# Patient Record
Sex: Female | Born: 1950 | Race: White | Hispanic: No | Smoking: Former smoker
Health system: Southern US, Community
[De-identification: ages and names within clinical notes are randomized; demographics above are authoritative.]

## PROBLEM LIST (undated history)

## (undated) ENCOUNTER — Emergency Department (HOSPITAL_BASED_OUTPATIENT_CLINIC_OR_DEPARTMENT_OTHER): Admission: EM | Payer: 59 | Source: Home / Self Care

## (undated) DIAGNOSIS — R600 Localized edema: Secondary | ICD-10-CM

## (undated) DIAGNOSIS — L02519 Cutaneous abscess of unspecified hand: Secondary | ICD-10-CM

## (undated) DIAGNOSIS — K56609 Unspecified intestinal obstruction, unspecified as to partial versus complete obstruction: Secondary | ICD-10-CM

## (undated) DIAGNOSIS — N289 Disorder of kidney and ureter, unspecified: Secondary | ICD-10-CM

## (undated) DIAGNOSIS — L03019 Cellulitis of unspecified finger: Secondary | ICD-10-CM

## (undated) DIAGNOSIS — F3289 Other specified depressive episodes: Secondary | ICD-10-CM

## (undated) DIAGNOSIS — M109 Gout, unspecified: Secondary | ICD-10-CM

## (undated) DIAGNOSIS — L03119 Cellulitis of unspecified part of limb: Secondary | ICD-10-CM

## (undated) DIAGNOSIS — F329 Major depressive disorder, single episode, unspecified: Secondary | ICD-10-CM

## (undated) DIAGNOSIS — R739 Hyperglycemia, unspecified: Principal | ICD-10-CM

## (undated) DIAGNOSIS — K219 Gastro-esophageal reflux disease without esophagitis: Secondary | ICD-10-CM

## (undated) DIAGNOSIS — L02619 Cutaneous abscess of unspecified foot: Secondary | ICD-10-CM

## (undated) DIAGNOSIS — E669 Obesity, unspecified: Secondary | ICD-10-CM

## (undated) DIAGNOSIS — G4700 Insomnia, unspecified: Secondary | ICD-10-CM

## (undated) DIAGNOSIS — E785 Hyperlipidemia, unspecified: Secondary | ICD-10-CM

## (undated) DIAGNOSIS — M1990 Unspecified osteoarthritis, unspecified site: Secondary | ICD-10-CM

## (undated) DIAGNOSIS — I10 Essential (primary) hypertension: Secondary | ICD-10-CM

## (undated) HISTORY — DX: Cellulitis of unspecified finger: L02.519

## (undated) HISTORY — DX: Disorder of kidney and ureter, unspecified: N28.9

## (undated) HISTORY — DX: Insomnia, unspecified: G47.00

## (undated) HISTORY — DX: Cutaneous abscess of unspecified foot: L02.619

## (undated) HISTORY — DX: Unspecified intestinal obstruction, unspecified as to partial versus complete obstruction: K56.609

## (undated) HISTORY — DX: Cutaneous abscess of unspecified foot: L03.119

## (undated) HISTORY — DX: Gastro-esophageal reflux disease without esophagitis: K21.9

## (undated) HISTORY — DX: Hyperlipidemia, unspecified: E78.5

## (undated) HISTORY — DX: Gout, unspecified: M10.9

## (undated) HISTORY — DX: Localized edema: R60.0

## (undated) HISTORY — DX: Other specified depressive episodes: F32.89

## (undated) HISTORY — DX: Hyperglycemia, unspecified: R73.9

## (undated) HISTORY — DX: Cutaneous abscess of unspecified hand: L03.019

## (undated) HISTORY — DX: Unspecified osteoarthritis, unspecified site: M19.90

## (undated) HISTORY — DX: Major depressive disorder, single episode, unspecified: F32.9

## (undated) HISTORY — DX: Obesity, unspecified: E66.9

---

## 1969-12-23 HISTORY — PX: ECTOPIC PREGNANCY SURGERY: SHX613

## 1975-12-24 HISTORY — PX: CHOLECYSTECTOMY: SHX55

## 1996-12-23 HISTORY — PX: TOTAL HIP ARTHROPLASTY: SHX124

## 2000-12-23 HISTORY — PX: APPENDECTOMY: SHX54

## 2000-12-23 HISTORY — PX: ABDOMINAL HYSTERECTOMY: SHX81

## 2005-12-23 LAB — HM COLONOSCOPY: HM Colonoscopy: NORMAL

## 2006-03-11 ENCOUNTER — Ambulatory Visit: Payer: Self-pay | Admitting: Family Medicine

## 2006-03-14 ENCOUNTER — Encounter: Admission: RE | Admit: 2006-03-14 | Discharge: 2006-03-14 | Payer: Self-pay | Admitting: Family Medicine

## 2006-03-31 ENCOUNTER — Ambulatory Visit: Payer: Self-pay | Admitting: Gastroenterology

## 2006-05-13 ENCOUNTER — Ambulatory Visit: Payer: Self-pay | Admitting: Gastroenterology

## 2006-05-27 ENCOUNTER — Ambulatory Visit: Payer: Self-pay | Admitting: Family Medicine

## 2006-07-12 ENCOUNTER — Encounter: Admission: RE | Admit: 2006-07-12 | Discharge: 2006-07-12 | Payer: Self-pay | Admitting: Family Medicine

## 2007-03-16 ENCOUNTER — Emergency Department (HOSPITAL_COMMUNITY): Admission: EM | Admit: 2007-03-16 | Discharge: 2007-03-16 | Payer: Self-pay | Admitting: Family Medicine

## 2007-06-30 ENCOUNTER — Ambulatory Visit: Payer: Self-pay | Admitting: Family Medicine

## 2007-06-30 LAB — CONVERTED CEMR LAB
ALT: 26 units/L (ref 0–35)
Albumin: 4.1 g/dL (ref 3.5–5.2)
Alkaline Phosphatase: 105 units/L (ref 39–117)
BUN: 23 mg/dL (ref 6–23)
Basophils Absolute: 0 10*3/uL (ref 0.0–0.1)
Basophils Relative: 0.6 % (ref 0.0–1.0)
Bilirubin, Direct: 0.1 mg/dL (ref 0.0–0.3)
Calcium: 9.3 mg/dL (ref 8.4–10.5)
Cholesterol: 150 mg/dL (ref 0–200)
GFR calc non Af Amer: 55 mL/min
HCT: 38.1 % (ref 36.0–46.0)
HDL: 41.7 mg/dL (ref >39.0)
MCHC: 34.7 g/dL (ref 30.0–36.0)
MCV: 89.3 fL (ref 78.0–100.0)
Monocytes Relative: 5.9 % (ref 3.0–11.0)
Neutro Abs: 3.2 10*3/uL (ref 1.4–7.7)
Neutrophils Relative %: 53.9 % (ref 43.0–77.0)
RDW: 12.9 % (ref 11.5–14.6)
Total Bilirubin: 0.8 mg/dL (ref 0.3–1.2)
Total CHOL/HDL Ratio: 3.6
Triglycerides: 144 mg/dL (ref 0–149)
VLDL: 29 mg/dL (ref 0–40)

## 2007-09-04 DIAGNOSIS — F329 Major depressive disorder, single episode, unspecified: Secondary | ICD-10-CM

## 2007-09-04 DIAGNOSIS — I10 Essential (primary) hypertension: Secondary | ICD-10-CM

## 2007-11-10 ENCOUNTER — Telehealth: Payer: Self-pay | Admitting: Family Medicine

## 2008-01-26 ENCOUNTER — Telehealth: Payer: Self-pay | Admitting: Family Medicine

## 2008-02-04 ENCOUNTER — Telehealth: Payer: Self-pay | Admitting: Family Medicine

## 2008-02-09 ENCOUNTER — Telehealth: Payer: Self-pay | Admitting: Family Medicine

## 2008-02-10 ENCOUNTER — Ambulatory Visit: Payer: Self-pay | Admitting: Family Medicine

## 2008-02-10 DIAGNOSIS — R609 Edema, unspecified: Secondary | ICD-10-CM

## 2008-02-17 ENCOUNTER — Ambulatory Visit: Payer: Self-pay | Admitting: Family Medicine

## 2008-02-18 ENCOUNTER — Encounter: Payer: Self-pay | Admitting: Family Medicine

## 2008-02-23 ENCOUNTER — Telehealth: Payer: Self-pay | Admitting: Family Medicine

## 2008-04-27 ENCOUNTER — Telehealth: Payer: Self-pay | Admitting: Family Medicine

## 2008-04-28 ENCOUNTER — Ambulatory Visit: Payer: Self-pay | Admitting: Family Medicine

## 2008-04-28 ENCOUNTER — Telehealth: Payer: Self-pay | Admitting: Family Medicine

## 2008-05-03 LAB — CONVERTED CEMR LAB
Basophils Absolute: 0 10*3/uL (ref 0.0–0.1)
Basophils Relative: 0.4 % (ref 0.0–1.0)
Eosinophils Absolute: 0.1 10*3/uL (ref 0.0–0.7)
Eosinophils Relative: 2.1 % (ref 0.0–5.0)
HCT: 39.6 % (ref 36.0–46.0)
Lymphocytes Relative: 39.8 % (ref 12.0–46.0)
Monocytes Absolute: 0.4 10*3/uL (ref 0.1–1.0)
Neutro Abs: 3.2 10*3/uL (ref 1.4–7.7)
Platelets: 230 10*3/uL (ref 150–400)
WBC: 6.1 10*3/uL (ref 4.5–10.5)

## 2008-05-19 ENCOUNTER — Emergency Department (HOSPITAL_COMMUNITY): Admission: EM | Admit: 2008-05-19 | Discharge: 2008-05-19 | Payer: Self-pay | Admitting: Family Medicine

## 2008-08-17 ENCOUNTER — Telehealth: Payer: Self-pay | Admitting: Family Medicine

## 2008-08-24 ENCOUNTER — Telehealth: Payer: Self-pay | Admitting: Family Medicine

## 2008-10-27 ENCOUNTER — Telehealth: Payer: Self-pay | Admitting: Family Medicine

## 2008-11-24 ENCOUNTER — Telehealth: Payer: Self-pay | Admitting: Family Medicine

## 2009-01-16 ENCOUNTER — Ambulatory Visit: Payer: Self-pay | Admitting: *Deleted

## 2009-01-16 DIAGNOSIS — E785 Hyperlipidemia, unspecified: Secondary | ICD-10-CM | POA: Insufficient documentation

## 2009-01-16 LAB — CONVERTED CEMR LAB
Basophils Absolute: 0.1 10*3/uL (ref 0.0–0.1)
Calcium: 9.5 mg/dL (ref 8.4–10.5)
Creatinine, Ser: 1.1 mg/dL (ref 0.4–1.2)
GFR calc non Af Amer: 54 mL/min
HCT: 38.5 % (ref 36.0–46.0)
MCHC: 34.6 g/dL (ref 30.0–36.0)
Monocytes Absolute: 3.4 10*3/uL — ABNORMAL HIGH (ref 0.1–1.0)
Neutro Abs: 0.6 10*3/uL — ABNORMAL LOW (ref 1.4–7.7)
Platelets: 164 10*3/uL (ref 150–400)
TSH: 1.31 microintl units/mL (ref 0.35–5.50)
Total CHOL/HDL Ratio: 3.5
Total Protein: 7.1 g/dL (ref 6.0–8.3)
Uric Acid, Serum: 9.1 mg/dL — ABNORMAL HIGH (ref 2.4–7.0)
VLDL: 17 mg/dL (ref 0–40)

## 2009-01-20 ENCOUNTER — Ambulatory Visit (HOSPITAL_BASED_OUTPATIENT_CLINIC_OR_DEPARTMENT_OTHER): Admission: RE | Admit: 2009-01-20 | Discharge: 2009-01-20 | Payer: Self-pay | Admitting: *Deleted

## 2009-01-20 ENCOUNTER — Ambulatory Visit: Payer: Self-pay | Admitting: Diagnostic Radiology

## 2009-02-17 ENCOUNTER — Ambulatory Visit: Payer: Self-pay | Admitting: Internal Medicine

## 2009-02-18 ENCOUNTER — Encounter (INDEPENDENT_AMBULATORY_CARE_PROVIDER_SITE_OTHER): Payer: Self-pay | Admitting: *Deleted

## 2009-02-18 LAB — CONVERTED CEMR LAB: Uric Acid, Serum: 8.3 mg/dL — ABNORMAL HIGH (ref 2.4–7.0)

## 2009-02-19 DIAGNOSIS — M1990 Unspecified osteoarthritis, unspecified site: Secondary | ICD-10-CM | POA: Insufficient documentation

## 2009-02-19 DIAGNOSIS — K219 Gastro-esophageal reflux disease without esophagitis: Secondary | ICD-10-CM

## 2009-06-22 ENCOUNTER — Telehealth: Payer: Self-pay | Admitting: Internal Medicine

## 2009-07-11 ENCOUNTER — Ambulatory Visit: Payer: Self-pay | Admitting: Family Medicine

## 2009-07-11 ENCOUNTER — Ambulatory Visit (HOSPITAL_BASED_OUTPATIENT_CLINIC_OR_DEPARTMENT_OTHER): Admission: RE | Admit: 2009-07-11 | Discharge: 2009-07-11 | Payer: Self-pay | Admitting: Family Medicine

## 2009-07-11 ENCOUNTER — Ambulatory Visit: Payer: Self-pay | Admitting: Diagnostic Radiology

## 2009-07-11 DIAGNOSIS — M25559 Pain in unspecified hip: Secondary | ICD-10-CM

## 2009-07-11 DIAGNOSIS — M545 Low back pain: Secondary | ICD-10-CM

## 2009-07-11 DIAGNOSIS — M5136 Other intervertebral disc degeneration, lumbar region: Secondary | ICD-10-CM

## 2009-08-24 ENCOUNTER — Telehealth (INDEPENDENT_AMBULATORY_CARE_PROVIDER_SITE_OTHER): Payer: Self-pay | Admitting: *Deleted

## 2010-02-21 ENCOUNTER — Ambulatory Visit: Payer: Self-pay | Admitting: Family

## 2010-02-21 DIAGNOSIS — M109 Gout, unspecified: Secondary | ICD-10-CM | POA: Insufficient documentation

## 2010-02-22 ENCOUNTER — Ambulatory Visit (HOSPITAL_BASED_OUTPATIENT_CLINIC_OR_DEPARTMENT_OTHER): Admission: RE | Admit: 2010-02-22 | Discharge: 2010-02-22 | Payer: Self-pay | Admitting: Internal Medicine

## 2010-02-22 ENCOUNTER — Ambulatory Visit: Payer: Self-pay | Admitting: Diagnostic Radiology

## 2010-02-22 ENCOUNTER — Ambulatory Visit: Payer: Self-pay | Admitting: Family

## 2010-02-22 LAB — HM MAMMOGRAPHY: HM Mammogram: NEGATIVE

## 2010-02-22 LAB — CONVERTED CEMR LAB
ALT: 30 units/L (ref 0–35)
AST: 26 units/L (ref 0–37)
Albumin: 4.9 g/dL (ref 3.5–5.2)
BUN: 13 mg/dL (ref 6–23)
Basophils Absolute: 0 10*3/uL (ref 0.0–0.1)
Basophils Relative: 1 % (ref 0–1)
Bilirubin, Direct: 0.1 mg/dL (ref 0.0–0.3)
CO2: 24 meq/L (ref 19–32)
Cholesterol: 163 mg/dL (ref 0–200)
Creatinine, Ser: 1.07 mg/dL (ref 0.40–1.20)
Eosinophils Absolute: 0.1 10*3/uL (ref 0.0–0.7)
Hemoglobin: 14 g/dL (ref 12.0–15.0)
LDL Cholesterol: 90 mg/dL (ref 0–99)
Lymphocytes Relative: 35 % (ref 12–46)
Lymphs Abs: 2 10*3/uL (ref 0.7–4.0)
MCHC: 33.4 g/dL (ref 30.0–36.0)
MCV: 93.1 fL (ref 78.0–100.0)
Monocytes Absolute: 0.3 10*3/uL (ref 0.1–1.0)
Neutrophils Relative %: 57 % (ref 43–77)
RBC: 4.5 M/uL (ref 3.87–5.11)
Total Bilirubin: 0.6 mg/dL (ref 0.3–1.2)
Total Protein: 7.3 g/dL (ref 6.0–8.3)
Uric Acid, Serum: 7.9 mg/dL — ABNORMAL HIGH (ref 2.4–7.0)

## 2010-02-23 ENCOUNTER — Telehealth: Payer: Self-pay | Admitting: Family

## 2010-02-26 ENCOUNTER — Telehealth (INDEPENDENT_AMBULATORY_CARE_PROVIDER_SITE_OTHER): Payer: Self-pay | Admitting: *Deleted

## 2010-03-07 ENCOUNTER — Ambulatory Visit: Payer: Self-pay | Admitting: Family

## 2010-03-07 LAB — CONVERTED CEMR LAB
CO2: 22 meq/L (ref 19–32)
Chloride: 110 meq/L (ref 96–112)
Creatinine, Ser: 1.7 mg/dL — ABNORMAL HIGH (ref 0.40–1.20)
Sodium: 144 meq/L (ref 135–145)

## 2010-03-08 ENCOUNTER — Telehealth: Payer: Self-pay | Admitting: Family

## 2010-03-15 ENCOUNTER — Telehealth: Payer: Self-pay | Admitting: Family

## 2010-03-15 ENCOUNTER — Encounter: Payer: Self-pay | Admitting: Family

## 2010-03-16 ENCOUNTER — Ambulatory Visit: Payer: Self-pay

## 2010-03-16 ENCOUNTER — Ambulatory Visit: Payer: Self-pay | Admitting: Family

## 2010-03-16 LAB — CONVERTED CEMR LAB
BUN: 21 mg/dL (ref 6–23)
CO2: 27 meq/L (ref 19–32)
Chloride: 109 meq/L (ref 96–112)
GFR calc non Af Amer: 60.22 mL/min (ref >60)
Glucose, Bld: 96 mg/dL (ref 70–99)
Sodium: 146 meq/L — ABNORMAL HIGH (ref 135–145)

## 2010-03-21 ENCOUNTER — Ambulatory Visit: Payer: Self-pay | Admitting: Family

## 2010-04-20 ENCOUNTER — Ambulatory Visit: Payer: Self-pay | Admitting: Family

## 2010-04-24 ENCOUNTER — Encounter: Payer: Self-pay | Admitting: Internal Medicine

## 2010-05-12 ENCOUNTER — Ambulatory Visit (HOSPITAL_BASED_OUTPATIENT_CLINIC_OR_DEPARTMENT_OTHER): Admission: RE | Admit: 2010-05-12 | Discharge: 2010-05-12 | Payer: Self-pay | Admitting: Orthopaedic Surgery

## 2010-05-12 ENCOUNTER — Ambulatory Visit: Payer: Self-pay | Admitting: Diagnostic Radiology

## 2010-05-28 ENCOUNTER — Emergency Department (HOSPITAL_COMMUNITY): Admission: EM | Admit: 2010-05-28 | Discharge: 2010-05-28 | Payer: Self-pay | Admitting: Family Medicine

## 2010-06-07 ENCOUNTER — Ambulatory Visit (HOSPITAL_BASED_OUTPATIENT_CLINIC_OR_DEPARTMENT_OTHER): Admission: RE | Admit: 2010-06-07 | Discharge: 2010-06-07 | Payer: Self-pay | Admitting: Orthopaedic Surgery

## 2010-06-07 HISTORY — PX: OTHER SURGICAL HISTORY: SHX169

## 2010-06-19 ENCOUNTER — Encounter: Admission: RE | Admit: 2010-06-19 | Discharge: 2010-08-20 | Payer: Self-pay | Admitting: Orthopaedic Surgery

## 2010-07-09 ENCOUNTER — Ambulatory Visit: Payer: Self-pay | Admitting: Family

## 2010-10-23 ENCOUNTER — Ambulatory Visit: Payer: Self-pay | Admitting: Family

## 2010-10-23 LAB — CONVERTED CEMR LAB
Calcium: 10.3 mg/dL (ref 8.4–10.5)
Chloride: 102 meq/L (ref 96–112)
Creatinine, Ser: 1.14 mg/dL (ref 0.40–1.20)
Sodium: 141 meq/L (ref 135–145)

## 2010-10-24 ENCOUNTER — Encounter: Payer: Self-pay | Admitting: Family

## 2011-01-13 ENCOUNTER — Encounter: Payer: Self-pay | Admitting: Internal Medicine

## 2011-01-22 NOTE — Miscellaneous (Signed)
Summary: Orders Update  Clinical Lists Changes  Orders: Added new Test order of Renal Artery Duplex (Renal Artery Duplex) - Signed 

## 2011-01-22 NOTE — Progress Notes (Signed)
Summary: lab result & appt.  Phone Note Outgoing Call   Summary of Call: Pls call patient and let her know that her uric acid level is still high.  I would like her to increase allopurinol to 300mg  by mouth two times a day.  Her lasix may also be contributing to her elevated levels.  I would like her to stop the daily lasix and only use 1/2 tab daily as needed swelling.  For her blood pressure I would like her to start lisinopril 10mg  by mouth daily and return in 2 weeks for a  BMET(401.9)- lab draw and nurse visit for BP only please.  Instruct pt to stop lisinopril and call if in rare chance she develops tongue or lip swelling. She should keep upcoming 4/6 appointment. Initial call taken by: [name] FNP,  February 25, 2010 10:11 PM  Follow-up for Phone Call        Pt. advised of med changes / additions. Appts made for labwork on 3/18 @ 2pm.  Nurse visit--bp check 3/18 @ 2:15.  Follow-up by: Mervin Kung CMA,  February 26, 2010 10:47 AM    New/Updated Medications: FUROSEMIDE 40 MG  TABS (FUROSEMIDE) Take 1/2 tablet by mouth daily as needed for swelling ALLOPURINOL 300 MG TABS (ALLOPURINOL) one tablet by mouth bid LISINOPRIL 10 MG TABS (LISINOPRIL) one tablet by mouth daily Prescriptions: LISINOPRIL 10 MG TABS (LISINOPRIL) one tablet by mouth daily  #30 x 0   Entered and Authorized by:   [name] FNP   Signed by:   [name] FNP on 02/25/2010   Method used:   Electronically to        [facility] [phone]* (retail)       [address]       [place], [place]  [address]       Ph: [phone]       Fax: [phone]   RxID:   [id number]

## 2011-01-22 NOTE — Letter (Signed)
   [place] [facility] [address] [place], [place]  [address]  [place] Phone: [phone]      October 24, 2010   [facility] [address] [place], [place] [address]  RE:  LAB RESULTS  Dear  Ms. Vassar,  The following is an interpretation of your most recent lab tests.  Please take note of any instructions provided or changes to medications that have resulted from your lab work.  ELECTROLYTES:  Good - no changes needed  KIDNEY FUNCTION TESTS:  Good - no changes needed     Sincerely Yours,    [name] FNP  Appended Document:  mailed

## 2011-01-22 NOTE — Miscellaneous (Signed)
Summary: Orders Update  Clinical Lists Changes  Orders: Added new Test order of TLB-BMP (Basic Metabolic Panel-BMET) (80048-METABOL) - Signed 

## 2011-01-22 NOTE — Assessment & Plan Note (Signed)
Summary: follow up / tf,cma   Vital Signs:  Patient profile:   60 year old female Height:      63.5 inches Weight:      268.05 pounds BMI:     46.91 Temp:     97.0 degrees F oral Pulse rate:   66 / minute Pulse rhythm:   regular BP sitting:   130 / 70  (left arm) Cuff size:   large  Vitals Entered By: Mervin Kung CMA (March 21, 2010 10:10 AM) CC: room 17  Follow up of ultrasound and labs. Pt. states she did have to take some of her fluid pills while she was on vacation due to the swelling.   Primary Care Provider:  Paulo Fruit MD  CC:  room 17  Follow up of ultrasound and labs. Pt. states she did have to take some of her fluid pills while she was on vacation due to the swelling.Marland Kitchen  History of Present Illness: Jordan [name] is a 60 year old female who presents today for follow up of Acute renal insufficiency.  Notes that she has been needing to use the furosemide as needed for swelling.  She has discontinued the NSAIDS, but remains off of her blood pressure medication.  Notes that she has some discomfort in her right shoulder which she attributes positioning at her desk while at work.    Allergies (verified): No Known Drug Allergies  Physical Exam  General:  Well-developed,well-nourished,in no acute distress; alert,appropriate and cooperative throughout examination Lungs:  Normal respiratory effort, chest expands symmetrically. Lungs are clear to auscultation, no crackles or wheezes. Heart:  Normal rate and regular rhythm. S1 and S2 normal without gallop, murmur, click, rub or other extra sounds. Extremities:  1+ bilateral LE swelling   Impression & Recommendations:  Problem # 1:  RENAL INSUFFICIENCY, ACUTE (ICD-585.9) Assessment Comment Only Reviewed Renal ultrasound results- negative for renal artery stenosis.   It did note some low amplitude flow in the kidneys.  I reviewed these results by phone with Dr. Charlton Haws.  He notes that this may be consistent with medical  renal disease.  Follow up creatinine last week was normal.  Pt instructed to stay off of NSAIDS- recommended as needed tylenol.    Problem # 2:  HYPERTENSION (ICD-401.9) Assessment: Improved Patient held ACE in setting of ARI and remains off ACE.  BP is actually improved this visit.  Plan to keep patient off of ACE, follow up in 1 month for repeat BP check.   BP today: 130/70 Prior BP: 130/86 (03/07/2010)  Labs Reviewed: K+: 4.5 (03/16/2010) Creat: : 1.0 (03/16/2010)   Chol: 163 (02/22/2010)   HDL: 42 (02/22/2010)   LDL: 90 (02/22/2010)   TG: 153 (02/22/2010)  Her updated medication list for this problem includes:    Furosemide 40 Mg Tabs (Furosemide) ..... One tablet by mouth daily as needed for swelling  Complete Medication List: 1)  Pepcid Ac 10 Mg Tabs (Famotidine) .... One tab by mouth once daily as needed 2)  Trazodone Hcl 50 Mg Tabs (Trazodone hcl) .Marland Kitchen.. 1-2 tabs by mouth at bedtime as needed 3)  Aspirin 81 Mg Tbec (Aspirin) .... Once daily 4)  Allopurinol 300 Mg Tabs (Allopurinol) .... One half tablet by mouth two times a day 5)  One-a-day Womens Formula Tabs (Multiple vitamins-calcium) .... Take 1 tablet by mouth once a day 6)  Furosemide 40 Mg Tabs (Furosemide) .... One tablet by mouth daily as needed for swelling  Patient Instructions: 1)  Avoid anti-inflammatories. (ibuprofen, aleve etc.) You may use tylenol as needed. 2)  Stay off of lisinopril. 3)  Follow up in 1 month  Current Allergies (reviewed today): No known allergies

## 2011-01-22 NOTE — Progress Notes (Signed)
  Phone Note Call from Patient   Caller: Patient Details for Reason: Appt  resch'd Summary of Call: Pt has appt March  25 @  10am for her renal doppler, she also had appt with you tomorrow @  8:15am  she want to resch the appt @ [facility] with you , appt now is   March 30th . Initial call taken by: Darral Dash,  March 15, 2010 9:01 AM  Follow-up for Phone Call        It is ok if she has appointment later, but she needs a BMET drawn today or tomorrow to follow up on her renal function.  Please advise patient. (585.9) Thanks Follow-up by: [name] FNP,  March 15, 2010 1:06 PM  Additional Follow-up for Phone Call Additional follow up Details #1::        Pt aware that she needs to have bmet by tomorrow to follow up on kidney functions. She states she will go to [place] office after doppler. Mervin Kung CMA  March 15, 2010 1:59 PM

## 2011-01-22 NOTE — Progress Notes (Signed)
Summary: med change and labs question  Phone Note Call from Patient   Caller: Mom Summary of Call: please call pt. in Re: Question about her change of med. and her lab work , Call [phone] Initial call taken by: Michaelle Copas,  February 26, 2010 12:00 PM  Follow-up for Phone Call        Spoke with pt. and notified her of the uric acid level.  Pt questioned concerns of the med possibly effecting her liver. Per Melissa, monitoring of liver functions is done with initial treatment or if pt. has existing liver disease.  Pt. voices understanding.  Follow-up by: Mervin Kung CMA,  February 26, 2010 4:45 PM

## 2011-01-22 NOTE — Assessment & Plan Note (Signed)
Summary: 3 month follow up/mhf-- Rm 4   Vital Signs:  Patient profile:   60 year old female Height:      63.5 inches Weight:      270.50 pounds BMI:     47.34 Temp:     98.1 degrees F oral Pulse rate:   84 / minute Pulse rhythm:   regular Resp:     16 per minute BP sitting:   122 / 84  (left arm) Cuff size:   large  Vitals Entered By: Mervin Kung CMA Duncan Dull) (July 09, 2010 3:29 PM)   Primary Care Provider:  [name] FNP   History of Present Illness: Jordan [name] is a 60 year old female who presents today for follow up.  1) R RTC repair-  Had repair in early June with Dr. Cleophas Dunker. she continues PT  2) Gout-  had episode of gout prior to surgery in June- lasted 4 days.  She continues Allopurinol.  3) HTN- stable off of meds.  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 Lungs are clear to auscultation, no crackles or wheezes. Heart:  Normal rate and regular rhythm. S1 and S2 normal without gallop, murmur, click, rub or other extra sounds. Extremities:  trace left pedal edema and trace right pedal edema.     Impression & Recommendations:  Problem # 1:  LEG EDEMA, CHRONIC (ICD-782.3) Assessment Improved Continue furosemide as needed. Her updated medication list for this problem includes:    Furosemide 40 Mg Tabs (Furosemide) ..... One tablet by mouth daily as needed for swelling  Problem # 2:  GOUT (ICD-274.9) Assessment:  Unchanged Stable, continue allopurinol Her updated medication list for this problem includes:    Allopurinol 300 Mg Tabs (Allopurinol) ..... One half tablet by mouth two times a day  Problem # 3:  HYPERTENSION (ICD-401.9) Assessment: Improved Stable, only using as needed furosemide for swelling. Her updated medication list for this problem includes:    Furosemide 40 Mg Tabs (Furosemide) ..... One tablet by mouth daily as needed for swelling  BP today: 122/84 Prior BP: 150/90 (04/20/2010)  Labs Reviewed: K+: 4.5 (03/16/2010) Creat: : 1.0 (03/16/2010)   Chol: 163 (02/22/2010)   HDL: 42 (02/22/2010)   LDL: 90 (02/22/2010)   TG: 153 (02/22/2010)  Complete Medication List: 1)  Pepcid Ac 10 Mg Tabs (Famotidine) .... One tab by mouth once daily as needed 2)  Trazodone Hcl 50 Mg Tabs (Trazodone hcl) .Marland Kitchen.. 1-2 tabs by mouth at bedtime as needed 3)  Aspirin 81 Mg Tbec (Aspirin) .... Once daily 4)  Allopurinol 300 Mg Tabs (Allopurinol) .... One half tablet by mouth two times a day 5)  One-a-day Womens Formula Tabs (Multiple vitamins-calcium) .... Take 1 tablet by mouth once a day 6)  Furosemide 40 Mg Tabs (Furosemide) .... One tablet by mouth daily as needed for swelling  Patient Instructions: 1)  Please schedule a follow-up appointment in 3 months. Prescriptions: FUROSEMIDE 40 MG TABS (FUROSEMIDE) one tablet by mouth daily as needed for swelling  #  30 x 2   Entered and Authorized by:   [name] FNP   Signed by:   [name] FNP on 07/09/2010   Method used:   Electronically to        [facility]. [phone]* (retail)       [address]       [place], [place]  [address]       Ph: [phone]       Fax: [phone]   RxID:   [id number] ALLOPURINOL 300 MG TABS (ALLOPURINOL) one half tablet by mouth two times a day  #30 x 2   Entered and Authorized by:   [name] FNP   Signed by:   [name] FNP on 07/09/2010   Method used:    Electronically to        [facility]. [phone]* (retail)       [address]       [place], [place]  [address]       Ph: [phone]       Fax: [phone]   RxID:   [id number] TRAZODONE HCL 50 MG TABS (TRAZODONE HCL) 1-2 tabs by mouth at bedtime as needed  #60 Each x 2   Entered and Authorized by:   [name] FNP   Signed by:   [name] FNP on 07/09/2010   Method used:   Electronically to        [facility]. [phone]* (retail)       [address]       [place], [place]  [address]       Ph: [phone]       Fax: [phone]   RxID:   [id number]   Current Allergies (reviewed today): No known allergies    Vital Signs:  Patient Profile:   60 year old female Height:     63.5 inches Weight:      270.50 pounds BMI:     47.34 Temp:     98.1 degrees F oral Pulse rate:   84 / minute Pulse rhythm:   regular Resp:     16 per minute BP sitting:   122 / 84 Cuff size:   large

## 2011-01-22 NOTE — Assessment & Plan Note (Signed)
Summary: nurse visit  bp check only/tf   Vital Signs:  Patient profile:   60 year old female O2 Sat:      98 % on Room air Temp:     98.7 degrees F oral Pulse rate:   86 / minute Pulse rhythm:   regular Resp:     18 per minute BP sitting:   130 / 86  (right arm) Cuff size:   large  O2 Flow:  Room air CC: Room 5  Pt here for Blood pressure check. States she feels bad since stopping the Lasix.   Primary Care Provider:  Paulo Fruit MD  CC:  Room 5  Pt here for Blood pressure check. States she feels bad since stopping the Lasix.Marland Kitchen  History of Present Illness: Ms Hopes is a 60 year old female who presents today for follow up.  Notes that since she stopped lasix, she has had increased swelling in her lower extremities.    Allergies (verified): No Known Drug Allergies  Physical Exam  General:  morbidly obese white female in NAD Lungs:  Normal respiratory effort, chest expands symmetrically. Lungs are clear to auscultation, no crackles or wheezes. Heart:  Normal rate and regular rhythm. S1 and S2 normal without gallop, murmur, click, rub or other extra sounds. Extremities:  2+bilateral lower exteremity edema   Impression & Recommendations:  Problem # 1:  LEG EDEMA, CHRONIC (ICD-782.3) Assessment Deteriorated Will add back furosemide as patient has not tolerated discontinuation of this med.   The following medications were removed from the medication list:    Furosemide 40 Mg Tabs (Furosemide) .Marland Kitchen... Take 1/2 tablet by mouth daily as needed for swelling Her updated medication list for this problem includes:    Furosemide 40 Mg Tabs (Furosemide) ..... One tab by mouth daily  Problem # 2:  GOUT (ICD-274.9) Assessment: Unchanged  Pt continues to have some mild discomfort in the bases of both of her thumbs.  Unfortunately, she did not tolerate discontinuation of diuretic.  Last uric acid level was 7.9, increased allopurinol.  Will repeat today.  If no improvement will consider  addition of daily colchicine. Her updated medication list for this problem includes:    Allopurinol 300 Mg Tabs (Allopurinol) ..... One tablet by mouth bid  Orders: T-Uric Acid (Blood) ([address])  Problem # 3:  HYPERTENSION (ICD-401.9) Assessment: Improved Continue ACE The following medications were removed from the medication list:    Furosemide 40 Mg Tabs (Furosemide) .Marland Kitchen... Take 1/2 tablet by mouth daily as needed for swelling Her updated medication list for this problem includes:    Lisinopril 10 Mg Tabs (Lisinopril) ..... One tablet by mouth daily    Furosemide 40 Mg Tabs (Furosemide) ..... One tab by mouth daily  BP today: 130/86 Prior BP: 130/90 (02/21/2010)  Labs Reviewed: K+: 4.6 (02/22/2010) Creat: : 1.07 (02/22/2010)   Chol: 163 (02/22/2010)   HDL: 42 (02/22/2010)   LDL: 90 (02/22/2010)   TG: 153 (02/22/2010)  Complete Medication List: 1)  Pepcid Ac 10 Mg Tabs (Famotidine) .... One tab by mouth once daily as needed 2)  Trazodone Hcl 50 Mg Tabs (Trazodone hcl) .Marland Kitchen.. 1-2 tabs by mouth at bedtime as needed 3)  Aspirin 81 Mg Tbec (Aspirin) .... Once daily 4)  Allopurinol 300 Mg Tabs (Allopurinol) .... One tablet by mouth bid 5)  Transderm-scop 1.5 Mg Pt72 (Scopolamine base) .... Apply patch behind ear 4 hours prior to cruise. change in 72 hours 6)  Lisinopril 10 Mg Tabs (Lisinopril) .Marland KitchenMarland KitchenMarland Kitchen  One tablet by mouth daily 7)  One-a-day Womens Formula Tabs (Multiple vitamins-calcium) .... Take 1 tablet by mouth once a day 8)  Furosemide 40 Mg Tabs (Furosemide) .... One tab by mouth daily  Patient Instructions: 1)  Please follow up in 3 months, sooner if problems or concerns.  Current Allergies (reviewed today): No known allergies

## 2011-01-22 NOTE — Progress Notes (Signed)
Summary: appt  Phone Note Outgoing Call   Call placed by: [name] FNP,  March 08, 2010 5:16 PM Summary of Call: Left message for patient to return phone call.  When pt returns call I will advise her to stop lisinopril,  hold lasix,  reduce allopurinol due to renal insufficiency.  Need to verify if pt is experiencing nausea or vomitting- and see if she is taking any NSAIDS (motrin, aleve etc.)  Will plan to refer for a renal artery doppler and repeat BMET tomorrow or monday. Case discussed with Dr. Artist Pais. Initial call taken by: [name] FNP,  March 08, 2010 5:21 PM  Follow-up for Phone Call        late entry- spoke to patient last night at 5:30 PM.  She tells me that she has been taking nsaids 4-5x a day.  Instructed her to d/c nsaids and switch to tylenol.  Med instructions per my previous note.  Pt is going out of town, will be back wednesday night.  Pt advised to seek medical attention if she develops nausea or vomitting.   Additional Follow-up for Phone Call Additional follow up Details #1::        Pls arrange apt for Ms Polito next thursday (late morning or late afternoon- and leave message with time on her home phone number. Additional Follow-up by: [name] FNP,  March 09, 2010 12:33 PM  New Problems: RENAL INSUFFICIENCY, ACUTE (ICD-585.9)   Additional Follow-up for Phone Call Additional follow up Details #2::    Spoke to pt. @1 :35pm and notified her of appt. with Jamas Jaquay on 03/16/10 @ 8:15 at the [facility] office. Follow-up by: Mervin Kung CMA,  March 09, 2010 1:36 PM  New Problems: RENAL INSUFFICIENCY, ACUTE (ICD-585.9) New/Updated Medications: ALLOPURINOL 300 MG TABS (ALLOPURINOL) one half tablet by mouth two times a day

## 2011-01-22 NOTE — Consult Note (Signed)
Summary: [facility]  [facility]   Imported By: Lanelle Bal 05/02/2010 12:14:54  _____________________________________________________________________  External Attachment:    Type:   Image     Comment:   External Document

## 2011-01-22 NOTE — Assessment & Plan Note (Signed)
Summary: CPX/HEA   Vital Signs:  Patient profile:   60 year old female Weight:      268 pounds BMI:     46.90 O2 Sat:      97 % on Room air Temp:     97.9 degrees F oral Pulse rate:   70 / minute Pulse rhythm:   regular Resp:     16 per minute BP sitting:   130 / 90  (left arm) Cuff size:   large  Vitals Entered By: Mervin Kung CMA (February 21, 2010 2:58 PM)  O2 Flow:  Room air CC: room 5  Needs annual physical Is Patient Diabetic? No Comments Needs refills on:  Furosemide, Allopurinol and Trazodone.   Primary Care Provider:  Paulo Fruit MD  CC:  room 5  Needs annual physical.  History of Present Illness: Jordan [name] is a 60 year old female who presents today for a complete physical.  Gout- Notes + pain at base of both thumbs- this has been present x 3 months. She has been taking allopurinol regularly- 300 in the AM 150 in the PM.  HTN- Tells me that she has not been taking dyazide- has been taking fuosemide instead  GERD- takes pepcid AC daily as needed.  Hyperlipidemia-  tells me she has been working on diet.  Depression-  has been using trazadone at night for sleep.  This has been working well for her sleep and depression.    Preventative- + exercise daily- she does Margie Billet daily- uses skype and long distance family members log on and participate with her.  S/p complete hysterectomy- no pap.  Needs mammogram.   Preventive Screening-Counseling & Management  Alcohol-Tobacco     Smoking Status: quit  Allergies (verified): No Known Drug Allergies  Past History:  Past Medical History: Last updated: 02/17/2009 GOUT, ACUTE (ICD-274.9) CELLULITIS, FOOT, RIGHT (ICD-682.7) OBESITY, UNSPECIFIED (ICD-278.00) CHRONIC LEG EDEMA CELLULITIS, FINGER (ICD-681.00) HYPERTENSION (ICD-401.9) DEPRESSION (ICD-311) Insomnia Hyperlipidemia - borderline - not on meds GERD Osteoarthritis  Past Surgical History: Last updated: 01/16/2009 Appendectomy   2002 Cholecystectomy  1977 Total hip replacement  1998 tubal pregnancy  1971 Hysterectomy  2002 - infection - no history of cancer  Family History: Last updated: 02/21/2010 DM-mother HTN-father colon cancer-Mother pancreatic cancer-mother deceased Dad is deceased- cirrhosis, ETOH 5 brothers- one brother deceased at age 65 due to accident, 4 living brothers- some ETOH abuse 5 sisters- all living, one sister with pyeoderma gangrenosis, DM2 2 children-  ages 60(son) and 33(daughter)-  both children healthy, daughter is overweight.    Social History: Last updated: 02/21/2010 Occupation: [employer] in [employer] Divorced 2 children quit smoking 5 years ago Alcohol use-yes (rare ETOH)  Risk Factors: Alcohol Use: 0 (01/16/2009) Caffeine Use: 3 - advised to cut back (01/16/2009) Exercise: no (01/16/2009)  Risk Factors: Smoking Status: quit (02/21/2010)  Family History: DM-mother HTN-father colon cancer-Mother pancreatic cancer-mother deceased Dad is deceased- cirrhosis, ETOH 5 brothers- one brother deceased at age 26 due to accident, 4 living brothers- some ETOH abuse 5 sisters- all living, one sister with pyeoderma gangrenosis, DM2 2 children-  ages 37(son) and 33(daughter)-  both children healthy, daughter is overweight.    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 No apparent alopecia or balding. Eyes:  PERRLA Ears:  External ear exam shows no significant lesions or deformities.  Otoscopic examination reveals clear canals, tympanic membranes are intact bilaterally without bulging, retraction, inflammation or discharge. Hearing is grossly normal bilaterally. Mouth:  Oral mucosa and oropharynx without lesions or exudates.  Teeth in good repair. Neck:  No deformities, masses, or tenderness noted. Chest Wall:  No deformities, masses, or tenderness noted. Breasts:  No mass, nodules, thickening, tenderness, bulging, retraction, inflamation, nipple discharge or skin changes noted.  Bilateral inverted nipples Lungs:  Normal respiratory effort, chest expands symmetrically. Lungs are clear to auscultation, no crackles or wheezes. Heart:  Normal rate and regular rhythm. S1 and S2 normal without gallop, murmur, click, rub or other extra sounds. Abdomen:  Bowel sounds positive,abdomen soft and non-tender without masses, organomegaly or hernias noted. Genitalia:  deferred (s/p TAH/BSO) Msk:  No deformity or scoliosis noted of thoracic or lumbar spine.   Extremities:  No clubbing, cyanosis, edema, or deformity noted with normal full range of motion of all joints.   Neurologic:  alert & oriented X3, cranial nerves II-XII intact, strength normal in all extremities, gait normal, and DTRs symmetrical and normal (had difficulty getting right patellar reflex) Skin:  Intact without suspicious lesions or rashes Cervical Nodes:  No lymphadenopathy noted Axillary Nodes:  No palpable lymphadenopathy Psych:  Cognition and judgment appear intact. Alert and cooperative with normal attention span and concentration. No apparent delusions, illusions, hallucinations   Impression & Recommendations:  Problem # 1:  WELL ADULT (ICD-V70.0) Assessment Comment Only EKG NSR  61-  immunizations reviewed and up to date.  Refer for mammogram. No pap due to TAH/BSO.  Patient exercises regularly, counselled on diet and weight loss.   Orders: Mammogram (Screening) (Mammo) EKG w/ Interpretation (93000)  Problem # 2:  HYPERTENSION (ICD-401.9) Assessment: New DBP slightly elevated, will plan to repeat in 1 month, continue low sodium diet/exercise/weight loss The following medications were removed from the medication list:    Dyazide 37.5-25 Mg Caps (Triamterene-hctz) .Marland Kitchen... Take 1 tablet by mouth once a day Her updated medication list for this problem includes:    Furosemide 40 Mg Tabs (Furosemide) .Marland Kitchen... Take 1 tablet by mouth once a day  BP today: 130/90 Prior BP: 124/80 (07/11/2009)  Labs Reviewed: K+: 4.3 (01/16/2009) Creat: : 1.1 (01/16/2009)   Chol: 137 (01/16/2009)   HDL: 39.7 (01/16/2009)   LDL: 81 (01/16/2009)   TG: 84 (01/16/2009)  Problem # 3:  GERD (ICD-530.81) Assessment: Unchanged GERD symtoms stable with pepcid, continue same.   Her updated medication list for this problem includes:    Pepcid Ac 10 Mg Tabs (Famotidine) ..... One tab by mouth once daily as needed  Problem # 4:  GOUT (ICD-274.9)  Her updated medication list for this problem includes:    Allopurinol 300 Mg Tabs (Allopurinol) ..... One tab by mouth in the am and 1/2 tab in the pm  Problem # 5:  HYPERLIPIDEMIA (ICD-272.4) Assessment: Comment Only Will check FLP  Problem # 6:  DEPRESSION (ICD-311) Assessment: Improved Stable,  sleeping well, continue trazodone Her updated medication list for this problem includes:    Trazodone Hcl 50 Mg Tabs (Trazodone hcl) .Marland Kitchen... 1-2 tabs by mouth at bedtime as needed  Complete Medication List: 1)  Furosemide 40 Mg Tabs (Furosemide) .... Take 1 tablet by mouth once a day 2)  Pepcid Ac 10 Mg Tabs (Famotidine) .... One tab by mouth once daily as needed 3)  Trazodone Hcl 50 Mg Tabs (Trazodone hcl) .Marland Kitchen.. 1-2 tabs by mouth at bedtime as needed 4)   Aspirin 81 Mg Tbec (Aspirin) .... Once daily 5)  Allopurinol 300 Mg Tabs (Allopurinol) .... One tab by mouth in the am and 1/2 tab in the pm 6)  Transderm-scop 1.5 Mg Pt72 (Scopolamine base) .... Apply patch behind ear 4 hours prior to cruise. change in 72 hours  Patient Instructions: 1)  Please return fasting for the following labs: 2)  CBC, BMET, TSH, LFT, FLP (v70) 3)  Uric acid (274.9) 4)  Keep up the good work with your exercise. 5)  It was a pleasure to meet you. 6)  Please follow up in 1 month for BP check and follow up of your gout.  Sooner if problems Prescriptions: ALLOPURINOL 300 MG TABS (ALLOPURINOL) one tab by mouth in the am and 1/2 tab in the pm  #45 x 3   Entered and Authorized by:   [name] FNP   Signed by:   [name] FNP on 02/21/2010   Method used:   Electronically to        [facility] [phone]* (retail)       [address]       [place], [place]  [address]       Ph: [phone]       Fax: [phone]   RxID:   [id number] TRAZODONE HCL 50 MG TABS (TRAZODONE HCL) 1-2 tabs by mouth at bedtime as needed  #60 x 0   Entered and Authorized by:   [name] FNP   Signed by:   [name] FNP on 02/21/2010   Method used:   Electronically to        [facility] [phone]* (retail)       [address]       [place], [place]  [address]       Ph: [phone]       Fax: [phone]   RxID:   [id number] FUROSEMIDE 40 MG  TABS (FUROSEMIDE) Take 1 tablet by mouth once a day  #30 x 3   Entered and Authorized by:   [name] FNP   Signed by:   [name] FNP on 02/21/2010   Method used:   Electronically to        [facility] [phone]* (retail)       [address]       [place], [place]  [address]       Ph: [phone]       Fax: [phone]   RxID:   [id number] TRANSDERM-SCOP 1.5 MG PT72 (SCOPOLAMINE BASE) apply patch behind ear 4 hours prior to cruise. Change in 72 hours  #2 x 0   Entered  and Authorized by:   [name] FNP   Signed by:   [name] FNP on 02/21/2010   Method used:   Electronically to        [facility] [phone]* (retail)       [address]       [place],  [place]  [address]       Ph: [phone]       Fax: [phone]   RxID:   [id number]   Current Allergies (reviewed today): No known allergies    Immunization History:  Influenza Immunization History:    Influenza:  historical (10/12/2009)    Preventive Care Screening  Mammogram:    Date:  12/26/2008    Results:  normal

## 2011-01-22 NOTE — Assessment & Plan Note (Signed)
Summary: one mth fu/kdc   Vital Signs:  Patient profile:   60 year old female Height:      63.5 inches Weight:      276.50 pounds BMI:     48.39 Temp:     9.7 degrees F oral Pulse rate:   60 / minute Pulse rhythm:   regular Resp:     16 per minute BP sitting:   150 / 90  (right arm) Cuff size:   thigh  Vitals Entered By: Jordan [name] CMA (April 20, 2010 10:36 AM) CC: room 4  1 month follow up.   Is Patient Diabetic? No   Primary Care Provider:  Paulo Fruit MD  CC:  room 4  1 month follow up.  Marland Kitchen  History of Present Illness: Jordan [name] is a 60 year old female who presents today for follow up of her HTN.  Tells me that she has only been using diuretic on the weekends.  Weight is up, BP is up.    Gout-  Denies gout symptoms, but notes that her right should pain has deteriorated.  Had to use ibuprofen on Tuesday night.    Allergies (verified): No Known Drug Allergies  Physical Exam  General:  Well-developed,well-nourished,in no acute distress; alert,appropriate and cooperative throughout examination Neck:  No deformities, masses, or tenderness noted. Lungs:  Normal respiratory effort, chest expands symmetrically. Lungs are clear to auscultation, no crackles or wheezes. Heart:  Normal rate and regular rhythm. S1 and S2 normal without gallop, murmur, click, rub or other extra sounds. Msk:  Full ROM right arm.  Increased pain with posterior extension of right arm.  Extremities:  trace lower extremity edema   Impression & Recommendations:  Problem # 1:  HYPERTENSION (ICD-401.9) Assessment New Patient's blood pressure was previously stable when she was taking daily furosemide.  She wishes to resume furosemide due to fluid retention and chronic lower extremity swelling.  We had tried to scale back on diuretics due to gout, but she has not tolerated holding the furosemide.  Plan to have patient return in 2 weeks for nurse visit/bp check.  If BP not in range plan to add  Bystolic at that time.   Her updated medication list for this problem includes:    Furosemide 40 Mg Tabs (Furosemide) ..... One tablet by mouth daily as needed for swelling  Problem # 2:  SHOULDER PAIN, RIGHT (ICD-719.41) Assessment: Deteriorated Will refer to orthopedics (Dr. Cleophas Dunker) for further evaluation Her updated medication list for this problem includes:    Aspirin 81 Mg Tbec (Aspirin) ..... Once daily  Orders: Orthopedic Referral (Ortho)  Complete Medication List: 1)  Pepcid Ac 10 Mg Tabs (Famotidine) .... One tab by mouth once daily as needed 2)  Trazodone Hcl 50 Mg Tabs (Trazodone hcl) .Marland Kitchen.. 1-2 tabs by mouth at bedtime as needed 3)  Aspirin 81 Mg Tbec (Aspirin) .... Once daily 4)  Allopurinol 300 Mg Tabs (Allopurinol) .... One half tablet by mouth two times a day 5)  One-a-day Womens Formula Tabs (Multiple vitamins-calcium) .... Take 1 tablet by mouth once a day 6)  Furosemide 40 Mg Tabs (Furosemide) .... One tablet by mouth daily as needed for swelling  Patient Instructions: 1)  Please resume the furosemide, follow up in 2 weeks for nurse visit (BP check). 2)  You will be called about your referral to orthopedics.  3)  Follow up in 3 months  Current Allergies (reviewed today): No known allergies

## 2011-01-22 NOTE — Assessment & Plan Note (Signed)
Summary: 3 MONTH FOLLOW UP/MHF--Rm 4   Vital Signs:  Patient profile:   60 year old female Height:      63.5 inches Weight:      272 pounds BMI:     47.60 Temp:     97.5 degrees F oral Pulse rate:   84 / minute Pulse rhythm:   regular Resp:     16 per minute BP sitting:   130 / 80  (left arm) Cuff size:   large  Vitals Entered By: Mervin Kung CMA (AAMA) (October 23, 2010 2:21 PM) CC: Rm 4  3 month follow up. Is Patient Diabetic? No Pain Assessment Patient in pain? no      Comments Pt needs refills on Trazadone, Allopurinol and Furosemide. Nicki Guadalajara Fergerson CMA Duncan Dull)  October 23, 2010 2:28 PM    Primary Care [place] Hitchens:  [name] FNP  CC:  Rm 4  3 month follow up.Marland Kitchen  History of Present Illness: Ms Requejo is a 60 year old female who presents today for follow up.  She has no specific complaints.  1.Rotator cuff- s/p repair, completed PT, feeling better- some muscle soreness with certain movements.  2. Gout- no flare ups  3. Edema- only occurs when she works, uses furosemide once daily after the weekends Not exercising- wants to get back into exercise.  Allergies (verified): No Known Drug Allergies  Past History:  Past Medical History: Last updated: 02/17/2009 GOUT, ACUTE (ICD-274.9) CELLULITIS, FOOT, RIGHT (ICD-682.7) OBESITY, UNSPECIFIED (ICD-278.00) CHRONIC LEG EDEMA CELLULITIS, FINGER (ICD-681.00) HYPERTENSION (ICD-401.9) DEPRESSION (ICD-311) Insomnia Hyperlipidemia - borderline - not on meds GERD Osteoarthritis  Past Surgical History: Last updated: 07/09/2010 Appendectomy  2002 Cholecystectomy  1977 Total hip replacement  1998 tubal pregnancy  1971 Hysterectomy  2002 - infection - no history of cancer Right Shoulder rotator cuff repair-- 06/07/10  Physical Exam  General:  Well-developed,well-nourished,in no acute distress; alert,appropriate and cooperative throughout examination Head:  Normocephalic and atraumatic without obvious  abnormalities. No apparent alopecia or balding. Lungs:  Normal respiratory effort, chest expands symmetrically. Lungs are clear to auscultation, no crackles or wheezes. Heart:  Normal rate and regular rhythm. S1 and S2 normal without gallop, murmur, click, rub or other extra sounds. Extremities:  No lower extremity edema   Impression & Recommendations:  Problem # 1:  GOUT (ICD-274.9) Assessment Improved Stable on allopurinol- continue same Her updated medication list for this problem includes:    Allopurinol 300 Mg Tabs (Allopurinol) ..... One half tablet by mouth two times a day  Problem # 2:  LEG EDEMA, CHRONIC (ICD-782.3) Assessment: Improved Stable, continue furosemide on a as needed basis Her updated medication list for this problem includes:    Furosemide 40 Mg Tabs (Furosemide) ..... One tablet by mouth daily as needed for swelling  Complete Medication List: 1)  Pepcid Ac 10 Mg Tabs (Famotidine) .... One tab by mouth once daily as needed 2)  Trazodone Hcl 50 Mg Tabs (Trazodone hcl) .Marland Kitchen.. 1-2 tabs by mouth at bedtime as needed 3)  Aspirin 81 Mg Tbec (Aspirin) .... Once daily 4)  Allopurinol 300 Mg Tabs (Allopurinol) .... One half tablet by mouth two times a day 5)  One-a-day Womens Formula Tabs (Multiple vitamins-calcium) .... Take 1 tablet by mouth once a day 6)  Furosemide 40 Mg Tabs (Furosemide) .... One tablet by mouth daily as needed for swelling  Other Orders: TLB-BMP (Basic Metabolic Panel-BMET) (80048-METABOL)  Patient Instructions: 1)  Please follow up in 3 months, sooner if  problems or concerns. Prescriptions: FUROSEMIDE 40 MG TABS (FUROSEMIDE) one tablet by mouth daily as needed for swelling  #30 x 2   Entered and Authorized by:   [name] FNP   Signed by:   [name] FNP on 10/23/2010   Method used:   Electronically to        [facility]. [phone]* (retail)       [address]       [place], [place]  [address]       Ph:  [phone]       Fax: [phone]   RxID:   [id number] ALLOPURINOL 300 MG TABS (ALLOPURINOL) one half tablet by mouth two times a day  #30 x 2   Entered and Authorized by:   [name] FNP   Signed by:   [name] FNP on 10/23/2010   Method used:   Electronically to        [facility]. [phone]* (retail)       [address]       [place], [place]  [address]       Ph: [phone]       Fax: [phone]   RxID:   [id number] TRAZODONE HCL 50 MG TABS (TRAZODONE HCL) 1-2 tabs by mouth at bedtime as needed  #60 Each x 2   Entered and Authorized by:   [name] FNP   Signed by:   [name] FNP on 10/23/2010   Method used:   Electronically to        [facility]. [phone]* (retail)       [address]       [place], [place]  [address]       Ph: [phone]       Fax: [phone]   RxID:   [id number]    Orders Added: 1)  TLB-BMP (Basic Metabolic Panel-BMET) [80048-METABOL] 2)  Est. Patient Level III [93267]    Current Allergies (reviewed today): No known allergies

## 2011-02-11 ENCOUNTER — Telehealth: Payer: Self-pay | Admitting: Family

## 2011-02-19 NOTE — Progress Notes (Signed)
Summary: refill-allopurinol and furosemide  Phone Note Refill Request Message from:  Fax from Pharmacy on February 11, 2011 1:38 PM  Refills Requested: Medication #1:  ALLOPURINOL 300 MG TABS one half tablet by mouth two times a day   Dosage confirmed as above?Dosage Confirmed   Brand Name Necessary? No   Supply Requested: 1 month   Last Refilled: 01/02/2011  Medication #2:  FUROSEMIDE 40 MG TABS one tablet by mouth daily as needed for swelling.   Dosage confirmed as above?Dosage Confirmed   Brand Name Necessary? No   Supply Requested: 1 month   Last Refilled: 01/02/2011 [facility] [address] [place] [facility] [address] FAX [phone]   Method Requested: Electronic Next Appointment Scheduled: NONE Initial call taken by: Elba Barman,  February 11, 2011 1:39 PM    Prescriptions: FUROSEMIDE 40 MG TABS (FUROSEMIDE) one tablet by mouth daily as needed for swelling  #30 x 0   Entered by:   Mervin Kung CMA (AAMA)   Authorized by:   [name] FNP   Signed by:   Mervin Kung CMA (AAMA) on 02/11/2011   Method used:   Electronically to        [facility]. [phone]* (retail)       [address]       [place], [place]  [address]       Ph: [phone]       Fax: [phone]   RxID:   [id number] ALLOPURINOL 300 MG TABS (ALLOPURINOL) one half tablet by mouth two times a day  #30 x 0   Entered by:   Mervin Kung CMA (AAMA)   Authorized by:   [name] FNP   Signed by:   Mervin Kung CMA (AAMA) on 02/11/2011   Method used:   Electronically to        [facility]. [phone]* (retail)       [address]       [place], [place]  [address]       Ph: [phone]       Fax: [phone]   RxID:   [id number]

## 2011-03-11 LAB — BASIC METABOLIC PANEL
BUN: 25 mg/dL — ABNORMAL HIGH (ref 6–23)
Calcium: 9.9 mg/dL (ref 8.4–10.5)
Creatinine, Ser: 1.12 mg/dL (ref 0.4–1.2)
GFR calc non Af Amer: 50 mL/min — ABNORMAL LOW (ref >60)
Glucose, Bld: 107 mg/dL — ABNORMAL HIGH (ref 70–99)
Potassium: 4.7 mEq/L (ref 3.5–5.1)

## 2011-05-02 ENCOUNTER — Encounter: Payer: Self-pay | Admitting: Family

## 2011-05-03 ENCOUNTER — Encounter: Payer: Self-pay | Admitting: Family

## 2011-05-06 ENCOUNTER — Ambulatory Visit (INDEPENDENT_AMBULATORY_CARE_PROVIDER_SITE_OTHER): Payer: 59 | Admitting: Family

## 2011-05-06 ENCOUNTER — Encounter: Payer: Self-pay | Admitting: Family

## 2011-05-06 ENCOUNTER — Other Ambulatory Visit: Payer: Self-pay | Admitting: Family

## 2011-05-06 DIAGNOSIS — L237 Allergic contact dermatitis due to plants, except food: Secondary | ICD-10-CM

## 2011-05-06 DIAGNOSIS — Z0000 Encounter for general adult medical examination without abnormal findings: Secondary | ICD-10-CM | POA: Insufficient documentation

## 2011-05-06 DIAGNOSIS — F329 Major depressive disorder, single episode, unspecified: Secondary | ICD-10-CM

## 2011-05-06 DIAGNOSIS — L255 Unspecified contact dermatitis due to plants, except food: Secondary | ICD-10-CM

## 2011-05-06 DIAGNOSIS — I10 Essential (primary) hypertension: Secondary | ICD-10-CM

## 2011-05-06 DIAGNOSIS — F3289 Other specified depressive episodes: Secondary | ICD-10-CM

## 2011-05-06 LAB — HEPATIC FUNCTION PANEL
ALT: 22 U/L (ref 0–35)
AST: 16 U/L (ref 0–37)
Albumin: 4.6 g/dL (ref 3.5–5.2)
Alkaline Phosphatase: 126 U/L — ABNORMAL HIGH (ref 39–117)

## 2011-05-06 LAB — CBC WITH DIFFERENTIAL/PLATELET
Basophils Relative: 1 % (ref 0–1)
Eosinophils Absolute: 0.1 10*3/uL (ref 0.0–0.7)
Eosinophils Relative: 1 % (ref 0–5)
HCT: 45 % (ref 36.0–46.0)
Hemoglobin: 15.1 g/dL — ABNORMAL HIGH (ref 12.0–15.0)
MCH: 30.4 pg (ref 26.0–34.0)
MCHC: 33.6 g/dL (ref 30.0–36.0)
MCV: 90.5 fL (ref 78.0–100.0)
Monocytes Absolute: 0.4 10*3/uL (ref 0.1–1.0)
Monocytes Relative: 5 % (ref 3–12)
Neutrophils Relative %: 62 % (ref 43–77)

## 2011-05-06 LAB — LIPID PANEL
Cholesterol: 160 mg/dL (ref 0–200)
VLDL: 13 mg/dL (ref 0–40)

## 2011-05-06 MED ORDER — ALLOPURINOL 300 MG PO TABS: 150.0000 mg | ORAL_TABLET | Freq: Two times a day (BID) | ORAL | Status: DC

## 2011-05-06 MED ORDER — METHYLPREDNISOLONE SODIUM SUCC 125 MG IJ SOLR
125.0000 mg | Freq: Once | INTRAMUSCULAR | Status: AC
  Administered 2011-05-06: 125 mg via INTRAMUSCULAR

## 2011-05-06 MED ORDER — FUROSEMIDE 40 MG PO TABS: 40.0000 mg | ORAL_TABLET | Freq: Every day | ORAL | Status: DC | PRN

## 2011-05-06 MED ORDER — LISINOPRIL 10 MG PO TABS: 10.0000 mg | ORAL_TABLET | Freq: Every day | ORAL | Status: DC

## 2011-05-06 MED ORDER — BETAMETHASONE DIPROPIONATE 0.05 % EX CREA: TOPICAL_CREAM | Freq: Two times a day (BID) | CUTANEOUS | Status: DC

## 2011-05-06 MED ORDER — CITALOPRAM HYDROBROMIDE 20 MG PO TABS: 20.0000 mg | ORAL_TABLET | Freq: Every day | ORAL | Status: DC

## 2011-05-06 NOTE — Assessment & Plan Note (Addendum)
BP Readings from Last 3 Encounters:  05/06/11 150/86  10/23/10 130/80  07/09/10 122/84   BP is up today.  Will add ACE. F/u in 1 month.

## 2011-05-06 NOTE — Progress Notes (Signed)
Subjective:    Patient ID: Jordan [name], female    DOB: August 24, 1951, 60 y.o.   MRN: [id number]  HPI  Jordan [name] is a 60 yr old female who presents today for her physical.  Up to date on tetanus, colo, needs mammogram and bone density.  Exercise-  Staying active.  Not as hungry as she used to be.  Notes increased life stress- bought a new house.  Hosting her Grandson's wedding.    Poison oak- burned some poison oak on Monday.  Now developing multiple itching spots on her skin  Anxiety- daughter has gastric cancer and is awaiting a consultation with her oncologist.      Review of Systems  Constitutional: Negative for fever and unexpected weight change.  Cardiovascular: Positive for leg swelling.  Musculoskeletal: Positive for arthralgias.  Skin: Positive for rash.  Psychiatric/Behavioral: Negative for sleep disturbance.   See HPI Past Medical History  Diagnosis Date  . Gout, unspecified   . Cellulitis and abscess of foot, except toes   . Obesity, unspecified   . Leg edema     chronic  . Cellulitis and abscess of finger, unspecified 401.9  . Depressive disorder, not elsewhere classified   . Insomnia   . Hyperlipidemia     borderline- not on meds  . GERD (gastroesophageal reflux disease)   . Osteoarthritis     History   Social History  . Marital Status: Single    Spouse Name: N/A    Number of Children: N/A  . Years of Education: N/A   Occupational History  . Not on file.   Social History Main Topics  . Smoking status: Former Smoker -- 20 years    Types: Cigarettes    Quit date: 12/23/2008  . Smokeless tobacco: Never Used  . Alcohol Use: Yes     rare  . Drug Use: Not on file  . Sexually Active: Not on file   Other Topics Concern  . Not on file   Social History Narrative  . No narrative on file    Past Surgical History  Procedure Date  . Appendectomy 2002  . Cholecystectomy 1977  . Total hip arthroplasty 1998  . Ectopic pregnancy surgery 1971  .  Abdominal hysterectomy 2002    infection- no history of cancer  . Right shoulder rotator cuff repair 06-07-10    Family History  Problem Relation Age of Onset  . Diabetes Mother   . Cancer Mother     colon/ pancreatic  . Hypertension Father   . Alcohol abuse Father   . Cirrhosis Father   . Other Sister     Pyeoderma gangrenosis  . Obesity Daughter   . Diabetes Sister     type 2    No Known Allergies  Current Outpatient Prescriptions on File Prior to Visit  Medication Sig Dispense Refill  . aspirin 81 MG tablet Take 81 mg by mouth daily.        . famotidine (PEPCID AC) 10 MG chewable tablet Chew 10 mg by mouth daily.        . Multiple Vitamins-Calcium (ONE-A-DAY WOMENS FORMULA) TABS Take 1 tablet by mouth daily.        Marland Kitchen DISCONTD: allopurinol (ZYLOPRIM) 300 MG tablet Take 150 mg by mouth 2 (two) times daily.        Marland Kitchen DISCONTD: furosemide (LASIX) 40 MG tablet Take 40 mg by mouth daily as needed.       Marland Kitchen DISCONTD: traZODone (DESYREL) 50 MG tablet  Take by mouth at bedtime. Take 1-2 tabs        No current facility-administered medications on file prior to visit.    BP 150/86  Pulse 72  Temp(Src) 98.1 F (36.7 C) (Oral)  Resp 16  Wt 270 lb 1.3 oz (122.507 kg)        Objective:   Physical Exam  Constitutional: She appears well-developed and well-nourished.  HENT:  Head: Normocephalic and atraumatic.  Right Ear: Tympanic membrane normal.  Left Ear: Tympanic membrane normal.  Mouth/Throat: No oropharyngeal exudate.  Eyes: Conjunctivae and lids are normal. Pupils are equal, round, and reactive to light.  Neck: Normal range of motion. Neck supple. No tracheal deviation present. No thyromegaly present.  Cardiovascular: Normal rate and regular rhythm.   Pulmonary/Chest: Effort normal and breath sounds normal.  Abdominal: Soft. Bowel sounds are normal.  Musculoskeletal: Normal range of motion.  Neurological: She is alert. She exhibits normal muscle tone. Coordination  normal.  Skin: Skin is warm and dry.       Multiple reddened spots/patches noted diffusely.  Psychiatric: Cognition and memory are normal.       Tearful during discussion of her daughter's health  Breast: no masses, dimpling or axillary LAD       Assessment & Plan:

## 2011-05-06 NOTE — Patient Instructions (Addendum)
Please schedule a follow up appointment in 1 month. Citalopram- 20mg  (take 1/2 tab once daily for one week then increase to a full tablet daily on week two). Call if you develop worsening depression, or suicidal thoughts. Schedule your bone density test at the front desk and your mammogram on the first floor.  Call us to schedule your Zostavax (shingles vaccine)

## 2011-05-06 NOTE — Assessment & Plan Note (Signed)
Pt reports + anxiety.  I suspect she also is experiencing some depression.  Will add trial of citalopram.  Pt counseled on sided effects including risk of suicide ideation.  She verbalizes understanding.

## 2011-05-06 NOTE — Assessment & Plan Note (Signed)
Pt counseled on diet, exercise, and weight loss.  Recommended zostavax-she will call insurer and book nurse visit.  Due for mammogram and dexa- both ordered today.  [facility].

## 2011-05-06 NOTE — Assessment & Plan Note (Signed)
Pt given solumedrol injection today.  She declines PO pred taper, but is agreeable to a topical steroid.

## 2011-05-07 LAB — BASIC METABOLIC PANEL WITH GFR
BUN: 26 mg/dL — ABNORMAL HIGH (ref 6–23)
Calcium: 9.7 mg/dL (ref 8.4–10.5)
Creat: 1.09 mg/dL (ref 0.40–1.20)
GFR, Est African American: >60 mL/min (ref >60)
Glucose, Bld: 110 mg/dL — ABNORMAL HIGH (ref 70–99)

## 2011-05-08 ENCOUNTER — Ambulatory Visit (HOSPITAL_BASED_OUTPATIENT_CLINIC_OR_DEPARTMENT_OTHER)
Admission: RE | Admit: 2011-05-08 | Discharge: 2011-05-08 | Disposition: A | Discharge status: Home / Self Care | Payer: 59 | Source: Ambulatory Visit | Attending: Family | Admitting: Family

## 2011-05-08 ENCOUNTER — Encounter: Payer: Self-pay | Admitting: Family

## 2011-05-08 ENCOUNTER — Encounter: Payer: Self-pay | Admitting: *Deleted

## 2011-05-08 ENCOUNTER — Telehealth: Payer: Self-pay | Admitting: *Deleted

## 2011-05-08 DIAGNOSIS — R739 Hyperglycemia, unspecified: Secondary | ICD-10-CM

## 2011-05-08 DIAGNOSIS — Z1231 Encounter for screening mammogram for malignant neoplasm of breast: Secondary | ICD-10-CM | POA: Insufficient documentation

## 2011-05-08 DIAGNOSIS — N289 Disorder of kidney and ureter, unspecified: Secondary | ICD-10-CM

## 2011-05-08 HISTORY — DX: Disorder of kidney and ureter, unspecified: N28.9

## 2011-05-08 HISTORY — DX: Hyperglycemia, unspecified: R73.9

## 2011-05-08 LAB — HEMOGLOBIN A1C
Hgb A1c MFr Bld: 5.8 % — ABNORMAL HIGH (ref <5.7)
Mean Plasma Glucose: 120 mg/dL — ABNORMAL HIGH (ref <117)

## 2011-05-08 NOTE — Telephone Encounter (Signed)
Message copied by Mervin Kung on Wed May 08, 2011 11:23 AM ------      Message from: O'SULLIVAN, MELISSA      Created: Tue May 07, 2011  8:59 AM       Could you pls call [facility] and ask them to add on A1C- diagnosis hyperglycemia.

## 2011-05-08 NOTE — Telephone Encounter (Signed)
Called patient and discussed lab results.  She reports using NSAIDS periodically.  I advised her to avoid due to slight elevation of her kidney function.  I suggested tylenol PRN.  Also, sugar was up.  Will add on A1C to current lab work.

## 2011-05-08 NOTE — Telephone Encounter (Signed)
Spoke to [place] at [facility] and added hgb a1c.

## 2011-05-09 ENCOUNTER — Telehealth: Payer: Self-pay | Admitting: *Deleted

## 2011-05-09 NOTE — Telephone Encounter (Signed)
Spoke to andrea at [phone], she will fax report.

## 2011-05-09 NOTE — Telephone Encounter (Signed)
Message copied by Mervin Kung on Thu May 09, 2011  4:15 PM ------      Message from: O'SULLIVAN, MELISSA      Created: Mon May 06, 2011 11:37 AM       Could you pls call [facility] and request copy of her last colonoscopy? Thanks

## 2011-05-10 NOTE — Telephone Encounter (Signed)
05/13/06 colonoscopy report received and forwarded to Provider for review.

## 2011-06-05 ENCOUNTER — Encounter: Payer: Self-pay | Admitting: Family

## 2011-06-07 ENCOUNTER — Ambulatory Visit (INDEPENDENT_AMBULATORY_CARE_PROVIDER_SITE_OTHER)
Admission: RE | Admit: 2011-06-07 | Discharge: 2011-06-07 | Disposition: A | Discharge status: Home / Self Care | Payer: 59 | Source: Ambulatory Visit

## 2011-06-07 ENCOUNTER — Encounter: Payer: Self-pay | Admitting: Family

## 2011-06-07 ENCOUNTER — Ambulatory Visit (INDEPENDENT_AMBULATORY_CARE_PROVIDER_SITE_OTHER): Payer: 59 | Admitting: Family

## 2011-06-07 DIAGNOSIS — F329 Major depressive disorder, single episode, unspecified: Secondary | ICD-10-CM

## 2011-06-07 DIAGNOSIS — I10 Essential (primary) hypertension: Secondary | ICD-10-CM

## 2011-06-07 DIAGNOSIS — Z13820 Encounter for screening for osteoporosis: Secondary | ICD-10-CM

## 2011-06-07 MED ORDER — CITALOPRAM HYDROBROMIDE 20 MG PO TABS: 20.0000 mg | ORAL_TABLET | Freq: Every day | ORAL | Status: DC

## 2011-06-07 NOTE — Assessment & Plan Note (Signed)
The patient notes that she never did start lisinopril. BP is stable today. We'll monitor her off of lisinopril.

## 2011-06-07 NOTE — Patient Instructions (Addendum)
Please follow up in 3 months, sooner if problems or concerns. 

## 2011-06-07 NOTE — Assessment & Plan Note (Signed)
This is improved since last visit. She is starting citalopram without any problems. She has gained a few pounds since her last visit. I have advised her to keep close eye on her weight. Contact us if she continues to gain weight. She was encouraged to increase her exercise.

## 2011-06-07 NOTE — Progress Notes (Signed)
Subjective:    Patient ID: Jordan [name], female    DOB: December 07, 1951, 60 y.o.   MRN: [id number]  HPI  Anxiety/Depression- Pt reports that she is feeling better on citalopram.  Sleeping well, denies panics attack.  Feels motivated to do things she enjoys.  Has been swimming in the pool with her granddaughter.  Her daughter was recently diagnosed with gastric cancer which was found following gastric bypass procedure. She reports that her daughter has had a good followup report which is reassuring to her. Her son recently suffered what was believed to be a heart attack which also caused her some increased stress. Overall she is feeling better. She has gained 5 pounds since her last visit. She attributes this to recent family wedding.  Review of Systems Depression- denies suicide ideation. She denies nausea or somnolence on citalopram  Past Medical History  Diagnosis Date  . Gout, unspecified   . Cellulitis and abscess of foot, except toes   . Obesity, unspecified   . Leg edema     chronic  . Cellulitis and abscess of finger, unspecified 401.9  . Depressive disorder, not elsewhere classified   . Insomnia   . Hyperlipidemia     borderline- not on meds  . GERD (gastroesophageal reflux disease)   . Osteoarthritis   . Hyperglycemia 05/08/2011  . Renal insufficiency, mild 05/08/2011    History   Social History  . Marital Status: Single    Spouse Name: N/A    Number of Children: 2  . Years of Education: N/A   Occupational History  .  [employer]   Social History Main Topics  . Smoking status: Former Smoker -- 20 years    Types: Cigarettes    Quit date: 12/23/2008  . Smokeless tobacco: Never Used  . Alcohol Use: Yes     rare  . Drug Use: Not on file  . Sexually Active: Not on file   Other Topics Concern  . Not on file   Social History Narrative  . No narrative on file    Past Surgical History  Procedure Date  . Appendectomy 2002  . Cholecystectomy 1977  . Total hip  arthroplasty 1998  . Ectopic pregnancy surgery 1971  . Abdominal hysterectomy 2002    infection- no history of cancer  . Right shoulder rotator cuff repair 06-07-10    Family History  Problem Relation Age of Onset  . Diabetes Mother   . Cancer Mother     colon/ pancreatic  . Hypertension Father   . Alcohol abuse Father   . Cirrhosis Father   . Other Sister     Pyeoderma gangrenosis  . Obesity Daughter   . Diabetes Sister     type 2    No Known Allergies  Current Outpatient Prescriptions on File Prior to Visit  Medication Sig Dispense Refill  . allopurinol (ZYLOPRIM) 300 MG tablet Take 1 tablet (300 mg total) by mouth 2 (two) times daily.  60 tablet  5  . aspirin 81 MG tablet Take 81 mg by mouth daily.        . famotidine (PEPCID AC) 10 MG chewable tablet Chew 10 mg by mouth daily.        . furosemide (LASIX) 40 MG tablet Take 1 tablet (40 mg total) by mouth daily as needed.  30 tablet  5  . Multiple Vitamins-Calcium (ONE-A-DAY WOMENS FORMULA) TABS Take 1 tablet by mouth daily.        Marland Kitchen DISCONTD: citalopram (CELEXA)  20 MG tablet Take 1 tablet (20 mg total) by mouth daily.  30 tablet  1  . DISCONTD: betamethasone dipropionate (DIPROLENE) 0.05 % cream Apply topically 2 (two) times daily.  30 g  0  . DISCONTD: lisinopril (PRINIVIL,ZESTRIL) 10 MG tablet Take 1 tablet (10 mg total) by mouth daily.  30 tablet  5  . DISCONTD: traZODone (DESYREL) 50 MG tablet Take 50 mg by mouth at bedtime.          BP 124/80  Pulse 64  Temp(Src) 97.9 F (36.6 C) (Oral)  Resp 20  Wt 275 lb (124.739 kg)  SpO2 97%       Objective:   Physical Exam  Constitutional: She appears well-developed and well-nourished. No distress.  Musculoskeletal: She exhibits no edema.  Psychiatric: She has a normal mood and affect. Her behavior is normal. Judgment and thought content normal.          Assessment & Plan:  50 minutes the spell the patient states greater than 50% of this time spent counseling  the patient on her anxiety and depression.

## 2011-06-10 ENCOUNTER — Telehealth: Payer: Self-pay | Admitting: Family

## 2011-06-10 NOTE — Telephone Encounter (Signed)
Pls call patient and let her know that her lab test shows that she has had the chicken pox.  It is OK for her to receive the zostavax. She can schedule a nurse visit at her convenience.

## 2011-06-11 NOTE — Telephone Encounter (Signed)
Left message on machine to return my call. 

## 2011-06-11 NOTE — Telephone Encounter (Signed)
Notified pt and transferred her to [facility] to schedule nurse visit. Pt states insurance told her it will cover injection at 100%.

## 2011-06-12 ENCOUNTER — Encounter: Payer: Self-pay | Admitting: Family

## 2011-06-19 ENCOUNTER — Telehealth: Payer: Self-pay | Admitting: *Deleted

## 2011-06-19 ENCOUNTER — Ambulatory Visit (INDEPENDENT_AMBULATORY_CARE_PROVIDER_SITE_OTHER): Payer: 59 | Admitting: Family

## 2011-06-19 DIAGNOSIS — Z2911 Encounter for prophylactic immunotherapy for respiratory syncytial virus (RSV): Secondary | ICD-10-CM

## 2011-06-19 DIAGNOSIS — Z23 Encounter for immunization: Secondary | ICD-10-CM

## 2011-06-19 MED ORDER — TRAMADOL HCL 50 MG PO TABS: ORAL_TABLET | ORAL | Status: DC

## 2011-06-19 NOTE — Telephone Encounter (Signed)
She can try tramadol short term for pain.  Rx sent to pharmacy. She should arrange f/u with orthopedics.

## 2011-06-19 NOTE — Telephone Encounter (Signed)
Pt notified per Melissa's instructions below.

## 2011-06-19 NOTE — Telephone Encounter (Signed)
Pt states her leg pain has returned and she wants to know what she can take for it? She states that Tylenol does not help it and she would like to take Ibuprofen but has previously been told she couldn't take Ibuprofen. She reports that she will make arrangements to see an orthopedic specialist but wanted to know what to take until then? Please advise.

## 2011-07-08 ENCOUNTER — Encounter: Payer: Self-pay | Admitting: Gastroenterology

## 2011-08-22 ENCOUNTER — Telehealth: Payer: Self-pay | Admitting: *Deleted

## 2011-08-22 NOTE — Telephone Encounter (Signed)
Per Sandford Craze, NP advised pt that she needs to schedule an appt to discuss possible med change with Melissa. Appt scheduled for 08/23/11 at 1:30pm.

## 2011-08-22 NOTE — Telephone Encounter (Signed)
Received message from pt that she doesn't feel Celexa is helping any longer. She reports increased depression and increase with gain. Pt reports weight is stable from last visit. She is just not able to lose the 5 pounds she gained. States she is eating very little. Feels like she is just going through the motions; doesn't want to do anything. Please advise.

## 2011-08-23 ENCOUNTER — Ambulatory Visit (INDEPENDENT_AMBULATORY_CARE_PROVIDER_SITE_OTHER): Payer: 59 | Admitting: Family

## 2011-08-23 ENCOUNTER — Encounter: Payer: Self-pay | Admitting: Family

## 2011-08-23 ENCOUNTER — Telehealth: Payer: Self-pay | Admitting: Family

## 2011-08-23 ENCOUNTER — Ambulatory Visit: Payer: 59 | Admitting: Family

## 2011-08-23 DIAGNOSIS — I10 Essential (primary) hypertension: Secondary | ICD-10-CM

## 2011-08-23 DIAGNOSIS — F329 Major depressive disorder, single episode, unspecified: Secondary | ICD-10-CM

## 2011-08-23 MED ORDER — TRIAMTERENE-HCTZ 50-25 MG PO CAPS: 1.0000 | ORAL_CAPSULE | ORAL | Status: DC

## 2011-08-23 MED ORDER — DULOXETINE HCL 60 MG PO CPEP: 60.0000 mg | ORAL_CAPSULE | Freq: Every day | ORAL | Status: DC

## 2011-08-23 NOTE — Telephone Encounter (Signed)
Please advise 

## 2011-08-23 NOTE — Telephone Encounter (Signed)
Patient would like to know when she should start taking cymbalta? She wasn't sure if she should take one today since she has already taken a celexa this morning.

## 2011-08-23 NOTE — Patient Instructions (Signed)
Stop citalopram, start cymbalta. Follow up in 1 month, sooner if symptoms worsen. Stop Furosemide, start Dyazide.

## 2011-08-23 NOTE — Telephone Encounter (Signed)
Pt.notified

## 2011-08-23 NOTE — Telephone Encounter (Signed)
Start cymbalta tomorrow AM.

## 2011-08-23 NOTE — Progress Notes (Addendum)
Subjective:    Patient ID: Jordan [name], female    DOB: 1951/05/19, 60 y.o.   MRN: [id number]  HPI  [name] is a 60 yr old female who presents today for follow up of her depression.   Depression- No motivation, people "get on my nerves."  Stays in her room, sleeps a lot.  Notes that she has "fear of gaining weight" and has "just about stopped eating."  Notes occasional tearfulness.  Feels anger.  Notes occasional nervousness/shaking.  Feels like she has to force herself to do things. Denies suicidal or homicidal ideation.    BP- Reports that she has been using furosemide only on occasion as her edema has not been that bad.       Review of Systems See HPI  Past Medical History  Diagnosis Date  . Gout, unspecified   . Cellulitis and abscess of foot, except toes   . Obesity, unspecified   . Leg edema     chronic  . Cellulitis and abscess of finger, unspecified 401.9  . Depressive disorder, not elsewhere classified   . Insomnia   . Hyperlipidemia     borderline- not on meds  . GERD (gastroesophageal reflux disease)   . Osteoarthritis   . Hyperglycemia 05/08/2011  . Renal insufficiency, mild 05/08/2011    History   Social History  . Marital Status: Single    Spouse Name: N/A    Number of Children: 2  . Years of Education: N/A   Occupational History  .  [employer]   Social History Main Topics  . Smoking status: Former Smoker -- 20 years    Types: Cigarettes    Quit date: 12/23/2008  . Smokeless tobacco: Never Used  . Alcohol Use: Yes     rare  . Drug Use: Not on file  . Sexually Active: Not on file   Other Topics Concern  . Not on file   Social History Narrative  . No narrative on file    Past Surgical History  Procedure Date  . Appendectomy 2002  . Cholecystectomy 1977  . Total hip arthroplasty 1998  . Ectopic pregnancy surgery 1971  . Abdominal hysterectomy 2002    infection- no history of cancer  . Right shoulder rotator cuff repair 06-07-10     Family History  Problem Relation Age of Onset  . Diabetes Mother   . Cancer Mother     colon/ pancreatic  . Hypertension Father   . Alcohol abuse Father   . Cirrhosis Father   . Other Sister     Pyeoderma gangrenosis  . Obesity Daughter   . Diabetes Sister     type 2    No Known Allergies  Current Outpatient Prescriptions on File Prior to Visit  Medication Sig Dispense Refill  . allopurinol (ZYLOPRIM) 300 MG tablet Take 1 tablet (300 mg total) by mouth 2 (two) times daily.  60 tablet  5  . aspirin 81 MG tablet Take 81 mg by mouth daily.        . famotidine (PEPCID AC) 10 MG chewable tablet Chew 10 mg by mouth daily.        . Multiple Vitamins-Calcium (ONE-A-DAY WOMENS FORMULA) TABS Take 1 tablet by mouth daily.        . traMADol (ULTRAM) 50 MG tablet Take on tablet every 8 hours as needed for severe pain not relieved by tylenol.  Do not drive after taking this medication.  20 tablet  0  BP 150/80  Pulse 84  Temp(Src) 98.1 F (36.7 C) (Oral)  Resp 16  Ht 5' 3.5" (1.613 m)  Wt 274 lb (124.286 kg)  BMI 47.77 kg/m2        Objective:   Physical Exam  Constitutional: She appears well-developed and well-nourished.  HENT:  Head: Normocephalic and atraumatic.  Eyes: Conjunctivae are normal.  Psychiatric: Judgment and thought content normal.       Affect is flat.  Became tearful briefly during interview.            Assessment & Plan:  10 2014 [id number] c  cymbalta 60

## 2011-08-27 NOTE — Assessment & Plan Note (Addendum)
Deteriorated. Will switch from citalopram to Cymbalta. Samples provided.

## 2011-08-27 NOTE — Assessment & Plan Note (Addendum)
Deteriorated. Will resume dyazide which she has taken in the past.  Stop furosemide for now- will see if the dyazide holds her edema as well.   BP Readings from Last 3 Encounters:  08/23/11 150/80  06/07/11 124/80  05/06/11 150/86

## 2011-09-06 ENCOUNTER — Ambulatory Visit: Payer: 59 | Admitting: Family

## 2011-09-23 ENCOUNTER — Ambulatory Visit (INDEPENDENT_AMBULATORY_CARE_PROVIDER_SITE_OTHER): Payer: 59 | Admitting: Family

## 2011-09-23 ENCOUNTER — Encounter: Payer: Self-pay | Admitting: Family

## 2011-09-23 VITALS — BP 130/88 | HR 90 | Temp 98.6°F | Resp 16 | Ht 63.5 in | Wt 280.0 lb

## 2011-09-23 DIAGNOSIS — F329 Major depressive disorder, single episode, unspecified: Secondary | ICD-10-CM

## 2011-09-23 DIAGNOSIS — E669 Obesity, unspecified: Secondary | ICD-10-CM

## 2011-09-23 DIAGNOSIS — I10 Essential (primary) hypertension: Secondary | ICD-10-CM

## 2011-09-23 LAB — BASIC METABOLIC PANEL WITH GFR
BUN: 29 mg/dL — ABNORMAL HIGH (ref 6–23)
Calcium: 9.7 mg/dL (ref 8.4–10.5)
Creat: 1.1 mg/dL (ref 0.50–1.10)
GFR, Est African American: >60 mL/min (ref >60)
GFR, Est Non African American: 50 mL/min — ABNORMAL LOW (ref >60)

## 2011-09-23 MED ORDER — DULOXETINE HCL 60 MG PO CPEP: 60.0000 mg | ORAL_CAPSULE | Freq: Every day | ORAL | Status: DC

## 2011-09-23 NOTE — Patient Instructions (Signed)
Please complete your lab work on the first floor.  Follow up in 3 months, sooner if problems or concerns.  

## 2011-09-23 NOTE — Assessment & Plan Note (Signed)
Deteriorated. We discussed the importance of exercise and weight loss.

## 2011-09-23 NOTE — Assessment & Plan Note (Signed)
Improved, will continue dyazide. Check BMET today.

## 2011-09-23 NOTE — Assessment & Plan Note (Signed)
Greatly improved since last visit. Plan to continue cymbalta.

## 2011-09-23 NOTE — Progress Notes (Signed)
Subjective:    Patient ID: Jordan [name], female    DOB: 1951-10-02, 60 y.o.   MRN: [id number]  HPI  Jordan [name] is a 60 yr old female who presents today for follow up of her depression.  Last visit she reported no motivation, people would "get on my nerves." At that time she was staying in her room and sleeping a lot. She also was reporting that she had to force herself to do things.  Her citalopram was changed to Cymbalta last visit.  She reports that it took about 2 weeks but that she is feeling "much better."  Family members have been commenting on how much better she is doing.  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  She is pleased with the cymbalta and wishes to continue.  Weight gain- She has gained 6 pounds in the last month. Recently celebrated her birthday.  Notes that she "splurged a little."  Although she has been more active recently, she has not yet returned to her exercise routine.   HTN- BP is better.  She is now back on dyazide.  She does not notice any significant swelling.     Review of Systems    see HPI  Past Medical History  Diagnosis Date  . Gout, unspecified   . Cellulitis and abscess of foot, except toes   . Obesity, unspecified   . Leg edema     chronic  . Cellulitis and abscess of finger, unspecified 401.9  . Depressive disorder, not elsewhere classified   . Insomnia   . Hyperlipidemia     borderline- not on meds  . GERD (gastroesophageal reflux disease)   . Osteoarthritis   . Hyperglycemia 05/08/2011  . Renal insufficiency, mild 05/08/2011    History   Social History  . Marital Status: Single    Spouse Name: N/A    Number of Children: 2  . Years of Education: N/A   Occupational History  .  [employer]   Social History Main Topics  . Smoking status: Former Smoker -- 20 years    Types: Cigarettes    Quit date: 12/23/2008  . Smokeless tobacco: Never Used  .  Alcohol Use: Yes     rare  . Drug Use: Not on file  . Sexually Active: Not on file   Other Topics Concern  . Not on file   Social History Narrative  . No narrative on file    Past Surgical History  Procedure Date  . Appendectomy 2002  . Cholecystectomy 1977  . Total hip arthroplasty 1998  . Ectopic pregnancy surgery 1971  . Abdominal hysterectomy 2002    infection- no history of cancer  . Right shoulder rotator cuff repair 06-07-10    Family History  Problem Relation Age of Onset  . Diabetes Mother   . Cancer Mother     colon/ pancreatic  . Hypertension Father   . Alcohol abuse Father   . Cirrhosis Father   . Other Sister     Pyeoderma gangrenosis  . Obesity Daughter   . Diabetes Sister     type 2    No Known Allergies  Current Outpatient Prescriptions on File Prior to Visit  Medication Sig Dispense Refill  . allopurinol (ZYLOPRIM) 300 MG tablet Take 1 tablet (300 mg total) by mouth 2 (two) times daily.  60 tablet  5  . aspirin 81 MG tablet Take 81  mg by mouth daily.        . famotidine (PEPCID AC) 10 MG chewable tablet Chew 10 mg by mouth daily.        . Multiple Vitamins-Calcium (ONE-A-DAY WOMENS FORMULA) TABS Take 1 tablet by mouth daily.        Marland Kitchen triamterene-hydrochlorothiazide (DYAZIDE) 37.5-25 MG per capsule Take 1 capsule by mouth every morning.          BP 130/88  Pulse 90  Temp(Src) 98.6 F (37 C) (Oral)  Resp 16  Ht 5' 3.5" (1.613 m)  Wt 280 lb (127.007 kg)  BMI 48.82 kg/m2    Objective:   Physical Exam  Constitutional: She appears well-developed and well-nourished. No distress.  Musculoskeletal: She exhibits no edema.  Psychiatric: She has a normal mood and affect. Her behavior is normal. Judgment and thought content normal.          Assessment & Plan:

## 2011-09-24 ENCOUNTER — Telehealth: Payer: Self-pay | Admitting: Family

## 2011-09-24 DIAGNOSIS — R7309 Other abnormal glucose: Secondary | ICD-10-CM

## 2011-09-24 NOTE — Telephone Encounter (Signed)
Left message on machine to return my call. 

## 2011-09-24 NOTE — Telephone Encounter (Signed)
Pls call patient and let her know that her sugar is up a bit.  I would like for her to return to the lab pls to have A1C- diagnosis is hyperglycemia.

## 2011-09-25 NOTE — Telephone Encounter (Signed)
Pt notified and states that she had eaten a piece of birthday cake before her appt. Does she still need to return for Hgb A1c?

## 2011-09-25 NOTE — Telephone Encounter (Signed)
Pt notified and will return to the lab on Friday. Lab order entered and forwarded to the lab.

## 2011-09-25 NOTE — Telephone Encounter (Signed)
Patient returned phone call. Best# [phone]

## 2011-09-25 NOTE — Telephone Encounter (Signed)
Yes please

## 2011-09-30 ENCOUNTER — Encounter: Payer: Self-pay | Admitting: Family

## 2011-10-29 ENCOUNTER — Other Ambulatory Visit: Payer: Self-pay | Admitting: Family

## 2011-12-27 ENCOUNTER — Ambulatory Visit: Payer: 59 | Admitting: Family

## 2012-01-03 ENCOUNTER — Ambulatory Visit (INDEPENDENT_AMBULATORY_CARE_PROVIDER_SITE_OTHER): Payer: 59 | Admitting: Family

## 2012-01-03 ENCOUNTER — Encounter: Payer: Self-pay | Admitting: Family

## 2012-01-03 VITALS — BP 110/80 | HR 72 | Temp 97.6°F | Resp 16 | Ht 63.5 in | Wt 275.0 lb

## 2012-01-03 DIAGNOSIS — E669 Obesity, unspecified: Secondary | ICD-10-CM

## 2012-01-03 DIAGNOSIS — K219 Gastro-esophageal reflux disease without esophagitis: Secondary | ICD-10-CM

## 2012-01-03 DIAGNOSIS — R609 Edema, unspecified: Secondary | ICD-10-CM

## 2012-01-03 DIAGNOSIS — I10 Essential (primary) hypertension: Secondary | ICD-10-CM

## 2012-01-03 DIAGNOSIS — F329 Major depressive disorder, single episode, unspecified: Secondary | ICD-10-CM

## 2012-01-03 MED ORDER — TRIAMTERENE-HCTZ 37.5-25 MG PO CAPS: 1.0000 | ORAL_CAPSULE | ORAL | Status: DC

## 2012-01-03 MED ORDER — FUROSEMIDE 40 MG PO TABS: 40.0000 mg | ORAL_TABLET | Freq: Every day | ORAL | Status: DC | PRN

## 2012-01-03 MED ORDER — DULOXETINE HCL 60 MG PO CPEP: 60.0000 mg | ORAL_CAPSULE | Freq: Every day | ORAL | Status: DC

## 2012-01-03 MED ORDER — ALLOPURINOL 300 MG PO TABS: 150.0000 mg | ORAL_TABLET | Freq: Two times a day (BID) | ORAL | Status: DC

## 2012-01-03 NOTE — Assessment & Plan Note (Signed)
Improved, continue current regimen of dyazide alternating with lasix.

## 2012-01-03 NOTE — Assessment & Plan Note (Signed)
BP stable on dyazide alternating with lasix.  Continue this regimen, obtain bmet.

## 2012-01-03 NOTE — Progress Notes (Signed)
Subjective:    Patient ID: Jordan [name], female    DOB: [birth year], 61 y.o.   MRN: [id number]  HPI  [name] is a 61 yr old female who presents today for follow up.  1) obesity- has lost 5 pounds.  2) HTN- Bp states that she has been alternating diazide every other day with lasix.  Swelling is better at the end of the day with this regimen.    3) Depression-  Mood is great,  She continues cymbalta.       Review of Systems See hpi  Past Medical History  Diagnosis Date  . Gout, unspecified   . Cellulitis and abscess of foot, except toes   . Obesity, unspecified   . Leg edema     chronic  . Cellulitis and abscess of finger, unspecified 401.9  . Depressive disorder, not elsewhere classified   . Insomnia   . Hyperlipidemia     borderline- not on meds  . GERD (gastroesophageal reflux disease)   . Osteoarthritis   . Hyperglycemia 05/08/2011  . Renal insufficiency, mild 05/08/2011    History   Social History  . Marital Status: Single    Spouse Name: N/A    Number of Children: 2  . Years of Education: N/A   Occupational History  .  [employer]   Social History Main Topics  . Smoking status: Former Smoker -- 20 years    Types: Cigarettes    Quit date: 12/23/2008  . Smokeless tobacco: Never Used  . Alcohol Use: Yes     rare  . Drug Use: Not on file  . Sexually Active: Not on file   Other Topics Concern  . Not on file   Social History Narrative  . No narrative on file    Past Surgical History  Procedure Date  . Appendectomy 2002  . Cholecystectomy 1977  . Total hip arthroplasty 1998  . Ectopic pregnancy surgery 1971  . Abdominal hysterectomy 2002    infection- no history of cancer  . Right shoulder rotator cuff repair 06-07-10    Family History  Problem Relation Age of Onset  . Diabetes Mother   . Cancer Mother     colon/ pancreatic  . Hypertension Father   . Alcohol abuse Father   . Cirrhosis Father   . Other Sister     Pyeoderma gangrenosis    . Obesity Daughter   . Diabetes Sister     type 2    No Known Allergies  Current Outpatient Prescriptions on File Prior to Visit  Medication Sig Dispense Refill  . aspirin 81 MG tablet Take 81 mg by mouth daily.        . famotidine (PEPCID AC) 10 MG chewable tablet Chew 10 mg by mouth daily.        . Multiple Vitamins-Calcium (ONE-A-DAY WOMENS FORMULA) TABS Take 1 tablet by mouth daily.          BP 110/80  Pulse 72  Temp(Src) 97.6 F (36.4 C) (Oral)  Resp 16  Ht 5' 3.5" (1.613 m)  Wt 275 lb (124.739 kg)  BMI 47.95 kg/m2       Objective:   Physical Exam  Constitutional: She appears well-developed and well-nourished. No distress.  HENT:  Head: Normocephalic and atraumatic.  Cardiovascular: Normal rate and regular rhythm.   No murmur heard. Pulmonary/Chest: Effort normal and breath sounds normal. No respiratory distress. She has no wheezes. She has no rales.  Musculoskeletal: She exhibits no edema.  Skin: Skin is warm and dry.  Psychiatric: She has a normal mood and affect. Her behavior is normal. Judgment and thought content normal.          Assessment & Plan:

## 2012-01-03 NOTE — Assessment & Plan Note (Signed)
She has lost 5 pounds since her last visit.  I commended her for this.

## 2012-01-03 NOTE — Assessment & Plan Note (Signed)
Stable on Cymbalta. Continue same.  

## 2012-01-03 NOTE — Patient Instructions (Signed)
Please follow up in 6 months, sooner if problems or concerns.  

## 2012-01-04 LAB — BASIC METABOLIC PANEL
Calcium: 10 mg/dL (ref 8.4–10.5)
Sodium: 140 mEq/L (ref 135–145)

## 2012-01-06 ENCOUNTER — Encounter: Payer: Self-pay | Admitting: Family

## 2012-03-02 ENCOUNTER — Telehealth: Payer: Self-pay | Admitting: *Deleted

## 2012-03-02 NOTE — Telephone Encounter (Signed)
Pt left message stating she has been having to take more Pepcid to control her GERD symptoms and is getting costly OTC.  States that she can get Nexium free through [facility]. Wants to know if we will prescribe this for her to pick up at [facility] pharmacy tomorrow.  Please advise.

## 2012-03-03 MED ORDER — ESOMEPRAZOLE MAGNESIUM 40 MG PO PACK: 40.0000 mg | PACK | Freq: Every day | ORAL | Status: DC

## 2012-03-03 NOTE — Telephone Encounter (Signed)
Rx sent to [facility] for nexium. This is in place of pepcid.

## 2012-03-03 NOTE — Telephone Encounter (Signed)
Pt.notified

## 2012-05-29 ENCOUNTER — Other Ambulatory Visit: Payer: Self-pay | Admitting: Family

## 2012-07-06 ENCOUNTER — Encounter: Payer: 59 | Admitting: Family

## 2012-07-06 ENCOUNTER — Ambulatory Visit (INDEPENDENT_AMBULATORY_CARE_PROVIDER_SITE_OTHER): Payer: 59 | Admitting: Family

## 2012-07-06 ENCOUNTER — Other Ambulatory Visit: Payer: Self-pay | Admitting: Family

## 2012-07-06 ENCOUNTER — Ambulatory Visit (HOSPITAL_BASED_OUTPATIENT_CLINIC_OR_DEPARTMENT_OTHER)
Admission: RE | Admit: 2012-07-06 | Discharge: 2012-07-06 | Disposition: A | Discharge status: Home / Self Care | Payer: 59 | Source: Ambulatory Visit | Attending: Family | Admitting: Family

## 2012-07-06 ENCOUNTER — Encounter: Payer: Self-pay | Admitting: Family

## 2012-07-06 VITALS — BP 108/70 | HR 81 | Temp 98.5°F | Resp 16 | Ht 63.4 in | Wt 274.1 lb

## 2012-07-06 DIAGNOSIS — Z1231 Encounter for screening mammogram for malignant neoplasm of breast: Secondary | ICD-10-CM | POA: Insufficient documentation

## 2012-07-06 DIAGNOSIS — Z0000 Encounter for general adult medical examination without abnormal findings: Secondary | ICD-10-CM

## 2012-07-06 DIAGNOSIS — Z136 Encounter for screening for cardiovascular disorders: Secondary | ICD-10-CM

## 2012-07-06 LAB — CBC WITH DIFFERENTIAL/PLATELET
Basophils Absolute: 0 10*3/uL (ref 0.0–0.1)
Basophils Relative: 1 % (ref 0–1)
Eosinophils Absolute: 0.2 10*3/uL (ref 0.0–0.7)
Eosinophils Relative: 2 % (ref 0–5)
Lymphocytes Relative: 36 % (ref 12–46)
MCHC: 33.6 g/dL (ref 30.0–36.0)
MCV: 91.6 fL (ref 78.0–100.0)
Monocytes Absolute: 0.4 10*3/uL (ref 0.1–1.0)
Platelets: 232 10*3/uL (ref 150–400)
RDW: 14.3 % (ref 11.5–15.5)
WBC: 8.3 10*3/uL (ref 4.0–10.5)

## 2012-07-06 NOTE — Patient Instructions (Addendum)
Please complete your blood work prior to leaving. Schedule your mammogram on the first floor. Follow up in 3 months, sooner if problems/concerns.

## 2012-07-06 NOTE — Assessment & Plan Note (Signed)
Pt counseled on healthy diet, exercise.  Pt to schedule mammogram on first floor, obtain fasting laboratories.

## 2012-07-06 NOTE — Progress Notes (Signed)
Subjective:    Patient ID: Jordan [name], female    DOB: 1951-12-11, 61 y.o.   MRN: [id number]  HPI  [name] is a 61 yr old female who presents today for her fasting physical. Last colonoscopy 2007. Up to date with DEXA, tetanus and shingles vaccine. Needs mammogram. S/p TAH/BSO. She excising in a pool.     Review of Systems  Constitutional: Negative for unexpected weight change.  HENT: Negative for hearing loss.   Eyes: Negative for visual disturbance.  Respiratory: Negative for shortness of breath.   Cardiovascular: Negative for chest pain and leg swelling.  Gastrointestinal: Negative for nausea, vomiting and diarrhea.  Genitourinary: Negative for dysuria and frequency.  Musculoskeletal:       Reports that joint pain is well controlled.  Skin: Negative for rash.  Neurological: Negative for headaches.       Notes occasional dizziness with movement.    Hematological: Negative for adenopathy.  Psychiatric/Behavioral:       Denies anxiety   Past Medical History  Diagnosis Date  . Gout, unspecified   . Cellulitis and abscess of foot, except toes   . Obesity, unspecified   . Leg edema     chronic  . Cellulitis and abscess of finger, unspecified 401.9  . Depressive disorder, not elsewhere classified   . Insomnia   . Hyperlipidemia     borderline- not on meds  . GERD (gastroesophageal reflux disease)   . Osteoarthritis   . Hyperglycemia 05/08/2011  . Renal insufficiency, mild 05/08/2011    History   Social History  . Marital Status: Single    Spouse Name: N/A    Number of Children: 2  . Years of Education: N/A   Occupational History  .  [employer]   Social History Main Topics  . Smoking status: Former Smoker -- 20 years    Types: Cigarettes    Quit date: 12/23/2008  . Smokeless tobacco: Never Used  . Alcohol Use: Yes     rare  . Drug Use: Not on file  . Sexually Active: Not on file   Other Topics Concern  . Not on file   Social History Narrative  . No  narrative on file    Past Surgical History  Procedure Date  . Appendectomy 2002  . Cholecystectomy 1977  . Total hip arthroplasty 1998  . Ectopic pregnancy surgery 1971  . Abdominal hysterectomy 2002    infection- no history of cancer  . Right shoulder rotator cuff repair 06-07-10    Family History  Problem Relation Age of Onset  . Diabetes Mother   . Cancer Mother     colon/ pancreatic  . Hypertension Father   . Alcohol abuse Father   . Cirrhosis Father   . Other Sister     Pyeoderma gangrenosis  . Obesity Daughter   . Diabetes Sister     type 2    No Known Allergies  Current Outpatient Prescriptions on File Prior to Visit  Medication Sig Dispense Refill  . allopurinol (ZYLOPRIM) 300 MG tablet Take 0.5 tablets (150 mg total) by mouth 2 (two) times daily.  60 tablet  5  . aspirin 81 MG tablet Take 81 mg by mouth daily.        . DULoxetine (CYMBALTA) 60 MG capsule Take 1 capsule (60 mg total) by mouth daily.  30 capsule  5  . furosemide (LASIX) 40 MG tablet Take 1 tablet (40 mg total) by mouth daily as needed.  30 tablet  5  . Multiple Vitamins-Calcium (ONE-A-DAY WOMENS FORMULA) TABS Take 1 tablet by mouth daily.        Marland Kitchen NEXIUM 40 MG capsule TAKE 1 CAPSULE BY MOUTH ONCE DAILY BEFORE BREAKFAST  30 capsule  3  . triamterene-hydrochlorothiazide (DYAZIDE) 37.5-25 MG per capsule Take 1 each (1 capsule total) by mouth every morning.  30 capsule  5  . DISCONTD: famotidine (PEPCID AC) 10 MG chewable tablet Chew 10 mg by mouth daily.          BP 108/70  Pulse 81  Temp 98.5 F (36.9 C) (Oral)  Resp 16  Ht 5' 3.4" (1.61 m)  Wt 274 lb 1.3 oz (124.322 kg)  BMI 47.94 kg/m2  SpO2 95%       Objective:   Physical Exam  Physical Exam  Constitutional: She is oriented to person, place, and time. She appears well-developed and well-nourished. No distress.  HENT:  Head: Normocephalic and atraumatic.  Right Ear: Tympanic membrane and ear canal normal.  Left Ear: Tympanic  membrane and ear canal normal.  Mouth/Throat: Oropharynx is clear and moist.  Eyes: Pupils are equal, round, and reactive to light. No scleral icterus.  Neck: Normal range of motion. No thyromegaly present.  Cardiovascular: Normal rate and regular rhythm.   No murmur heard. Pulmonary/Chest: Effort normal and breath sounds normal. No respiratory distress. He has no wheezes. She has no rales. She exhibits no tenderness.  Abdominal: Soft. Bowel sounds are normal. He exhibits no distension and no mass. There is no tenderness. There is no rebound and no guarding.  Musculoskeletal: She exhibits no edema.  Lymphadenopathy:    She has no cervical adenopathy.  Neurological: She is alert and oriented to person, place, and time. She exhibits normal muscle tone. Coordination normal.  Skin: Skin is warm and dry.  Psychiatric: She has a normal mood and affect. Her behavior is normal. Judgment and thought content normal.  Breasts: Examined lying Right: Without masses, retractions, discharge or axillary adenopathy.  Left: Without masses, retractions, discharge or axillary adenopathy.  GYN: deferred (s/p TAH/BSO)     Assessment & Plan:          Assessment & Plan:

## 2012-07-07 ENCOUNTER — Encounter: Payer: Self-pay | Admitting: Family

## 2012-07-07 LAB — BASIC METABOLIC PANEL WITH GFR
CO2: 27 mEq/L (ref 19–32)
Chloride: 104 mEq/L (ref 96–112)
GFR, Est Non African American: 54 mL/min — ABNORMAL LOW
Sodium: 142 mEq/L (ref 135–145)

## 2012-07-07 LAB — URINALYSIS, ROUTINE W REFLEX MICROSCOPIC
Ketones, ur: NEGATIVE mg/dL
Nitrite: NEGATIVE
Specific Gravity, Urine: 1.01 (ref 1.005–1.030)
Urobilinogen, UA: 0.2 mg/dL (ref 0.0–1.0)

## 2012-07-07 LAB — LIPID PANEL
HDL: 44 mg/dL (ref >39)
Total CHOL/HDL Ratio: 3.5 Ratio
Triglycerides: 154 mg/dL — ABNORMAL HIGH (ref <150)

## 2012-07-07 LAB — TSH: TSH: 1.807 u[IU]/mL (ref 0.350–4.500)

## 2012-07-07 LAB — HEPATIC FUNCTION PANEL
AST: 21 U/L (ref 0–37)
Alkaline Phosphatase: 122 U/L — ABNORMAL HIGH (ref 39–117)
Bilirubin, Direct: 0.1 mg/dL (ref 0.0–0.3)
Total Bilirubin: 0.4 mg/dL (ref 0.3–1.2)

## 2012-09-10 ENCOUNTER — Encounter: Payer: Self-pay | Admitting: Gastroenterology

## 2012-09-18 ENCOUNTER — Ambulatory Visit: Payer: 59 | Admitting: Family

## 2012-10-06 ENCOUNTER — Ambulatory Visit: Payer: 59 | Admitting: Family

## 2012-10-12 ENCOUNTER — Other Ambulatory Visit: Payer: Self-pay | Admitting: Family

## 2012-10-12 NOTE — Telephone Encounter (Signed)
Cymbalta #30 x no refills sent to pharmacy. Please call pt to reschedule her follow up as she will need to be seen for additional refills.

## 2012-10-13 NOTE — Telephone Encounter (Signed)
Spoke to patient and informed her that a 30 day of cymbalta has been sent to pharmacy and that she will need to be seen before additional refills can be given. She states that she is driving back from [place] right now and will have to call back to schedule appointment.

## 2012-11-11 ENCOUNTER — Ambulatory Visit (INDEPENDENT_AMBULATORY_CARE_PROVIDER_SITE_OTHER): Payer: 59 | Admitting: Family

## 2012-11-11 ENCOUNTER — Encounter: Payer: Self-pay | Admitting: Family

## 2012-11-11 VITALS — BP 130/80 | HR 82 | Temp 98.7°F | Resp 16 | Ht 63.5 in | Wt 280.1 lb

## 2012-11-11 DIAGNOSIS — R7309 Other abnormal glucose: Secondary | ICD-10-CM

## 2012-11-11 DIAGNOSIS — F329 Major depressive disorder, single episode, unspecified: Secondary | ICD-10-CM

## 2012-11-11 DIAGNOSIS — M109 Gout, unspecified: Secondary | ICD-10-CM

## 2012-11-11 DIAGNOSIS — J329 Chronic sinusitis, unspecified: Secondary | ICD-10-CM

## 2012-11-11 DIAGNOSIS — I10 Essential (primary) hypertension: Secondary | ICD-10-CM

## 2012-11-11 DIAGNOSIS — R739 Hyperglycemia, unspecified: Secondary | ICD-10-CM

## 2012-11-11 LAB — BASIC METABOLIC PANEL
BUN: 18 mg/dL (ref 6–23)
Potassium: 3.7 mEq/L (ref 3.5–5.3)
Sodium: 139 mEq/L (ref 135–145)

## 2012-11-11 LAB — HEMOGLOBIN A1C
Hgb A1c MFr Bld: 5.5 % (ref <5.7)
Mean Plasma Glucose: 111 mg/dL (ref <117)

## 2012-11-11 MED ORDER — ESOMEPRAZOLE MAGNESIUM 40 MG PO CPDR: 40.0000 mg | DELAYED_RELEASE_CAPSULE | Freq: Every day | ORAL | Status: DC

## 2012-11-11 MED ORDER — AMOXICILLIN-POT CLAVULANATE 875-125 MG PO TABS: 1.0000 | ORAL_TABLET | Freq: Two times a day (BID) | ORAL | Status: DC

## 2012-11-11 MED ORDER — ALLOPURINOL 300 MG PO TABS: 150.0000 mg | ORAL_TABLET | Freq: Two times a day (BID) | ORAL | Status: DC

## 2012-11-11 MED ORDER — DULOXETINE HCL 60 MG PO CPEP: 60.0000 mg | ORAL_CAPSULE | Freq: Every day | ORAL | Status: DC

## 2012-11-11 MED ORDER — FUROSEMIDE 40 MG PO TABS: 40.0000 mg | ORAL_TABLET | Freq: Every day | ORAL | Status: DC | PRN

## 2012-11-11 NOTE — Progress Notes (Signed)
Subjective:    Patient ID: Jordan [name], female    DOB: 1951/05/03, 61 y.o.   MRN: [id number]  HPI  [name] is a 61 yr old female where today for follow up.  Depression-  Reports that she continues cymbalta without side effects. Reports mood is "the best its been."  Gout- no flare ups.    Edema- using furosemide prn.  Especially after working.    Reports yellow sinus drainage x 2 weeks.  + associated cough.  No fever. Review of Systems See HPI  Past Medical History  Diagnosis Date  . Gout, unspecified   . Cellulitis and abscess of foot, except toes   . Obesity, unspecified   . Leg edema     chronic  . Cellulitis and abscess of finger, unspecified 401.9  . Depressive disorder, not elsewhere classified   . Insomnia   . Hyperlipidemia     borderline- not on meds  . GERD (gastroesophageal reflux disease)   . Osteoarthritis   . Hyperglycemia 05/08/2011  . Renal insufficiency, mild 05/08/2011    History   Social History  . Marital Status: Single    Spouse Name: N/A    Number of Children: 2  . Years of Education: N/A   Occupational History  .     Social History Main Topics  . Smoking status: Former Smoker -- 20 years    Types: Cigarettes    Quit date: 12/23/2008  . Smokeless tobacco: Never Used  . Alcohol Use: Yes     Comment: rare  . Drug Use: Not on file  . Sexually Active: Not on file   Other Topics Concern  . Not on file   Social History Narrative  . No narrative on file    Past Surgical History  Procedure Date  . Appendectomy 2002  . Cholecystectomy 1977  . Total hip arthroplasty 1998  . Ectopic pregnancy surgery 1971  . Abdominal hysterectomy 2002    infection- no history of cancer  . Right shoulder rotator cuff repair 06-07-10    Family History  Problem Relation Age of Onset  . Diabetes Mother   . Cancer Mother     colon/ pancreatic  . Hypertension Father   . Alcohol abuse Father   . Cirrhosis Father   . Other Sister    Pyeoderma gangrenosis  . Obesity Daughter   . Diabetes Sister     type 2    No Known Allergies  Current Outpatient Prescriptions on File Prior to Visit  Medication Sig Dispense Refill  . allopurinol (ZYLOPRIM) 300 MG tablet Take 0.5 tablets (150 mg total) by mouth 2 (two) times daily.  60 tablet  5  . aspirin 81 MG tablet Take 81 mg by mouth daily.        . CYMBALTA 60 MG capsule TAKE 1 CAPSULE BY MOUTH DAILY.  30 capsule  0  . furosemide (LASIX) 40 MG tablet Take 1 tablet (40 mg total) by mouth daily as needed.  30 tablet  5  . Multiple Vitamins-Calcium (ONE-A-DAY WOMENS FORMULA) TABS Take 1 tablet by mouth daily.        Marland Kitchen NEXIUM 40 MG capsule TAKE 1 CAPSULE BY MOUTH ONCE DAILY BEFORE BREAKFAST  30 capsule  3  . triamterene-hydrochlorothiazide (DYAZIDE) 37.5-25 MG per capsule Take 1 each (1 capsule total) by mouth every morning.  30 capsule  5  . [DISCONTINUED] famotidine (PEPCID AC) 10 MG chewable tablet Chew 10 mg by mouth daily.  BP 130/80  Pulse 82  Temp 98.7 F (37.1 C) (Oral)  Resp 16  Ht 5' 3.5" (1.613 m)  Wt 280 lb 1.3 oz (127.043 kg)  BMI 48.84 kg/m2  SpO2 99%       Objective:   Physical Exam  Constitutional: She appears well-developed and well-nourished. No distress.  HENT:  Head: Normocephalic and atraumatic.  Right Ear: Tympanic membrane and ear canal normal.  Left Ear: Tympanic membrane and ear canal normal.  Cardiovascular: Normal rate and regular rhythm.   No murmur heard. Pulmonary/Chest: Effort normal and breath sounds normal. No respiratory distress. She has no wheezes. She has no rales. She exhibits no tenderness.  Musculoskeletal: She exhibits no edema.  Psychiatric: She has a normal mood and affect. Her behavior is normal. Judgment and thought content normal.          Assessment & Plan:

## 2012-11-11 NOTE — Patient Instructions (Addendum)
Please complete your lab work prior to leaving. Please schedule a follow up appointment in 6 months.   

## 2012-11-11 NOTE — Assessment & Plan Note (Signed)
BP Readings from Last 3 Encounters:  11/11/12 130/80  07/06/12 108/70  01/03/12 110/80   BP stable on dyazide.  Monitor.

## 2012-11-11 NOTE — Assessment & Plan Note (Signed)
Rx with Augmentin.  

## 2012-11-11 NOTE — Assessment & Plan Note (Signed)
Stable on cymbalta, continue same.   

## 2012-11-11 NOTE — Assessment & Plan Note (Signed)
No flare ups. 

## 2012-11-13 ENCOUNTER — Encounter: Payer: Self-pay | Admitting: Family

## 2013-01-14 ENCOUNTER — Telehealth: Payer: Self-pay | Admitting: Family

## 2013-01-14 NOTE — Telephone Encounter (Signed)
pls let pt know that protonix with be free per [facility] and nexium will be 25 dollars. If she wants to switch can send #30 with 5 refills

## 2013-01-15 NOTE — Telephone Encounter (Signed)
Called pt no answer LMOM RTC.../lmb 

## 2013-01-20 NOTE — Telephone Encounter (Signed)
Left detailed message on cell# re: formulary change and for pt to call and let us know which medication she is going to take. Authorization to fill protonix 40mg  1 tablet daily #90 x no refills faxed to [facility] [phone].

## 2013-01-21 NOTE — Addendum Note (Signed)
Addended by: Mervin Kung A on: 01/21/2013 02:13 PM   Modules accepted: Orders

## 2013-01-21 NOTE — Telephone Encounter (Signed)
Pt left message that she is going to try the Protonix but if it does not work well then she is going to go back to Nexium.

## 2013-01-27 ENCOUNTER — Other Ambulatory Visit: Payer: Self-pay | Admitting: Family

## 2013-04-27 ENCOUNTER — Other Ambulatory Visit: Payer: Self-pay | Admitting: Family

## 2013-04-27 NOTE — Telephone Encounter (Signed)
Rx sent in to pharmacy. 

## 2013-05-11 ENCOUNTER — Other Ambulatory Visit: Payer: Self-pay | Admitting: Family

## 2013-05-11 NOTE — Telephone Encounter (Signed)
Refill request for Cymbalta Last filled by MD on -11/11/12 #30 x5 Last seen-11/11/12 F/U-05/12/13 Please advise refill?

## 2013-05-12 ENCOUNTER — Encounter: Payer: Self-pay | Admitting: Family

## 2013-05-12 ENCOUNTER — Ambulatory Visit (INDEPENDENT_AMBULATORY_CARE_PROVIDER_SITE_OTHER): Payer: 59 | Admitting: Family

## 2013-05-12 VITALS — BP 118/78 | HR 96 | Temp 98.4°F | Resp 16 | Ht 63.5 in | Wt 271.0 lb

## 2013-05-12 DIAGNOSIS — R748 Abnormal levels of other serum enzymes: Secondary | ICD-10-CM

## 2013-05-12 DIAGNOSIS — R739 Hyperglycemia, unspecified: Secondary | ICD-10-CM

## 2013-05-12 DIAGNOSIS — F329 Major depressive disorder, single episode, unspecified: Secondary | ICD-10-CM

## 2013-05-12 DIAGNOSIS — R7309 Other abnormal glucose: Secondary | ICD-10-CM

## 2013-05-12 DIAGNOSIS — I10 Essential (primary) hypertension: Secondary | ICD-10-CM

## 2013-05-12 LAB — HEPATIC FUNCTION PANEL
ALT: 28 U/L (ref 0–35)
Bilirubin, Direct: 0.1 mg/dL (ref 0.0–0.3)

## 2013-05-12 LAB — BASIC METABOLIC PANEL
BUN: 18 mg/dL (ref 6–23)
Calcium: 9.9 mg/dL (ref 8.4–10.5)
Glucose, Bld: 130 mg/dL — ABNORMAL HIGH (ref 70–99)
Sodium: 140 mEq/L (ref 135–145)

## 2013-05-12 LAB — HEMOGLOBIN A1C
Hgb A1c MFr Bld: 5.7 % — ABNORMAL HIGH (ref <5.7)
Mean Plasma Glucose: 117 mg/dL — ABNORMAL HIGH (ref <117)

## 2013-05-12 MED ORDER — FUROSEMIDE 40 MG PO TABS: 40.0000 mg | ORAL_TABLET | Freq: Every day | ORAL | Status: DC | PRN

## 2013-05-12 MED ORDER — DULOXETINE HCL 60 MG PO CPEP: ORAL_CAPSULE | ORAL | Status: DC

## 2013-05-12 MED ORDER — TRIAMTERENE-HCTZ 37.5-25 MG PO CAPS: 1.0000 | ORAL_CAPSULE | ORAL | Status: DC

## 2013-05-12 MED ORDER — PANTOPRAZOLE SODIUM 40 MG PO TBEC: 40.0000 mg | DELAYED_RELEASE_TABLET | Freq: Every day | ORAL | Status: DC

## 2013-05-12 NOTE — Progress Notes (Signed)
Subjective:    Patient ID: Jordan [name], female    DOB: 1951/03/01, 62 y.o.   MRN: [id number]  HPI  Jordan [name] a 62 yr old female who presents today for follow up.  1) HTN- on triamterene HCTZ, Uses lasix prn- generally alternates the two diuretics every other day.  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    2) Depression- reports good mood.  Recently brought her sister here from [place].  Sister has been hospitalized x 2 since she moved here due to multiple medical problems.  She continues cymbalta.   3) Hyperglycemia- last A1C was 5.5  Review of Systems    see HPI  Past Medical History  Diagnosis Date  . Gout, unspecified   . Cellulitis and abscess of foot, except toes   . Obesity, unspecified   . Leg edema     chronic  . Cellulitis and abscess of finger, unspecified 401.9  . Depressive disorder, not elsewhere classified   . Insomnia   . Hyperlipidemia     borderline- not on meds  . GERD (gastroesophageal reflux disease)   . Osteoarthritis   . Hyperglycemia 05/08/2011  . Renal insufficiency, mild 05/08/2011    History   Social History  . Marital Status: Single    Spouse Name: N/A    Number of Children: 2  . Years of Education: N/A   Occupational History  .  [employer]   Social History Main Topics  . Smoking status: Current Some Day Smoker -- 20 years    Types: Cigarettes    Last Attempt to Quit: 12/23/2008  . Smokeless tobacco: Never Used  . Alcohol Use: Yes     Comment: rare  . Drug Use: Not on file  . Sexually Active: Not on file   Other Topics Concern  . Not on file   Social History Narrative  . No narrative on file    Past Surgical History  Procedure Laterality Date  . Appendectomy  2002  . Cholecystectomy  1977  . Total hip arthroplasty  1998  . Ectopic pregnancy surgery  1971  . Abdominal hysterectomy  2002    infection- no history of cancer  . Right shoulder rotator cuff repair  06-07-10    Family History   Problem Relation Age of Onset  . Diabetes Mother   . Cancer Mother     colon/ pancreatic  . Hypertension Father   . Alcohol abuse Father   . Cirrhosis Father   . Other Sister     Pyeoderma gangrenosis  . Obesity Daughter   . Diabetes Sister     type 2    No Known Allergies  Current Outpatient Prescriptions on File Prior to Visit  Medication Sig Dispense Refill  . allopurinol (ZYLOPRIM) 300 MG tablet Take 0.5 tablets (150 mg total) by mouth 2 (two) times daily.  60 tablet  5  . aspirin 81 MG tablet Take 81 mg by mouth daily.        . Multiple Vitamins-Calcium (ONE-A-DAY WOMENS FORMULA) TABS Take 1 tablet by mouth daily.        . [DISCONTINUED] famotidine (PEPCID AC) 10 MG chewable tablet Chew 10 mg by mouth daily.         No current facility-administered medications on file prior to visit.    BP 118/78  Pulse 96  Temp(Src) 98.4 F (36.9 C) (Oral)  Resp 16  Ht 5' 3.5" (1.613 m)  Wt 271 lb (  122.925 kg)  BMI 47.25 kg/m2  SpO2 96%    Objective:   Physical Exam  Constitutional: She is oriented to person, place, and time. She appears well-developed and well-nourished. No distress.  Cardiovascular: Normal rate and regular rhythm.   No murmur heard. Pulmonary/Chest: Effort normal and breath sounds normal. No respiratory distress. She has no wheezes. She has no rales. She exhibits no tenderness.  Musculoskeletal: She exhibits no edema.  Neurological: She is alert and oriented to person, place, and time.  Psychiatric: She has a normal mood and affect. Her behavior is normal. Judgment and thought content normal.          Assessment & Plan:

## 2013-05-12 NOTE — Patient Instructions (Addendum)
Please complete lab work prior to leaving. Follow up in 6 months for a fasting physical.

## 2013-05-13 ENCOUNTER — Encounter: Payer: Self-pay | Admitting: Family

## 2013-05-13 NOTE — Assessment & Plan Note (Signed)
Obtain A1C. 

## 2013-05-13 NOTE — Assessment & Plan Note (Signed)
BP Readings from Last 3 Encounters:  05/12/13 118/78  11/11/12 130/80  07/06/12 108/70   Stable. She is alternating hctz with triamterene hctz and this seems to be working well for her BP and her edema. Obtain bmet.

## 2013-05-13 NOTE — Assessment & Plan Note (Signed)
Stable on cymbalta. Continue the same.

## 2013-10-11 ENCOUNTER — Other Ambulatory Visit: Payer: Self-pay | Admitting: Family

## 2013-10-25 ENCOUNTER — Other Ambulatory Visit: Payer: Self-pay | Admitting: Family

## 2013-10-25 ENCOUNTER — Encounter: Payer: Self-pay | Admitting: Family

## 2013-10-25 ENCOUNTER — Ambulatory Visit (INDEPENDENT_AMBULATORY_CARE_PROVIDER_SITE_OTHER): Payer: 59 | Admitting: Family

## 2013-10-25 ENCOUNTER — Ambulatory Visit (HOSPITAL_BASED_OUTPATIENT_CLINIC_OR_DEPARTMENT_OTHER)
Admission: RE | Admit: 2013-10-25 | Discharge: 2013-10-25 | Disposition: A | Discharge status: Home / Self Care | Payer: 59 | Source: Ambulatory Visit | Attending: Family | Admitting: Family

## 2013-10-25 VITALS — BP 128/80 | HR 68 | Temp 98.0°F | Resp 16 | Ht 63.25 in | Wt 258.0 lb

## 2013-10-25 DIAGNOSIS — R739 Hyperglycemia, unspecified: Secondary | ICD-10-CM

## 2013-10-25 DIAGNOSIS — Z136 Encounter for screening for cardiovascular disorders: Secondary | ICD-10-CM

## 2013-10-25 DIAGNOSIS — Z1231 Encounter for screening mammogram for malignant neoplasm of breast: Secondary | ICD-10-CM

## 2013-10-25 DIAGNOSIS — R7309 Other abnormal glucose: Secondary | ICD-10-CM

## 2013-10-25 DIAGNOSIS — Z0000 Encounter for general adult medical examination without abnormal findings: Secondary | ICD-10-CM

## 2013-10-25 LAB — CBC WITH DIFFERENTIAL/PLATELET
Lymphocytes Relative: 39 % (ref 12–46)
Lymphs Abs: 3.2 10*3/uL (ref 0.7–4.0)
Neutro Abs: 4.1 10*3/uL (ref 1.7–7.7)
Neutrophils Relative %: 51 % (ref 43–77)
Platelets: 239 10*3/uL (ref 150–400)
RBC: 4.45 MIL/uL (ref 3.87–5.11)
WBC: 8.1 10*3/uL (ref 4.0–10.5)

## 2013-10-25 LAB — BASIC METABOLIC PANEL WITH GFR
CO2: 30 mEq/L (ref 19–32)
Calcium: 9.9 mg/dL (ref 8.4–10.5)
GFR, Est African American: 66 mL/min
GFR, Est Non African American: 57 mL/min — ABNORMAL LOW
Sodium: 140 mEq/L (ref 135–145)

## 2013-10-25 LAB — LIPID PANEL
Cholesterol: 163 mg/dL (ref 0–200)
LDL Cholesterol: 82 mg/dL (ref 0–99)
Triglycerides: 196 mg/dL — ABNORMAL HIGH (ref <150)

## 2013-10-25 LAB — HEPATIC FUNCTION PANEL
Alkaline Phosphatase: 106 U/L (ref 39–117)
Indirect Bilirubin: 0.2 mg/dL (ref 0.0–0.9)
Total Bilirubin: 0.3 mg/dL (ref 0.3–1.2)

## 2013-10-25 LAB — HEMOGLOBIN A1C
Hgb A1c MFr Bld: 6 % — ABNORMAL HIGH (ref <5.7)
Mean Plasma Glucose: 126 mg/dL — ABNORMAL HIGH (ref <117)

## 2013-10-25 NOTE — Progress Notes (Signed)
Subjective:    Patient ID: Jordan [name], female    DOB: Dec 07, 1951, 62 y.o.   MRN: [id number]  HPI  Jordan [name] is a 62 yr old female who presents today for cpx.  Immunizations:  Up to date Diet:  Reports diet is fair.   Exercise: not exercising regularly. Has lost 22 pounds in the last 1 year. Colonoscopy: reports that she was told to follow up in 5 years.  Was done at [facility].   Dexa: 2012- normal. 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   Musculoskeletal:       Reports some right lower abdominal pain which she noted after sitting on exercise ball x 8 hrs.  Has improved.    Past Medical History  Diagnosis Date  . Gout, unspecified   . Cellulitis and abscess of foot, except toes   . Obesity, unspecified   . Leg edema     chronic  . Cellulitis and abscess of finger, unspecified 401.9  . Depressive disorder, not elsewhere classified   . Insomnia   . Hyperlipidemia     borderline- not on meds  . GERD (gastroesophageal reflux disease)   . Osteoarthritis   . Hyperglycemia 05/08/2011  . Renal insufficiency, mild 05/08/2011    History   Social History  . Marital Status: Single    Spouse Name: N/A    Number of Children: 2  . Years of Education: N/A   Occupational History  .  [employer]   Social History Main Topics  . Smoking status: Current Some Day Smoker -- 20 years    Types: Cigarettes    Last Attempt to Quit: 12/23/2008  . Smokeless tobacco: Never Used  . Alcohol Use: Yes     Comment: rare  . Drug Use: Not on file  . Sexual Activity: Not on file   Other Topics Concern  . Not on file   Social History Narrative  . No narrative on file    Past Surgical History  Procedure Laterality Date  . Appendectomy  2002  . Cholecystectomy  1977  . Total hip arthroplasty  1998  . Ectopic pregnancy surgery  1971  . Abdominal hysterectomy   2002    infection- no history of cancer  . Right shoulder rotator cuff repair  06-07-10    Family History  Problem Relation Age of Onset  . Diabetes Mother   . Cancer Mother     colon/ pancreatic  . Hypertension Father   . Alcohol abuse Father   . Cirrhosis Father   . Other Sister     Pyeoderma gangrenosis  . Obesity Daughter   . Diabetes Sister     type 2  . Heart attack Brother     No Known Allergies  Current Outpatient Prescriptions on File Prior to Visit  Medication Sig Dispense Refill  . allopurinol (ZYLOPRIM) 300 MG tablet Take 0.5 tablets (150 mg total) by mouth 2 (two) times daily.  60 tablet  5  . aspirin 81 MG tablet Take 81 mg by mouth daily.        . DULoxetine (CYMBALTA) 60 MG capsule One tablet by mouth daily  30 capsule  5  . furosemide (LASIX) 40 MG tablet Take 1 tablet (40 mg total) by mouth daily as needed.  30 tablet  5  . Multiple Vitamins-Calcium (ONE-A-DAY WOMENS  FORMULA) TABS Take 1 tablet by mouth daily.        . pantoprazole (PROTONIX) 40 MG tablet TAKE 1 TABLET BY MOUTH ONCE DAILY  90 tablet  0  . triamterene-hydrochlorothiazide (DYAZIDE) 37.5-25 MG per capsule Take 1 each (1 capsule total) by mouth every morning.  30 capsule  5  . [DISCONTINUED] famotidine (PEPCID AC) 10 MG chewable tablet Chew 10 mg by mouth daily.         No current facility-administered medications on file prior to visit.    BP 128/80  Pulse 68  Temp(Src) 98 F (36.7 C) (Oral)  Resp 16  Ht 5' 3.25" (1.607 m)  Wt 258 lb 0.6 oz (117.046 kg)  BMI 45.32 kg/m2  SpO2 97%       Objective:   Physical Exam  Physical Exam  Constitutional: She is oriented to person, place, and time. She appears well-developed and well-nourished. No distress.  HENT:  Head: Normocephalic and atraumatic.  Right Ear: Tympanic membrane and ear canal normal.  Left Ear: Tympanic membrane and ear canal normal.  Mouth/Throat: Oropharynx is clear and moist.  Eyes: Pupils are equal, round, and  reactive to light. No scleral icterus.  Neck: Normal range of motion. No thyromegaly present.  Cardiovascular: Normal rate and regular rhythm.   No murmur heard. Pulmonary/Chest: Effort normal and breath sounds normal. No respiratory distress. He has no wheezes. She has no rales. She exhibits no tenderness.  Abdominal: Soft. Bowel sounds are normal. He exhibits no distension and no mass. There is no tenderness. There is no rebound and no guarding.  Musculoskeletal: She exhibits no edema.  Lymphadenopathy:    She has no cervical adenopathy.  Neurological: She is alert and oriented to person, place, and time. She has normal reflexes. She exhibits normal muscle tone. Coordination normal.  Skin: Skin is warm and dry.  Psychiatric: She has a normal mood and affect. Her behavior is normal. Judgment and thought content normal.  Breasts: Examined lying Right: Without masses, nipple inverted, discharge or axillary adenopathy.  Left: Without masses, nipple inverted, discharge or axillary adenopathy.        Assessment & Plan:         Assessment & Plan:

## 2013-10-25 NOTE — Assessment & Plan Note (Signed)
Pt counseled on smoking cessation, weight loss, exercise, BSE.  Obtain fasting labs, refer for colo.  Immunizations up to date.  Plan follow up dexa next year.  Mammogram ordered.

## 2013-10-25 NOTE — Patient Instructions (Signed)
You will be contacted about your referral for colonoscopy. Schedule mammogram on the first floor. Complete fasting lab work. Try to get 30 minutes of exercise 5 days a week. Follow up in 6 months.

## 2013-10-26 ENCOUNTER — Encounter: Payer: Self-pay | Admitting: Family

## 2013-10-26 LAB — URINALYSIS, ROUTINE W REFLEX MICROSCOPIC
Bilirubin Urine: NEGATIVE
Leukocytes, UA: NEGATIVE
Nitrite: NEGATIVE
Specific Gravity, Urine: 1.01 (ref 1.005–1.030)
Urobilinogen, UA: 0.2 mg/dL (ref 0.0–1.0)

## 2013-11-03 ENCOUNTER — Encounter: Payer: Self-pay | Admitting: Gastroenterology

## 2013-12-31 ENCOUNTER — Ambulatory Visit (AMBULATORY_SURGERY_CENTER): Payer: Self-pay | Admitting: *Deleted

## 2013-12-31 VITALS — Ht 64.0 in | Wt 264.0 lb

## 2013-12-31 DIAGNOSIS — Z800 Family history of malignant neoplasm of digestive organs: Secondary | ICD-10-CM

## 2013-12-31 MED ORDER — NA SULFATE-K SULFATE-MG SULF 17.5-3.13-1.6 GM/177ML PO SOLN: 1.0000 | Freq: Once | ORAL | Status: DC

## 2013-12-31 NOTE — Progress Notes (Signed)
No allergies to eggs or soy. No problems with anesthesia.  

## 2014-01-06 ENCOUNTER — Other Ambulatory Visit: Payer: Self-pay | Admitting: Family

## 2014-01-06 NOTE — Telephone Encounter (Signed)
Rx request to pharmacy/SLS  

## 2014-01-14 ENCOUNTER — Encounter: Payer: Self-pay | Admitting: Gastroenterology

## 2014-01-14 ENCOUNTER — Ambulatory Visit (AMBULATORY_SURGERY_CENTER): Payer: 59 | Admitting: Gastroenterology

## 2014-01-14 VITALS — BP 134/77 | HR 63 | Temp 98.4°F | Resp 17 | Ht 64.0 in | Wt 269.0 lb

## 2014-01-14 DIAGNOSIS — Z800 Family history of malignant neoplasm of digestive organs: Secondary | ICD-10-CM

## 2014-01-14 DIAGNOSIS — K5730 Diverticulosis of large intestine without perforation or abscess without bleeding: Secondary | ICD-10-CM

## 2014-01-14 DIAGNOSIS — Z1211 Encounter for screening for malignant neoplasm of colon: Secondary | ICD-10-CM

## 2014-01-14 MED ORDER — SODIUM CHLORIDE 0.9 % IV SOLN: 500.0000 mL | INTRAVENOUS | Status: DC

## 2014-01-14 NOTE — Patient Instructions (Signed)
Impressions/recommendations:  Diverticulosis (handout given) High fiber diet (handout given)  YOU HAD AN ENDOSCOPIC PROCEDURE TODAY AT [facility]: Refer to the procedure report that was given to you for any specific questions about what was found during the examination.  If the procedure report does not answer your questions, please call your gastroenterologist to clarify.  If you requested that your care partner not be given the details of your procedure findings, then the procedure report has been included in a sealed envelope for you to review at your convenience later.  YOU SHOULD EXPECT: Some feelings of bloating in the abdomen. Passage of more gas than usual.  Walking can help get rid of the air that was put into your GI tract during the procedure and reduce the bloating. If you had a lower endoscopy (such as a colonoscopy or flexible sigmoidoscopy) you may notice spotting of blood in your stool or on the toilet paper. If you underwent a bowel prep for your procedure, then you may not have a normal bowel movement for a few days.  DIET: Your first meal following the procedure should be a light meal and then it is ok to progress to your normal diet.  A half-sandwich or bowl of soup is an example of a good first meal.  Heavy or fried foods are harder to digest and may make you feel nauseous or bloated.  Likewise meals heavy in dairy and vegetables can cause extra gas to form and this can also increase the bloating.  Drink plenty of fluids but you should avoid alcoholic beverages for 24 hours.  ACTIVITY: Your care partner should take you home directly after the procedure.  You should plan to take it easy, moving slowly for the rest of the day.  You can resume normal activity the day after the procedure however you should NOT DRIVE or use heavy machinery for 24 hours (because of the sedation medicines used during the test).    SYMPTOMS TO REPORT IMMEDIATELY: A gastroenterologist can  be reached at any hour.  During normal business hours, 8:30 AM to 5:00 PM Monday through Friday, call [phone].  After hours and on weekends, please call the GI answering service at [phone] who will take a message and have the physician on call contact you.   Following lower endoscopy (colonoscopy or flexible sigmoidoscopy):  Excessive amounts of blood in the stool  Significant tenderness or worsening of abdominal pains  Swelling of the abdomen that is new, acute  Fever of 100F or higher  FOLLOW UP: If any biopsies were taken you will be contacted by phone or by letter within the next 1-3 weeks.  Call your gastroenterologist if you have not heard about the biopsies in 3 weeks.  Our staff will call the home number listed on your records the next business day following your procedure to check on you and address any questions or concerns that you may have at that time regarding the information given to you following your procedure. This is a courtesy call and so if there is no answer at the home number and we have not heard from you through the emergency physician on call, we will assume that you have returned to your regular daily activities without incident.  SIGNATURES/CONFIDENTIALITY: You and/or your care partner have signed paperwork which will be entered into your electronic medical record.  These signatures attest to the fact that that the information above on your After Visit Summary has been reviewed  and is understood.  Full responsibility of the confidentiality of this discharge information lies with you and/or your care-partner. 

## 2014-01-14 NOTE — Op Note (Signed)
[facility]  [address] [place], [address]   COLONOSCOPY PROCEDURE REPORT  PATIENT: Jordan, [name]  MR#: [id number] BIRTHDATE: March 12, 1951 , 29  yrs. old GENDER: Female ENDOSCOPIST: Inda Castle, MD REFERRED BY: PROCEDURE DATE:  01/14/2014 PROCEDURE:   Colonoscopy, diagnostic First Screening Colonoscopy - Avg.  risk and is 50 yrs.  old or older - No.  Prior Negative Screening - Now for repeat screening. Above average risk  History of Adenoma - Now for follow-up colonoscopy & has been > or = to 3 yrs.  N/A  Polyps Removed Today? No.  Recommend repeat exam, <10 yrs? Yes.  High risk (family or personal hx). ASA CLASS:   Class II INDICATIONS:Patient's immediate family history of colon cancer. MEDICATIONS: MAC sedation, administered by CRNA and propofol (Diprivan) 350mg  IV  DESCRIPTION OF PROCEDURE:   After the risks benefits and alternatives of the procedure were thoroughly explained, informed consent was obtained.  A digital rectal exam revealed no abnormalities of the rectum.   The LB [id number]  endoscope was introduced through the anus and advanced to the cecum, which was identified by both the appendix and ileocecal valve. No adverse events experienced.   The quality of the prep was Suprep good  The instrument was then slowly withdrawn as the colon was fully examined.      COLON FINDINGS: There was severe diverticulosis noted in the sigmoid colon with associated muscular hypertrophy, tortuosity and angulation.   The colon was otherwise normal.  There was no diverticulosis, inflammation, polyps or cancers unless previously stated.  Retroflexed views revealed no abnormalities. The time to cecum=10 minutes 31 seconds.  Withdrawal time=8 minutes 29 seconds. The scope was withdrawn and the procedure completed. COMPLICATIONS: There were no complications.  ENDOSCOPIC IMPRESSION: 1.   There was severe diverticulosis noted in the sigmoid colon 2.    The colon was otherwise normal  RECOMMENDATIONS: Given your significant family history of colon cancer, you should have a repeat colonoscopy in 5 years   eSigned:  Inda Castle, MD 01/14/2014 11:15 AM   cc: [name], MD   PATIENT NAME:  Jordan, [name] MR#: [id number]

## 2014-01-17 ENCOUNTER — Telehealth: Payer: Self-pay | Admitting: *Deleted

## 2014-01-17 NOTE — Telephone Encounter (Signed)
  Follow up Call-  Call back number 01/14/2014  Post procedure Call Back phone  # [phone]  Permission to leave phone message Yes     Patient questions:  Do you have a fever, pain , or abdominal swelling? no Pain Score  0 *  Have you tolerated food without any problems? yes  Have you been able to return to your normal activities? yes  Do you have any questions about your discharge instructions: Diet   no Medications  no Follow up visit  no  Do you have questions or concerns about your Care? no  Actions: * If pain score is 4 or above: No action needed, pain <4.

## 2014-02-14 ENCOUNTER — Other Ambulatory Visit: Payer: Self-pay | Admitting: Family

## 2014-03-28 ENCOUNTER — Other Ambulatory Visit: Payer: Self-pay | Admitting: Family

## 2014-04-25 ENCOUNTER — Encounter: Payer: Self-pay | Admitting: Family

## 2014-04-25 ENCOUNTER — Telehealth: Payer: Self-pay | Admitting: Family

## 2014-04-25 ENCOUNTER — Ambulatory Visit (INDEPENDENT_AMBULATORY_CARE_PROVIDER_SITE_OTHER): Payer: 59 | Admitting: Family

## 2014-04-25 VITALS — BP 136/86 | HR 71 | Temp 97.9°F | Resp 16 | Ht 63.25 in | Wt 272.0 lb

## 2014-04-25 DIAGNOSIS — M109 Gout, unspecified: Secondary | ICD-10-CM

## 2014-04-25 DIAGNOSIS — E119 Type 2 diabetes mellitus without complications: Secondary | ICD-10-CM

## 2014-04-25 DIAGNOSIS — R739 Hyperglycemia, unspecified: Secondary | ICD-10-CM

## 2014-04-25 DIAGNOSIS — I10 Essential (primary) hypertension: Secondary | ICD-10-CM

## 2014-04-25 DIAGNOSIS — R7309 Other abnormal glucose: Secondary | ICD-10-CM

## 2014-04-25 LAB — BASIC METABOLIC PANEL
BUN: 25 mg/dL — ABNORMAL HIGH (ref 6–23)
CO2: 28 meq/L (ref 19–32)
CREATININE: 1.08 mg/dL (ref 0.50–1.10)
Calcium: 9.6 mg/dL (ref 8.4–10.5)
Chloride: 103 mEq/L (ref 96–112)
Glucose, Bld: 94 mg/dL (ref 70–99)
Potassium: 4.2 mEq/L (ref 3.5–5.3)
SODIUM: 141 meq/L (ref 135–145)

## 2014-04-25 NOTE — Progress Notes (Signed)
Subjective:    Patient ID: Jordan [name], female    DOB: 12-18-1951, 63 y.o.   MRN: [id number]  HPI  [name] is 63 yr old female who presents today for follow up of multiple medical problems:  Depression- continues cymbalta.  Reports that her mood is good. She is still caring for her sister who is in a wheelchair. Looking forward to retiring when she is 57.  Gout-  On allopurinol. No flare ups.  Hyperglycemia-   Lab Results  Component Value Date   HGBA1C 6.0* 10/25/2013   HTN- Denies CP/SOB.  Mild occasional LE edema. Uses lasix 2-3 times a week after she has worked to help with LE edema.  BP Readings from Last 3 Encounters:  04/25/14 136/86  01/14/14 134/77  10/25/13 128/80    Review of Systems    see HPI  Past Medical History  Diagnosis Date  . Gout, unspecified   . Cellulitis and abscess of foot, except toes   . Obesity, unspecified   . Leg edema     chronic  . Cellulitis and abscess of finger, unspecified 401.9  . Depressive disorder, not elsewhere classified   . Insomnia   . Hyperlipidemia     borderline- not on meds  . GERD (gastroesophageal reflux disease)   . Osteoarthritis   . Hyperglycemia 05/08/2011  . Renal insufficiency, mild 05/08/2011    History   Social History  . Marital Status: Single    Spouse Name: N/A    Number of Children: 2  . Years of Education: N/A   Occupational History  .  [employer]   Social History Main Topics  . Smoking status: Current Every Day Smoker -- 0.25 packs/day    Types: Cigarettes  . Smokeless tobacco: Never Used  . Alcohol Use: Yes     Comment: rare  . Drug Use: No  . Sexual Activity: Not on file   Other Topics Concern  . Not on file   Social History Narrative  . No narrative on file    Past Surgical History  Procedure Laterality Date  . Appendectomy  2002  . Cholecystectomy  1977  . Total hip arthroplasty Left 1998  . Ectopic pregnancy surgery  1971  . Abdominal hysterectomy  2002    infection-  no history of cancer  . Right shoulder rotator cuff repair  06-07-10    Family History  Problem Relation Age of Onset  . Diabetes Mother   . Cancer Mother     colon/ pancreatic  . Colon cancer Mother 108  . Hypertension Father   . Alcohol abuse Father   . Cirrhosis Father   . Other Sister     Pyeoderma gangrenosis  . Obesity Daughter   . Diabetes Sister     type 2  . Heart attack Brother     No Known Allergies  Current Outpatient Prescriptions on File Prior to Visit  Medication Sig Dispense Refill  . allopurinol (ZYLOPRIM) 300 MG tablet TAKE 1/2 TABLET BY MOUTH 2 TIMES DAILY.  90 tablet  0  . aspirin 81 MG tablet Take 81 mg by mouth daily.        . DULoxetine (CYMBALTA) 60 MG capsule TAKE 1 CAPSULE BY MOUTH DAILY  30 capsule  3  . furosemide (LASIX) 40 MG tablet TAKE 1 TABLET BY MOUTH ONCE DAILY AS NEEDED  30 tablet  3  . Multiple Vitamins-Calcium (ONE-A-DAY WOMENS FORMULA) TABS Take 1 tablet by mouth daily.        Marland Kitchen  pantoprazole (PROTONIX) 40 MG tablet TAKE 1 TABLET BY MOUTH ONCE DAILY  90 tablet  0  . triamterene-hydrochlorothiazide (DYAZIDE) 37.5-25 MG per capsule TAKE 1 CAPSULE BY MOUTH EVERY MORNING  30 capsule  3  . [DISCONTINUED] famotidine (PEPCID AC) 10 MG chewable tablet Chew 10 mg by mouth daily.         No current facility-administered medications on file prior to visit.    BP 136/86  Pulse 71  Temp(Src) 97.9 F (36.6 C) (Oral)  Resp 16  Ht 5' 3.25" (1.607 m)  Wt 272 lb 0.6 oz (123.397 kg)  BMI 47.78 kg/m2  SpO2 99%    Objective:   Physical Exam  Constitutional: She is oriented to person, place, and time. She appears well-developed and well-nourished. No distress.  HENT:  Head: Normocephalic and atraumatic.  Cardiovascular: Normal rate and regular rhythm.   No murmur heard. Pulmonary/Chest: Effort normal and breath sounds normal. No respiratory distress. She has no wheezes. She has no rales. She exhibits no tenderness.  Neurological: She is alert and  oriented to person, place, and time.  Psychiatric: She has a normal mood and affect. Her behavior is normal. Judgment and thought content normal.          Assessment & Plan:

## 2014-04-25 NOTE — Telephone Encounter (Signed)
Relevant patient education assigned to patient using Emmi. ° °

## 2014-04-25 NOTE — Assessment & Plan Note (Signed)
Check A1C 

## 2014-04-25 NOTE — Assessment & Plan Note (Signed)
Clinically stable on allopurinol. Continue same.  

## 2014-04-25 NOTE — Assessment & Plan Note (Signed)
BP stable on current meds.  BP is stable.

## 2014-04-25 NOTE — Progress Notes (Signed)
Pre visit review using our clinic review tool, if applicable. No additional management support is needed unless otherwise documented below in the visit note. 

## 2014-04-25 NOTE — Patient Instructions (Signed)
Please complete your lab work prior to leaving. Follow up in 4 months.  

## 2014-04-26 ENCOUNTER — Encounter: Payer: Self-pay | Admitting: Family

## 2014-04-26 LAB — HEMOGLOBIN A1C
Hgb A1c MFr Bld: 6 % — ABNORMAL HIGH (ref <5.7)
Mean Plasma Glucose: 126 mg/dL — ABNORMAL HIGH (ref <117)

## 2014-05-03 ENCOUNTER — Telehealth: Payer: Self-pay

## 2014-05-03 NOTE — Telephone Encounter (Signed)
Relevant patient education assigned to patient using Emmi. ° °

## 2014-06-23 ENCOUNTER — Other Ambulatory Visit: Payer: Self-pay | Admitting: Family

## 2014-06-23 NOTE — Telephone Encounter (Signed)
Refill sent per [facility] refill protocol/SLS  

## 2014-07-25 ENCOUNTER — Other Ambulatory Visit: Payer: Self-pay | Admitting: Family

## 2014-08-24 ENCOUNTER — Other Ambulatory Visit: Payer: Self-pay | Admitting: Family

## 2014-09-02 ENCOUNTER — Ambulatory Visit (INDEPENDENT_AMBULATORY_CARE_PROVIDER_SITE_OTHER): Payer: 59 | Admitting: Family

## 2014-09-02 ENCOUNTER — Encounter: Payer: Self-pay | Admitting: Family

## 2014-09-02 VITALS — BP 122/82 | HR 74 | Temp 97.9°F | Resp 16 | Ht 63.25 in | Wt 275.8 lb

## 2014-09-02 DIAGNOSIS — R739 Hyperglycemia, unspecified: Secondary | ICD-10-CM

## 2014-09-02 DIAGNOSIS — E785 Hyperlipidemia, unspecified: Secondary | ICD-10-CM

## 2014-09-02 DIAGNOSIS — M5137 Other intervertebral disc degeneration, lumbosacral region: Secondary | ICD-10-CM

## 2014-09-02 DIAGNOSIS — R7309 Other abnormal glucose: Secondary | ICD-10-CM

## 2014-09-02 DIAGNOSIS — M5431 Sciatica, right side: Secondary | ICD-10-CM

## 2014-09-02 DIAGNOSIS — M5430 Sciatica, unspecified side: Secondary | ICD-10-CM

## 2014-09-02 DIAGNOSIS — F3289 Other specified depressive episodes: Secondary | ICD-10-CM

## 2014-09-02 DIAGNOSIS — I10 Essential (primary) hypertension: Secondary | ICD-10-CM

## 2014-09-02 DIAGNOSIS — F329 Major depressive disorder, single episode, unspecified: Secondary | ICD-10-CM

## 2014-09-02 DIAGNOSIS — M5136 Other intervertebral disc degeneration, lumbar region: Secondary | ICD-10-CM

## 2014-09-02 LAB — BASIC METABOLIC PANEL
BUN: 23 mg/dL (ref 6–23)
CHLORIDE: 101 meq/L (ref 96–112)
CO2: 28 meq/L (ref 19–32)
Calcium: 9.6 mg/dL (ref 8.4–10.5)
Creatinine, Ser: 1 mg/dL (ref 0.4–1.2)
GFR: 60.74 mL/min (ref >60.00)
Glucose, Bld: 123 mg/dL — ABNORMAL HIGH (ref 70–99)
POTASSIUM: 3.4 meq/L — AB (ref 3.5–5.1)
SODIUM: 137 meq/L (ref 135–145)

## 2014-09-02 LAB — LIPID PANEL
CHOLESTEROL: 156 mg/dL (ref 0–200)
HDL: 38.2 mg/dL — ABNORMAL LOW (ref >39.00)
LDL Cholesterol: 95 mg/dL (ref 0–99)
NonHDL: 117.8
TRIGLYCERIDES: 114 mg/dL (ref 0.0–149.0)
Total CHOL/HDL Ratio: 4
VLDL: 22.8 mg/dL (ref 0.0–40.0)

## 2014-09-02 LAB — HEMOGLOBIN A1C: Hgb A1c MFr Bld: 6.2 % (ref 4.6–6.5)

## 2014-09-02 NOTE — Assessment & Plan Note (Signed)
Stable on cymbalta. Continue same.  

## 2014-09-02 NOTE — Progress Notes (Signed)
Subjective:    Patient ID: Jordan [name], female    DOB: 06-Aug-1951, 64 y.o.   MRN: [id number]  HPI  Jordan [name] is a 63 yr old female who presents today for follow up of multiple medical problems:  1) HTN- current meds include triamterene-hctz and lasix.  Reports overall swelling is improved.  She uses lasix on Friday, Sat, Sun, Monday. These are the days that she is off from work and has more bathroom accessibility. Used the dyazide Tues Wed Thurs on the days she works.   BP Readings from Last 3 Encounters:  09/02/14 122/82  04/25/14 136/86  01/14/14 134/77    2) Hyperlipidemia-  Diet controlled. Lab Results  Component Value Date   CHOL 163 10/25/2013   HDL 42 10/25/2013   LDLCALC 82 10/25/2013   TRIG 196* 10/25/2013   CHOLHDL 3.9 10/25/2013    3) Depression- reports that depression is well controlled on cymbalta.   4) Hyperglycemia- Lab Results  Component Value Date   HGBA1C 6.0* 04/25/2014   5) Back pain- pt reports right lower back pain, radiates down into the right buttock and to the inside of right leg intermittently since June. Worsened by sitting, standing eases the pain.      Review of Systems See HPI  Past Medical History  Diagnosis Date  . Gout, unspecified   . Cellulitis and abscess of foot, except toes   . Obesity, unspecified   . Leg edema     chronic  . Cellulitis and abscess of finger, unspecified 401.9  . Depressive disorder, not elsewhere classified   . Insomnia   . Hyperlipidemia     borderline- not on meds  . GERD (gastroesophageal reflux disease)   . Osteoarthritis   . Hyperglycemia 05/08/2011  . Renal insufficiency, mild 05/08/2011    History   Social History  . Marital Status: Single    Spouse Name: N/A    Number of Children: 2  . Years of Education: N/A   Occupational History  .  [employer]   Social History Main Topics  . Smoking status: Current Every Day Smoker -- 0.25 packs/day    Types: Cigarettes  . Smokeless tobacco: Never  Used  . Alcohol Use: Yes     Comment: rare  . Drug Use: No  . Sexual Activity: Not on file   Other Topics Concern  . Not on file   Social History Narrative  . No narrative on file    Past Surgical History  Procedure Laterality Date  . Appendectomy  2002  . Cholecystectomy  1977  . Total hip arthroplasty Left 1998  . Ectopic pregnancy surgery  1971  . Abdominal hysterectomy  2002    infection- no history of cancer  . Right shoulder rotator cuff repair  06-07-10    Family History  Problem Relation Age of Onset  . Diabetes Mother   . Cancer Mother     colon/ pancreatic  . Colon cancer Mother 33  . Hypertension Father   . Alcohol abuse Father   . Cirrhosis Father   . Other Sister     Pyeoderma gangrenosis  . Obesity Daughter   . Diabetes Sister     type 2  . Heart attack Brother     No Known Allergies  Current Outpatient Prescriptions on File Prior to Visit  Medication Sig Dispense Refill  . allopurinol (ZYLOPRIM) 300 MG tablet TAKE 1/2 TABLET BY MOUTH 2 TIMES DAILY.  90 tablet  0  .  aspirin 81 MG tablet Take 81 mg by mouth daily.        . DULoxetine (CYMBALTA) 60 MG capsule TAKE 1 CAPSULE (60 MG TOTAL) BY MOUTH DAILY.  30 capsule  3  . furosemide (LASIX) 40 MG tablet TAKE 1 TABLET BY MOUTH ONCE DAILY AS NEEDED  30 tablet  3  . Multiple Vitamins-Calcium (ONE-A-DAY WOMENS FORMULA) TABS Take 1 tablet by mouth daily.        . pantoprazole (PROTONIX) 40 MG tablet TAKE 1 TABLET BY MOUTH ONCE DAILY  90 tablet  0  . triamterene-hydrochlorothiazide (DYAZIDE) 37.5-25 MG per capsule TAKE 1 CAPSULE BY MOUTH EVERY MORNING  30 capsule  3  . [DISCONTINUED] famotidine (PEPCID AC) 10 MG chewable tablet Chew 10 mg by mouth daily.         No current facility-administered medications on file prior to visit.    BP 122/82  Pulse 74  Temp(Src) 97.9 F (36.6 C) (Oral)  Resp 16  Ht 5' 3.25" (1.607 m)  Wt 275 lb 12.8 oz (125.102 kg)  BMI 48.44 kg/m2  SpO2 97%       Objective:     Physical Exam  Constitutional: She is oriented to person, place, and time.  Morbidly obese white female, NAD  HENT:  Head: Normocephalic and atraumatic.  Cardiovascular: Normal rate and regular rhythm.   No murmur heard. Pulmonary/Chest: Effort normal and breath sounds normal. No respiratory distress. She has no wheezes. She has no rales. She exhibits no tenderness.  Musculoskeletal: She exhibits no edema.  Bilateral LE strength is 5/5  Neurological: She is alert and oriented to person, place, and time.  Psychiatric: She has a normal mood and affect. Her behavior is normal. Judgment and thought content normal.          Assessment & Plan:

## 2014-09-02 NOTE — Patient Instructions (Addendum)
Please complete lab work prior to leaving. We will contact you about your referral to Physical Therapy. Work hard on healthy diet/weight loss. Follow up in 3 months.

## 2014-09-02 NOTE — Progress Notes (Signed)
Pre visit review using our clinic review tool, if applicable. No additional management support is needed unless otherwise documented below in the visit note. 

## 2014-09-02 NOTE — Assessment & Plan Note (Signed)
BP is stable on current meds, continue same.  

## 2014-09-02 NOTE — Assessment & Plan Note (Signed)
Deteriorated. We discussed medrol dose pak but she wishes to avoid steroids. She has had acute renal insufficiency in the past with NSAIDS.  She is agreeable to PT referral. If symptoms worsen or do not improve, consider MRI.

## 2014-09-02 NOTE — Assessment & Plan Note (Signed)
She will return fasting for lipid panel.

## 2014-09-02 NOTE — Assessment & Plan Note (Signed)
Will obtain follow up A1C.  D

## 2014-09-04 ENCOUNTER — Telehealth: Payer: Self-pay | Admitting: Family

## 2014-09-04 DIAGNOSIS — E876 Hypokalemia: Secondary | ICD-10-CM

## 2014-09-04 MED ORDER — POTASSIUM CHLORIDE CRYS ER 20 MEQ PO TBCR: 20.0000 meq | EXTENDED_RELEASE_TABLET | Freq: Every day | ORAL | Status: DC

## 2014-09-04 NOTE — Telephone Encounter (Signed)
Please let pt know that potassium is low. Start kdur one tab by mouth once daily. Repeat bmet in 1 week. Dx hypokalemia. Sugar remains in the borderline DM zone. Cholesterol looks good with exception of low HDL (good cholesterol). We want this to be high- exercise can help raise hdl.

## 2014-09-05 NOTE — Telephone Encounter (Signed)
Left detailed message on cell # re: below instructions and to call if any questions; entered future lab order and instructed pt to call to schedule lab appt in 1 week.

## 2014-09-13 ENCOUNTER — Other Ambulatory Visit: Payer: 59

## 2014-09-19 ENCOUNTER — Other Ambulatory Visit: Payer: Self-pay | Admitting: Family

## 2014-10-11 ENCOUNTER — Other Ambulatory Visit: Payer: Self-pay | Admitting: Family

## 2014-10-25 ENCOUNTER — Ambulatory Visit (INDEPENDENT_AMBULATORY_CARE_PROVIDER_SITE_OTHER): Payer: 59 | Admitting: Family

## 2014-10-25 ENCOUNTER — Encounter: Payer: Self-pay | Admitting: Family

## 2014-10-25 VITALS — BP 118/76 | HR 66 | Temp 97.6°F | Resp 16 | Ht 63.25 in | Wt 276.6 lb

## 2014-10-25 DIAGNOSIS — F329 Major depressive disorder, single episode, unspecified: Secondary | ICD-10-CM

## 2014-10-25 DIAGNOSIS — F32A Depression, unspecified: Secondary | ICD-10-CM

## 2014-10-25 MED ORDER — BUPROPION HCL 75 MG PO TABS: ORAL_TABLET | ORAL | Status: DC

## 2014-10-25 NOTE — Patient Instructions (Signed)
Start Wellbutrin 75mg  once daily for 3 days, then increase to a full tab twice daily. Contact behavioral health to arrange counseling. Please schedule a follow up appointment in 1 month.

## 2014-10-25 NOTE — Progress Notes (Signed)
Pre visit review using our clinic review tool, if applicable. No additional management support is needed unless otherwise documented below in the visit note.,mh

## 2014-10-25 NOTE — Progress Notes (Signed)
Subjective:    Patient ID: Jordan [name], female    DOB: Jan 01, 1951, 63 y.o.   MRN: [id number]  HPI  Jordan [name] is a 63 yr old female with hx of depression who presents today with chief complaint crying.  Has been stable on cymbalta since 2012.  Prior to that was on citalopram but this was discontinued due to weight gain.  Sister died 10-12-23. 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 Reports feeling tired, wants to sleep all the time.  She continues to work Monday through Thursday. OK if she stays busy.  Denies suicide ideation or homicide ideation  Review of Systems See HPI  Past Medical History  Diagnosis Date  . Gout, unspecified   . Cellulitis and abscess of foot, except toes   . Obesity, unspecified   . Leg edema     chronic  . Cellulitis and abscess of finger, unspecified 401.9  . Depressive disorder, not elsewhere classified   . Insomnia   . Hyperlipidemia     borderline- not on meds  . GERD (gastroesophageal reflux disease)   . Osteoarthritis   . Hyperglycemia 05/08/2011  . Renal insufficiency, mild 05/08/2011    History   Social History  . Marital Status: Single    Spouse Name: N/A    Number of Children: 2  . Years of Education: N/A   Occupational History  .  [employer]   Social History Main Topics  . Smoking status: Current Every Day Smoker -- 0.25 packs/day    Types: Cigarettes  . Smokeless tobacco: Never Used  . Alcohol Use: Yes     Comment: rare  . Drug Use: No  . Sexual Activity: Not on file   Other Topics Concern  . Not on file   Social History Narrative    Past Surgical History  Procedure Laterality Date  . Appendectomy  2002  . Cholecystectomy  1977  . Total hip arthroplasty Left 1998  . Ectopic pregnancy surgery  1971  . Abdominal hysterectomy  2002    infection- no history of cancer  . Right shoulder rotator cuff repair  06-07-10    Family History  Problem Relation Age of Onset  . Diabetes Mother   . Cancer  Mother     colon/ pancreatic  . Colon cancer Mother 31  . Hypertension Father   . Alcohol abuse Father   . Cirrhosis Father   . Other Sister     Pyeoderma gangrenosis  . Obesity Daughter   . Diabetes Sister     type 2  . Heart attack Brother     No Known Allergies  Current Outpatient Prescriptions on File Prior to Visit  Medication Sig Dispense Refill  . allopurinol (ZYLOPRIM) 300 MG tablet TAKE 1/2 TABLET BY MOUTH 2 TIMES DAILY. 90 tablet 0  . aspirin 81 MG tablet Take 81 mg by mouth daily.      . DULoxetine (CYMBALTA) 60 MG capsule TAKE 1 CAPSULE (60 MG TOTAL) BY MOUTH DAILY. 30 capsule 3  . furosemide (LASIX) 40 MG tablet TAKE 1 TABLET BY MOUTH ONCE DAILY AS NEEDED 30 tablet 5  . Multiple Vitamins-Calcium (ONE-A-DAY WOMENS FORMULA) TABS Take 1 tablet by mouth daily.      . pantoprazole (PROTONIX) 40 MG tablet TAKE 1 TABLET BY MOUTH ONCE DAILY 90 tablet 1  . potassium chloride SA (K-DUR,KLOR-CON) 20 MEQ tablet Take 1 tablet (20 mEq total) by mouth daily. 30 tablet 3  .  triamterene-hydrochlorothiazide (DYAZIDE) 37.5-25 MG per capsule TAKE 1 CAPSULE BY MOUTH EVERY MORNING 30 capsule 3  . [DISCONTINUED] famotidine (PEPCID AC) 10 MG chewable tablet Chew 10 mg by mouth daily.       No current facility-administered medications on file prior to visit.    BP 118/76 mmHg  Pulse 66  Temp(Src) 97.6 F (36.4 C) (Oral)  Resp 16  Ht 5' 3.25" (1.607 m)  Wt 276 lb 9.6 oz (125.465 kg)  BMI 48.58 kg/m2  SpO2 99%       Objective:   Physical Exam  Constitutional: She is oriented to person, place, and time. She appears well-developed and well-nourished. No distress.  Neurological: She is alert and oriented to person, place, and time.  Psychiatric: Her behavior is normal. Judgment and thought content normal.  Tearful but appropriate          Assessment & Plan:

## 2014-10-25 NOTE — Assessment & Plan Note (Signed)
Deteriorated.  Add wellbutrin (we did discuss smoking cessation and that this may help her quit). Refer to behavioral health for grief counseling. Continue cymbalta. Call if symptoms worsen, follow up in 1 month. 15 minutes spent with pt today.  >50% of this time was spent counseling pt on depression.

## 2014-11-28 ENCOUNTER — Other Ambulatory Visit: Payer: Self-pay | Admitting: Family

## 2014-12-09 ENCOUNTER — Encounter: Payer: Self-pay | Admitting: Family

## 2014-12-09 ENCOUNTER — Ambulatory Visit (INDEPENDENT_AMBULATORY_CARE_PROVIDER_SITE_OTHER): Payer: 59 | Admitting: Family

## 2014-12-09 VITALS — BP 126/63 | HR 71 | Temp 97.7°F | Wt 275.2 lb

## 2014-12-09 DIAGNOSIS — K219 Gastro-esophageal reflux disease without esophagitis: Secondary | ICD-10-CM

## 2014-12-09 DIAGNOSIS — I10 Essential (primary) hypertension: Secondary | ICD-10-CM

## 2014-12-09 DIAGNOSIS — R739 Hyperglycemia, unspecified: Secondary | ICD-10-CM

## 2014-12-09 DIAGNOSIS — F32A Depression, unspecified: Secondary | ICD-10-CM

## 2014-12-09 DIAGNOSIS — F329 Major depressive disorder, single episode, unspecified: Secondary | ICD-10-CM

## 2014-12-09 LAB — BASIC METABOLIC PANEL
BUN: 27 mg/dL — AB (ref 6–23)
CALCIUM: 9.5 mg/dL (ref 8.4–10.5)
CO2: 27 mEq/L (ref 19–32)
Chloride: 105 mEq/L (ref 96–112)
Creatinine, Ser: 1.2 mg/dL (ref 0.4–1.2)
GFR: 48.04 mL/min — ABNORMAL LOW (ref >60.00)
Glucose, Bld: 108 mg/dL — ABNORMAL HIGH (ref 70–99)
Potassium: 5.2 mEq/L — ABNORMAL HIGH (ref 3.5–5.1)
Sodium: 143 mEq/L (ref 135–145)

## 2014-12-09 LAB — HEMOGLOBIN A1C: HEMOGLOBIN A1C: 6.3 % (ref 4.6–6.5)

## 2014-12-09 MED ORDER — FUROSEMIDE 40 MG PO TABS: 40.0000 mg | ORAL_TABLET | Freq: Every day | ORAL | Status: DC | PRN

## 2014-12-09 MED ORDER — DULOXETINE HCL 60 MG PO CPEP: ORAL_CAPSULE | ORAL | Status: DC

## 2014-12-09 MED ORDER — PANTOPRAZOLE SODIUM 40 MG PO TBEC: 40.0000 mg | DELAYED_RELEASE_TABLET | Freq: Every day | ORAL | Status: DC

## 2014-12-09 MED ORDER — ALLOPURINOL 300 MG PO TABS: ORAL_TABLET | ORAL | Status: DC

## 2014-12-09 MED ORDER — BUPROPION HCL 75 MG PO TABS: ORAL_TABLET | ORAL | Status: DC

## 2014-12-09 NOTE — Assessment & Plan Note (Signed)
Improved on wellbutrin. Continue same.

## 2014-12-09 NOTE — Assessment & Plan Note (Signed)
Obtain a1c.  

## 2014-12-09 NOTE — Assessment & Plan Note (Signed)
Stable on allopurinol

## 2014-12-09 NOTE — Assessment & Plan Note (Signed)
Stable on dyazide. Continue same, obtain bmet.

## 2014-12-09 NOTE — Progress Notes (Signed)
Pre visit review using our clinic review tool, if applicable. No additional management support is needed unless otherwise documented below in the visit note. 

## 2014-12-09 NOTE — Patient Instructions (Signed)
Please complete lab work prior to leaving.  Keep working on quitting smoking. Please schedule a follow up appointment in 3 months.

## 2014-12-09 NOTE — Progress Notes (Signed)
Subjective:    Patient ID: Jordan [name], female    DOB: 01/12/1951, 63 y.o.   MRN: [id number]  HPI  Jordan [name] is a 63 yr old female who presents today for 3 month follow up.   1) Depression- last visit we added wellbutrin due to worsening depression symptoms  She was also referred to behavioral health for grief counseling. She reports that she has cut back on her smoking since she started wellbutrin. Reports that her depression is improved.  2) Hyperglycemia-   Lab Results  Component Value Date   HGBA1C 6.2 09/02/2014   3) Gout- Maintained on allopurinol. No gout flares.    4) HTN-  Maintained on dyazide. Uses lasix prn swelling.  More swelling after she works.  BP Readings from Last 3 Encounters:  12/09/14 126/63  10/25/14 118/76  09/02/14 122/82   5) GERD- maintained on protonix.   Review of Systems See HPI  Past Medical History  Diagnosis Date  . Gout, unspecified   . Cellulitis and abscess of foot, except toes   . Obesity, unspecified   . Leg edema     chronic  . Cellulitis and abscess of finger, unspecified 401.9  . Depressive disorder, not elsewhere classified   . Insomnia   . Hyperlipidemia     borderline- not on meds  . GERD (gastroesophageal reflux disease)   . Osteoarthritis   . Hyperglycemia 05/08/2011  . Renal insufficiency, mild 05/08/2011    History   Social History  . Marital Status: Single    Spouse Name: N/A    Number of Children: 2  . Years of Education: N/A   Occupational History  .  [name]   Social History Main Topics  . Smoking status: Current Every Day Smoker -- 0.25 packs/day    Types: Cigarettes  . Smokeless tobacco: Never Used  . Alcohol Use: Yes     Comment: rare  . Drug Use: No  . Sexual Activity: Not on file   Other Topics Concern  . Not on file   Social History Narrative    Past Surgical History  Procedure Laterality Date  . Appendectomy  2002  . Cholecystectomy  1977  . Total hip arthroplasty Left 1998  .  Ectopic pregnancy surgery  1971  . Abdominal hysterectomy  2002    infection- no history of cancer  . Right shoulder rotator cuff repair  06-07-10    Family History  Problem Relation Age of Onset  . Diabetes Mother   . Cancer Mother     colon/ pancreatic  . Colon cancer Mother 65  . Hypertension Father   . Alcohol abuse Father   . Cirrhosis Father   . Other Sister     Pyeoderma gangrenosis  . Obesity Daughter   . Diabetes Sister     type 2  . Heart attack Brother     No Known Allergies  Current Outpatient Prescriptions on File Prior to Visit  Medication Sig Dispense Refill  . allopurinol (ZYLOPRIM) 300 MG tablet TAKE 1/2 TABLET BY MOUTH 2 TIMES DAILY. 90 tablet 0  . aspirin 81 MG tablet Take 81 mg by mouth daily.      Marland Kitchen buPROPion (WELLBUTRIN) 75 MG tablet Take 1 tablet twice daily. 60 tablet 0  . DULoxetine (CYMBALTA) 60 MG capsule TAKE 1 CAPSULE (60 MG TOTAL) BY MOUTH DAILY. 30 capsule 3  . furosemide (LASIX) 40 MG tablet TAKE 1 TABLET BY MOUTH ONCE DAILY AS NEEDED 30 tablet 5  .  Multiple Vitamins-Calcium (ONE-A-DAY WOMENS FORMULA) TABS Take 1 tablet by mouth daily.      . pantoprazole (PROTONIX) 40 MG tablet TAKE 1 TABLET BY MOUTH ONCE DAILY 90 tablet 1  . potassium chloride SA (K-DUR,KLOR-CON) 20 MEQ tablet Take 1 tablet (20 mEq total) by mouth daily. 30 tablet 3  . triamterene-hydrochlorothiazide (DYAZIDE) 37.5-25 MG per capsule TAKE 1 CAPSULE BY MOUTH EVERY MORNING 30 capsule 3  . [DISCONTINUED] famotidine (PEPCID AC) 10 MG chewable tablet Chew 10 mg by mouth daily.       No current facility-administered medications on file prior to visit.    BP 126/63 mmHg  Pulse 71  Temp(Src) 97.7 F (36.5 C) (Oral)  Wt 275 lb 3.2 oz (124.83 kg)  SpO2 98%       Objective:   Physical Exam  Constitutional: She is oriented to person, place, and time. She appears well-developed and well-nourished. No distress.  HENT:  Head: Normocephalic and atraumatic.  Cardiovascular:  Normal rate and regular rhythm.   No murmur heard. Pulmonary/Chest: Effort normal and breath sounds normal. No respiratory distress. She has no wheezes. She has no rales. She exhibits no tenderness.  Musculoskeletal:  1+ bilateral LE edema.  Neurological: She is alert and oriented to person, place, and time.  Psychiatric: She has a normal mood and affect. Her behavior is normal. Judgment and thought content normal.          Assessment & Plan:

## 2014-12-09 NOTE — Assessment & Plan Note (Signed)
Stable on PPI. Continue same.  

## 2014-12-10 ENCOUNTER — Encounter: Payer: Self-pay | Admitting: Family

## 2014-12-10 DIAGNOSIS — E875 Hyperkalemia: Secondary | ICD-10-CM

## 2014-12-10 NOTE — Telephone Encounter (Signed)
See my chart message

## 2014-12-12 NOTE — Telephone Encounter (Signed)
Lab order placed and detailed message left on pt's cell# advising her to call and schedule lab appt in 1 week.

## 2014-12-19 ENCOUNTER — Other Ambulatory Visit: Payer: Self-pay | Admitting: Family

## 2014-12-19 NOTE — Telephone Encounter (Signed)
Last filled: 07/25/14 Amt: 30, 3 refills Last OV: 12/09/14  Med filled.

## 2015-03-03 ENCOUNTER — Ambulatory Visit (INDEPENDENT_AMBULATORY_CARE_PROVIDER_SITE_OTHER): Payer: 59 | Admitting: Family

## 2015-03-03 ENCOUNTER — Encounter: Payer: Self-pay | Admitting: Family

## 2015-03-03 VITALS — BP 134/82 | HR 82 | Temp 98.0°F | Resp 16 | Ht 63.25 in

## 2015-03-03 DIAGNOSIS — M778 Other enthesopathies, not elsewhere classified: Secondary | ICD-10-CM

## 2015-03-03 NOTE — Assessment & Plan Note (Signed)
+   tendonitis of the left wrist.  Advised that she d/c NSAIDS due to hx of renal insufficiency. Instead, apply wrist splint.  Tylenol/ice prn. Plan referral to sports medicine if symptoms fail to improve.

## 2015-03-03 NOTE — Patient Instructions (Signed)
Wear wrist brace as tolerated. Stop ibuprofen, you may use tylenol as needed. Call if wrist pain worsens or if if it does not improve.  Follow up as scheduled.

## 2015-03-03 NOTE — Progress Notes (Signed)
Subjective:    Patient ID: Jordan [name], female    DOB: 05/25/1951, 64 y.o.   MRN: [id number]  HPI  Left wrist pain- burning/pulling sensation with movement, none at rest. Was terrible on Monday, improving. Using ibuprofen 800mg  intermittently. Last dose yesterday at 10 AM.    Review of Systems See HPI  Past Medical History  Diagnosis Date  . Gout, unspecified   . Cellulitis and abscess of foot, except toes   . Obesity, unspecified   . Leg edema     chronic  . Cellulitis and abscess of finger, unspecified 401.9  . Depressive disorder, not elsewhere classified   . Insomnia   . Hyperlipidemia     borderline- not on meds  . GERD (gastroesophageal reflux disease)   . Osteoarthritis   . Hyperglycemia 05/08/2011  . Renal insufficiency, mild 05/08/2011    History   Social History  . Marital Status: Single    Spouse Name: N/A  . Number of Children: 2  . Years of Education: N/A   Occupational History  .  [employer]   Social History Main Topics  . Smoking status: Current Every Day Smoker -- 0.25 packs/day    Types: Cigarettes  . Smokeless tobacco: Never Used  . Alcohol Use: Yes     Comment: rare  . Drug Use: No  . Sexual Activity: Not on file   Other Topics Concern  . Not on file   Social History Narrative    Past Surgical History  Procedure Laterality Date  . Appendectomy  2002  . Cholecystectomy  1977  . Total hip arthroplasty Left 1998  . Ectopic pregnancy surgery  1971  . Abdominal hysterectomy  2002    infection- no history of cancer  . Right shoulder rotator cuff repair  06-07-10    Family History  Problem Relation Age of Onset  . Diabetes Mother   . Cancer Mother     colon/ pancreatic  . Colon cancer Mother 13  . Hypertension Father   . Alcohol abuse Father   . Cirrhosis Father   . Other Sister     Pyeoderma gangrenosis  . Obesity Daughter   . Diabetes Sister     type 2  . Heart attack Brother     No Known Allergies  Current  Outpatient Prescriptions on File Prior to Visit  Medication Sig Dispense Refill  . allopurinol (ZYLOPRIM) 300 MG tablet TAKE 1/2 TABLET BY MOUTH 2 TIMES DAILY. 90 tablet 1  . aspirin 81 MG tablet Take 81 mg by mouth daily.      Marland Kitchen buPROPion (WELLBUTRIN) 75 MG tablet Take 1 tablet twice daily. 180 tablet 1  . DULoxetine (CYMBALTA) 60 MG capsule TAKE 1 CAPSULE (60 MG TOTAL) BY MOUTH DAILY. 90 capsule 1  . furosemide (LASIX) 40 MG tablet Take 1 tablet (40 mg total) by mouth daily as needed. 30 tablet 5  . Multiple Vitamins-Calcium (ONE-A-DAY WOMENS FORMULA) TABS Take 1 tablet by mouth daily.      . pantoprazole (PROTONIX) 40 MG tablet Take 1 tablet (40 mg total) by mouth daily. 90 tablet 1  . potassium chloride SA (K-DUR,KLOR-CON) 20 MEQ tablet Take 1 tablet (20 mEq total) by mouth daily. 30 tablet 3  . triamterene-hydrochlorothiazide (DYAZIDE) 37.5-25 MG per capsule TAKE 1 CAPSULE BY MOUTH EVERY MORNING 30 capsule 3  . [DISCONTINUED] famotidine (PEPCID AC) 10 MG chewable tablet Chew 10 mg by mouth daily.       No current facility-administered  medications on file prior to visit.    BP 134/82 mmHg  Pulse 82  Temp(Src) 98 F (36.7 C) (Oral)  Resp 16  Ht 5' 3.25" (1.607 m)  SpO2 97%       Objective:   Physical Exam  Constitutional: She appears well-developed and well-nourished.  Cardiovascular: Normal rate, regular rhythm and normal heart sounds.   No murmur heard. Pulmonary/Chest: Effort normal and breath sounds normal. No respiratory distress. She has no wheezes.  Musculoskeletal:  Pea sized bony nodule noted left medial wrist.  + tenderness to palpation medially, neg tinnels.  Some pain with flexion/extension  Psychiatric: She has a normal mood and affect. Her behavior is normal. Judgment and thought content normal.          Assessment & Plan:

## 2015-03-03 NOTE — Progress Notes (Signed)
Pre visit review using our clinic review tool, if applicable. No additional management support is needed unless otherwise documented below in the visit note. 

## 2015-03-15 ENCOUNTER — Encounter: Payer: Self-pay | Admitting: Family

## 2015-03-15 ENCOUNTER — Ambulatory Visit (INDEPENDENT_AMBULATORY_CARE_PROVIDER_SITE_OTHER): Payer: 59 | Admitting: Family

## 2015-03-15 VITALS — BP 132/74 | HR 75 | Temp 98.0°F | Resp 18 | Ht 63.25 in | Wt 281.0 lb

## 2015-03-15 DIAGNOSIS — M109 Gout, unspecified: Secondary | ICD-10-CM

## 2015-03-15 DIAGNOSIS — E669 Obesity, unspecified: Secondary | ICD-10-CM

## 2015-03-15 DIAGNOSIS — F329 Major depressive disorder, single episode, unspecified: Secondary | ICD-10-CM

## 2015-03-15 DIAGNOSIS — M778 Other enthesopathies, not elsewhere classified: Secondary | ICD-10-CM | POA: Diagnosis not present

## 2015-03-15 DIAGNOSIS — R739 Hyperglycemia, unspecified: Secondary | ICD-10-CM

## 2015-03-15 DIAGNOSIS — F32A Depression, unspecified: Secondary | ICD-10-CM

## 2015-03-15 DIAGNOSIS — M25532 Pain in left wrist: Secondary | ICD-10-CM | POA: Diagnosis not present

## 2015-03-15 LAB — BASIC METABOLIC PANEL
BUN: 20 mg/dL (ref 6–23)
CO2: 27 mEq/L (ref 19–32)
Calcium: 9.5 mg/dL (ref 8.4–10.5)
Chloride: 102 mEq/L (ref 96–112)
Creatinine, Ser: 1.19 mg/dL (ref 0.40–1.20)
GFR: 48.46 mL/min — AB (ref >60.00)
GLUCOSE: 131 mg/dL — AB (ref 70–99)
Potassium: 3.8 mEq/L (ref 3.5–5.1)
Sodium: 137 mEq/L (ref 135–145)

## 2015-03-15 LAB — HEMOGLOBIN A1C: Hgb A1c MFr Bld: 6.4 % (ref 4.6–6.5)

## 2015-03-15 NOTE — Patient Instructions (Signed)
You will be contacted about your referral to Dr. Barbaraann Barthel. Please complete lab work prior to leaving. Please schedule a follow up appointment in 3 months.

## 2015-03-15 NOTE — Assessment & Plan Note (Signed)
Obtain a1c.  

## 2015-03-15 NOTE — Assessment & Plan Note (Signed)
Improving, continue current meds.

## 2015-03-15 NOTE — Assessment & Plan Note (Signed)
Stable on allopurinol, continue same.  

## 2015-03-15 NOTE — Assessment & Plan Note (Signed)
Encouraged her to continue diet/exercise/weight loss efforts.

## 2015-03-15 NOTE — Progress Notes (Signed)
Subjective:    Patient ID: Jordan [name], female    DOB: [birth year], 64 y.o.   MRN: [id number]  HPI  Jordan [name] is a 64 yr old female who presents today for follow up.  Patient is currently maintained on the following medications for blood pressure: dyazide.   Patient reports good compliance with blood pressure medications. Patient denies chest pain, shortness of breath. Does have some dependent edema- uses lasix on the days she is off from work to help with LE edema.  Last 3 blood pressure readings in our office are as follows: BP Readings from Last 3 Encounters:  03/15/15 132/74  03/03/15 134/82  12/09/14 126/63   Depression- on wellbutrin, cymbalta. Slowly improving, has 1-2 "bad days" a month when she wants to stay in bed. Overall improved.   Hyperglycemia-  Lab Results  Component Value Date   HGBA1C 6.3 12/09/2014   She has started going to the [facility] 4 x a week.  Having trouble losing weight.  Gout- stable on allopurinol, no flares.  GERD- reports well controlled with protonix.  L wrist pain- not improved, Has been wearing her brace.     Review of Systems    see HPI  Past Medical History  Diagnosis Date  . Gout, unspecified   . Cellulitis and abscess of foot, except toes   . Obesity, unspecified   . Leg edema     chronic  . Cellulitis and abscess of finger, unspecified 401.9  . Depressive disorder, not elsewhere classified   . Insomnia   . Hyperlipidemia     borderline- not on meds  . GERD (gastroesophageal reflux disease)   . Osteoarthritis   . Hyperglycemia 05/08/2011  . Renal insufficiency, mild 05/08/2011    History   Social History  . Marital Status: Single    Spouse Name: N/A  . Number of Children: 2  . Years of Education: N/A   Occupational History  .  [employer]   Social History Main Topics  . Smoking status: Current Every Day Smoker -- 0.25 packs/day    Types: Cigarettes  . Smokeless tobacco: Never Used  . Alcohol Use: Yes   Comment: rare  . Drug Use: No  . Sexual Activity: Not on file   Other Topics Concern  . Not on file   Social History Narrative    Past Surgical History  Procedure Laterality Date  . Appendectomy  2002  . Cholecystectomy  1977  . Total hip arthroplasty Left 1998  . Ectopic pregnancy surgery  1971  . Abdominal hysterectomy  2002    infection- no history of cancer  . Right shoulder rotator cuff repair  06-07-10    Family History  Problem Relation Age of Onset  . Diabetes Mother   . Cancer Mother     colon/ pancreatic  . Colon cancer Mother 68  . Hypertension Father   . Alcohol abuse Father   . Cirrhosis Father   . Other Sister     Pyeoderma gangrenosis  . Obesity Daughter   . Diabetes Sister     type 2  . Heart attack Brother     No Known Allergies  Current Outpatient Prescriptions on File Prior to Visit  Medication Sig Dispense Refill  . allopurinol (ZYLOPRIM) 300 MG tablet TAKE 1/2 TABLET BY MOUTH 2 TIMES DAILY. 90 tablet 1  . aspirin 81 MG tablet Take 81 mg by mouth daily.      Marland Kitchen buPROPion (WELLBUTRIN) 75 MG tablet Take  1 tablet twice daily. 180 tablet 1  . DULoxetine (CYMBALTA) 60 MG capsule TAKE 1 CAPSULE (60 MG TOTAL) BY MOUTH DAILY. 90 capsule 1  . furosemide (LASIX) 40 MG tablet Take 1 tablet (40 mg total) by mouth daily as needed. 30 tablet 5  . Multiple Vitamins-Calcium (ONE-A-DAY WOMENS FORMULA) TABS Take 1 tablet by mouth daily.      . pantoprazole (PROTONIX) 40 MG tablet Take 1 tablet (40 mg total) by mouth daily. 90 tablet 1  . triamterene-hydrochlorothiazide (DYAZIDE) 37.5-25 MG per capsule TAKE 1 CAPSULE BY MOUTH EVERY MORNING 30 capsule 3  . [DISCONTINUED] famotidine (PEPCID AC) 10 MG chewable tablet Chew 10 mg by mouth daily.       No current facility-administered medications on file prior to visit.    BP 132/74 mmHg  Pulse 75  Temp(Src) 98 F (36.7 C) (Oral)  Resp 18  Ht 5' 3.25" (1.607 m)  Wt 281 lb (127.461 kg)  BMI 49.36 kg/m2  SpO2  97%    Objective:   Physical Exam  Constitutional: She is oriented to person, place, and time. She appears well-developed and well-nourished.  Cardiovascular: Normal rate, regular rhythm and normal heart sounds.   No murmur heard. Pulmonary/Chest: Effort normal and breath sounds normal. No respiratory distress. She has no wheezes.  Musculoskeletal:  Bony nodule left medial wrist.  Above left medial wrist is mildly tender/swollen.   Neurological: She is alert and oriented to person, place, and time.  Psychiatric: She has a normal mood and affect. Her behavior is normal. Judgment and thought content normal.          Assessment & Plan:

## 2015-03-15 NOTE — Progress Notes (Signed)
Pre visit review using our clinic review tool, if applicable. No additional management support is needed unless otherwise documented below in the visit note. 

## 2015-03-15 NOTE — Assessment & Plan Note (Signed)
Unchanged, refer to sports med for further evaluation

## 2015-03-16 ENCOUNTER — Ambulatory Visit (INDEPENDENT_AMBULATORY_CARE_PROVIDER_SITE_OTHER): Payer: 59 | Admitting: Family Medicine

## 2015-03-16 ENCOUNTER — Encounter: Payer: Self-pay | Admitting: Family

## 2015-03-16 ENCOUNTER — Encounter: Payer: Self-pay | Admitting: Family Medicine

## 2015-03-16 VITALS — BP 124/82 | HR 80 | Ht 64.0 in | Wt 275.0 lb

## 2015-03-16 DIAGNOSIS — M654 Radial styloid tenosynovitis [de Quervain]: Secondary | ICD-10-CM

## 2015-03-16 MED ORDER — MELOXICAM 15 MG PO TABS: 15.0000 mg | ORAL_TABLET | Freq: Every day | ORAL | Status: DC

## 2015-03-16 NOTE — Patient Instructions (Signed)
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 Avoid painful activities as much as possible. Wear the thumb spica brace as often as possible to rest this. Meloxicam 15mg  daily with food for pain and inflammation - take regularly. Ice 15 minutes at a time 3-4 times a day A cortisone injection typically helps a great deal with this and is an option. Follow up with me in 6 weeks though you can see me sooner if you want a cortisone shot.

## 2015-03-21 DIAGNOSIS — M654 Radial styloid tenosynovitis [de Quervain]: Secondary | ICD-10-CM | POA: Insufficient documentation

## 2015-03-21 NOTE — Assessment & Plan Note (Signed)
Switch to thumb spica with meloxicam as needed.  Icing as needed.  She will consider a first dorsal compartment cortisone injection.  Otherwise f/u in 6 weeks.

## 2015-03-21 NOTE — Progress Notes (Signed)
PCP and referred by: Nance Pear., NP  Subjective:   HPI: Patient is a 64 y.o. female here for left wrist pain.  Patient reports she's had left wrist pain for about 6 weeks. No known injury or trauma. 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 Some swelling. Wearing a wrist brace which has helped some though not a thumb spica. Right handed.  Past Medical History  Diagnosis Date  . Gout, unspecified   . Cellulitis and abscess of foot, except toes   . Obesity, unspecified   . Leg edema     chronic  . Cellulitis and abscess of finger, unspecified 401.9  . Depressive disorder, not elsewhere classified   . Insomnia   . Hyperlipidemia     borderline- not on meds  . GERD (gastroesophageal reflux disease)   . Osteoarthritis   . Hyperglycemia 05/08/2011  . Renal insufficiency, mild 05/08/2011    Current Outpatient Prescriptions on File Prior to Visit  Medication Sig Dispense Refill  . allopurinol (ZYLOPRIM) 300 MG tablet TAKE 1/2 TABLET BY MOUTH 2 TIMES DAILY. 90 tablet 1  . aspirin 81 MG tablet Take 81 mg by mouth daily.      Marland Kitchen buPROPion (WELLBUTRIN) 75 MG tablet Take 1 tablet twice daily. 180 tablet 1  . DULoxetine (CYMBALTA) 60 MG capsule TAKE 1 CAPSULE (60 MG TOTAL) BY MOUTH DAILY. 90 capsule 1  . furosemide (LASIX) 40 MG tablet Take 1 tablet (40 mg total) by mouth daily as needed. 30 tablet 5  . Multiple Vitamins-Calcium (ONE-A-DAY WOMENS FORMULA) TABS Take 1 tablet by mouth daily.      . pantoprazole (PROTONIX) 40 MG tablet Take 1 tablet (40 mg total) by mouth daily. 90 tablet 1  . triamterene-hydrochlorothiazide (DYAZIDE) 37.5-25 MG per capsule TAKE 1 CAPSULE BY MOUTH EVERY MORNING 30 capsule 3  . [DISCONTINUED] famotidine (PEPCID AC) 10 MG chewable tablet Chew 10 mg by mouth daily.       No current facility-administered medications on file prior to visit.    Past Surgical History  Procedure Laterality  Date  . Appendectomy  2002  . Cholecystectomy  1977  . Total hip arthroplasty Left 1998  . Ectopic pregnancy surgery  1971  . Abdominal hysterectomy  2002    infection- no history of cancer  . Right shoulder rotator cuff repair  06-07-10    No Known Allergies  History   Social History  . Marital Status: Single    Spouse Name: N/A  . Number of Children: 2  . Years of Education: N/A   Occupational History  .  [employer]   Social History Main Topics  . Smoking status: Current Every Day Smoker -- 0.25 packs/day    Types: Cigarettes  . Smokeless tobacco: Never Used  . Alcohol Use: 0.0 oz/week    0 Standard drinks or equivalent per week     Comment: rare  . Drug Use: No  . Sexual Activity: Not on file   Other Topics Concern  . Not on file   Social History Narrative    Family History  Problem Relation Age of Onset  . Diabetes Mother   . Cancer Mother     colon/ pancreatic  . Colon cancer Mother 39  . Hypertension Father   . Alcohol abuse Father   . Cirrhosis Father   . Other Sister     Pyeoderma gangrenosis  . Obesity Daughter   . Diabetes  Sister     type 2  . Heart attack Brother     BP 124/82 mmHg  Pulse 80  Ht 5\' 4"  (1.626 m)  Wt 275 lb (124.739 kg)  BMI 47.18 kg/m2  Review of Systems: See HPI above.    Objective:  Physical Exam:  Gen: NAD  Left wrist: No gross deformity.  Mild swelling 1st dorsal compartment.  No bruising. TTP 1st dorsal compartment.  No base 1st CMC, carpal tunnel, other tenderness. FROM with pain on all thumb motions. Sensation intact to light touch. Negative tinels. Positive finkelsteins. NVI distally.  Assessment & Plan:  1. dequervains tenosynovitis - Switch to thumb spica with meloxicam as needed.  Icing as needed.  She will consider a first dorsal compartment cortisone injection.  Otherwise f/u in 6 weeks.

## 2015-04-27 ENCOUNTER — Encounter: Payer: Self-pay | Admitting: Family Medicine

## 2015-04-27 ENCOUNTER — Ambulatory Visit (INDEPENDENT_AMBULATORY_CARE_PROVIDER_SITE_OTHER): Payer: 59 | Admitting: Family Medicine

## 2015-04-27 VITALS — BP 146/89 | HR 68 | Ht 66.0 in | Wt 270.0 lb

## 2015-04-27 DIAGNOSIS — M25532 Pain in left wrist: Secondary | ICD-10-CM

## 2015-04-27 DIAGNOSIS — M654 Radial styloid tenosynovitis [de Quervain]: Secondary | ICD-10-CM | POA: Diagnosis not present

## 2015-04-27 MED ORDER — METHYLPREDNISOLONE ACETATE 40 MG/ML IJ SUSP
40.0000 mg | Freq: Once | INTRAMUSCULAR | Status: AC
  Administered 2015-04-27: 20 mg via INTRA_ARTICULAR

## 2015-04-27 NOTE — Patient Instructions (Signed)
You were given a cortisone shot today. Wear the brace for 1-2 more weeks until the pain is gone from your thumb. Call us if you want to try occupational therapy for the extensor tendinitis you have. Icing, ibuprofen or aleve should help. Follow up with me in 6 weeks.

## 2015-05-01 NOTE — Assessment & Plan Note (Signed)
Injection given today.  Continue thumb spica brace for at least 1-2 more weeks.  Consider occupational therapy for her extensor tendinitis.  Icing, nsaids.  F/u in 6 weeks.  After informed written consent patient was seated in chair in exam room.  Area overlying left 1st dorsal compartment of wrist prepped with alcohol swab then injected with 0.5:0.38mL marcaine: depomedrol.  Patient tolerated procedure well without immediate complications.

## 2015-05-01 NOTE — Progress Notes (Signed)
PCP and referred by: Nance Pear., NP  Subjective:   HPI: Patient is a 64 y.o. female here for left wrist pain.  3/24: Patient reports she's had left wrist pain for about 6 weeks. No known injury or trauma. 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 Some swelling. Wearing a wrist brace which has helped some though not a thumb spica. Right handed.  5/5: Patient reports pain feels about the same as last visit. Getting some pain on dorsal aspect of hand now too. Wearing brace regularly. Pain level 2/10 currently.  Past Medical History  Diagnosis Date  . Gout, unspecified   . Cellulitis and abscess of foot, except toes   . Obesity, unspecified   . Leg edema     chronic  . Cellulitis and abscess of finger, unspecified 401.9  . Depressive disorder, not elsewhere classified   . Insomnia   . Hyperlipidemia     borderline- not on meds  . GERD (gastroesophageal reflux disease)   . Osteoarthritis   . Hyperglycemia 05/08/2011  . Renal insufficiency, mild 05/08/2011    Current Outpatient Prescriptions on File Prior to Visit  Medication Sig Dispense Refill  . allopurinol (ZYLOPRIM) 300 MG tablet TAKE 1/2 TABLET BY MOUTH 2 TIMES DAILY. 90 tablet 1  . aspirin 81 MG tablet Take 81 mg by mouth daily.      Marland Kitchen buPROPion (WELLBUTRIN) 75 MG tablet Take 1 tablet twice daily. 180 tablet 1  . DULoxetine (CYMBALTA) 60 MG capsule TAKE 1 CAPSULE (60 MG TOTAL) BY MOUTH DAILY. 90 capsule 1  . furosemide (LASIX) 40 MG tablet Take 1 tablet (40 mg total) by mouth daily as needed. 30 tablet 5  . meloxicam (MOBIC) 15 MG tablet Take 1 tablet (15 mg total) by mouth daily. 30 tablet 2  . Multiple Vitamins-Calcium (ONE-A-DAY WOMENS FORMULA) TABS Take 1 tablet by mouth daily.      . pantoprazole (PROTONIX) 40 MG tablet Take 1 tablet (40 mg total) by mouth daily. 90 tablet 1  . triamterene-hydrochlorothiazide (DYAZIDE) 37.5-25 MG per capsule  TAKE 1 CAPSULE BY MOUTH EVERY MORNING 30 capsule 3  . [DISCONTINUED] famotidine (PEPCID AC) 10 MG chewable tablet Chew 10 mg by mouth daily.       No current facility-administered medications on file prior to visit.    Past Surgical History  Procedure Laterality Date  . Appendectomy  2002  . Cholecystectomy  1977  . Total hip arthroplasty Left 1998  . Ectopic pregnancy surgery  1971  . Abdominal hysterectomy  2002    infection- no history of cancer  . Right shoulder rotator cuff repair  06-07-10    No Known Allergies  History   Social History  . Marital Status: Single    Spouse Name: N/A  . Number of Children: 2  . Years of Education: N/A   Occupational History  .  [employer]   Social History Main Topics  . Smoking status: Current Every Day Smoker -- 0.25 packs/day    Types: Cigarettes  . Smokeless tobacco: Never Used  . Alcohol Use: 0.0 oz/week    0 Standard drinks or equivalent per week     Comment: rare  . Drug Use: No  . Sexual Activity: Not on file   Other Topics Concern  . Not on file   Social History Narrative    Family History  Problem Relation Age of Onset  . Diabetes Mother   .  Cancer Mother     colon/ pancreatic  . Colon cancer Mother 30  . Hypertension Father   . Alcohol abuse Father   . Cirrhosis Father   . Other Sister     Pyeoderma gangrenosis  . Obesity Daughter   . Diabetes Sister     type 2  . Heart attack Brother     BP 146/89 mmHg  Pulse 68  Ht 5\' 6"  (1.676 m)  Wt 270 lb (122.471 kg)  BMI 43.60 kg/m2  Review of Systems: See HPI above.    Objective:  Physical Exam:  Gen: NAD  Left wrist: No gross deformity.  Mild swelling 1st dorsal compartment.  No bruising. TTP 1st dorsal compartment.  Mild tenderness over extensor digitorum tendons of hand.  No base 1st CMC, carpal tunnel, other tenderness. FROM with pain on all thumb motions. Sensation intact to light touch. Negative tinels. Positive finkelsteins. NVI  distally.  Assessment & Plan:  1. Dequervains tenosynovitis - Injection given today.  Continue thumb spica brace for at least 1-2 more weeks.  Consider occupational therapy for her extensor tendinitis.  Icing, nsaids.  F/u in 6 weeks.  After informed written consent patient was seated in chair in exam room.  Area overlying left 1st dorsal compartment of wrist prepped with alcohol swab then injected with 0.5:0.4mL marcaine: depomedrol.  Patient tolerated procedure well without immediate complications.

## 2015-05-02 ENCOUNTER — Other Ambulatory Visit: Payer: Self-pay | Admitting: Family

## 2015-06-14 ENCOUNTER — Ambulatory Visit (INDEPENDENT_AMBULATORY_CARE_PROVIDER_SITE_OTHER): Payer: 59 | Admitting: Family

## 2015-06-14 ENCOUNTER — Encounter: Payer: Self-pay | Admitting: Family

## 2015-06-14 VITALS — BP 138/78 | HR 94 | Temp 98.3°F | Ht 63.0 in | Wt 278.6 lb

## 2015-06-14 DIAGNOSIS — I10 Essential (primary) hypertension: Secondary | ICD-10-CM | POA: Diagnosis not present

## 2015-06-14 DIAGNOSIS — M109 Gout, unspecified: Secondary | ICD-10-CM

## 2015-06-14 DIAGNOSIS — F329 Major depressive disorder, single episode, unspecified: Secondary | ICD-10-CM | POA: Diagnosis not present

## 2015-06-14 DIAGNOSIS — K219 Gastro-esophageal reflux disease without esophagitis: Secondary | ICD-10-CM

## 2015-06-14 DIAGNOSIS — F32A Depression, unspecified: Secondary | ICD-10-CM

## 2015-06-14 DIAGNOSIS — R739 Hyperglycemia, unspecified: Secondary | ICD-10-CM

## 2015-06-14 MED ORDER — BUPROPION HCL ER (SR) 150 MG PO TB12: 150.0000 mg | ORAL_TABLET | Freq: Two times a day (BID) | ORAL | Status: DC

## 2015-06-14 NOTE — Assessment & Plan Note (Signed)
Uncontrolled. Increase wellbutrin to 150mg  bid.  Hopefully this will also help curb smoking cravings.

## 2015-06-14 NOTE — Assessment & Plan Note (Signed)
No flares on allopurinol.  Continue same.

## 2015-06-14 NOTE — Assessment & Plan Note (Signed)
Stable on PPI, continue same.  

## 2015-06-14 NOTE — Assessment & Plan Note (Signed)
Last a1c stable. Plan repeat a1c next visit.

## 2015-06-14 NOTE — Assessment & Plan Note (Signed)
BP stable. Continue current meds.   

## 2015-06-14 NOTE — Patient Instructions (Signed)
-

## 2015-06-14 NOTE — Progress Notes (Signed)
Subjective:    Patient ID: Jordan [name], female    DOB: 06/26/1951, 64 y.o.   MRN: [id number]  HPI  Jordan [name] is a 64 yr old female who presents today for follow up.  Depression- maintained on wellbutrin and cymbalta. Reports that she has had fewer days when she doesn't want to get out of bed. Reports about 1-2 days/2 weeks she has a day when she is just "blah."  She quit smoking but notes that most recently her cravings have worsened.   GERD- maintained on PPI.  Reports gerd symptoms well controlled.  Gout- maintained on allopurinol. Reports no recent gout flares.    Hyperglycemia-  Trying to watch her diet.   Wt Readings from Last 3 Encounters:  06/14/15 278 lb 9.6 oz (126.372 kg)  04/27/15 270 lb (122.471 kg)  03/16/15 275 lb (124.739 kg)    Lab Results  Component Value Date   HGBA1C 6.4 03/15/2015   HTN-  Patient is currently maintained on the following medications for blood pressure: lasix, dyazide Patient reports good compliance with blood pressure medications. Patient denies chest pain, shortness of breath. Swelling has been well controlled.  Last 3 blood pressure readings in our office are as follows:   BP Readings from Last 3 Encounters:  06/14/15 138/78  04/27/15 146/89  03/16/15 124/82    Review of Systems    see HPI  Past Medical History  Diagnosis Date  . Gout, unspecified   . Cellulitis and abscess of foot, except toes   . Obesity, unspecified   . Leg edema     chronic  . Cellulitis and abscess of finger, unspecified 401.9  . Depressive disorder, not elsewhere classified   . Insomnia   . Hyperlipidemia     borderline- not on meds  . GERD (gastroesophageal reflux disease)   . Osteoarthritis   . Hyperglycemia 05/08/2011  . Renal insufficiency, mild 05/08/2011    History   Social History  . Marital Status: Single    Spouse Name: N/A  . Number of Children: 2  . Years of Education: N/A   Occupational History  .  [employer]   Social  History Main Topics  . Smoking status: Current Every Day Smoker -- 0.25 packs/day    Types: Cigarettes  . Smokeless tobacco: Never Used  . Alcohol Use: 0.0 oz/week    0 Standard drinks or equivalent per week     Comment: rare  . Drug Use: No  . Sexual Activity: Not on file   Other Topics Concern  . Not on file   Social History Narrative    Past Surgical History  Procedure Laterality Date  . Appendectomy  2002  . Cholecystectomy  1977  . Total hip arthroplasty Left 1998  . Ectopic pregnancy surgery  1971  . Abdominal hysterectomy  2002    infection- no history of cancer  . Right shoulder rotator cuff repair  06-07-10    Family History  Problem Relation Age of Onset  . Diabetes Mother   . Cancer Mother     colon/ pancreatic  . Colon cancer Mother 41  . Hypertension Father   . Alcohol abuse Father   . Cirrhosis Father   . Other Sister     Pyeoderma gangrenosis  . Obesity Daughter   . Diabetes Sister     type 2  . Heart attack Brother     No Known Allergies  Current Outpatient Prescriptions on File Prior to Visit  Medication  Sig Dispense Refill  . allopurinol (ZYLOPRIM) 300 MG tablet TAKE 1/2 TABLET BY MOUTH 2 TIMES DAILY. 90 tablet 1  . aspirin 81 MG tablet Take 81 mg by mouth daily.      Marland Kitchen buPROPion (WELLBUTRIN) 75 MG tablet Take 1 tablet twice daily. 180 tablet 1  . DULoxetine (CYMBALTA) 60 MG capsule TAKE 1 CAPSULE (60 MG TOTAL) BY MOUTH DAILY. 90 capsule 1  . furosemide (LASIX) 40 MG tablet Take 1 tablet (40 mg total) by mouth daily as needed. 30 tablet 5  . meloxicam (MOBIC) 15 MG tablet Take 1 tablet (15 mg total) by mouth daily. 30 tablet 2  . Multiple Vitamins-Calcium (ONE-A-DAY WOMENS FORMULA) TABS Take 1 tablet by mouth daily.      . pantoprazole (PROTONIX) 40 MG tablet Take 1 tablet (40 mg total) by mouth daily. 90 tablet 1  . triamterene-hydrochlorothiazide (DYAZIDE) 37.5-25 MG per capsule TAKE 1 CAPSULE BY MOUTH EVERY MORNING 30 capsule 0  .  [DISCONTINUED] famotidine (PEPCID AC) 10 MG chewable tablet Chew 10 mg by mouth daily.       No current facility-administered medications on file prior to visit.    BP 138/78 mmHg  Pulse 94  Temp(Src) 98.3 F (36.8 C) (Oral)  Ht 5\' 3"  (1.6 m)  Wt 278 lb 9.6 oz (126.372 kg)  BMI 49.36 kg/m2  SpO2 94%     Objective:   Physical Exam  Constitutional: She appears well-developed and well-nourished.  HENT:  Head: Normocephalic and atraumatic.  Eyes: No scleral icterus.  Cardiovascular: Normal rate, regular rhythm and normal heart sounds.   No murmur heard. Pulmonary/Chest: Effort normal and breath sounds normal. No respiratory distress. She has no wheezes.  Skin: Skin is warm and dry.  Psychiatric: She has a normal mood and affect. Her behavior is normal. Judgment and thought content normal.          Assessment & Plan:

## 2015-06-27 ENCOUNTER — Other Ambulatory Visit: Payer: Self-pay | Admitting: Family

## 2015-07-19 ENCOUNTER — Other Ambulatory Visit: Payer: Self-pay | Admitting: Family

## 2015-07-25 ENCOUNTER — Encounter: Payer: Self-pay | Admitting: Family

## 2015-07-25 ENCOUNTER — Ambulatory Visit (INDEPENDENT_AMBULATORY_CARE_PROVIDER_SITE_OTHER): Payer: 59 | Admitting: Family

## 2015-07-25 VITALS — BP 140/84 | HR 81 | Temp 97.8°F | Resp 16 | Ht 63.0 in | Wt 276.8 lb

## 2015-07-25 DIAGNOSIS — F32A Depression, unspecified: Secondary | ICD-10-CM

## 2015-07-25 DIAGNOSIS — F329 Major depressive disorder, single episode, unspecified: Secondary | ICD-10-CM

## 2015-07-25 DIAGNOSIS — H8110 Benign paroxysmal vertigo, unspecified ear: Secondary | ICD-10-CM | POA: Diagnosis not present

## 2015-07-25 DIAGNOSIS — R739 Hyperglycemia, unspecified: Secondary | ICD-10-CM | POA: Diagnosis not present

## 2015-07-25 LAB — BASIC METABOLIC PANEL
BUN: 25 mg/dL — ABNORMAL HIGH (ref 6–23)
CO2: 28 mEq/L (ref 19–32)
CREATININE: 1.14 mg/dL (ref 0.40–1.20)
Calcium: 9.3 mg/dL (ref 8.4–10.5)
Chloride: 101 mEq/L (ref 96–112)
GFR: 50.87 mL/min — AB (ref >60.00)
Glucose, Bld: 108 mg/dL — ABNORMAL HIGH (ref 70–99)
Potassium: 3.6 mEq/L (ref 3.5–5.1)
Sodium: 138 mEq/L (ref 135–145)

## 2015-07-25 LAB — HEMOGLOBIN A1C: HEMOGLOBIN A1C: 6 % (ref 4.6–6.5)

## 2015-07-25 MED ORDER — FUROSEMIDE 40 MG PO TABS: 40.0000 mg | ORAL_TABLET | Freq: Every day | ORAL | Status: DC | PRN

## 2015-07-25 MED ORDER — MECLIZINE HCL 25 MG PO TABS: 25.0000 mg | ORAL_TABLET | Freq: Two times a day (BID) | ORAL | Status: DC | PRN

## 2015-07-25 MED ORDER — PANTOPRAZOLE SODIUM 40 MG PO TBEC: 40.0000 mg | DELAYED_RELEASE_TABLET | Freq: Every day | ORAL | Status: DC

## 2015-07-25 MED ORDER — BUPROPION HCL ER (SR) 150 MG PO TB12: 150.0000 mg | ORAL_TABLET | Freq: Two times a day (BID) | ORAL | Status: DC

## 2015-07-25 NOTE — Assessment & Plan Note (Signed)
Improved with increase her wellbutrin. Continue current dose of wellbutrin and cymbalta.

## 2015-07-25 NOTE — Progress Notes (Signed)
Pre visit review using our clinic review tool, if applicable. No additional management support is needed unless otherwise documented below in the visit note. 

## 2015-07-25 NOTE — Assessment & Plan Note (Signed)
Obtain a1c, bmet.

## 2015-07-25 NOTE — Progress Notes (Signed)
Subjective:    Patient ID: Jordan [name], female    DOB: [birth year], 64 y.o.   MRN: [id number]  HPI  [name] is a 64 yr old female who presents today for follow up.  1) Depression- last visit she noted ongoing depression symptoms despite wellbutrin 75mg  bid and cymbalta.  Wellbutrin was increased from 75 mg bid to 150mg  bid.   2) Hyperglycemia-   Lab Results  Component Value Date   HGBA1C 6.4 03/15/2015   3) Dizziness-  Notes dizziness with positional change x 1 week. Has tried bonie and dramamine. Notes pressure in her head with standing. Pressure is located on the sides of her head.  Dizziness occurs with positional change.  She has hx of vertigo, but reports symptoms have been worse than this in the past.     Review of Systems See HPI  Past Medical History  Diagnosis Date  . Gout, unspecified   . Cellulitis and abscess of foot, except toes   . Obesity, unspecified   . Leg edema     chronic  . Cellulitis and abscess of finger, unspecified 401.9  . Depressive disorder, not elsewhere classified   . Insomnia   . Hyperlipidemia     borderline- not on meds  . GERD (gastroesophageal reflux disease)   . Osteoarthritis   . Hyperglycemia 05/08/2011  . Renal insufficiency, mild 05/08/2011    History   Social History  . Marital Status: Single    Spouse Name: N/A  . Number of Children: 2  . Years of Education: N/A   Occupational History  .  [employer]   Social History Main Topics  . Smoking status: Former Smoker -- 0.25 packs/day    Types: Cigarettes    Quit date: 12/26/2014  . Smokeless tobacco: Never Used  . Alcohol Use: 0.0 oz/week    0 Standard drinks or equivalent per week     Comment: rare  . Drug Use: No  . Sexual Activity: Not on file   Other Topics Concern  . Not on file   Social History Narrative    Past Surgical History  Procedure Laterality Date  . Appendectomy  2002  . Cholecystectomy  1977  . Total hip arthroplasty Left 1998  . Ectopic  pregnancy surgery  1971  . Abdominal hysterectomy  2002    infection- no history of cancer  . Right shoulder rotator cuff repair  06-07-10    Family History  Problem Relation Age of Onset  . Diabetes Mother   . Cancer Mother     colon/ pancreatic  . Colon cancer Mother 51  . Hypertension Father   . Alcohol abuse Father   . Cirrhosis Father   . Other Sister     Pyeoderma gangrenosis  . Obesity Daughter   . Diabetes Sister     type 2  . Heart attack Brother     No Known Allergies  Current Outpatient Prescriptions on File Prior to Visit  Medication Sig Dispense Refill  . allopurinol (ZYLOPRIM) 300 MG tablet TAKE 1/2 TABLET BY MOUTH 2 TIMES A DAY 90 tablet 1  . aspirin 81 MG tablet Take 81 mg by mouth daily.      Marland Kitchen buPROPion (WELLBUTRIN SR) 150 MG 12 hr tablet Take 1 tablet (150 mg total) by mouth 2 (two) times daily. 60 tablet 1  . DULoxetine (CYMBALTA) 60 MG capsule TAKE 1 CAPSULE (60 MG) BY MOUTH DAILY. 90 capsule 1  . furosemide (LASIX) 40 MG  tablet Take 1 tablet (40 mg total) by mouth daily as needed. 30 tablet 5  . meloxicam (MOBIC) 15 MG tablet Take 1 tablet (15 mg total) by mouth daily. 30 tablet 2  . Multiple Vitamins-Calcium (ONE-A-DAY WOMENS FORMULA) TABS Take 1 tablet by mouth daily.      . pantoprazole (PROTONIX) 40 MG tablet Take 1 tablet (40 mg total) by mouth daily. 90 tablet 1  . triamterene-hydrochlorothiazide (DYAZIDE) 37.5-25 MG per capsule TAKE 1 CAPSULE BY MOUTH EVERY MORNING 30 capsule 5  . [DISCONTINUED] famotidine (PEPCID AC) 10 MG chewable tablet Chew 10 mg by mouth daily.       No current facility-administered medications on file prior to visit.    BP 140/84 mmHg  Pulse 81  Temp(Src) 97.8 F (36.6 C) (Oral)  Resp 16  Ht 5\' 3"  (1.6 m)  Wt 276 lb 12.8 oz (125.556 kg)  BMI 49.05 kg/m2  SpO2 97%       Objective:   Physical Exam  Constitutional: She appears well-developed and well-nourished.  HENT:  Right Ear: Tympanic membrane and ear canal  normal.  Left Ear: Tympanic membrane and ear canal normal.  Mouth/Throat: No oropharyngeal exudate, posterior oropharyngeal edema or posterior oropharyngeal erythema.  Cardiovascular: Normal rate, regular rhythm and normal heart sounds.   No murmur heard. Pulmonary/Chest: Effort normal and breath sounds normal. No respiratory distress. She has no wheezes.  Psychiatric: She has a normal mood and affect. Her behavior is normal. Judgment and thought content normal.          Assessment & Plan:

## 2015-07-25 NOTE — Patient Instructions (Signed)
Please complete lab work prior to leaving. Continue current dose of wellbutrin. Follow up in 6 months.

## 2015-07-25 NOTE — Assessment & Plan Note (Signed)
Trial of meclizine 

## 2015-07-26 ENCOUNTER — Encounter: Payer: Self-pay | Admitting: Family

## 2015-07-31 ENCOUNTER — Telehealth: Payer: Self-pay | Admitting: Family

## 2015-07-31 DIAGNOSIS — H8110 Benign paroxysmal vertigo, unspecified ear: Secondary | ICD-10-CM

## 2015-07-31 NOTE — Telephone Encounter (Signed)
Please Advise

## 2015-07-31 NOTE — Telephone Encounter (Signed)
Reports dizziness is a little bit better overall but did have a bad spell on Saturday.  Worse when she turnt to right when she is laying down. Will refer for vestibular rehab. Reports pain in small toe + bruising, " I think I broke my little toe."  Advised buddy taping, she has a post-op shoe she can wear.  Tylenol prn.

## 2015-07-31 NOTE — Telephone Encounter (Signed)
Pt called in stating she is taking meclizine (ANTIVERT) 25 MG tablet but is still having some dizziness. Should she do anything else? Pt has been taking tylenol for pain as she was told not to take ibuprofen. Is there something else she can take (does not want narcotic pain meds)?

## 2015-08-10 ENCOUNTER — Telehealth: Payer: Self-pay | Admitting: Behavioral Health

## 2015-08-10 NOTE — Telephone Encounter (Signed)
Unable to reach patient at time of Pre-Visit Call.  Left message for patient to return call when available.    

## 2015-08-11 ENCOUNTER — Encounter: Payer: Self-pay | Admitting: Family

## 2015-08-11 ENCOUNTER — Ambulatory Visit (INDEPENDENT_AMBULATORY_CARE_PROVIDER_SITE_OTHER): Payer: 59 | Admitting: Family

## 2015-08-11 VITALS — BP 122/70 | HR 78 | Temp 98.0°F | Ht 63.75 in | Wt 280.8 lb

## 2015-08-11 DIAGNOSIS — Z780 Asymptomatic menopausal state: Secondary | ICD-10-CM

## 2015-08-11 DIAGNOSIS — Z0000 Encounter for general adult medical examination without abnormal findings: Secondary | ICD-10-CM | POA: Diagnosis not present

## 2015-08-11 DIAGNOSIS — Z23 Encounter for immunization: Secondary | ICD-10-CM

## 2015-08-11 LAB — TSH: TSH: 3.46 u[IU]/mL (ref 0.35–4.50)

## 2015-08-11 LAB — CBC WITH DIFFERENTIAL/PLATELET
Basophils Absolute: 0 K/uL (ref 0.0–0.1)
Basophils Relative: 0.5 % (ref 0.0–3.0)
Eosinophils Absolute: 0.1 K/uL (ref 0.0–0.7)
Eosinophils Relative: 2 % (ref 0.0–5.0)
HCT: 40.2 % (ref 36.0–46.0)
Hemoglobin: 13.1 g/dL (ref 12.0–15.0)
Lymphocytes Relative: 33.4 % (ref 12.0–46.0)
Lymphs Abs: 2.1 K/uL (ref 0.7–4.0)
MCHC: 32.5 g/dL (ref 30.0–36.0)
MCV: 94.5 fl (ref 78.0–100.0)
Monocytes Absolute: 0.3 K/uL (ref 0.1–1.0)
Monocytes Relative: 5.1 % (ref 3.0–12.0)
Neutro Abs: 3.8 K/uL (ref 1.4–7.7)
Neutrophils Relative %: 59 % (ref 43.0–77.0)
Platelets: 212 K/uL (ref 150.0–400.0)
RBC: 4.25 Mil/uL (ref 3.87–5.11)
RDW: 14.8 % (ref 11.5–15.5)
WBC: 6.4 K/uL (ref 4.0–10.5)

## 2015-08-11 LAB — HEPATIC FUNCTION PANEL
ALT: 28 U/L (ref 0–35)
AST: 20 U/L (ref 0–37)
Albumin: 4.2 g/dL (ref 3.5–5.2)
Alkaline Phosphatase: 96 U/L (ref 39–117)
Bilirubin, Direct: 0.1 mg/dL (ref 0.0–0.3)
Total Bilirubin: 0.4 mg/dL (ref 0.2–1.2)
Total Protein: 6.8 g/dL (ref 6.0–8.3)

## 2015-08-11 LAB — LIPID PANEL
Cholesterol: 150 mg/dL (ref 0–200)
HDL: 42.8 mg/dL (ref >39.00)
LDL Cholesterol: 77 mg/dL (ref 0–99)
NONHDL: 106.82
Total CHOL/HDL Ratio: 3
Triglycerides: 147 mg/dL (ref 0.0–149.0)
VLDL: 29.4 mg/dL (ref 0.0–40.0)

## 2015-08-11 LAB — URINALYSIS, ROUTINE W REFLEX MICROSCOPIC
Bilirubin Urine: NEGATIVE
HGB URINE DIPSTICK: NEGATIVE
Ketones, ur: NEGATIVE
Nitrite: NEGATIVE
Specific Gravity, Urine: 1.015 (ref 1.000–1.030)
Total Protein, Urine: NEGATIVE
Urine Glucose: NEGATIVE
Urobilinogen, UA: 0.2 (ref 0.0–1.0)
pH: 5.5 (ref 5.0–8.0)

## 2015-08-11 NOTE — Progress Notes (Signed)
Pre visit review using our clinic review tool, if applicable. No additional management support is needed unless otherwise documented below in the visit note. 

## 2015-08-11 NOTE — Patient Instructions (Signed)
Please complete lab work prior to leaving. Schedule mammogram and bone density on the first floor. Work on diet, exercise weight loss.

## 2015-08-11 NOTE — Assessment & Plan Note (Signed)
Discussed diet/exercise/weight loss. Commended patient for ongoing smoking cessation.  Refer for mammo, dexa and routine lab work. Tdap today.

## 2015-08-11 NOTE — Progress Notes (Signed)
Subjective:    Patient ID: Jordan [name], female    DOB: [birth year], 64 y.o.   MRN: [id number]  HPI  Jordan [name] is a 64 yr old female who presents today for CPX.  Patient presents today for complete physical.  Immunizations: due for tetanus Diet: Wt Readings from Last 3 Encounters:  08/11/15 280 lb 12.8 oz (127.37 kg)  07/25/15 276 lb 12.8 oz (125.556 kg)  06/14/15 278 lb 9.6 oz (126.372 kg)  Exercise: reports that she has been in the pool Colonoscopy: 01/14/14- severe diverticulosis- recommended 5 year follow up.  Dexa: 06/07/11 Pap Smear: hysterectomy Mammogram: 10/27/13 Dental:  Up to date   Review of Systems  Constitutional: Positive for unexpected weight change.  HENT: Negative for hearing loss and rhinorrhea.   Eyes: Negative for visual disturbance.  Respiratory: Negative for cough and shortness of breath.   Cardiovascular: Negative for chest pain.       Reports swelling has been improved  Gastrointestinal: Negative for nausea, diarrhea and constipation.  Genitourinary: Negative for dysuria and frequency.  Musculoskeletal: Negative for myalgias.       Some knee pain, hip pain, sciatica sxs  Skin: Negative for rash.  Neurological: Negative for headaches.  Hematological: Negative for adenopathy.  Psychiatric/Behavioral: Negative for dysphoric mood and agitation.       Reports that mood is generally stable.  Irritable sometimes. Has multiple family members staying with her- this is stressful   Past Medical History  Diagnosis Date  . Gout, unspecified   . Cellulitis and abscess of foot, except toes   . Obesity, unspecified   . Leg edema     chronic  . Cellulitis and abscess of finger, unspecified 401.9  . Depressive disorder, not elsewhere classified   . Insomnia   . Hyperlipidemia     borderline- not on meds  . GERD (gastroesophageal reflux disease)   . Osteoarthritis   . Hyperglycemia 05/08/2011  . Renal insufficiency, mild 05/08/2011    Social History    Social History  . Marital Status: Single    Spouse Name: N/A  . Number of Children: 2  . Years of Education: N/A   Occupational History  .  [employer]   Social History Main Topics  . Smoking status: Former Smoker -- 0.25 packs/day    Types: Cigarettes    Quit date: 12/26/2014  . Smokeless tobacco: Never Used  . Alcohol Use: 0.0 oz/week    0 Standard drinks or equivalent per week     Comment: rare  . Drug Use: No  . Sexual Activity: Not on file   Other Topics Concern  . Not on file   Social History Narrative   2 children (daughter and son) both local. 3 grandchildren, one great grandchild   Works at ED front desk   Divorced (married x 30 years)       Past Surgical History  Procedure Laterality Date  . Appendectomy  2002  . Cholecystectomy  1977  . Total hip arthroplasty Left 1998  . Ectopic pregnancy surgery  1971  . Abdominal hysterectomy  2002    infection- no history of cancer  . Right shoulder rotator cuff repair  06-07-10    Family History  Problem Relation Age of Onset  . Diabetes Mother   . Cancer Mother     colon/ pancreatic  . Colon cancer Mother 26  . Hypertension Father   . Alcohol abuse Father   . Cirrhosis Father   . Other  Sister     Jordan [name]  . Obesity Daughter   . Diabetes Sister     type 2  . Heart attack Brother   . Cirrhosis Sister     had NASH, died heart failure    No Known Allergies  Current Outpatient Prescriptions on File Prior to Visit  Medication Sig Dispense Refill  . allopurinol (ZYLOPRIM) 300 MG tablet TAKE 1/2 TABLET BY MOUTH 2 TIMES A DAY 90 tablet 1  . aspirin 81 MG tablet Take 81 mg by mouth daily.      Marland Kitchen buPROPion (WELLBUTRIN SR) 150 MG 12 hr tablet Take 1 tablet (150 mg total) by mouth 2 (two) times daily. 180 tablet 1  . DULoxetine (CYMBALTA) 60 MG capsule TAKE 1 CAPSULE (60 MG) BY MOUTH DAILY. 90 capsule 1  . furosemide (LASIX) 40 MG tablet Take 1 tablet (40 mg total) by mouth daily as needed. 90  tablet 1  . meclizine (ANTIVERT) 25 MG tablet Take 1 tablet (25 mg total) by mouth 2 (two) times daily as needed for dizziness. 30 tablet 0  . Multiple Vitamins-Calcium (ONE-A-DAY WOMENS FORMULA) TABS Take 1 tablet by mouth daily.      . pantoprazole (PROTONIX) 40 MG tablet Take 1 tablet (40 mg total) by mouth daily. 90 tablet 1  . triamterene-hydrochlorothiazide (DYAZIDE) 37.5-25 MG per capsule TAKE 1 CAPSULE BY MOUTH EVERY MORNING 30 capsule 5  . meloxicam (MOBIC) 15 MG tablet Take 1 tablet (15 mg total) by mouth daily. (Patient not taking: Reported on 08/11/2015) 30 tablet 2  . [DISCONTINUED] famotidine (PEPCID AC) 10 MG chewable tablet Chew 10 mg by mouth daily.       No current facility-administered medications on file prior to visit.    BP 122/70 mmHg  Pulse 78  Temp(Src) 98 F (36.7 C) (Oral)  Ht 5' 3.75" (1.619 m)  Wt 280 lb 12.8 oz (127.37 kg)  BMI 48.59 kg/m2  SpO2 98%       Objective:   Physical Exam  Physical Exam  Constitutional: She is oriented to person, place, and time. She appears well-developed and well-nourished. No distress.  HENT:  Head: Normocephalic and atraumatic.  Right Ear: Tympanic membrane and ear canal normal.  Left Ear: Tympanic membrane and ear canal normal.  Mouth/Throat: Oropharynx is clear and moist.  Eyes: Pupils are equal, round, and reactive to light. No scleral icterus.  Neck: Normal range of motion. No thyromegaly present.  Cardiovascular: Normal rate and regular rhythm.   No murmur heard. Pulmonary/Chest: Effort normal and breath sounds normal. No respiratory distress. He has no wheezes. She has no rales. She exhibits no tenderness.  Abdominal: Soft. Bowel sounds are normal. He exhibits no distension and no mass. There is no tenderness. There is no rebound and no guarding.  Musculoskeletal: She exhibits no edema.  Lymphadenopathy:    She has no cervical adenopathy.  Neurological: She is alert and oriented to person, place, and time. She  has normal patellar reflexes. She exhibits normal muscle tone. Coordination normal.  Skin: Skin is warm and dry.  Psychiatric: She has a normal mood and affect. Her behavior is normal. Judgment and thought content normal.  Breasts: Examined lying Right: Without masses, retractions, discharge or axillary adenopathy. Bilateral nipple inversion noted Left: Without masses, retractions, discharge or axillary adenopathy.  Pelvic: deferred.         Assessment & Plan:         Assessment & Plan:  EKG tracing is personally reviewed.  EKG notes NSR.  No acute changes.

## 2015-08-12 ENCOUNTER — Encounter: Payer: Self-pay | Admitting: Family

## 2015-09-01 ENCOUNTER — Ambulatory Visit (HOSPITAL_BASED_OUTPATIENT_CLINIC_OR_DEPARTMENT_OTHER)
Admission: RE | Admit: 2015-09-01 | Discharge: 2015-09-01 | Disposition: A | Discharge status: Home / Self Care | Payer: 59 | Source: Ambulatory Visit | Attending: Family | Admitting: Family

## 2015-09-01 DIAGNOSIS — Z780 Asymptomatic menopausal state: Secondary | ICD-10-CM | POA: Insufficient documentation

## 2015-09-01 DIAGNOSIS — Z0000 Encounter for general adult medical examination without abnormal findings: Secondary | ICD-10-CM

## 2015-09-01 DIAGNOSIS — Z1231 Encounter for screening mammogram for malignant neoplasm of breast: Secondary | ICD-10-CM | POA: Diagnosis present

## 2015-09-06 ENCOUNTER — Encounter: Payer: Self-pay | Admitting: Family

## 2015-09-20 ENCOUNTER — Encounter: Payer: Self-pay | Admitting: Family

## 2015-11-21 ENCOUNTER — Ambulatory Visit: Payer: 59 | Admitting: Family

## 2015-11-22 ENCOUNTER — Ambulatory Visit: Payer: 59 | Admitting: Family

## 2015-11-29 ENCOUNTER — Encounter: Payer: Self-pay | Admitting: Family

## 2015-11-29 ENCOUNTER — Ambulatory Visit (INDEPENDENT_AMBULATORY_CARE_PROVIDER_SITE_OTHER): Payer: 59 | Admitting: Family

## 2015-11-29 VITALS — BP 130/71 | HR 78 | Temp 98.0°F | Resp 18 | Ht 63.75 in | Wt 278.6 lb

## 2015-11-29 DIAGNOSIS — E785 Hyperlipidemia, unspecified: Secondary | ICD-10-CM

## 2015-11-29 DIAGNOSIS — I10 Essential (primary) hypertension: Secondary | ICD-10-CM

## 2015-11-29 DIAGNOSIS — F32A Depression, unspecified: Secondary | ICD-10-CM

## 2015-11-29 DIAGNOSIS — M109 Gout, unspecified: Secondary | ICD-10-CM

## 2015-11-29 DIAGNOSIS — R739 Hyperglycemia, unspecified: Secondary | ICD-10-CM | POA: Diagnosis not present

## 2015-11-29 DIAGNOSIS — F329 Major depressive disorder, single episode, unspecified: Secondary | ICD-10-CM

## 2015-11-29 DIAGNOSIS — Z0000 Encounter for general adult medical examination without abnormal findings: Secondary | ICD-10-CM

## 2015-11-29 LAB — BASIC METABOLIC PANEL
BUN: 17 mg/dL (ref 6–23)
CHLORIDE: 100 meq/L (ref 96–112)
CO2: 27 meq/L (ref 19–32)
Calcium: 9.7 mg/dL (ref 8.4–10.5)
Creatinine, Ser: 1.06 mg/dL (ref 0.40–1.20)
GFR: 55.26 mL/min — ABNORMAL LOW (ref >60.00)
GLUCOSE: 101 mg/dL — AB (ref 70–99)
POTASSIUM: 3.3 meq/L — AB (ref 3.5–5.1)
SODIUM: 138 meq/L (ref 135–145)

## 2015-11-29 LAB — HEMOGLOBIN A1C: Hgb A1c MFr Bld: 6 % (ref 4.6–6.5)

## 2015-11-29 LAB — HIV ANTIBODY (ROUTINE TESTING W REFLEX): HIV: NONREACTIVE

## 2015-11-29 MED ORDER — DULOXETINE HCL 60 MG PO CPEP: ORAL_CAPSULE | ORAL | Status: DC

## 2015-11-29 MED ORDER — TRIAMTERENE-HCTZ 37.5-25 MG PO CAPS: 1.0000 | ORAL_CAPSULE | Freq: Every morning | ORAL | Status: DC

## 2015-11-29 MED ORDER — ALLOPURINOL 300 MG PO TABS
ORAL_TABLET | ORAL | Status: DC
Start: 2015-11-29 — End: 2016-07-28

## 2015-11-29 NOTE — Patient Instructions (Signed)
Please complete lab work prior to leaving.   

## 2015-11-29 NOTE — Assessment & Plan Note (Signed)
Stable on current meds, continue same.  

## 2015-11-29 NOTE — Assessment & Plan Note (Signed)
No flares on allopurinol, continue same.

## 2015-11-29 NOTE — Assessment & Plan Note (Signed)
BP stable, continue current meds. 

## 2015-11-29 NOTE — Assessment & Plan Note (Signed)
Stable with low fat diet, continue same.

## 2015-11-29 NOTE — Progress Notes (Signed)
Pre visit review using our clinic review tool, if applicable. No additional management support is needed unless otherwise documented below in the visit note. 

## 2015-11-29 NOTE — Assessment & Plan Note (Signed)
Obtain A1C, clinically stable.

## 2015-11-29 NOTE — Progress Notes (Signed)
Subjective:    Patient ID: Jordan [name], female    DOB: 26-Nov-1951, 64 y.o.   MRN: [id number]  HPI  Jordan [name] is a 64 yr old female who presents today for follow up.  1) Depression- maintained on cymbalta, wellbutrin.  Reports mood is good.  2) HTN- maintained on dyazide.  Uses lasix prn on the weekends after so has worked all week.   BP Readings from Last 3 Encounters:  11/29/15 130/71  08/11/15 122/70  07/25/15 140/84   3) Hyperlipidemia- low fat diet, no meds Lab Results  Component Value Date   CHOL 150 08/11/2015   HDL 42.80 08/11/2015   LDLCALC 77 08/11/2015   TRIG 147.0 08/11/2015   CHOLHDL 3 08/11/2015   4) Gout- maintained on allopurinol. Stable on allopurinol- no gout flares.    5) Arthritis- continues to have back and hip pain- worse with walking, working around the house.     Review of Systems  Respiratory: Negative for shortness of breath.   Cardiovascular: Negative for chest pain and leg swelling.       Past Medical History  Diagnosis Date  . Gout, unspecified   . Cellulitis and abscess of foot, except toes   . Obesity, unspecified   . Leg edema     chronic  . Cellulitis and abscess of finger, unspecified 401.9  . Depressive disorder, not elsewhere classified   . Insomnia   . Hyperlipidemia     borderline- not on meds  . GERD (gastroesophageal reflux disease)   . Osteoarthritis   . Hyperglycemia 05/08/2011  . Renal insufficiency, mild 05/08/2011    Social History   Social History  . Marital Status: Single    Spouse Name: N/A  . Number of Children: 2  . Years of Education: N/A   Occupational History  .  [employer]   Social History Main Topics  . Smoking status: Former Smoker -- 0.25 packs/day    Types: Cigarettes    Quit date: 12/26/2014  . Smokeless tobacco: Never Used  . Alcohol Use: 0.0 oz/week    0 Standard drinks or equivalent per week     Comment: rare  . Drug Use: No  . Sexual Activity: Not on file   Other Topics Concern   . Not on file   Social History Narrative   2 children (daughter and son) both local. 3 grandchildren, one great grandchild   Works at ED front desk   Divorced (married x 30 years)       Past Surgical History  Procedure Laterality Date  . Appendectomy  2002  . Cholecystectomy  1977  . Total hip arthroplasty Left 1998  . Ectopic pregnancy surgery  1971  . Abdominal hysterectomy  2002    infection- no history of cancer  . Right shoulder rotator cuff repair  06-07-10    Family History  Problem Relation Age of Onset  . Diabetes Mother   . Cancer Mother     colon/ pancreatic  . Colon cancer Mother 56  . Hypertension Father   . Alcohol abuse Father   . Cirrhosis Father   . Other Sister     Pyeoderma gangrenosis  . Obesity Daughter   . Diabetes Sister     type 2  . Heart attack Brother   . Cirrhosis Sister     had NASH, died heart failure    No Known Allergies  Current Outpatient Prescriptions on File Prior to Visit  Medication Sig Dispense Refill  .  aspirin 81 MG tablet Take 81 mg by mouth daily.      Marland Kitchen buPROPion (WELLBUTRIN SR) 150 MG 12 hr tablet Take 1 tablet (150 mg total) by mouth 2 (two) times daily. 180 tablet 1  . furosemide (LASIX) 40 MG tablet Take 1 tablet (40 mg total) by mouth daily as needed. 90 tablet 1  . Multiple Vitamins-Calcium (ONE-A-DAY WOMENS FORMULA) TABS Take 1 tablet by mouth daily.      . pantoprazole (PROTONIX) 40 MG tablet Take 1 tablet (40 mg total) by mouth daily. 90 tablet 1  . [DISCONTINUED] famotidine (PEPCID AC) 10 MG chewable tablet Chew 10 mg by mouth daily.       No current facility-administered medications on file prior to visit.    BP 130/71 mmHg  Pulse 78  Temp(Src) 98 F (36.7 C) (Oral)  Resp 18  Ht 5' 3.75" (1.619 m)  Wt 278 lb 9.6 oz (126.372 kg)  BMI 48.21 kg/m2  SpO2 98%     Objective:   Physical Exam  Constitutional: She is oriented to person, place, and time. She appears well-developed and well-nourished.    HENT:  Head: Normocephalic and atraumatic.  Cardiovascular: Normal rate, regular rhythm and normal heart sounds.   No murmur heard. Pulmonary/Chest: Effort normal and breath sounds normal. No respiratory distress. She has no wheezes.  Musculoskeletal: She exhibits no edema.  Neurological: She is alert and oriented to person, place, and time.  Skin: Skin is warm and dry.  Psychiatric: She has a normal mood and affect. Her behavior is normal. Judgment and thought content normal.          Assessment & Plan:

## 2015-11-30 ENCOUNTER — Other Ambulatory Visit: Payer: Self-pay | Admitting: Family

## 2015-11-30 DIAGNOSIS — E876 Hypokalemia: Secondary | ICD-10-CM

## 2015-11-30 MED ORDER — POTASSIUM CHLORIDE CRYS ER 20 MEQ PO TBCR: 20.0000 meq | EXTENDED_RELEASE_TABLET | Freq: Every day | ORAL | Status: DC

## 2015-11-30 NOTE — Progress Notes (Signed)
K+ is low. Start kdur once daily, repeat bmet in 2 weeks.

## 2015-11-30 NOTE — Progress Notes (Signed)
Unable to reach patient at time of call. Left a detailed message regarding the provider's recommendations below and to return call when available to schedule a lab appointment.

## 2016-01-01 ENCOUNTER — Other Ambulatory Visit: Payer: Self-pay | Admitting: Family

## 2016-01-01 MED FILL — POTASSIUM CL ER 20 MEQ TABL: 20 | 30 days supply | Qty: 30 | Fill #1

## 2016-01-04 MED FILL — PANTOPRAZOLE SOD DR 40 MG T: 40 | 90 days supply | Qty: 90 | Fill #0

## 2016-01-12 ENCOUNTER — Encounter: Payer: Self-pay | Admitting: Family Medicine

## 2016-01-12 ENCOUNTER — Ambulatory Visit (INDEPENDENT_AMBULATORY_CARE_PROVIDER_SITE_OTHER): Payer: Medicare Other | Admitting: Family Medicine

## 2016-01-12 VITALS — BP 130/84 | HR 76 | Ht 64.0 in

## 2016-01-12 DIAGNOSIS — M25532 Pain in left wrist: Secondary | ICD-10-CM | POA: Diagnosis not present

## 2016-01-12 DIAGNOSIS — M778 Other enthesopathies, not elsewhere classified: Secondary | ICD-10-CM

## 2016-01-12 MED ORDER — METHYLPREDNISOLONE ACETATE 40 MG/ML IJ SUSP
40.0000 mg | Freq: Once | INTRAMUSCULAR | Status: AC
  Administered 2016-01-12: 40 mg via INTRA_ARTICULAR

## 2016-01-12 NOTE — Patient Instructions (Signed)
You have an extensor tendinitis of your wrist. You were given a cortisone shot today. Wear the brace until pain has resolved. Icing 15 minutes at a time 3-4 times a day. Call us if you want to try occupational therapy for the extensor tendinitis you have after a couple weeks if shot hasn't helped enough. Ibuprofen or aleve should help. Follow up with me in 6 weeks otherwise.

## 2016-01-15 DIAGNOSIS — M25532 Pain in left wrist: Secondary | ICD-10-CM | POA: Insufficient documentation

## 2016-01-15 NOTE — Assessment & Plan Note (Signed)
Extensor tendinitis of left wrist - specifically extensor digitorum.  Discussed options - continue with brace, icing.  She opted for cortisone injection.  F/u in 6 weeks.  Consider occupational therapy.  After informed written consent patient was seated in chair in exam room.  Area overlying left extensor digitorum at wrist prepped dorsally with alcohol swab then injected with 1:62mL marcaine: depomedrol.  Patient tolerated procedure well without immediate complications.

## 2016-01-15 NOTE — Progress Notes (Signed)
PCP and referred by: Nance Pear., NP  Subjective:   HPI: Patient is a 65 y.o. female here for left wrist pain.  3/24: Patient reports she's had left wrist pain for about 6 weeks. No known injury or trauma. 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 Some swelling. Wearing a wrist brace which has helped some though not a thumb spica. Right handed.  04/27/15: Patient reports pain feels about the same as last visit. Getting some pain on dorsal aspect of hand now too. Wearing brace regularly. Pain level 2/10 currently.  01/12/16: Patient reports she felt completely better until about 2 weeks ago. Pain in dorsal aspect of left wrist, sharp. Has not been icing or using meds. Worse with motions of wrist especially extension. No skin changes, fever, other complaints.  Past Medical History  Diagnosis Date  . Gout, unspecified   . Cellulitis and abscess of foot, except toes   . Obesity, unspecified   . Leg edema     chronic  . Cellulitis and abscess of finger, unspecified 401.9  . Depressive disorder, not elsewhere classified   . Insomnia   . Hyperlipidemia     borderline- not on meds  . GERD (gastroesophageal reflux disease)   . Osteoarthritis   . Hyperglycemia 05/08/2011  . Renal insufficiency, mild 05/08/2011    Current Outpatient Prescriptions on File Prior to Visit  Medication Sig Dispense Refill  . allopurinol (ZYLOPRIM) 300 MG tablet TAKE 1/2 TABLET BY MOUTH 2 TIMES A DAY 90 tablet 1  . aspirin 81 MG tablet Take 81 mg by mouth daily.      Marland Kitchen buPROPion (WELLBUTRIN SR) 150 MG 12 hr tablet Take 1 tablet (150 mg total) by mouth 2 (two) times daily. 180 tablet 1  . DULoxetine (CYMBALTA) 60 MG capsule TAKE 1 CAPSULE (60 MG) BY MOUTH DAILY. 90 capsule 1  . furosemide (LASIX) 40 MG tablet Take 1 tablet (40 mg total) by mouth daily as needed. 90 tablet 1  . Multiple Vitamins-Calcium (ONE-A-DAY WOMENS FORMULA) TABS  Take 1 tablet by mouth daily.      . pantoprazole (PROTONIX) 40 MG tablet Take 1 tablet (40 mg total) by mouth daily. 90 tablet 1  . potassium chloride SA (K-DUR,KLOR-CON) 20 MEQ tablet Take 1 tablet (20 mEq total) by mouth daily. 30 tablet 3  . triamterene-hydrochlorothiazide (DYAZIDE) 37.5-25 MG capsule Take 1 each (1 capsule total) by mouth every morning. 90 capsule 1  . [DISCONTINUED] famotidine (PEPCID AC) 10 MG chewable tablet Chew 10 mg by mouth daily.       No current facility-administered medications on file prior to visit.    Past Surgical History  Procedure Laterality Date  . Appendectomy  2002  . Cholecystectomy  1977  . Total hip arthroplasty Left 1998  . Ectopic pregnancy surgery  1971  . Abdominal hysterectomy  2002    infection- no history of cancer  . Right shoulder rotator cuff repair  06-07-10    No Known Allergies  Social History   Social History  . Marital Status: Single    Spouse Name: N/A  . Number of Children: 2  . Years of Education: N/A   Occupational History  .  [employer]   Social History Main Topics  . Smoking status: Former Smoker -- 0.25 packs/day    Types: Cigarettes    Quit date: 12/26/2014  . Smokeless tobacco: Never Used  . Alcohol Use: 0.0 oz/week  0 Standard drinks or equivalent per week     Comment: rare  . Drug Use: No  . Sexual Activity: Not on file   Other Topics Concern  . Not on file   Social History Narrative   2 children (daughter and son) both local. 3 grandchildren, one great grandchild   Works at ED front desk   Divorced (married x 30 years)       Family History  Problem Relation Age of Onset  . Diabetes Mother   . Cancer Mother     colon/ pancreatic  . Colon cancer Mother 46  . Hypertension Father   . Alcohol abuse Father   . Cirrhosis Father   . Other Sister     Pyeoderma gangrenosis  . Obesity Daughter   . Diabetes Sister     type 2  . Heart attack Brother   . Cirrhosis Sister     had NASH, died  heart failure    BP 130/84 mmHg  Pulse 76  Ht 5\' 4"  (1.626 m)  Review of Systems: See HPI above.    Objective:  Physical Exam:  Gen: NAD, comfortable in exam room.  Left wrist: No gross deformity, swelling, bruising. TTP dorsal wrist over extensor digitorum. No other tenderness. FROM with pain on wrist extension, 3rd digit extension. Sensation intact to light touch. Negative tinels. Negative finkelsteins. NVI distally.  Assessment & Plan:  1. Extensor tendinitis of left wrist - specifically extensor digitorum.  Discussed options - continue with brace, icing.  She opted for cortisone injection.  F/u in 6 weeks.  Consider occupational therapy.  After informed written consent patient was seated in chair in exam room.  Area overlying left extensor digitorum at wrist prepped dorsally with alcohol swab then injected with 1:31mL marcaine: depomedrol.  Patient tolerated procedure well without immediate complications.

## 2016-01-24 MED FILL — FUROSEMIDE 40 MG TABLET: 40 | 90 days supply | Qty: 90 | Fill #1

## 2016-01-24 MED FILL — TRIAMTERENE/HCTZ 37.5/25 CP: 37.5-25 | 90 days supply | Qty: 90 | Fill #0

## 2016-01-24 MED FILL — ALLOPURINOL 300 MG TABLET: 300 | 90 days supply | Qty: 90 | Fill #0

## 2016-01-31 MED FILL — DULoxetine HCL 60 MG CPEP: 60 | 90 days supply | Qty: 90 | Fill #0

## 2016-02-19 ENCOUNTER — Other Ambulatory Visit: Payer: Self-pay | Admitting: Family

## 2016-02-19 MED FILL — BUPROPION SR 150 MG TABLET: 150 | 90 days supply | Qty: 180 | Fill #0

## 2016-02-19 MED FILL — POTASSIUM CL ER 20 MEQ TABL: 20 | 30 days supply | Qty: 30 | Fill #2

## 2016-02-26 ENCOUNTER — Ambulatory Visit: Payer: 59 | Admitting: Family

## 2016-04-01 ENCOUNTER — Other Ambulatory Visit: Payer: Self-pay | Admitting: Family

## 2016-04-02 MED FILL — PANTOPRAZOLE SOD DR 40 MG T: 40 | 90 days supply | Qty: 90 | Fill #0

## 2016-05-02 MED FILL — POTASSIUM CL ER 20 MEQ TABL: 20 | 30 days supply | Qty: 30 | Fill #3

## 2016-05-02 MED FILL — DULoxetine HCL 60 MG CPEP: 60 | 90 days supply | Qty: 90 | Fill #1

## 2016-05-02 MED FILL — ALLOPURINOL 300 MG TABLET: 300 | 90 days supply | Qty: 90 | Fill #1

## 2016-05-02 MED FILL — TRIAMTERENE/HCTZ 37.5/25 CP: 37.5-25 | 90 days supply | Qty: 90 | Fill #1

## 2016-06-14 ENCOUNTER — Other Ambulatory Visit: Payer: Self-pay | Admitting: Family

## 2016-06-17 MED FILL — PANTOPRAZOLE SOD DR 40 MG T: 40 | 90 days supply | Qty: 90 | Fill #0

## 2016-06-17 NOTE — Telephone Encounter (Signed)
Rx sent to the pharmacy by e-script.//AB/CMA 

## 2016-07-25 DIAGNOSIS — A084 Viral intestinal infection, unspecified: Secondary | ICD-10-CM | POA: Diagnosis not present

## 2016-07-27 ENCOUNTER — Inpatient Hospital Stay (HOSPITAL_BASED_OUTPATIENT_CLINIC_OR_DEPARTMENT_OTHER)
Admission: EM | Admit: 2016-07-27 | Discharge: 2016-07-31 | DRG: 389 | Disposition: A | Discharge status: Home / Self Care | Payer: Medicare Other | Attending: Surgery | Admitting: Surgery

## 2016-07-27 ENCOUNTER — Emergency Department (HOSPITAL_BASED_OUTPATIENT_CLINIC_OR_DEPARTMENT_OTHER): Payer: Medicare Other

## 2016-07-27 ENCOUNTER — Encounter (HOSPITAL_BASED_OUTPATIENT_CLINIC_OR_DEPARTMENT_OTHER): Payer: Self-pay | Admitting: *Deleted

## 2016-07-27 DIAGNOSIS — K219 Gastro-esophageal reflux disease without esophagitis: Secondary | ICD-10-CM | POA: Diagnosis present

## 2016-07-27 DIAGNOSIS — N289 Disorder of kidney and ureter, unspecified: Secondary | ICD-10-CM | POA: Diagnosis present

## 2016-07-27 DIAGNOSIS — R1010 Upper abdominal pain, unspecified: Secondary | ICD-10-CM

## 2016-07-27 DIAGNOSIS — Z90710 Acquired absence of both cervix and uterus: Secondary | ICD-10-CM

## 2016-07-27 DIAGNOSIS — K565 Intestinal adhesions [bands] with obstruction (postprocedural) (postinfection): Principal | ICD-10-CM | POA: Diagnosis present

## 2016-07-27 DIAGNOSIS — E785 Hyperlipidemia, unspecified: Secondary | ICD-10-CM | POA: Diagnosis present

## 2016-07-27 DIAGNOSIS — N200 Calculus of kidney: Secondary | ICD-10-CM | POA: Diagnosis not present

## 2016-07-27 DIAGNOSIS — R112 Nausea with vomiting, unspecified: Secondary | ICD-10-CM

## 2016-07-27 DIAGNOSIS — Z6841 Body Mass Index (BMI) 40.0 and over, adult: Secondary | ICD-10-CM | POA: Diagnosis not present

## 2016-07-27 DIAGNOSIS — K56609 Unspecified intestinal obstruction, unspecified as to partial versus complete obstruction: Secondary | ICD-10-CM

## 2016-07-27 DIAGNOSIS — E876 Hypokalemia: Secondary | ICD-10-CM | POA: Diagnosis not present

## 2016-07-27 DIAGNOSIS — Z833 Family history of diabetes mellitus: Secondary | ICD-10-CM

## 2016-07-27 DIAGNOSIS — Z79899 Other long term (current) drug therapy: Secondary | ICD-10-CM

## 2016-07-27 DIAGNOSIS — F329 Major depressive disorder, single episode, unspecified: Secondary | ICD-10-CM | POA: Diagnosis present

## 2016-07-27 DIAGNOSIS — M109 Gout, unspecified: Secondary | ICD-10-CM | POA: Diagnosis present

## 2016-07-27 DIAGNOSIS — Z87891 Personal history of nicotine dependence: Secondary | ICD-10-CM

## 2016-07-27 DIAGNOSIS — Z9049 Acquired absence of other specified parts of digestive tract: Secondary | ICD-10-CM

## 2016-07-27 DIAGNOSIS — E860 Dehydration: Secondary | ICD-10-CM | POA: Diagnosis not present

## 2016-07-27 DIAGNOSIS — Z8249 Family history of ischemic heart disease and other diseases of the circulatory system: Secondary | ICD-10-CM

## 2016-07-27 DIAGNOSIS — R7989 Other specified abnormal findings of blood chemistry: Secondary | ICD-10-CM | POA: Diagnosis present

## 2016-07-27 DIAGNOSIS — Z96642 Presence of left artificial hip joint: Secondary | ICD-10-CM | POA: Diagnosis present

## 2016-07-27 DIAGNOSIS — Z7982 Long term (current) use of aspirin: Secondary | ICD-10-CM

## 2016-07-27 DIAGNOSIS — Z800 Family history of malignant neoplasm of digestive organs: Secondary | ICD-10-CM

## 2016-07-27 LAB — COMPREHENSIVE METABOLIC PANEL
ALT: 36 U/L (ref 14–54)
AST: 27 U/L (ref 15–41)
Albumin: 4.8 g/dL (ref 3.5–5.0)
Alkaline Phosphatase: 113 U/L (ref 38–126)
Anion gap: 14 (ref 5–15)
BUN: 50 mg/dL — ABNORMAL HIGH (ref 6–20)
CO2: 24 mmol/L (ref 22–32)
Calcium: 9.5 mg/dL (ref 8.9–10.3)
Chloride: 93 mmol/L — ABNORMAL LOW (ref 101–111)
Creatinine, Ser: 2.04 mg/dL — ABNORMAL HIGH (ref 0.44–1.00)
GFR calc Af Amer: 28 mL/min — ABNORMAL LOW (ref >60)
GFR calc non Af Amer: 24 mL/min — ABNORMAL LOW (ref >60)
Glucose, Bld: 161 mg/dL — ABNORMAL HIGH (ref 65–99)
Potassium: 3.4 mmol/L — ABNORMAL LOW (ref 3.5–5.1)
Sodium: 131 mmol/L — ABNORMAL LOW (ref 135–145)
Total Bilirubin: 0.9 mg/dL (ref 0.3–1.2)
Total Protein: 8 g/dL (ref 6.5–8.1)

## 2016-07-27 LAB — CBC WITH DIFFERENTIAL/PLATELET
Basophils Absolute: 0 10*3/uL (ref 0.0–0.1)
Basophils Relative: 0 %
Eosinophils Absolute: 0.1 10*3/uL (ref 0.0–0.7)
Eosinophils Relative: 1 %
HCT: 46.4 % — ABNORMAL HIGH (ref 36.0–46.0)
Hemoglobin: 16.4 g/dL — ABNORMAL HIGH (ref 12.0–15.0)
Lymphocytes Relative: 24 %
Lymphs Abs: 2.4 10*3/uL (ref 0.7–4.0)
MCH: 31.4 pg (ref 26.0–34.0)
MCHC: 35.3 g/dL (ref 30.0–36.0)
MCV: 88.7 fL (ref 78.0–100.0)
Monocytes Absolute: 0.6 10*3/uL (ref 0.1–1.0)
Monocytes Relative: 6 %
Neutro Abs: 7 10*3/uL (ref 1.7–7.7)
Neutrophils Relative %: 69 %
Platelets: 272 10*3/uL (ref 150–400)
RBC: 5.23 MIL/uL — ABNORMAL HIGH (ref 3.87–5.11)
RDW: 13.4 % (ref 11.5–15.5)
WBC: 10.1 10*3/uL (ref 4.0–10.5)

## 2016-07-27 LAB — LIPASE, BLOOD: Lipase: 22 U/L (ref 11–51)

## 2016-07-27 MED ORDER — ONDANSETRON HCL 4 MG/2ML IJ SOLN
4.0000 mg | Freq: Once | INTRAMUSCULAR | Status: AC
  Administered 2016-07-27: 4 mg via INTRAVENOUS
  Filled 2016-07-27: qty 2

## 2016-07-27 MED ORDER — FAMOTIDINE IN NACL 20-0.9 MG/50ML-% IV SOLN
20.0000 mg | Freq: Once | INTRAVENOUS | Status: AC
  Administered 2016-07-27: 20 mg via INTRAVENOUS
  Filled 2016-07-27: qty 50

## 2016-07-27 MED ORDER — SODIUM CHLORIDE 0.9 % IV BOLUS (SEPSIS)
1000.0000 mL | Freq: Once | INTRAVENOUS | Status: AC
  Administered 2016-07-27: 1000 mL via INTRAVENOUS

## 2016-07-27 MED ORDER — MORPHINE SULFATE (PF) 4 MG/ML IV SOLN
4.0000 mg | Freq: Once | INTRAVENOUS | Status: AC
  Administered 2016-07-27: 4 mg via INTRAVENOUS
  Filled 2016-07-27: qty 1

## 2016-07-27 MED ORDER — ONDANSETRON HCL 4 MG/2ML IJ SOLN
INTRAMUSCULAR | Status: AC
  Administered 2016-07-27: 4 mg via INTRAVENOUS
  Filled 2016-07-27: qty 2

## 2016-07-27 MED ORDER — ONDANSETRON HCL 4 MG/2ML IJ SOLN
4.0000 mg | Freq: Once | INTRAMUSCULAR | Status: AC
  Administered 2016-07-27: 4 mg via INTRAVENOUS

## 2016-07-27 NOTE — ED Notes (Signed)
"  feel a little better", updated, pending CT (next), no changes, alert, NAD, calm.

## 2016-07-27 NOTE — ED Triage Notes (Signed)
N/v/d for 5 days

## 2016-07-27 NOTE — ED Notes (Signed)
Given PO contrast, attempting sips, family at [facility], still nauseated, pepcid infusing.

## 2016-07-27 NOTE — ED Notes (Signed)
Tolerating PO contrast, NAD, calm, family at [facility]

## 2016-07-27 NOTE — ED Provider Notes (Signed)
[facility] DEPT Provider Note   CSN: [id number] Arrival date & time: 07/27/16  2051  By signing my name below, I, Gwenlyn Fudge, attest that this documentation has been prepared under the direction and in the presence of Julianne Rice, MD. Electronically Signed: Gwenlyn Fudge, ED Scribe. 07/27/16. 10:18 PM.  First MD Initiated Contact with Patient 07/27/16 2144    History   Chief Complaint Chief Complaint  Patient presents with  . Emesis   The history is provided by the patient and a relative. No language interpreter was used.    HPI Comments: Jordan [name] is a 65 y.o. female with PMHx of GERD who presents to the Emergency Department complaining of gradual onset, episodic vomiting onset 5 days. Pt reports associated decreased appetite, heart burn, abdominal pain, nausea and diarrhea onset 5 days. Daughter states 4 days ago, pt threw up multiple times (>4x). Daughter states that the pt's vomit last night "looked and smelled like poop". Pt has had diarrhea that was just pure water.  Pt reports abdominal pain eases off after vomiting or having diarrhea and states the pain does not radiate to her back. Pt was given GI Cocktail and Zofran at urgent care that provided immediate relief, but did not last. Pt states she had her Gallbladder removed in 1977. Daughter states the pt has been drinking "straight apple cider" for a month. Pt denies fever.  Past Medical History:  Diagnosis Date  . Cellulitis and abscess of finger, unspecified 401.9  . Cellulitis and abscess of foot, except toes   . Depressive disorder, not elsewhere classified   . GERD (gastroesophageal reflux disease)   . Gout, unspecified   . Hyperglycemia 05/08/2011  . Hyperlipidemia    borderline- not on meds  . Insomnia   . Leg edema    chronic  . Obesity, unspecified   . Osteoarthritis   . Renal insufficiency, mild 05/08/2011    Patient Active Problem List   Diagnosis Date Noted  . Small bowel obstruction ([place])  07/28/2016  . Left wrist pain 01/15/2016  . Benign paroxysmal positional vertigo 07/25/2015  . De Quervain's tenosynovitis, left 03/21/2015  . Tendonitis of wrist, left 03/03/2015  . Routine general medical examination at a health care facility 07/06/2012  . GERD (gastroesophageal reflux disease) 01/03/2012  . Hyperglycemia 05/08/2011  . General medical examination 05/06/2011  . Gout 02/21/2010  . Degenerative disc disease, lumbar 07/11/2009  . OSTEOARTHRITIS 02/19/2009  . Hyperlipidemia 01/16/2009  . Obesity 02/17/2008  . Depression 09/04/2007  . Essential hypertension 09/04/2007    Past Surgical History:  Procedure Laterality Date  . ABDOMINAL HYSTERECTOMY  2002   infection- no history of cancer  . APPENDECTOMY  2002  . CHOLECYSTECTOMY  1977  . ECTOPIC PREGNANCY SURGERY  1971  . right shoulder rotator cuff repair  06-07-10  . TOTAL HIP ARTHROPLASTY Left 1998    OB History    No data available       Home Medications    Prior to Admission medications   Medication Sig Start Date End Date Taking? Authorizing Provider  allopurinol (ZYLOPRIM) 300 MG tablet Take 150 mg by mouth 2 (two) times daily.   Yes Historical Provider, MD  aspirin EC 81 MG tablet Take 81 mg by mouth daily.   Yes Historical Provider, MD  DULoxetine (CYMBALTA) 60 MG capsule Take 60 mg by mouth daily.   Yes Historical Provider, MD  furosemide (LASIX) 40 MG tablet Take 40 mg by mouth daily as needed for edema.  Yes Historical Provider, MD  Multiple Vitamin (MULTIVITAMIN WITH MINERALS) TABS tablet Take 1 tablet by mouth daily.   Yes Historical Provider, MD  ondansetron (ZOFRAN) 4 MG tablet Take 4 mg by mouth every 8 (eight) hours as needed for nausea or vomiting.   Yes Historical Provider, MD  pantoprazole (PROTONIX) 40 MG tablet Take 40 mg by mouth daily.   Yes Historical Provider, MD  potassium chloride SA (K-DUR,KLOR-CON) 20 MEQ tablet Take 20 mEq by mouth every other day.   Yes Historical Provider, MD    sucralfate (CARAFATE) 1 g tablet Take 1 g by mouth every 4 (four) hours.   Yes Historical Provider, MD  triamterene-hydrochlorothiazide (DYAZIDE) 37.5-25 MG capsule Take 1 capsule by mouth every other day.   Yes Historical Provider, MD    Family History Family History  Problem Relation Age of Onset  . Diabetes Mother   . Cancer Mother     colon/ pancreatic  . Colon cancer Mother 49  . Hypertension Father   . Alcohol abuse Father   . Cirrhosis Father   . Other Sister     Pyeoderma gangrenosis  . Obesity Daughter   . Diabetes Sister     type 2  . Heart attack Brother   . Cirrhosis Sister     had NASH, died heart failure    Social History Social History  Substance Use Topics  . Smoking status: Former Smoker    Packs/day: 0.25    Types: Cigarettes    Quit date: 12/26/2014  . Smokeless tobacco: Never Used  . Alcohol use 0.0 oz/week     Comment: rare     Allergies   Review of patient's allergies indicates no known allergies.   Review of Systems Review of Systems  Constitutional: Positive for activity change, appetite change and fatigue. Negative for chills and fever.  Respiratory: Negative for shortness of breath.   Cardiovascular: Negative for chest pain.  Gastrointestinal: Positive for abdominal pain, diarrhea, nausea and vomiting. Negative for abdominal distention, anal bleeding, blood in stool and constipation.       + Heart burn  Genitourinary: Negative for dysuria, flank pain, frequency and hematuria.  Musculoskeletal: Negative for back pain, myalgias and neck pain.  Skin: Negative for rash and wound.  Neurological: Positive for weakness (generalized). Negative for dizziness, light-headedness, numbness and headaches.  All other systems reviewed and are negative.  Physical Exam Updated Vital Signs BP (!) 142/75 (BP Location: Left Arm)   Pulse 78   Temp 98.6 F (37 C) (Oral)   Resp 18   Ht 5\' 4"  (1.626 m)   Wt 276 lb (125.2 kg)   SpO2 100%   BMI 47.38  kg/m   Physical Exam  Constitutional: She is oriented to person, place, and time. She appears well-developed and well-nourished. No distress.  HENT:  Head: Normocephalic and atraumatic.  Mouth/Throat: Oropharynx is clear and moist.  Eyes: EOM are normal. Pupils are equal, round, and reactive to light.  Neck: Normal range of motion. Neck supple.  Cardiovascular: Normal rate and regular rhythm.   Pulmonary/Chest: Effort normal and breath sounds normal.  Abdominal: Soft. There is tenderness (tenderness to palpation in the epigastric and left upper quadrants.). There is no rebound and no guarding.  Musculoskeletal: Normal range of motion. She exhibits no edema, tenderness (no CVA tenderness) or deformity.  Neurological: She is alert and oriented to person, place, and time.  Moves all extremities without deficit. Sensation is fully intact.  Skin: Skin is warm  and dry. Capillary refill takes less than 2 seconds. No rash noted. She is not diaphoretic. No erythema.  Psychiatric: She has a normal mood and affect. Her behavior is normal.  Nursing note and vitals reviewed.    ED Treatments / Results  DIAGNOSTIC STUDIES: Oxygen Saturation is 95% on RA, adequate by my interpretation.    COORDINATION OF CARE: 9:45 PM Discussed treatment plan with pt at bedside which includes CT Abdomen Pelvis W Contrast, Morphine Injection and lab work and pt agreed to plan.  Labs (all labs ordered are listed, but only abnormal results are displayed) Labs Reviewed  CBC WITH DIFFERENTIAL/PLATELET - Abnormal; Notable for the following:       Result Value   RBC 5.23 (*)    Hemoglobin 16.4 (*)    HCT 46.4 (*)    All other components within normal limits  COMPREHENSIVE METABOLIC PANEL - Abnormal; Notable for the following:    Sodium 131 (*)    Potassium 3.4 (*)    Chloride 93 (*)    Glucose, Bld 161 (*)    BUN 50 (*)    Creatinine, Ser 2.04 (*)    GFR calc non Af Amer 24 (*)    GFR calc Af Amer 28 (*)     All other components within normal limits  BASIC METABOLIC PANEL - Abnormal; Notable for the following:    Potassium 3.2 (*)    Glucose, Bld 141 (*)    BUN 44 (*)    Creatinine, Ser 1.42 (*)    Calcium 8.0 (*)    GFR calc non Af Amer 38 (*)    GFR calc Af Amer 44 (*)    All other components within normal limits  BASIC METABOLIC PANEL - Abnormal; Notable for the following:    Glucose, Bld 108 (*)    All other components within normal limits  LIPASE, BLOOD  CBC    EKG  EKG Interpretation  Date/Time:  Saturday July 27 2016 22:23:35 EDT Ventricular Rate:  85 PR Interval:    QRS Duration: 97 QT Interval:  389 QTC Calculation: 463 R Axis:   -32 Text Interpretation:  Sinus rhythm Probable left atrial enlargement Abnormal R-wave progression, early transition Left ventricular hypertrophy Confirmed by Lita Mains  MD, Asante Ritacco ([id number]) on 07/27/2016 11:08:00 PM Also confirmed by Lita Mains  MD, Elaina Cara ([id number]), editor [place], Joelene Millin [phone])  on 07/28/2016 11:53:38 AM       Radiology Dg Abd 2 Views  Result Date: 07/29/2016 CLINICAL DATA:  Small-bowel obstruction . EXAM: ABDOMEN - 2 VIEW COMPARISON:  07/28/2016.  CT 07/27/2016 . FINDINGS: NG tube noted coiled in the stomach. Interim improvement of bowel distention. Scattered air-fluid levels noted throughout the nondilated colon. No free air noted. Degenerative changes lumbar spinal scoliosis concave right. Left hip replacement. IMPRESSION: 1. NG tube noted coiled stomach. 2. Interim resolution of small bowel distention. Scattered air-fluid levels noted in nondilated colon. No free air. Electronically Signed   By: Marcello Moores  Register   On: 07/29/2016 07:34   Dg Abd Portable 1v  Result Date: 07/28/2016 CLINICAL DATA:  NG tube placement.  Nausea and vomiting. EXAM: PORTABLE ABDOMEN - 1 VIEW COMPARISON:  CT yesterday. FINDINGS: Tip and side port of the enteric tube below the diaphragm in the stomach. Persistently dilated small bowel loops in the abdomen  measuring up to 5.4 cm. No evidence of free air on the supine view. IMPRESSION: Tip and side port of the enteric tube below the diaphragm in the stomach. Electronically  Signed   By: Jeb Levering M.D.   On: 07/28/2016 20:40    Procedures Procedures (including critical care time)  Medications Ordered in ED Medications  Benzocaine (HURRCAINE) 20 % mouth spray (not administered)  dextrose 5 % and 0.45 % NaCl with KCl 30 mEq/L infusion ( Intravenous New Bag/Given 07/30/16 1202)  HYDROmorphone (DILAUDID) injection 1-2 mg (1 mg Intravenous Given 07/29/16 1056)  ondansetron (ZOFRAN-ODT) disintegrating tablet 4 mg ( Oral See Alternative 07/30/16 1026)    Or  ondansetron (ZOFRAN) injection 4 mg (4 mg Intravenous Given 07/30/16 1026)  famotidine (PEPCID) IVPB 20 mg premix (20 mg Intravenous Given 07/30/16 1026)  phenol (CHLORASEPTIC) mouth spray 1 spray (1 spray Mouth/Throat Given 07/28/16 0516)  promethazine (PHENERGAN) injection 12.5 mg (12.5 mg Intravenous Given 07/28/16 2009)  diphenhydrAMINE (BENADRYL) injection 12.5 mg (12.5 mg Intravenous Given 07/29/16 2344)  enoxaparin (LOVENOX) injection 60 mg (60 mg Subcutaneous Given 07/30/16 1410)  ondansetron (ZOFRAN) injection 4 mg (4 mg Intravenous Given 07/27/16 2141)  sodium chloride 0.9 % bolus 1,000 mL (0 mLs Intravenous Stopped 07/27/16 2216)  morphine 4 MG/ML injection 4 mg (4 mg Intravenous Given 07/27/16 2216)  famotidine (PEPCID) IVPB 20 mg premix (0 mg Intravenous Stopped 07/27/16 2249)  ondansetron (ZOFRAN) injection 4 mg (4 mg Intravenous Given 07/27/16 2251)  sodium chloride 0.9 % bolus 1,000 mL (0 mLs Intravenous Stopped 07/28/16 0149)  potassium chloride 10 mEq in 100 mL IVPB (0 mEq Intravenous Stopped 07/28/16 0305)  sodium chloride 0.9 % bolus 3,756 mL (756 mLs Intravenous New Bag/Given 07/28/16 0304)  fentaNYL (SUBLIMAZE) injection 50 mcg (50 mcg Intravenous Given 07/28/16 0132)  ondansetron (ZOFRAN) injection 4 mg (4 mg Intravenous Given 07/28/16 0133)  lidocaine  (XYLOCAINE) 2 % jelly (1 application Nasal Given 07/28/16 0209)  oxymetazoline (AFRIN) 0.05 % nasal spray (2 sprays Nasal Given 07/28/16 0209)  Benzocaine (HURRCAINE) 20 % mouth spray (2 application  Given [id number])     Initial Impression / Assessment and Plan / ED Course  I have reviewed the triage vital signs and the nursing notes.  Pertinent labs & imaging results that were available during my care of the patient were reviewed by me and considered in my medical decision making (see chart for details).  Clinical Course    Signed out to oncoming emergency physician pending CT which was done without IV contrast due to the patient's new onset renal insufficiency.  Final Clinical Impressions(s) / ED Diagnoses   Final diagnoses:  Small bowel obstruction [facility])    New Prescriptions Current Discharge Medication List     I personally performed the services described in this documentation, which was scribed in my presence. The recorded information has been reviewed and is accurate.        Julianne Rice, MD 07/30/16 [phone]

## 2016-07-27 NOTE — ED Notes (Signed)
MD at bedside. 

## 2016-07-27 NOTE — ED Notes (Signed)
Pt alert, NAD, calm, interactive, resps e/u, speaking in clear complete sentences, c/o upper abd pain, also nvd, subjective fever/chills, also heartburn and general weakness,(denies: cough, congestion, sob, dizziness, known bleeding, back pain, urinary sx, or other sx). Family at [facility]. VSS.

## 2016-07-28 ENCOUNTER — Inpatient Hospital Stay (HOSPITAL_COMMUNITY): Payer: Medicare Other

## 2016-07-28 ENCOUNTER — Encounter (HOSPITAL_COMMUNITY): Payer: Self-pay | Admitting: *Deleted

## 2016-07-28 DIAGNOSIS — R1010 Upper abdominal pain, unspecified: Secondary | ICD-10-CM | POA: Diagnosis not present

## 2016-07-28 DIAGNOSIS — N289 Disorder of kidney and ureter, unspecified: Secondary | ICD-10-CM | POA: Diagnosis present

## 2016-07-28 DIAGNOSIS — F329 Major depressive disorder, single episode, unspecified: Secondary | ICD-10-CM | POA: Diagnosis present

## 2016-07-28 DIAGNOSIS — Z90710 Acquired absence of both cervix and uterus: Secondary | ICD-10-CM | POA: Diagnosis not present

## 2016-07-28 DIAGNOSIS — Z833 Family history of diabetes mellitus: Secondary | ICD-10-CM | POA: Diagnosis not present

## 2016-07-28 DIAGNOSIS — M109 Gout, unspecified: Secondary | ICD-10-CM | POA: Diagnosis present

## 2016-07-28 DIAGNOSIS — Z6841 Body Mass Index (BMI) 40.0 and over, adult: Secondary | ICD-10-CM | POA: Diagnosis not present

## 2016-07-28 DIAGNOSIS — N200 Calculus of kidney: Secondary | ICD-10-CM | POA: Diagnosis present

## 2016-07-28 DIAGNOSIS — Z4682 Encounter for fitting and adjustment of non-vascular catheter: Secondary | ICD-10-CM | POA: Diagnosis not present

## 2016-07-28 DIAGNOSIS — Z8249 Family history of ischemic heart disease and other diseases of the circulatory system: Secondary | ICD-10-CM | POA: Diagnosis not present

## 2016-07-28 DIAGNOSIS — K5660 Unspecified intestinal obstruction: Secondary | ICD-10-CM | POA: Diagnosis not present

## 2016-07-28 DIAGNOSIS — K219 Gastro-esophageal reflux disease without esophagitis: Secondary | ICD-10-CM | POA: Diagnosis present

## 2016-07-28 DIAGNOSIS — K56609 Unspecified intestinal obstruction, unspecified as to partial versus complete obstruction: Secondary | ICD-10-CM | POA: Diagnosis present

## 2016-07-28 DIAGNOSIS — E860 Dehydration: Secondary | ICD-10-CM | POA: Diagnosis present

## 2016-07-28 DIAGNOSIS — R1084 Generalized abdominal pain: Secondary | ICD-10-CM | POA: Diagnosis not present

## 2016-07-28 DIAGNOSIS — Z87891 Personal history of nicotine dependence: Secondary | ICD-10-CM | POA: Diagnosis not present

## 2016-07-28 DIAGNOSIS — E876 Hypokalemia: Secondary | ICD-10-CM | POA: Diagnosis present

## 2016-07-28 DIAGNOSIS — Z7982 Long term (current) use of aspirin: Secondary | ICD-10-CM | POA: Diagnosis not present

## 2016-07-28 DIAGNOSIS — Z800 Family history of malignant neoplasm of digestive organs: Secondary | ICD-10-CM | POA: Diagnosis not present

## 2016-07-28 DIAGNOSIS — R112 Nausea with vomiting, unspecified: Secondary | ICD-10-CM | POA: Diagnosis not present

## 2016-07-28 DIAGNOSIS — K565 Intestinal adhesions [bands] with obstruction (postprocedural) (postinfection): Secondary | ICD-10-CM | POA: Diagnosis present

## 2016-07-28 DIAGNOSIS — Z9049 Acquired absence of other specified parts of digestive tract: Secondary | ICD-10-CM | POA: Diagnosis not present

## 2016-07-28 DIAGNOSIS — R7989 Other specified abnormal findings of blood chemistry: Secondary | ICD-10-CM | POA: Diagnosis present

## 2016-07-28 DIAGNOSIS — Z96642 Presence of left artificial hip joint: Secondary | ICD-10-CM | POA: Diagnosis present

## 2016-07-28 DIAGNOSIS — E785 Hyperlipidemia, unspecified: Secondary | ICD-10-CM | POA: Diagnosis present

## 2016-07-28 DIAGNOSIS — Z79899 Other long term (current) drug therapy: Secondary | ICD-10-CM | POA: Diagnosis not present

## 2016-07-28 LAB — BASIC METABOLIC PANEL
ANION GAP: 10 (ref 5–15)
BUN: 44 mg/dL — ABNORMAL HIGH (ref 6–20)
CO2: 23 mmol/L (ref 22–32)
Calcium: 8 mg/dL — ABNORMAL LOW (ref 8.9–10.3)
Chloride: 102 mmol/L (ref 101–111)
Creatinine, Ser: 1.42 mg/dL — ABNORMAL HIGH (ref 0.44–1.00)
GFR calc Af Amer: 44 mL/min — ABNORMAL LOW (ref >60)
GFR, EST NON AFRICAN AMERICAN: 38 mL/min — AB (ref >60)
GLUCOSE: 141 mg/dL — AB (ref 65–99)
POTASSIUM: 3.2 mmol/L — AB (ref 3.5–5.1)
Sodium: 135 mmol/L (ref 135–145)

## 2016-07-28 LAB — CBC
HEMATOCRIT: 40.3 % (ref 36.0–46.0)
HEMOGLOBIN: 13.6 g/dL (ref 12.0–15.0)
MCH: 30.6 pg (ref 26.0–34.0)
MCHC: 33.7 g/dL (ref 30.0–36.0)
MCV: 90.6 fL (ref 78.0–100.0)
PLATELETS: 220 10*3/uL (ref 150–400)
RBC: 4.45 MIL/uL (ref 3.87–5.11)
RDW: 13.5 % (ref 11.5–15.5)
WBC: 7.8 10*3/uL (ref 4.0–10.5)

## 2016-07-28 MED ORDER — ONDANSETRON HCL 4 MG/2ML IJ SOLN
4.0000 mg | Freq: Four times a day (QID) | INTRAMUSCULAR | Status: DC | PRN
  Administered 2016-07-28 – 2016-07-30 (×4): 4 mg via INTRAVENOUS
  Filled 2016-07-28 (×4): qty 2

## 2016-07-28 MED ORDER — KCL IN DEXTROSE-NACL 30-5-0.45 MEQ/L-%-% IV SOLN
INTRAVENOUS | Status: DC
  Administered 2016-07-28 – 2016-07-29 (×3): via INTRAVENOUS
  Administered 2016-07-29: 1000 mL via INTRAVENOUS
  Administered 2016-07-30: 12:00:00 via INTRAVENOUS
  Administered 2016-07-30: 1000 mL via INTRAVENOUS
  Filled 2016-07-28 (×8): qty 1000

## 2016-07-28 MED ORDER — ONDANSETRON 4 MG PO TBDP: 4.0000 mg | ORAL_TABLET | Freq: Four times a day (QID) | ORAL | Status: DC | PRN

## 2016-07-28 MED ORDER — BENZOCAINE 20 % MT AERO
INHALATION_SPRAY | OROMUCOSAL | Status: AC
  Administered 2016-07-28: 2
  Filled 2016-07-28: qty 57

## 2016-07-28 MED ORDER — BENZOCAINE 20 % MT AERO: INHALATION_SPRAY | Freq: Once | OROMUCOSAL | Status: DC

## 2016-07-28 MED ORDER — FAMOTIDINE IN NACL 20-0.9 MG/50ML-% IV SOLN
20.0000 mg | Freq: Two times a day (BID) | INTRAVENOUS | Status: DC
  Administered 2016-07-28 – 2016-07-30 (×6): 20 mg via INTRAVENOUS
  Filled 2016-07-28 (×7): qty 50

## 2016-07-28 MED ORDER — LIDOCAINE HCL 2 % EX GEL
CUTANEOUS | Status: AC
  Administered 2016-07-28: 1 via NASAL
  Filled 2016-07-28: qty 20

## 2016-07-28 MED ORDER — SODIUM CHLORIDE 0.9 % IV BOLUS (SEPSIS)
1000.0000 mL | Freq: Once | INTRAVENOUS | Status: AC
  Administered 2016-07-28: 1000 mL via INTRAVENOUS

## 2016-07-28 MED ORDER — ONDANSETRON HCL 4 MG/2ML IJ SOLN
4.0000 mg | Freq: Once | INTRAMUSCULAR | Status: AC
  Administered 2016-07-28: 4 mg via INTRAVENOUS
  Filled 2016-07-28: qty 2

## 2016-07-28 MED ORDER — SODIUM CHLORIDE 0.9 % IV BOLUS (SEPSIS)
30.0000 mL/kg | Freq: Once | INTRAVENOUS | Status: AC
  Administered 2016-07-28: 756 mL via INTRAVENOUS

## 2016-07-28 MED ORDER — OXYMETAZOLINE HCL 0.05 % NA SOLN
NASAL | Status: AC
  Administered 2016-07-28: 2 via NASAL
  Filled 2016-07-28: qty 15

## 2016-07-28 MED ORDER — FENTANYL CITRATE (PF) 100 MCG/2ML IJ SOLN
50.0000 ug | Freq: Once | INTRAMUSCULAR | Status: AC
  Administered 2016-07-28: 50 ug via INTRAVENOUS
  Filled 2016-07-28: qty 2

## 2016-07-28 MED ORDER — PROMETHAZINE HCL 25 MG/ML IJ SOLN
12.5000 mg | INTRAMUSCULAR | Status: DC | PRN
  Administered 2016-07-28: 12.5 mg via INTRAVENOUS
  Filled 2016-07-28: qty 1

## 2016-07-28 MED ORDER — HYDROMORPHONE HCL 1 MG/ML IJ SOLN
1.0000 mg | INTRAMUSCULAR | Status: DC | PRN
  Administered 2016-07-28 – 2016-07-29 (×2): 1 mg via INTRAVENOUS
  Filled 2016-07-28 (×2): qty 1

## 2016-07-28 MED ORDER — PHENOL 1.4 % MT LIQD
1.0000 | OROMUCOSAL | Status: DC | PRN
Start: 2016-07-28 — End: 2016-07-31
  Administered 2016-07-28: 1 via OROMUCOSAL
  Filled 2016-07-28: qty 177

## 2016-07-28 MED ORDER — POTASSIUM CHLORIDE 10 MEQ/100ML IV SOLN
10.0000 meq | INTRAVENOUS | Status: AC
  Administered 2016-07-28 (×2): 10 meq via INTRAVENOUS
  Filled 2016-07-28 (×2): qty 100

## 2016-07-28 NOTE — ED Notes (Signed)
To CT

## 2016-07-28 NOTE — ED Notes (Signed)
Pt arrived from [facility] via [facility] for "small bowel obstruction management"---- for surgery consult.

## 2016-07-28 NOTE — ED Notes (Signed)
Back from CT, alert, NAD, calm, no changes 

## 2016-07-28 NOTE — ED Notes (Signed)
Report given to Floor Unit RN.

## 2016-07-28 NOTE — H&P (Signed)
Jordan [name] is an 65 y.o. female.    General Surgery [facility]  Chief Complaint: abdominal pain, nausea, emesis, diarrhea  HPI: Patient is a 65 year old female who works at [employer] and presents with a 5 day history of abdominal pain, nausea, vomiting, and watery diarrhea. Patient has had similar episodes over the past several months which have been less severe. Patient and her family note foul-smelling emesis. Patient presented to the emergency room for assessment. She was noted to be dehydrated with an elevated creatinine and mild hypokalemia. Patient received several liters of fluid resuscitation as well as potassium supplements. She underwent CT scan of the abdomen and pelvis which shows dilated proximal small bowel and stomach consistent with a transition point in the mid small bowel and small bowel obstruction. Nasogastric tube was placed. Patient was then transferred to [facility] for admission and surgical evaluation.  Patient has a complex past surgical history with multiple biliary procedures for apparent cholelithiasis and choledocholithiasis. Patient has also undergone total hysterectomy, and appendectomy.  Patient is now admitted to the surgical service with small bowel obstruction.  Past Medical History:  Diagnosis Date  . Cellulitis and abscess of finger, unspecified 401.9  . Cellulitis and abscess of foot, except toes   . Depressive disorder, not elsewhere classified   . GERD (gastroesophageal reflux disease)   . Gout, unspecified   . Hyperglycemia 05/08/2011  . Hyperlipidemia    borderline- not on meds  . Insomnia   . Leg edema    chronic  . Obesity, unspecified   . Osteoarthritis   . Renal insufficiency, mild 05/08/2011    Past Surgical History:  Procedure Laterality Date  . ABDOMINAL HYSTERECTOMY  2002   infection- no history of cancer  . APPENDECTOMY  2002  . CHOLECYSTECTOMY  1977  . ECTOPIC PREGNANCY SURGERY  1971   . right shoulder rotator cuff repair  06-07-10  . TOTAL HIP ARTHROPLASTY Left 1998    Family History  Problem Relation Age of Onset  . Diabetes Mother   . Cancer Mother     colon/ pancreatic  . Colon cancer Mother 53  . Hypertension Father   . Alcohol abuse Father   . Cirrhosis Father   . Other Sister     Pyeoderma gangrenosis  . Obesity Daughter   . Diabetes Sister     type 2  . Heart attack Brother   . Cirrhosis Sister     had NASH, died heart failure   Social History:  reports that she quit smoking about 19 months ago. Her smoking use included Cigarettes. She smoked 0.25 packs per day. She has never used smokeless tobacco. She reports that she drinks alcohol. She reports that she does not use drugs.  Allergies: No Known Allergies  Medications Prior to Admission  Medication Sig Dispense Refill  . allopurinol (ZYLOPRIM) 300 MG tablet Take 150 mg by mouth 2 (two) times daily.    Marland Kitchen aspirin EC 81 MG tablet Take 81 mg by mouth daily.    . DULoxetine (CYMBALTA) 60 MG capsule Take 60 mg by mouth daily.    . furosemide (LASIX) 40 MG tablet Take 40 mg by mouth daily as needed for edema.    . Multiple Vitamin (MULTIVITAMIN WITH MINERALS) TABS tablet Take 1 tablet by mouth daily.    . ondansetron (ZOFRAN) 4 MG tablet Take 4 mg by mouth every 8 (eight) hours as needed for nausea or vomiting.    Marland Kitchen  pantoprazole (PROTONIX) 40 MG tablet Take 40 mg by mouth daily.    . potassium chloride SA (K-DUR,KLOR-CON) 20 MEQ tablet Take 20 mEq by mouth every other day.    . sucralfate (CARAFATE) 1 g tablet Take 1 g by mouth every 4 (four) hours.    . triamterene-hydrochlorothiazide (DYAZIDE) 37.5-25 MG capsule Take 1 capsule by mouth every other day.      Results for orders placed or performed during the hospital encounter of 07/27/16 (from the past 48 hour(s))  CBC with Differential     Status: Abnormal   Collection Time: 07/27/16  9:40 PM  Result Value Ref Range   WBC 10.1 4.0 - 10.5 K/uL   RBC  5.23 (H) 3.87 - 5.11 MIL/uL   Hemoglobin 16.4 (H) 12.0 - 15.0 g/dL   HCT 46.4 (H) 36.0 - 46.0 %   MCV 88.7 78.0 - 100.0 fL   MCH 31.4 26.0 - 34.0 pg   MCHC 35.3 30.0 - 36.0 g/dL   RDW 13.4 11.5 - 15.5 %   Platelets 272 150 - 400 K/uL   Neutrophils Relative % 69 %   Neutro Abs 7.0 1.7 - 7.7 K/uL   Lymphocytes Relative 24 %   Lymphs Abs 2.4 0.7 - 4.0 K/uL   Monocytes Relative 6 %   Monocytes Absolute 0.6 0.1 - 1.0 K/uL   Eosinophils Relative 1 %   Eosinophils Absolute 0.1 0.0 - 0.7 K/uL   Basophils Relative 0 %   Basophils Absolute 0.0 0.0 - 0.1 K/uL  Comprehensive metabolic panel     Status: Abnormal   Collection Time: 07/27/16  9:40 PM  Result Value Ref Range   Sodium 131 (L) 135 - 145 mmol/L   Potassium 3.4 (L) 3.5 - 5.1 mmol/L   Chloride 93 (L) 101 - 111 mmol/L   CO2 24 22 - 32 mmol/L   Glucose, Bld 161 (H) 65 - 99 mg/dL   BUN 50 (H) 6 - 20 mg/dL   Creatinine, Ser 2.04 (H) 0.44 - 1.00 mg/dL   Calcium 9.5 8.9 - 10.3 mg/dL   Total Protein 8.0 6.5 - 8.1 g/dL   Albumin 4.8 3.5 - 5.0 g/dL   AST 27 15 - 41 U/L   ALT 36 14 - 54 U/L   Alkaline Phosphatase 113 38 - 126 U/L   Total Bilirubin 0.9 0.3 - 1.2 mg/dL   GFR calc non Af Amer 24 (L) >60 mL/min   GFR calc Af Amer 28 (L) >60 mL/min    Comment: (NOTE) The eGFR has been calculated using the CKD EPI equation. This calculation has not been validated in all clinical situations. eGFR's persistently <60 mL/min signify possible Chronic Kidney Disease.    Anion gap 14 5 - 15  Lipase, blood     Status: None   Collection Time: 07/27/16  9:40 PM  Result Value Ref Range   Lipase 22 11 - 51 U/L  CBC     Status: None   Collection Time: 07/28/16  6:58 AM  Result Value Ref Range   WBC 7.8 4.0 - 10.5 K/uL   RBC 4.45 3.87 - 5.11 MIL/uL   Hemoglobin 13.6 12.0 - 15.0 g/dL   HCT 40.3 36.0 - 46.0 %   MCV 90.6 78.0 - 100.0 fL   MCH 30.6 26.0 - 34.0 pg   MCHC 33.7 30.0 - 36.0 g/dL   RDW 13.5 11.5 - 15.5 %   Platelets 220 150 - 400  K/uL   Ct Abdomen  Pelvis Wo Contrast  Result Date: 07/28/2016 CLINICAL DATA:  Upper abdominal pain. Nausea, vomiting and diarrhea for 5 days. EXAM: CT ABDOMEN AND PELVIS WITHOUT CONTRAST TECHNIQUE: Multidetector CT imaging of the abdomen and pelvis was performed following the standard protocol without IV contrast. COMPARISON:  None. FINDINGS: Lower chest: Linear atelectasis or scarring in both lower lobes. No pleural fluid. Coronary artery calcifications are seen. Liver: No focal abnormality allowing for lack contrast. Hepatobiliary: Postcholecystectomy. Prominence of common bile duct measuring 11 mm distally, likely sequela of prior cholecystectomy. Pancreas: No ductal dilatation or inflammation. Spleen: Normal. Adrenal glands: No nodule. Kidneys: Prominence of the right renal pelvis is likely extrarenal pelvis configuration. There is no perinephric edema or renal obstruction. There is a punctate nonobstructing stone in the lower left kidney. Stomach/Bowel: Marked distention of stomach and proximal small bowel with ingested contents. There is transition from dilated to nondilated in the central mid abdomen the images 74 series 2. The more distal small bowel loops are decompressed. Minimal mesenteric edema, no pneumatosis. The colon is decompressed, and there is diverticulosis of the sigmoid colon. No evidence of acute diverticulitis. The appendix is not visualized, surgically absent per report. Vascular/Lymphatic: No retroperitoneal adenopathy. Abdominal aorta is normal in caliber. Atherosclerosis of the abdominal aorta and its branches, no aneurysm. Reproductive: The uterus is surgically absent. No adnexal mass. Streak artifact from left hip arthroplasty partially obscures evaluation. Bladder: Physiologically distended without evidence of wall thickening. Other: No free air, free fluid, or intra-abdominal fluid collection. Musculoskeletal: There are no acute or suspicious osseous abnormalities. Left hip  arthroplasty with streak artifact. Degenerative change in the thoracic and lumbar spine, minimal anterolisthesis of L4 on L5 appears degenerative. Chronic vertebral body height loss in the lower thoracic spine. IMPRESSION: 1. Small-bowel obstruction with transition point in the central mid abdomen, likely due to adhesions. 2. Chronic findings include nonobstructing left nephrolithiasis, sigmoid colonic diverticulosis and abdominal aortic atherosclerosis without aneurysm. Postcholecystectomy with extrahepatic biliary prominence, likely postsurgical in etiology. Electronically Signed   By: Jeb Levering M.D.   On: 07/28/2016 00:51    Review of Systems  Constitutional: Negative for chills and fever.  HENT: Negative.   Eyes: Negative.   Respiratory: Negative.   Cardiovascular: Negative.   Gastrointestinal: Positive for abdominal pain (central), diarrhea, nausea and vomiting. Negative for blood in stool, constipation and melena.  Genitourinary: Negative.   Musculoskeletal: Negative.   Skin: Negative.   Neurological: Negative.   Endo/Heme/Allergies: Negative.   Psychiatric/Behavioral: Negative.     Blood pressure (!) 144/70, pulse 80, temperature 98.3 F (36.8 C), temperature source Oral, resp. rate 16, height 5' 4"  (1.626 m), weight 125.2 kg (276 lb), SpO2 98 %. Physical Exam  Constitutional: She is oriented to person, place, and time. She appears well-developed and well-nourished. No distress.  HENT:  Head: Normocephalic and atraumatic.  Right Ear: External ear normal.  Left Ear: External ear normal.  Mouth/Throat: No oropharyngeal exudate.  Eyes: Conjunctivae are normal. Pupils are equal, round, and reactive to light. No scleral icterus.  Neck: Normal range of motion. Neck supple. No tracheal deviation present. No thyromegaly present.  Cardiovascular: Normal rate, regular rhythm and normal heart sounds.   No murmur heard. Respiratory: Effort normal and breath sounds normal. No  respiratory distress. She has no wheezes.  GI: Soft. Bowel sounds are normal. She exhibits no distension and no mass. There is tenderness (epigastric and periumbilical). There is no rebound and no guarding.  Musculoskeletal: Normal range of motion. She exhibits no  edema, tenderness or deformity.  Neurological: She is alert and oriented to person, place, and time.  Skin: Skin is warm and dry. She is not diaphoretic.  Psychiatric: She has a normal mood and affect. Her behavior is normal.     Assessment/Plan Small bowel obstruction likely secondary to adhesions  NG decompression, IV hydration, NPO  Ambulate in halls  Hgb improved with hydration  BMET pending to check creatinine and potassium  I discussed the above findings with the patient and her family. I explained that most small bowel obstructions will resolve with nonoperative measures over 24-48 hours. However we did discuss the possible need for operative intervention should her obstruction fail to resolve or her clinical condition deteriorate. She understands and agrees with plans for management.  Earnstine Regal, MD, [facility] Office: [place], MD 07/28/2016, 7:29 AM

## 2016-07-29 ENCOUNTER — Inpatient Hospital Stay (HOSPITAL_COMMUNITY): Payer: Medicare Other

## 2016-07-29 MED ORDER — DIPHENHYDRAMINE HCL 50 MG/ML IJ SOLN
12.5000 mg | Freq: Four times a day (QID) | INTRAMUSCULAR | Status: DC | PRN
  Administered 2016-07-29 – 2016-07-30 (×2): 12.5 mg via INTRAVENOUS
  Filled 2016-07-29 (×2): qty 1

## 2016-07-29 NOTE — Progress Notes (Signed)
[facility] Office:  [phone] General Surgery Progress Note   LOS: 1 day  POD -     Assessment/Plan: 1.  Small bowel obstruction  KUB today appears better  Doing better - will clamp NGT and remove later today or tomorrow  2.  Left nephrolithiasis 3.  Morbid obesity - BMI - 47.5 4.  Elevated creatinine  Creat - 1.42 on 07/28/2016  5.  DVT prophylaxis - on hold, will start lovenox   Active Problems:   Small bowel obstruction (HCC)  Subjective:  Had small BM, passed small amount of flatus.  Daughter, Orell Dobbie, in room with patient.  Objective:   Vitals:   07/29/16 0141 07/29/16 0557  BP: 137/76 (!) 158/94  Pulse: 70 77  Resp: 15 16  Temp: 97.8 F (36.6 C) 98.3 F (36.8 C)     Intake/Output from previous day:  08/06 0701 - 08/07 0700 In: 2832 [I.V.:2422; NG/GT:360; IV Piggyback:50] Out: 2902 [Urine:1801; Emesis/NG output:1101]  Intake/Output this shift:  Total I/O In: 30 [NG/GT:30] Out: 400 [Emesis/NG output:400]   Physical Exam:   General: Obese tan WF who is alert and oriented.    HEENT: Normal. Pupils equal.  Has NGT. Marland Kitchen   Lungs: Clear   Abdomen: Soft.  Non tender.  BS present.   Lab Results:    Recent Labs  07/27/16 2140 07/28/16 0658  WBC 10.1 7.8  HGB 16.4* 13.6  HCT 46.4* 40.3  PLT 272 220    BMET   Recent Labs  07/27/16 2140 07/28/16 0658  NA 131* 135  K 3.4* 3.2*  CL 93* 102  CO2 24 23  GLUCOSE 161* 141*  BUN 50* 44*  CREATININE 2.04* 1.42*  CALCIUM 9.5 8.0*    PT/INR  No results for input(s): LABPROT, INR in the last 72 hours.  ABG  No results for input(s): PHART, HCO3 in the last 72 hours.  Invalid input(s): PCO2, PO2   Studies/Results:  Ct Abdomen Pelvis Wo Contrast  Result Date: 07/28/2016 CLINICAL DATA:  Upper abdominal pain. Nausea, vomiting and diarrhea for 5 days. EXAM: CT ABDOMEN AND PELVIS WITHOUT CONTRAST TECHNIQUE: Multidetector CT imaging of the abdomen and pelvis was performed following the  standard protocol without IV contrast. COMPARISON:  None. FINDINGS: Lower chest: Linear atelectasis or scarring in both lower lobes. No pleural fluid. Coronary artery calcifications are seen. Liver: No focal abnormality allowing for lack contrast. Hepatobiliary: Postcholecystectomy. Prominence of common bile duct measuring 11 mm distally, likely sequela of prior cholecystectomy. Pancreas: No ductal dilatation or inflammation. Spleen: Normal. Adrenal glands: No nodule. Kidneys: Prominence of the right renal pelvis is likely extrarenal pelvis configuration. There is no perinephric edema or renal obstruction. There is a punctate nonobstructing stone in the lower left kidney. Stomach/Bowel: Marked distention of stomach and proximal small bowel with ingested contents. There is transition from dilated to nondilated in the central mid abdomen the images 74 series 2. The more distal small bowel loops are decompressed. Minimal mesenteric edema, no pneumatosis. The colon is decompressed, and there is diverticulosis of the sigmoid colon. No evidence of acute diverticulitis. The appendix is not visualized, surgically absent per report. Vascular/Lymphatic: No retroperitoneal adenopathy. Abdominal aorta is normal in caliber. Atherosclerosis of the abdominal aorta and its branches, no aneurysm. Reproductive: The uterus is surgically absent. No adnexal mass. Streak artifact from left hip arthroplasty partially obscures evaluation. Bladder: Physiologically distended without evidence of wall thickening. Other: No free air, free fluid, or intra-abdominal fluid collection. Musculoskeletal: There are  no acute or suspicious osseous abnormalities. Left hip arthroplasty with streak artifact. Degenerative change in the thoracic and lumbar spine, minimal anterolisthesis of L4 on L5 appears degenerative. Chronic vertebral body height loss in the lower thoracic spine. IMPRESSION: 1. Small-bowel obstruction with transition point in the central  mid abdomen, likely due to adhesions. 2. Chronic findings include nonobstructing left nephrolithiasis, sigmoid colonic diverticulosis and abdominal aortic atherosclerosis without aneurysm. Postcholecystectomy with extrahepatic biliary prominence, likely postsurgical in etiology. Electronically Signed   By: Jeb Levering M.D.   On: 07/28/2016 00:51   Dg Abd 2 Views  Result Date: 07/29/2016 CLINICAL DATA:  Small-bowel obstruction . EXAM: ABDOMEN - 2 VIEW COMPARISON:  07/28/2016.  CT 07/27/2016 . FINDINGS: NG tube noted coiled in the stomach. Interim improvement of bowel distention. Scattered air-fluid levels noted throughout the nondilated colon. No free air noted. Degenerative changes lumbar spinal scoliosis concave right. Left hip replacement. IMPRESSION: 1. NG tube noted coiled stomach. 2. Interim resolution of small bowel distention. Scattered air-fluid levels noted in nondilated colon. No free air. Electronically Signed   By: Marcello Moores  Register   On: 07/29/2016 07:34   Dg Abd Portable 1v  Result Date: 07/28/2016 CLINICAL DATA:  NG tube placement.  Nausea and vomiting. EXAM: PORTABLE ABDOMEN - 1 VIEW COMPARISON:  CT yesterday. FINDINGS: Tip and side port of the enteric tube below the diaphragm in the stomach. Persistently dilated small bowel loops in the abdomen measuring up to 5.4 cm. No evidence of free air on the supine view. IMPRESSION: Tip and side port of the enteric tube below the diaphragm in the stomach. Electronically Signed   By: Jeb Levering M.D.   On: 07/28/2016 20:40     Anti-infectives:   Anti-infectives    None      Alphonsa Overall, MD, FACS Pager: [phone] Surgery Office: [phone] 07/29/2016

## 2016-07-30 LAB — BASIC METABOLIC PANEL
Anion gap: 6 (ref 5–15)
BUN: 15 mg/dL (ref 6–20)
CHLORIDE: 109 mmol/L (ref 101–111)
CO2: 27 mmol/L (ref 22–32)
CREATININE: 0.96 mg/dL (ref 0.44–1.00)
Calcium: 8.9 mg/dL (ref 8.9–10.3)
GFR calc Af Amer: >60 mL/min (ref >60)
GFR calc non Af Amer: >60 mL/min (ref >60)
GLUCOSE: 108 mg/dL — AB (ref 65–99)
POTASSIUM: 4 mmol/L (ref 3.5–5.1)
Sodium: 142 mmol/L (ref 135–145)

## 2016-07-30 MED ORDER — ENOXAPARIN SODIUM 60 MG/0.6ML ~~LOC~~ SOLN
60.0000 mg | SUBCUTANEOUS | Status: DC
Start: 2016-07-30 — End: 2016-07-31
  Administered 2016-07-30: 60 mg via SUBCUTANEOUS
  Filled 2016-07-30: qty 0.6

## 2016-07-30 NOTE — Progress Notes (Signed)
PHARMACY CONSULT: Lovenox for VTE prophylaxis   Wt: 125 kg BMI:  47 Scr:  0.96 CrCl >30 ml/hr  H/H: 13.6/40.3 (8/6) Pltc: 220  A/P:  Due to morbid obesity, begin weight-adjusted lovenox 0.5mg /kg sq q24h  Monitor CBC and renal function, adjust as needed  Peggyann Juba, PharmD, BCPS Pager: [phone] 07/30/2016 9:25 AM

## 2016-07-30 NOTE — Progress Notes (Signed)
Subjective: She's tired of  being in the hospital, she's had multiple bowel movements and ready to progress.  Objective: Vital signs in last 24 hours: Temp:  [97.6 F (36.4 C)-98.1 F (36.7 C)] 98.1 F (36.7 C) (08/08 0557) Pulse Rate:  [74-82] 74 (08/08 0557) Resp:  [16-18] 18 (08/08 0557) BP: (132-139)/(74-80) 136/74 (08/08 0557) SpO2:  [97 %-100 %] 97 % (08/08 0557) Last BM Date: 07/29/16 IV 2795 Urine 850 recorded BM 2 recorded Afebrile, VVS Labs OK potassium is up to 4.0. Intake/Output from previous day: 08/07 0701 - 08/08 0700 In: 2875 [I.V.:2795; NG/GT:30; IV Piggyback:50] Out: 1350 [Urine:850; Emesis/NG output:500] Intake/Output this shift: No intake/output data recorded.  General appearance: alert, cooperative and no distress Resp: clear to auscultation bilaterally GI: Soft positive bowel sounds, positive BM, no distention  Lab Results:   Recent Labs  07/27/16 2140 07/28/16 0658  WBC 10.1 7.8  HGB 16.4* 13.6  HCT 46.4* 40.3  PLT 272 220    BMET  Recent Labs  07/28/16 0658 07/30/16 0517  NA 135 142  K 3.2* 4.0  CL 102 109  CO2 23 27  GLUCOSE 141* 108*  BUN 44* 15  CREATININE 1.42* 0.96  CALCIUM 8.0* 8.9   PT/INR No results for input(s): LABPROT, INR in the last 72 hours.   Recent Labs Lab 07/27/16 2140  AST 27  ALT 36  ALKPHOS 113  BILITOT 0.9  PROT 8.0  ALBUMIN 4.8     Lipase     Component Value Date/Time   LIPASE 22 07/27/2016 2140     Studies/Results: Dg Abd 2 Views  Result Date: 07/29/2016 CLINICAL DATA:  Small-bowel obstruction . EXAM: ABDOMEN - 2 VIEW COMPARISON:  07/28/2016.  CT 07/27/2016 . FINDINGS: NG tube noted coiled in the stomach. Interim improvement of bowel distention. Scattered air-fluid levels noted throughout the nondilated colon. No free air noted. Degenerative changes lumbar spinal scoliosis concave right. Left hip replacement. IMPRESSION: 1. NG tube noted coiled stomach. 2. Interim resolution of small  bowel distention. Scattered air-fluid levels noted in nondilated colon. No free air. Electronically Signed   By: Marcello Moores  Register   On: 07/29/2016 07:34   Dg Abd Portable 1v  Result Date: 07/28/2016 CLINICAL DATA:  NG tube placement.  Nausea and vomiting. EXAM: PORTABLE ABDOMEN - 1 VIEW COMPARISON:  CT yesterday. FINDINGS: Tip and side port of the enteric tube below the diaphragm in the stomach. Persistently dilated small bowel loops in the abdomen measuring up to 5.4 cm. No evidence of free air on the supine view. IMPRESSION: Tip and side port of the enteric tube below the diaphragm in the stomach. Electronically Signed   By: Jeb Levering M.D.   On: 07/28/2016 20:40   Prior to Admission medications   Medication Sig Start Date End Date Taking? Authorizing Provider  allopurinol (ZYLOPRIM) 300 MG tablet Take 150 mg by mouth 2 (two) times daily.   Yes Historical Provider, MD  aspirin EC 81 MG tablet Take 81 mg by mouth daily.   Yes Historical Provider, MD  DULoxetine (CYMBALTA) 60 MG capsule Take 60 mg by mouth daily.   Yes Historical Provider, MD  furosemide (LASIX) 40 MG tablet Take 40 mg by mouth daily as needed for edema.   Yes Historical Provider, MD  Multiple Vitamin (MULTIVITAMIN WITH MINERALS) TABS tablet Take 1 tablet by mouth daily.   Yes Historical Provider, MD  ondansetron (ZOFRAN) 4 MG tablet Take 4 mg by mouth every 8 (eight) hours as needed  for nausea or vomiting.   Yes Historical Provider, MD  pantoprazole (PROTONIX) 40 MG tablet Take 40 mg by mouth daily.   Yes Historical Provider, MD  potassium chloride SA (K-DUR,KLOR-CON) 20 MEQ tablet Take 20 mEq by mouth every other day.   Yes Historical Provider, MD  sucralfate (CARAFATE) 1 g tablet Take 1 g by mouth every 4 (four) hours.   Yes Historical Provider, MD  triamterene-hydrochlorothiazide (DYAZIDE) 37.5-25 MG capsule Take 1 capsule by mouth every other day.   Yes Historical Provider, MD     Medications: . Benzocaine    Mouth/Throat Once  . famotidine (PEPCID) IV  20 mg Intravenous Q12H   . dexrose 5 % and 0.45 % NaCl with KCl 30 mEq/L 1,000 mL (07/29/16 2354)   Assessment/Plan SBO - improving, multiple bowel movements Left nephrolithiasis. Mild renal insufficiency FEN:  IV fluids - clear liquids now, full liquids by supper if she does well. ID: No antibiotics DVT: Adding Lovenox/SCD    Plan: DC NG tube and start clear liquids if she does well full liquids by supper.  LOS: 2 days    JENNINGS,WILLARD 07/30/2016 [phone]  Looks good. NGT out.  On clear liquids.  Had BM. Probably home tomorrow.  Daughter in room.  Alphonsa Overall, MD, [facility] Pager: [phone] Office phone:  [phone]

## 2016-07-31 LAB — CBC
HEMATOCRIT: 36.9 % (ref 36.0–46.0)
HEMOGLOBIN: 12.4 g/dL (ref 12.0–15.0)
MCH: 30.8 pg (ref 26.0–34.0)
MCHC: 33.6 g/dL (ref 30.0–36.0)
MCV: 91.6 fL (ref 78.0–100.0)
Platelets: 188 10*3/uL (ref 150–400)
RBC: 4.03 MIL/uL (ref 3.87–5.11)
RDW: 13.8 % (ref 11.5–15.5)
WBC: 7.2 10*3/uL (ref 4.0–10.5)

## 2016-07-31 MED ORDER — POTASSIUM CHLORIDE CRYS ER 20 MEQ PO TBCR: 20.0000 meq | EXTENDED_RELEASE_TABLET | ORAL | Status: DC

## 2016-07-31 MED ORDER — ASPIRIN EC 81 MG PO TBEC
81.0000 mg | DELAYED_RELEASE_TABLET | Freq: Every day | ORAL | Status: DC
Start: 2016-07-31 — End: 2016-07-31
  Administered 2016-07-31: 81 mg via ORAL
  Filled 2016-07-31: qty 1

## 2016-07-31 MED ORDER — PANTOPRAZOLE SODIUM 40 MG PO TBEC
40.0000 mg | DELAYED_RELEASE_TABLET | Freq: Every day | ORAL | Status: DC
  Administered 2016-07-31: 40 mg via ORAL
  Filled 2016-07-31: qty 1

## 2016-07-31 MED ORDER — SUCRALFATE 1 G PO TABS
1.0000 g | ORAL_TABLET | ORAL | Status: DC
  Administered 2016-07-31 (×2): 1 g via ORAL
  Filled 2016-07-31 (×2): qty 1

## 2016-07-31 MED ORDER — ALLOPURINOL 300 MG PO TABS
150.0000 mg | ORAL_TABLET | Freq: Two times a day (BID) | ORAL | Status: DC
  Administered 2016-07-31: 150 mg via ORAL
  Filled 2016-07-31: qty 1

## 2016-07-31 MED ORDER — TRIAMTERENE-HCTZ 37.5-25 MG PO CAPS: 1.0000 | ORAL_CAPSULE | ORAL | Status: DC

## 2016-07-31 MED ORDER — DULOXETINE HCL 60 MG PO CPEP
60.0000 mg | ORAL_CAPSULE | Freq: Every day | ORAL | Status: DC
  Administered 2016-07-31: 60 mg via ORAL
  Filled 2016-07-31: qty 1

## 2016-07-31 NOTE — Progress Notes (Signed)
Discharge instructions reviewed with patient utilizing teach back method. Patient being discharged to home  

## 2016-07-31 NOTE — Discharge Instructions (Signed)
Soft-Food Meal Plan for the next week. A soft-food meal plan includes foods that are safe and easy to swallow. This meal plan typically is used:  If you are having trouble chewing or swallowing foods.  As a transition meal plan after only having had liquid meals for a long period. WHAT DO I NEED TO KNOW ABOUT THE SOFT-FOOD MEAL PLAN? A soft-food meal plan includes tender foods that are soft and easy to chew and swallow. In most cases, bite-sized pieces of food are easier to swallow. A bite-sized piece is about  inch or smaller. Foods in this plan do not need to be ground or pureed. Foods that are very hard, crunchy, or sticky should be avoided. Also, breads, cereals, yogurts, and desserts with nuts, seeds, or fruits should be avoided. WHAT FOODS CAN I EAT? Grains Rice and wild rice. Moist bread, dressing, pasta, and noodles. Well-moistened dry or cooked cereals, such as farina (cooked wheat cereal), oatmeal, or grits. Biscuits, breads, muffins, pancakes, and waffles that have been well moistened. Vegetables Shredded lettuce. Cooked, tender vegetables, including potatoes without skins. Vegetable juices. Broths or creamed soups made with vegetables that are not stringy or chewy. Strained tomatoes (without seeds). Fruits Canned or well-cooked fruits. Soft (ripe), peeled fresh fruits, such as peaches, nectarines, kiwi, cantaloupe, honeydew melon, and watermelon (without seeds). Soft berries with small seeds, such as strawberries. Fruit juices (without pulp). Meats and Other Protein Sources Moist, tender, lean beef. Mutton. Lamb. Veal. Chicken. [place]. Liver. Ham. Fish without bones. Eggs. Dairy Milk, milk drinks, and cream. Plain cream cheese and cottage cheese. Plain yogurt. Sweets/Desserts Flavored gelatin desserts. Custard. Plain ice cream, frozen yogurt, sherbet, milk shakes, and malts. Plain cakes and cookies. Plain hard candy.  Other Butter, margarine (without trans fat), and cooking oils.  Mayonnaise. Cream sauces. Mild spices, salt, and sugar. Syrup, molasses, honey, and jelly. The items listed above may not be a complete list of recommended foods or beverages. Contact your dietitian for more options. WHAT FOODS ARE NOT RECOMMENDED? Grains Dry bread, toast, crackers that have not been moistened. Coarse or dry cereals, such as bran, granola, and shredded wheat. Tough or chewy crusty breads, such as [place] bread or baguettes. Vegetables Corn. Raw vegetables except shredded lettuce. Cooked vegetables that are tough or stringy. Tough, crisp, fried potatoes and potato skins. Fruits Fresh fruits with skins or seeds or both, such as apples, pears, or grapes. Stringy, high-pulp fruits, such as papaya, pineapple, coconut, or mango. Fruit leather, fruit roll-ups, and all dried fruits. Meats and Other Protein Sources Sausages and hot dogs. Meats with gristle. Fish with bones. Nuts, seeds, and chunky peanut or other nut butters. Sweets/Desserts Cakes or cookies that are very dry or chewy.  The items listed above may not be a complete list of foods and beverages to avoid. Contact your dietitian for more information.   This information is not intended to replace advice given to you by your health care provider. Make sure you discuss any questions you have with your health care provider.   Document Released: 03/17/2008 Document Revised: 12/14/2013 Document Reviewed: 11/05/2013 Elsevier Interactive Patient Education 2016 Elsevier Inc. High-Fiber Diet after 1 week on the soft diet Fiber, also called dietary fiber, is a type of carbohydrate found in fruits, vegetables, whole grains, and beans. A high-fiber diet can have many health benefits. Your health care provider may recommend a high-fiber diet to help:  Prevent constipation. Fiber can make your bowel movements more regular.  Lower your cholesterol.  Relieve hemorrhoids, uncomplicated diverticulosis, or irritable bowel  syndrome.  Prevent overeating as part of a weight-loss plan.  Prevent heart disease, type 2 diabetes, and certain cancers. WHAT IS MY PLAN? The recommended daily intake of fiber includes:  38 grams for men under age 68.  36 grams for men over age 21.  51 grams for women under age 72.  37 grams for women over age 48. You can get the recommended daily intake of dietary fiber by eating a variety of fruits, vegetables, grains, and beans. Your health care provider may also recommend a fiber supplement if it is not possible to get enough fiber through your diet. WHAT DO I NEED TO KNOW ABOUT A HIGH-FIBER DIET?  Fiber supplements have not been widely studied for their effectiveness, so it is better to get fiber through food sources.  Always check the fiber content on thenutrition facts label of any prepackaged food. Look for foods that contain at least 5 grams of fiber per serving.  Ask your dietitian if you have questions about specific foods that are related to your condition, especially if those foods are not listed in the following section.  Increase your daily fiber consumption gradually. Increasing your intake of dietary fiber too quickly may cause bloating, cramping, or gas.  Drink plenty of water. Water helps you to digest fiber. WHAT FOODS CAN I EAT? Grains Whole-grain breads. Multigrain cereal. Oats and oatmeal. Brown rice. Barley. Bulgur wheat. [place]. Bran muffins. Popcorn. Rye wafer crackers. Vegetables Sweet potatoes. Spinach. Kale. Artichokes. Cabbage. Broccoli. Green peas. Carrots. Squash. Fruits Berries. Pears. Apples. Oranges. Avocados. Prunes and raisins. Dried figs. Meats and Other Protein Sources [place], kidney, pinto, and soy beans. Split peas. Lentils. Nuts and seeds. Dairy Fiber-fortified yogurt. Beverages Fiber-fortified soy milk. Fiber-fortified orange juice. Other Fiber bars. The items listed above may not be a complete list of recommended foods or  beverages. Contact your dietitian for more options. WHAT FOODS ARE NOT RECOMMENDED? Grains White bread. Pasta made with refined flour. White rice. Vegetables Fried potatoes. Canned vegetables. Well-cooked vegetables.  Fruits Fruit juice. Cooked, strained fruit. Meats and Other Protein Sources Fatty cuts of meat. Fried Sales executive or fried fish. Dairy Milk. Yogurt. Cream cheese. Sour cream. Beverages Soft drinks. Other Cakes and pastries. Butter and oils. The items listed above may not be a complete list of foods and beverages to avoid. Contact your dietitian for more information. WHAT ARE SOME TIPS FOR INCLUDING HIGH-FIBER FOODS IN MY DIET?  Eat a wide variety of high-fiber foods.  Make sure that half of all grains consumed each day are whole grains.  Replace breads and cereals made from refined flour or white flour with whole-grain breads and cereals.  Replace white rice with brown rice, bulgur wheat, or millet.  Start the day with a breakfast that is high in fiber, such as a cereal that contains at least 5 grams of fiber per serving.  Use beans in place of meat in soups, salads, or pasta.  Eat high-fiber snacks, such as berries, raw vegetables, nuts, or popcorn.   This information is not intended to replace advice given to you by your health care provider. Make sure you discuss any questions you have with your health care provider.   Document Released: 12/09/2005 Document Revised: 12/30/2014 Document Reviewed: 05/24/2014 Elsevier Interactive Patient Education [employer].

## 2016-07-31 NOTE — Progress Notes (Signed)
  Subjective: Tolerating soft diet having multiple bowel movements. No abdominal discomfort. Ready for discharge.  Objective: Vital signs in last 24 hours: Temp:  [98.2 F (36.8 C)-98.6 F (37 C)] 98.2 F (36.8 C) (08/09 0714) Pulse Rate:  [72-80] 72 (08/09 0714) Resp:  [15-18] 15 (08/09 0714) BP: (129-142)/(70-76) 129/76 (08/09 0714) SpO2:  [96 %-100 %] 98 % (08/09 0714) Last BM Date: 07/31/16 240 by mouth recorded BM 5 recorded Afebrile vital signs are stable CBC is normal, H/H is down with hydration Intake/Output from previous day: 08/08 0701 - 08/09 0700 In: 2640 [P.O.:240; I.V.:2400] Out: 1250 [Urine:1250] Intake/Output this shift: No intake/output data recorded.  General appearance: alert, cooperative and no distress Resp: clear to auscultation bilaterally GI: soft, non-tender; bowel sounds normal; no masses,  no organomegaly  Lab Results:   Recent Labs  07/31/16 0448  WBC 7.2  HGB 12.4  HCT 36.9  PLT 188    BMET  Recent Labs  07/30/16 0517  NA 142  K 4.0  CL 109  CO2 27  GLUCOSE 108*  BUN 15  CREATININE 0.96  CALCIUM 8.9   PT/INR No results for input(s): LABPROT, INR in the last 72 hours.   Recent Labs Lab 07/27/16 2140  AST 27  ALT 36  ALKPHOS 113  BILITOT 0.9  PROT 8.0  ALBUMIN 4.8     Lipase     Component Value Date/Time   LIPASE 22 07/27/2016 2140     Studies/Results: No results found.  Medications: . allopurinol  150 mg Oral BID  . aspirin EC  81 mg Oral Daily  . Benzocaine   Mouth/Throat Once  . DULoxetine  60 mg Oral Daily  . enoxaparin (LOVENOX) injection  60 mg Subcutaneous Q24H  . pantoprazole  40 mg Oral Daily  . [START ON 08/01/2016] potassium chloride SA  20 mEq Oral QODAY  . sucralfate  1 g Oral Q4H  . [START ON 08/01/2016] triamterene-hydrochlorothiazide  1 capsule Oral QODAY    Assessment/Plan SBO - improving, multiple bowel movements   Left nephrolithiasis. Mild renal insufficiency FEN:  IV fluids  - clear liquids now, full liquids by supper if she does well. ID: No antibiotics DVT: Adding Lovenox/SCD    Plan discharge home today follow-up with PCP. in our office on a when necessary basis.   LOS: 3 days    JENNINGS,WILLARD 07/31/2016 [phone]  Agree with above. Okay to go home.  Alphonsa Overall, MD, [facility] Pager: [phone] Office phone:  [phone]

## 2016-08-01 ENCOUNTER — Telehealth: Payer: Self-pay | Admitting: Behavioral Health

## 2016-08-01 NOTE — Telephone Encounter (Signed)
Transition Care Management Follow-up Telephone Call   Date discharged? 07/31/16   How have you been since you were released from the hospital? Patient stated, " I've been ok, so far no issues and I hope that I will not have to go through that again"; "they did not do surgery, the doctor told me that it was nothing I did to make it happen, it's just one of those things that was going to come about at some point due to adhesions from past surgery".   Do you understand why you were in the hospital? yes, patient reported bowel obstruction.   Do you understand the discharge instructions? yes   Where were you discharged to? Home   Items Reviewed:  Medications reviewed: yes  Allergies reviewed: yes  Dietary changes reviewed: yes, patient voiced that she was instructed to eat a soft diet for 1 week after discharge.  Referrals reviewed: yes, follow-up with PCP.   Functional Questionnaire:   Activities of Daily Living (ADLs):   She states they are independent in the following: ambulation, bathing and hygiene, feeding, continence, grooming, toileting and dressing States they require assistance with the following: None   Any transportation issues/concerns?: no   Any patient concerns? no   Confirmed importance and date/time of follow-up visits scheduled yes, patient voiced that she's leaving out today for [place] and will be gone for a month, so she will not be able to see her PCP until she returns from her trip. Appointment has been scheduled for 09/04/16 at 9:30 AM.  Provider Appointment booked with [name], NP.  Confirmed with patient if condition begins to worsen call PCP or go to the ER.  Patient was given the office number and encouraged to call back with question or concerns.  : yes

## 2016-08-05 NOTE — Discharge Summary (Signed)
Physician Discharge Summary  Patient ID: Jordan [name] MRN: [id number] DOB/AGE: 66-Jan-1952 66 y.o.  Admit date: 07/27/2016 Discharge date: 07/31/2016  Admission Diagnoses:  SBO Left nephrolithiasis  Mild renal insuffiencey   Discharge Diagnoses:  Same    Active Problems:   Small bowel obstruction [facility])   PROCEDURES: None  Hospital Course:  Patient is a 65 year old female who works at [employer] and presents with a 5 day history of abdominal pain, nausea, vomiting, and watery diarrhea. Patient has had similar episodes over the past several months which have been less severe. Patient and her family note foul-smelling emesis. Patient presented to the emergency room for assessment. She was noted to be dehydrated with an elevated creatinine and mild hypokalemia. Patient received several liters of fluid resuscitation as well as potassium supplements. She underwent CT scan of the abdomen and pelvis which shows dilated proximal small bowel and stomach consistent with a transition point in the mid small bowel and small bowel obstruction. Nasogastric tube was placed. Patient was then transferred to [facility] for admission and surgical evaluation. Patient has a complex past surgical history with multiple biliary procedures for apparent cholelithiasis and choledocholithiasis. Patient has also undergone total hysterectomy, and appendectomy.  Patient is now admitted to the surgical service by DR. Gerkin.  NG was placed and she was placed on NG suction, bowel rest and IV hydration.  Her NG was removed on 07/30/16 after multiple bowel movements.  Her diet was advanced over the next 24 hours.  By the AM of 8/9 she was tolerating soft diet and ready for discharge.  .  CBC Latest Ref Rng & Units 07/31/2016 07/28/2016 07/27/2016  WBC 4.0 - 10.5 K/uL 7.2 7.8 10.1  Hemoglobin 12.0 - 15.0 g/dL 12.4 13.6 16.4(H)  Hematocrit 36.0 - 46.0 % 36.9 40.3 46.4(H)  Platelets 150 - 400 K/uL 188 220 272    CMP Latest Ref Rng & Units 07/30/2016 07/28/2016 07/27/2016  Glucose 65 - 99 mg/dL 108(H) 141(H) 161(H)  BUN 6 - 20 mg/dL 15 44(H) 50(H)  Creatinine 0.44 - 1.00 mg/dL 0.96 1.42(H) 2.04(H)  Sodium 135 - 145 mmol/L 142 135 131(L)  Potassium 3.5 - 5.1 mmol/L 4.0 3.2(L) 3.4(L)  Chloride 101 - 111 mmol/L 109 102 93(L)  CO2 22 - 32 mmol/L 27 23 24   Calcium 8.9 - 10.3 mg/dL 8.9 8.0(L) 9.5  Total Protein 6.5 - 8.1 g/dL - - 8.0  Total Bilirubin 0.3 - 1.2 mg/dL - - 0.9  Alkaline Phos 38 - 126 U/L - - 113  AST 15 - 41 U/L - - 27  ALT 14 - 54 U/L - - 36    Condition on D/C:  Improved     Disposition: 01-Home or Self Care     Medication List    TAKE these medications   allopurinol 300 MG tablet Commonly known as:  ZYLOPRIM Take 150 mg by mouth 2 (two) times daily.   aspirin EC 81 MG tablet Take 81 mg by mouth daily.   DULoxetine 60 MG capsule Commonly known as:  CYMBALTA Take 60 mg by mouth daily.   furosemide 40 MG tablet Commonly known as:  LASIX Take 40 mg by mouth daily as needed for edema.   multivitamin with minerals Tabs tablet Take 1 tablet by mouth daily.   ondansetron 4 MG tablet Commonly known as:  ZOFRAN Take 4 mg by mouth every 8 (eight) hours as needed for nausea or vomiting.   pantoprazole 40 MG  tablet Commonly known as:  PROTONIX Take 40 mg by mouth daily.   potassium chloride SA 20 MEQ tablet Commonly known as:  K-DUR,KLOR-CON Take 20 mEq by mouth every other day.   sucralfate 1 g tablet Commonly known as:  CARAFATE Take 1 g by mouth every 4 (four) hours.   triamterene-hydrochlorothiazide 37.5-25 MG capsule Commonly known as:  DYAZIDE Take 1 capsule by mouth every other day.      Follow-up Information    Nance Pear., NP .   Specialty:  Internal Medicine Why:  Call and follow-up for management of medical issues. Contact information: [place] [address] [phone]        [facility] .    Specialty:  General Surgery Why:  Our service followed you during your hospitalization. If you have a recurrence of symptoms, please return to the emergency department for evaluation. You can call our office if you have questions. You do not require a return visit. Contact information: [address] [place] [address] [phone]           Signed: Earnstine Regal 08/05/2016, 4:33 PM  Agree with above.  Alphonsa Overall, MD, [facility] Pager: [phone] Office phone:  [phone]

## 2016-08-06 ENCOUNTER — Telehealth: Payer: Self-pay | Admitting: Family

## 2016-08-06 NOTE — Telephone Encounter (Signed)
Please contact patient to arrange follow up with me before the end of the week.

## 2016-08-09 NOTE — Telephone Encounter (Signed)
Called pt. No answer. Left detailed voicemail for  Pt to schedule fu.

## 2016-08-27 ENCOUNTER — Other Ambulatory Visit: Payer: Self-pay | Admitting: Family

## 2016-08-27 MED FILL — FUROSEMIDE 40 MG TABLET: 40 | 90 days supply | Qty: 90 | Fill #0

## 2016-08-27 MED FILL — DULoxetine HCL 60 MG CPEP: 60 | 90 days supply | Qty: 90 | Fill #0

## 2016-08-27 MED FILL — ALLOPURINOL 300 MG TABLET: 300 | 90 days supply | Qty: 90 | Fill #0

## 2016-08-27 MED FILL — TRIAMTERENE/HCTZ 37.5/25 CP: 37.5-25 | 90 days supply | Qty: 90 | Fill #0

## 2016-08-29 MED FILL — PANTOPRAZOLE SOD DR 40 MG T: 40 | 90 days supply | Qty: 90 | Fill #1

## 2016-09-04 ENCOUNTER — Encounter: Payer: Self-pay | Admitting: Family

## 2016-09-04 ENCOUNTER — Ambulatory Visit (INDEPENDENT_AMBULATORY_CARE_PROVIDER_SITE_OTHER): Payer: Medicare Other | Admitting: Family

## 2016-09-04 VITALS — BP 128/90 | HR 79 | Temp 97.7°F | Resp 16 | Ht 63.75 in | Wt 280.0 lb

## 2016-09-04 DIAGNOSIS — K219 Gastro-esophageal reflux disease without esophagitis: Secondary | ICD-10-CM | POA: Diagnosis not present

## 2016-09-04 DIAGNOSIS — K56609 Unspecified intestinal obstruction, unspecified as to partial versus complete obstruction: Secondary | ICD-10-CM

## 2016-09-04 DIAGNOSIS — F32A Depression, unspecified: Secondary | ICD-10-CM

## 2016-09-04 DIAGNOSIS — K5669 Other intestinal obstruction: Secondary | ICD-10-CM | POA: Diagnosis not present

## 2016-09-04 DIAGNOSIS — Z23 Encounter for immunization: Secondary | ICD-10-CM | POA: Diagnosis not present

## 2016-09-04 DIAGNOSIS — F329 Major depressive disorder, single episode, unspecified: Secondary | ICD-10-CM

## 2016-09-04 DIAGNOSIS — R739 Hyperglycemia, unspecified: Secondary | ICD-10-CM | POA: Diagnosis not present

## 2016-09-04 DIAGNOSIS — I10 Essential (primary) hypertension: Secondary | ICD-10-CM

## 2016-09-04 LAB — HEMOGLOBIN A1C: Hgb A1c MFr Bld: 6.2 % (ref 4.6–6.5)

## 2016-09-04 LAB — BASIC METABOLIC PANEL
BUN: 21 mg/dL (ref 6–23)
CHLORIDE: 103 meq/L (ref 96–112)
CO2: 28 mEq/L (ref 19–32)
Calcium: 9.5 mg/dL (ref 8.4–10.5)
Creatinine, Ser: 1.11 mg/dL (ref 0.40–1.20)
GFR: 52.28 mL/min — ABNORMAL LOW (ref >60.00)
Glucose, Bld: 121 mg/dL — ABNORMAL HIGH (ref 70–99)
POTASSIUM: 3.9 meq/L (ref 3.5–5.1)
Sodium: 140 mEq/L (ref 135–145)

## 2016-09-04 NOTE — Patient Instructions (Signed)
Please complete lab work prior to leaving.   

## 2016-09-04 NOTE — Assessment & Plan Note (Signed)
Stable on cymbalta, continue same.   

## 2016-09-04 NOTE — Assessment & Plan Note (Signed)
Obtain follow up A1C.   

## 2016-09-04 NOTE — Assessment & Plan Note (Signed)
Stable on PPI, continue same.  

## 2016-09-04 NOTE — Assessment & Plan Note (Signed)
Resolved

## 2016-09-04 NOTE — Progress Notes (Signed)
Subjective:    Patient ID: Jordan [name], female    DOB: 1951/08/29, 65 y.o.   MRN: [id number]  HPI  Jordan [name] is a 65 yr old female who presents today for hospital follow up of her SBO. Discharge summary is reviewed. Pt was admitted 07/27/16-07/31/16 with SBO and left nephrolithiasis. She did not require surgical intervention. SBO resolved with NGT. She reports no further GI issues since D/C.  Depression- reports that she weaned herself off of wellbutrin due to some anger.  She feels well on cymbalta.   Gout- continues allopurinol, no gout flares.    HTN- continues lasix (PRN swelling) and dyazide QOD. She has not been using potassium recently.  BP Readings from Last 3 Encounters:  09/04/16 128/90  07/31/16 129/76  01/12/16 130/84   GERD- continues protonix. Reflux remains well controlled.    Lab Results  Component Value Date   HGBA1C 6.0 11/29/2015    Review of Systems See HPI  Past Medical History:  Diagnosis Date  . Cellulitis and abscess of finger, unspecified 401.9  . Cellulitis and abscess of foot, except toes   . Depressive disorder, not elsewhere classified   . GERD (gastroesophageal reflux disease)   . Gout, unspecified   . Hyperglycemia 05/08/2011  . Hyperlipidemia    borderline- not on meds  . Insomnia   . Leg edema    chronic  . Obesity, unspecified   . Osteoarthritis   . Renal insufficiency, mild 05/08/2011     Social History   Social History  . Marital status: Single    Spouse name: N/A  . Number of children: 2  . Years of education: N/A   Occupational History  .  [employer]   Social History Main Topics  . Smoking status: Former Smoker    Packs/day: 0.25    Types: Cigarettes    Quit date: 12/26/2014  . Smokeless tobacco: Never Used  . Alcohol use 0.0 oz/week     Comment: rare  . Drug use: No  . Sexual activity: Not on file   Other Topics Concern  . Not on file   Social History Narrative   2 children (daughter and son) both local. 3  grandchildren, one great grandchild   Works at ED front desk   Divorced (married x 30 years)       Past Surgical History:  Procedure Laterality Date  . ABDOMINAL HYSTERECTOMY  2002   infection- no history of cancer  . APPENDECTOMY  2002  . CHOLECYSTECTOMY  1977  . ECTOPIC PREGNANCY SURGERY  1971  . right shoulder rotator cuff repair  06-07-10  . TOTAL HIP ARTHROPLASTY Left 1998    Family History  Problem Relation Age of Onset  . Diabetes Mother   . Cancer Mother     colon/ pancreatic  . Colon cancer Mother 9  . Hypertension Father   . Alcohol abuse Father   . Cirrhosis Father   . Other Sister     Pyeoderma gangrenosis  . Obesity Daughter   . Diabetes Sister     type 2  . Heart attack Brother   . Cirrhosis Sister     had NASH, died heart failure    No Known Allergies  Current Outpatient Prescriptions on File Prior to Visit  Medication Sig Dispense Refill  . allopurinol (ZYLOPRIM) 300 MG tablet TAKE 1/2 TABLET BY MOUTH 2 TIMES A DAY 90 tablet 0  . aspirin EC 81 MG tablet Take 81 mg by mouth  daily.    . DULoxetine (CYMBALTA) 60 MG capsule TAKE 1 CAPSULE (60 MG) BY MOUTH DAILY. 90 capsule 0  . furosemide (LASIX) 40 MG tablet TAKE 1 TABLET (40 MG TOTAL) BY MOUTH DAILY AS NEEDED. 90 tablet 0  . Multiple Vitamin (MULTIVITAMIN WITH MINERALS) TABS tablet Take 1 tablet by mouth daily.    . ondansetron (ZOFRAN) 4 MG tablet Take 4 mg by mouth every 8 (eight) hours as needed for nausea or vomiting.    . pantoprazole (PROTONIX) 40 MG tablet Take 40 mg by mouth daily.    Marland Kitchen triamterene-hydrochlorothiazide (DYAZIDE) 37.5-25 MG capsule TAKE 1 CAPSULE BY MOUTH EVERY MORNING. 90 capsule 0  . potassium chloride SA (K-DUR,KLOR-CON) 20 MEQ tablet Take 20 mEq by mouth every other day.    . [DISCONTINUED] famotidine (PEPCID AC) 10 MG chewable tablet Chew 10 mg by mouth daily.       No current facility-administered medications on file prior to visit.     BP 128/90   Pulse 79   Temp 97.7  F (36.5 C) (Oral)   Resp 16   Ht 5' 3.75" (1.619 m)   Wt 280 lb (127 kg)   SpO2 97% Comment: room air  BMI 48.44 kg/m       Objective:   Physical Exam  Constitutional: She is oriented to person, place, and time. She appears well-developed and well-nourished.  HENT:  Head: Normocephalic and atraumatic.  Cardiovascular: Normal rate, regular rhythm and normal heart sounds.   No murmur heard. Pulmonary/Chest: Effort normal and breath sounds normal. No respiratory distress. She has no wheezes.  Musculoskeletal: She exhibits no edema.  Neurological: She is alert and oriented to person, place, and time.  Psychiatric: She has a normal mood and affect. Her behavior is normal. Judgment and thought content normal.          Assessment & Plan:  Prevnar Today.

## 2016-09-04 NOTE — Assessment & Plan Note (Signed)
BP is stable.  Continue current meds. Will obtain follow up bmet due to pt not taking Kdur.

## 2016-09-04 NOTE — Progress Notes (Signed)
Pre visit review using our clinic review tool, if applicable. No additional management support is needed unless otherwise documented below in the visit note. 

## 2016-09-04 NOTE — Addendum Note (Signed)
Addended by: Kelle Darting A on: 09/04/2016 03:05 PM   Modules accepted: Orders

## 2016-11-20 ENCOUNTER — Other Ambulatory Visit: Payer: Self-pay | Admitting: Family

## 2016-11-20 MED FILL — FUROSEMIDE 40 MG TABLET: 40 | 90 days supply | Qty: 90 | Fill #0

## 2016-11-20 MED FILL — ALLOPURINOL 300 MG TABLET: 300 | 90 days supply | Qty: 90 | Fill #0

## 2016-11-20 MED FILL — PANTOPRAZOLE SOD DR 40 MG T: 40 | 90 days supply | Qty: 90 | Fill #0

## 2016-11-20 MED FILL — DULoxetine HCL 60 MG CPEP: 60 | 90 days supply | Qty: 90 | Fill #0

## 2016-11-20 MED FILL — TRIAMTERENE/HCTZ 37.5/25 CP: 37.5-25 | 90 days supply | Qty: 90 | Fill #0

## 2016-11-20 NOTE — Telephone Encounter (Signed)
Refill sent per [facility] refill protocol/SLS  

## 2017-01-06 ENCOUNTER — Encounter: Payer: Self-pay | Admitting: Family Medicine

## 2017-01-07 ENCOUNTER — Encounter: Payer: Self-pay | Admitting: *Deleted

## 2017-01-07 ENCOUNTER — Ambulatory Visit: Payer: Medicare Other | Admitting: *Deleted

## 2017-01-07 VITALS — BP 124/72 | HR 82 | Resp 18 | Ht 63.0 in | Wt 290.2 lb

## 2017-01-07 DIAGNOSIS — Z1239 Encounter for other screening for malignant neoplasm of breast: Secondary | ICD-10-CM

## 2017-01-07 DIAGNOSIS — Z0000 Encounter for general adult medical examination without abnormal findings: Secondary | ICD-10-CM | POA: Diagnosis not present

## 2017-01-07 DIAGNOSIS — Z1231 Encounter for screening mammogram for malignant neoplasm of breast: Secondary | ICD-10-CM | POA: Diagnosis not present

## 2017-01-07 NOTE — Progress Notes (Signed)
Subjective:   Jordan [name] is a 66 y.o. female who presents for an Initial Medicare Annual Wellness Visit.  Review of Systems    No ROS.  Medicare Wellness Visit.  Cardiac Risk Factors include: sedentary lifestyle;obesity (BMI >30kg/m2);hypertension;advanced age (>13men, >64 women)  Sleep patterns: no sleep issues, feels rested on waking and does not get up to void. 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  Home Safety/Smoke Alarms: Feels safe in home. Smoke alarms in place.    Living environment; residence and Firearm Safety: Lives w/ son and his wife. 2-story house, firearms stored safely.   Counseling:   Eye Exam- Dr. Altamese Sardis in [place] yearly. Has upcoming vision exam. Dental- Dr. Olin Hauser Darr every 6 months  Female:   Pap- N/A, hysterectomy       Mammo- last 09/01/15. BI-RADS CATEGORY  1: Negative.       Dexa scan- last 09/01/15. Normal.      CCS- last 01/14/14. Severe diverticulosis, otherwise normal. 5 year recall d/t + family hx.     Objective:    Today's Vitals   01/07/17 0948  BP: 124/72  Pulse: 82  Resp: 18  SpO2: 97%  Weight: 290 lb 3.2 oz (131.6 kg)  Height: 5\' 3"  (1.6 m)   Body mass index is 51.41 kg/m.   Current Medications (verified) Outpatient Encounter Prescriptions as of 01/07/2017  Medication Sig  . allopurinol (ZYLOPRIM) 300 MG tablet TAKE 1/2 TABLET BY MOUTH 2 TIMES A DAY  . aspirin EC 81 MG tablet Take 81 mg by mouth daily.  . DULoxetine (CYMBALTA) 60 MG capsule TAKE 1 CAPSULE (60 MG) BY MOUTH DAILY.  . furosemide (LASIX) 40 MG tablet TAKE 1 TABLET (40 MG TOTAL) BY MOUTH DAILY AS NEEDED.  . Multiple Vitamin (MULTIVITAMIN WITH MINERALS) TABS tablet Take 1 tablet by mouth daily.  . pantoprazole (PROTONIX) 40 MG tablet TAKE 1 TABLET (40 MG TOTAL) BY MOUTH DAILY.  Marland Kitchen triamterene-hydrochlorothiazide (DYAZIDE) 37.5-25 MG capsule TAKE 1 CAPSULE BY MOUTH EVERY MORNING. (Patient taking differently: TAKE 1 CAPSULE BY MOUTH EVERY OTHER MORNING.)  .  [DISCONTINUED] ondansetron (ZOFRAN) 4 MG tablet Take 4 mg by mouth every 8 (eight) hours as needed for nausea or vomiting.   No facility-administered encounter medications on file as of 01/07/2017.     Allergies (verified) Patient has no known allergies.   History: Past Medical History:  Diagnosis Date  . Cellulitis and abscess of finger, unspecified 401.9  . Cellulitis and abscess of foot, except toes   . Depressive disorder, not elsewhere classified   . GERD (gastroesophageal reflux disease)   . Gout, unspecified   . Hyperglycemia 05/08/2011  . Hyperlipidemia    borderline- not on meds  . Insomnia   . Leg edema    chronic  . Obesity, unspecified   . Osteoarthritis   . Renal insufficiency, mild 05/08/2011  . Small bowel obstruction    Past Surgical History:  Procedure Laterality Date  . ABDOMINAL HYSTERECTOMY  2002   infection- no history of cancer  . APPENDECTOMY  2002  . CHOLECYSTECTOMY  1977  . ECTOPIC PREGNANCY SURGERY  1971  . right shoulder rotator cuff repair  06-07-10  . TOTAL HIP ARTHROPLASTY Left 1998   Family History  Problem Relation Age of Onset  . Diabetes Mother   . Cancer Mother     colon/ pancreatic  . Colon cancer Mother 39  . Hypertension Father   . Alcohol abuse Father   . Cirrhosis Father   .  Other Sister     Pyeoderma gangrenosis  . Obesity Daughter   . Diabetes Sister     type 2  . Kidney failure Sister   . Liver disease Sister   . Heart attack Brother   . Cirrhosis Sister     had NASH, died heart failure   Social History   Occupational History  .  [employer]   Social History Main Topics  . Smoking status: Former Smoker    Packs/day: 0.25    Types: Cigarettes    Quit date: 12/26/2014  . Smokeless tobacco: Never Used  . Alcohol use 0.0 oz/week     Comment: rare  . Drug use: No  . Sexual activity: No    Tobacco Counseling Counseling given: Not Answered   Activities of Daily Living In your present state of health, do you  have any difficulty performing the following activities: 01/07/2017 07/28/2016  Hearing? N N  Vision? N N  Difficulty concentrating or making decisions? N N  Walking or climbing stairs? N N  Dressing or bathing? N N  Doing errands, shopping? N N  Preparing Food and eating ? N -  Using the Toilet? N -  In the past six months, have you accidently leaked urine? Y -  Do you have problems with loss of bowel control? N -  Managing your Medications? N -  Managing your Finances? N -  Housekeeping or managing your Housekeeping? N -  Some recent data might be hidden    Immunizations and Health Maintenance Immunization History  Administered Date(s) Administered  . Influenza Whole 10/12/2009  . Influenza-Unspecified 09/21/2013, 08/23/2014, 09/22/2016  . Pneumococcal Conjugate-13 09/04/2016  . Pneumococcal Polysaccharide-23 12/23/2013  . Td 12/23/2004  . Tdap 08/11/2015  . Zoster 06/19/2011   There are no preventive care reminders to display for this patient.  Patient Care Team: [name], NP as PCP - General  Indicate any recent Medical Services you may have received from other than [facility] providers in the past year (date may be approximate).     Assessment:   This is a routine wellness examination for Jordan [name]. Physical assessment deferred to PCP.  Hearing/Vision screen Hearing Screening Comments: Able to hear conversational tones w/o difficulty. No issues reported. Passes whisper test.  Dietary issues and exercise activities discussed: Current Exercise Habits: The patient does not participate in regular exercise at present  Diet (meal preparation, eat out, water intake, caffeinated beverages, dairy products, fruits and vegetables): in general, an "unhealthy" diet, on average, 1-2 meals per day. Sometimes eats out, sometimes eats at home. Eats lots of soup during the winter. Frequently eats leftovers. Likes to eat fresh fruits and vegetables. Drinks sparkling water. Drinks 1-2  cups coffee in the mornings. Drinks some diet Coke and Monster energy drinks.     Goals    . Have 3 meals a day    . Increase physical activity    . Reduce portion size      Depression Screen PHQ 2/9 Scores 01/07/2017 11/29/2015  PHQ - 2 Score 1 1    Fall Risk Fall Risk  01/07/2017 11/29/2015  Falls in the past year? Yes No  Number falls in past yr: 2 or more -  Injury with Fall? No -    Cognitive Function: MMSE - Mini Mental State Exam 01/07/2017  Orientation to time 5  Orientation to Place 5  Registration 3  Attention/ Calculation 5  Recall 2  Language- name 2 objects 2  Language- repeat  1  Language- follow 3 step command 3  Language- read & follow direction 1  Write a sentence 1  Copy design 1  Total score 29        Screening Tests Health Maintenance  Topic Date Due  . MAMMOGRAM  08/31/2017  . PNA vac Low Risk Adult (2 of 2 - PPSV23) 12/23/2018  . COLONOSCOPY  01/14/2019  . TETANUS/TDAP  08/10/2025  . INFLUENZA VACCINE  Completed  . DEXA SCAN  Completed  . ZOSTAVAX  Completed  . Hepatitis C Screening  Completed      Plan:    Follow-up w/ PCP as scheduled.  Orders placed for MMG.  During the course of the visit, Jordan [name] was educated and counseled about the following appropriate screening and preventive services:   Vaccines to include Pneumoccal, Influenza, Hepatitis B, Td, Zostavax, HCV  Cardiovascular disease screening  Colorectal cancer screening  Bone density screening  Diabetes screening  Glaucoma screening  Mammography/PAP  Nutrition counseling   Patient Instructions (the written plan) were given to the patient.    Dorrene German, RN   01/07/2017

## 2017-01-07 NOTE — Patient Instructions (Signed)
  Jordan [name] , Thank you for taking time to come for your Medicare Wellness Visit. I appreciate your ongoing commitment to your health goals. Please review the following plan we discussed and let me know if I can assist you in the future.   These are the goals we discussed: Goals    . Have 3 meals a day    . Increase physical activity    . Reduce portion size       This is a list of the screening recommended for you and due dates:  Health Maintenance  Topic Date Due  . Mammogram  08/31/2017  . Pneumonia vaccines (2 of 2 - PPSV23) 12/23/2018  . Colon Cancer Screening  01/14/2019  . Tetanus Vaccine  08/10/2025  . Flu Shot  Completed  . DEXA scan (bone density measurement)  Completed  . Shingles Vaccine  Completed  .  Hepatitis C: One time screening is recommended by Center for Disease Control  (CDC) for  adults born from [birth year] through 1966.   Completed

## 2017-01-07 NOTE — Progress Notes (Signed)
Pre visit review using our clinic review tool, if applicable. No additional management support is needed unless otherwise documented below in the visit note. 

## 2017-01-15 ENCOUNTER — Ambulatory Visit: Payer: Medicare Other | Admitting: *Deleted

## 2017-01-20 ENCOUNTER — Ambulatory Visit (HOSPITAL_BASED_OUTPATIENT_CLINIC_OR_DEPARTMENT_OTHER)
Admission: RE | Admit: 2017-01-20 | Discharge: 2017-01-20 | Disposition: A | Discharge status: Home / Self Care | Payer: Medicare Other | Source: Ambulatory Visit | Attending: Family | Admitting: Family

## 2017-01-20 DIAGNOSIS — Z1231 Encounter for screening mammogram for malignant neoplasm of breast: Secondary | ICD-10-CM | POA: Insufficient documentation

## 2017-01-20 DIAGNOSIS — Z1239 Encounter for other screening for malignant neoplasm of breast: Secondary | ICD-10-CM

## 2017-01-20 NOTE — Progress Notes (Signed)
Noted and agree. 

## 2017-02-03 ENCOUNTER — Other Ambulatory Visit: Payer: Self-pay | Admitting: Family

## 2017-02-03 MED FILL — PANTOPRAZOLE SOD DR 40 MG T: 40 | 90 days supply | Qty: 90 | Fill #0

## 2017-02-21 ENCOUNTER — Other Ambulatory Visit: Payer: Self-pay | Admitting: Family

## 2017-02-24 ENCOUNTER — Ambulatory Visit: Payer: Medicare Other | Admitting: Family

## 2017-02-24 MED FILL — ALLOPURINOL 300 MG TABLET: 300 | 90 days supply | Qty: 90 | Fill #0

## 2017-02-24 MED FILL — TRIAMTERENE/HCTZ 37.5/25 CP: 37.5-25 | 90 days supply | Qty: 90 | Fill #0

## 2017-02-24 MED FILL — DULoxetine HCL 60 MG CPEP: 60 | 90 days supply | Qty: 90 | Fill #0

## 2017-02-26 ENCOUNTER — Encounter: Payer: Self-pay | Admitting: Family

## 2017-02-26 ENCOUNTER — Ambulatory Visit (INDEPENDENT_AMBULATORY_CARE_PROVIDER_SITE_OTHER): Payer: Medicare Other | Admitting: Family

## 2017-02-26 VITALS — BP 139/92 | HR 87 | Temp 98.4°F | Resp 16 | Ht 63.0 in | Wt 279.0 lb

## 2017-02-26 DIAGNOSIS — M109 Gout, unspecified: Secondary | ICD-10-CM | POA: Diagnosis not present

## 2017-02-26 DIAGNOSIS — F32A Depression, unspecified: Secondary | ICD-10-CM

## 2017-02-26 DIAGNOSIS — R739 Hyperglycemia, unspecified: Secondary | ICD-10-CM | POA: Diagnosis not present

## 2017-02-26 DIAGNOSIS — K219 Gastro-esophageal reflux disease without esophagitis: Secondary | ICD-10-CM | POA: Diagnosis not present

## 2017-02-26 DIAGNOSIS — F329 Major depressive disorder, single episode, unspecified: Secondary | ICD-10-CM

## 2017-02-26 DIAGNOSIS — I10 Essential (primary) hypertension: Secondary | ICD-10-CM | POA: Diagnosis not present

## 2017-02-26 LAB — BASIC METABOLIC PANEL
BUN: 19 mg/dL (ref 6–23)
CALCIUM: 10.4 mg/dL (ref 8.4–10.5)
CO2: 29 meq/L (ref 19–32)
Chloride: 100 mEq/L (ref 96–112)
Creatinine, Ser: 1.09 mg/dL (ref 0.40–1.20)
GFR: 53.31 mL/min — ABNORMAL LOW (ref >60.00)
GLUCOSE: 117 mg/dL — AB (ref 70–99)
Potassium: 3.5 mEq/L (ref 3.5–5.1)
SODIUM: 138 meq/L (ref 135–145)

## 2017-02-26 LAB — HEMOGLOBIN A1C: Hgb A1c MFr Bld: 6.4 % (ref 4.6–6.5)

## 2017-02-26 MED ORDER — DULOXETINE HCL 60 MG PO CPEP: ORAL_CAPSULE | ORAL | 0 refills | Status: DC

## 2017-02-26 MED ORDER — TRIAMTERENE-HCTZ 37.5-25 MG PO CAPS: 1.0000 | ORAL_CAPSULE | Freq: Every morning | ORAL | 0 refills | Status: DC

## 2017-02-26 MED ORDER — ALLOPURINOL 300 MG PO TABS: ORAL_TABLET | ORAL | 0 refills | Status: DC

## 2017-02-26 MED ORDER — FUROSEMIDE 40 MG PO TABS: 40.0000 mg | ORAL_TABLET | Freq: Every day | ORAL | 0 refills | Status: DC | PRN

## 2017-02-26 MED FILL — FUROSEMIDE 40 MG TABLET: 40 | 90 days supply | Qty: 90 | Fill #0

## 2017-02-26 NOTE — Assessment & Plan Note (Signed)
Stable on PPI.  Has not tolerated stopping medication. Continue protonix.

## 2017-02-26 NOTE — Patient Instructions (Signed)
Please complete lab work prior to leaving.  Follow up in 6 months.  

## 2017-02-26 NOTE — Assessment & Plan Note (Signed)
Stable on cymbalta, continue same.   

## 2017-02-26 NOTE — Assessment & Plan Note (Signed)
Stable on current medications, continue same.  

## 2017-02-26 NOTE — Assessment & Plan Note (Signed)
Stable on allopurinol, continue same.  

## 2017-02-26 NOTE — Progress Notes (Signed)
Pre visit review using our clinic review tool, if applicable. No additional management support is needed unless otherwise documented below in the visit note. 

## 2017-02-26 NOTE — Progress Notes (Signed)
Subjective:    Patient ID: Jordan [name], female    DOB: [birth year], 66 y.o.   MRN: [id number]  HPI  HTN- maintained on dyazide. Uses furosemide prn swelling.   BP Readings from Last 3 Encounters:  02/26/17 (!) 139/92  01/07/17 124/72  09/04/16 128/90   Depression- maintained on cymbalta. Sleeps ok, mood is good. Denies anxiety symptoms.   Gout- no gout flares on allopurinol.   GERD- on protonix, reports that symptoms return if she does not take her medication.   Review of Systems See HPI  Past Medical History:  Diagnosis Date  . Cellulitis and abscess of finger, unspecified 401.9  . Cellulitis and abscess of foot, except toes   . Depressive disorder, not elsewhere classified   . GERD (gastroesophageal reflux disease)   . Gout, unspecified   . Hyperglycemia 05/08/2011  . Hyperlipidemia    borderline- not on meds  . Insomnia   . Leg edema    chronic  . Obesity, unspecified   . Osteoarthritis   . Renal insufficiency, mild 05/08/2011  . Small bowel obstruction      Social History   Social History  . Marital status: Single    Spouse name: N/A  . Number of children: 2  . Years of education: N/A   Occupational History  .  [employer]   Social History Main Topics  . Smoking status: Former Smoker    Packs/day: 0.25    Types: Cigarettes    Quit date: 12/26/2014  . Smokeless tobacco: Never Used  . Alcohol use 0.0 oz/week     Comment: rare  . Drug use: No  . Sexual activity: No   Other Topics Concern  . Not on file   Social History Narrative   2 children (daughter and son) both local. 3 grandchildren, one great grandchild   Works at ED front desk   Divorced (married x 30 years)       Past Surgical History:  Procedure Laterality Date  . ABDOMINAL HYSTERECTOMY  2002   infection- no history of cancer  . APPENDECTOMY  2002  . CHOLECYSTECTOMY  1977  . ECTOPIC PREGNANCY SURGERY  1971  . right shoulder rotator cuff repair  06-07-10  . TOTAL HIP ARTHROPLASTY  Left 1998    Family History  Problem Relation Age of Onset  . Diabetes Mother   . Cancer Mother     colon/ pancreatic  . Colon cancer Mother 59  . Hypertension Father   . Alcohol abuse Father   . Cirrhosis Father   . Other Sister     Pyeoderma gangrenosis  . Obesity Daughter   . Diabetes Sister     type 2  . Kidney failure Sister   . Liver disease Sister   . Heart attack Brother   . Cirrhosis Sister     had NASH, died heart failure    No Known Allergies  Current Outpatient Prescriptions on File Prior to Visit  Medication Sig Dispense Refill  . aspirin EC 81 MG tablet Take 81 mg by mouth daily.    . Multiple Vitamin (MULTIVITAMIN WITH MINERALS) TABS tablet Take 1 tablet by mouth daily.    . pantoprazole (PROTONIX) 40 MG tablet TAKE 1 TABLET (40 MG TOTAL) BY MOUTH DAILY. 90 tablet 1  . [DISCONTINUED] famotidine (PEPCID AC) 10 MG chewable tablet Chew 10 mg by mouth daily.       No current facility-administered medications on file prior to visit.  BP (!) 139/92   Pulse 87   Temp 98.4 F (36.9 C) (Oral)   Resp 16   Ht 5\' 3"  (1.6 m)   Wt 279 lb (126.6 kg)   SpO2 98% Comment: room air  BMI 49.42 kg/m       Objective:   Physical Exam  Constitutional: She is oriented to person, place, and time. She appears well-developed and well-nourished.  HENT:  Head: Normocephalic.  Cardiovascular: Normal rate, regular rhythm and normal heart sounds.   No murmur heard. Pulmonary/Chest: Effort normal and breath sounds normal. No respiratory distress. She has no wheezes.  Musculoskeletal: She exhibits no edema.  Neurological: She is alert and oriented to person, place, and time.  Psychiatric: She has a normal mood and affect. Her behavior is normal. Judgment and thought content normal.          Assessment & Plan:

## 2017-05-05 MED FILL — PANTOPRAZOLE SOD DR 40 MG T: 40 | 90 days supply | Qty: 90 | Fill #1

## 2017-05-26 MED FILL — ALLOPURINOL 300 MG TABLET: 300 | 90 days supply | Qty: 90 | Fill #0

## 2017-05-26 MED FILL — DULoxetine HCL 60 MG CPEP: 60 | 90 days supply | Qty: 90 | Fill #0

## 2017-06-30 MED FILL — TRIAMTERENE/HCTZ 37.5/25 CP: 37.5-25 | 90 days supply | Qty: 90 | Fill #0

## 2017-07-31 ENCOUNTER — Other Ambulatory Visit: Payer: Self-pay | Admitting: Family

## 2017-07-31 MED FILL — PANTOPRAZOLE SOD DR 40 MG T: 40 | 30 days supply | Qty: 30 | Fill #0

## 2017-08-29 ENCOUNTER — Ambulatory Visit: Payer: Medicare Other | Admitting: Family

## 2017-08-29 ENCOUNTER — Telehealth: Payer: Self-pay | Admitting: Family

## 2017-08-29 NOTE — Telephone Encounter (Signed)
Pt called in to make provider aware that she will not be able to make her apt today because she just had a new grand baby last night. Pt rescheduled her apt to Tuesday   Should pt be charged?

## 2017-08-29 NOTE — Telephone Encounter (Signed)
No charge. 

## 2017-09-02 ENCOUNTER — Ambulatory Visit (INDEPENDENT_AMBULATORY_CARE_PROVIDER_SITE_OTHER): Payer: Medicare Other | Admitting: Family

## 2017-09-02 ENCOUNTER — Other Ambulatory Visit: Payer: Self-pay | Admitting: Family

## 2017-09-02 ENCOUNTER — Encounter: Payer: Self-pay | Admitting: Family

## 2017-09-02 VITALS — BP 136/90 | HR 93 | Temp 98.4°F | Resp 16 | Ht 63.0 in | Wt 283.0 lb

## 2017-09-02 DIAGNOSIS — K219 Gastro-esophageal reflux disease without esophagitis: Secondary | ICD-10-CM | POA: Diagnosis not present

## 2017-09-02 DIAGNOSIS — D229 Melanocytic nevi, unspecified: Secondary | ICD-10-CM

## 2017-09-02 DIAGNOSIS — Z23 Encounter for immunization: Secondary | ICD-10-CM

## 2017-09-02 DIAGNOSIS — I10 Essential (primary) hypertension: Secondary | ICD-10-CM | POA: Diagnosis not present

## 2017-09-02 DIAGNOSIS — R739 Hyperglycemia, unspecified: Secondary | ICD-10-CM

## 2017-09-02 DIAGNOSIS — M109 Gout, unspecified: Secondary | ICD-10-CM | POA: Diagnosis not present

## 2017-09-02 LAB — HEMOGLOBIN A1C: Hgb A1c MFr Bld: 6.5 % (ref 4.6–6.5)

## 2017-09-02 LAB — BASIC METABOLIC PANEL
BUN: 21 mg/dL (ref 6–23)
CALCIUM: 9.9 mg/dL (ref 8.4–10.5)
CO2: 30 mEq/L (ref 19–32)
Chloride: 102 mEq/L (ref 96–112)
Creatinine, Ser: 1.09 mg/dL (ref 0.40–1.20)
GFR: 53.22 mL/min — AB (ref >60.00)
Glucose, Bld: 123 mg/dL — ABNORMAL HIGH (ref 70–99)
POTASSIUM: 5.1 meq/L (ref 3.5–5.1)
SODIUM: 140 meq/L (ref 135–145)

## 2017-09-02 LAB — URIC ACID: Uric Acid, Serum: 6.6 mg/dL (ref 2.4–7.0)

## 2017-09-02 MED ORDER — DULOXETINE HCL 60 MG PO CPEP: ORAL_CAPSULE | ORAL | 1 refills | Status: DC

## 2017-09-02 MED ORDER — ALLOPURINOL 300 MG PO TABS: ORAL_TABLET | ORAL | 1 refills | Status: DC

## 2017-09-02 MED ORDER — PANTOPRAZOLE SODIUM 40 MG PO TBEC: 40.0000 mg | DELAYED_RELEASE_TABLET | Freq: Every day | ORAL | 1 refills | Status: DC

## 2017-09-02 MED ORDER — FUROSEMIDE 40 MG PO TABS: 40.0000 mg | ORAL_TABLET | Freq: Every day | ORAL | 1 refills | Status: DC | PRN

## 2017-09-02 MED ORDER — TRIAMTERENE-HCTZ 37.5-25 MG PO CAPS: 1.0000 | ORAL_CAPSULE | Freq: Every morning | ORAL | 1 refills | Status: DC

## 2017-09-02 MED FILL — DULoxetine HCL 60 MG CPEP: 60 | 90 days supply | Qty: 90 | Fill #0

## 2017-09-02 MED FILL — FUROSEMIDE 40 MG TAB: 40 | 90 days supply | Qty: 90 | Fill #0

## 2017-09-02 MED FILL — ALLOPURINOL 300 MG TABS: 300 | 90 days supply | Qty: 90 | Fill #0

## 2017-09-02 MED FILL — PANTOPRAZOLE SOD DR 40 MG T: 40 | 90 days supply | Qty: 90 | Fill #0

## 2017-09-02 NOTE — Patient Instructions (Signed)
Please complete lab work prior to leaving.   

## 2017-09-02 NOTE — Addendum Note (Signed)
Addended by: Kelle Darting A on: 09/02/2017 01:27 PM   Modules accepted: Orders

## 2017-09-02 NOTE — Progress Notes (Signed)
Subjective:    Patient ID: Jordan [name], female    DOB: 07-02-1951, 66 y.o.   MRN: [id number]  HPI   Jordan [name] is a 66 yr old female who presents today for follow up.  1) HTN- maintained on dyazide.  BP Readings from Last 3 Encounters:  09/02/17 136/90  02/26/17 (!) 139/92  01/07/17 124/72   2) Depression- maintained on cymbalta. She reports that her mood is good.  Lives with son and his wife.  Thie daughter is living with them and she has 2 children.    3) GERD- maintained on protonix. Reports that her symptoms are generally well controlled.    4) Gout-  Maintained on allopurinol. Denies recent gout flares.  Lab Results  Component Value Date   HGBA1C 6.4 02/26/2017   Wt Readings from Last 3 Encounters:  09/02/17 283 lb (128.4 kg)  02/26/17 279 lb (126.6 kg)  01/07/17 290 lb 3.2 oz (131.6 kg)      Review of Systems See HPI  Past Medical History:  Diagnosis Date  . Cellulitis and abscess of finger, unspecified 401.9  . Cellulitis and abscess of foot, except toes   . Depressive disorder, not elsewhere classified   . GERD (gastroesophageal reflux disease)   . Gout, unspecified   . Hyperglycemia 05/08/2011  . Hyperlipidemia    borderline- not on meds  . Insomnia   . Leg edema    chronic  . Obesity, unspecified   . Osteoarthritis   . Renal insufficiency, mild 05/08/2011  . Small bowel obstruction [facility])      Social History   Social History  . Marital status: Single    Spouse name: N/A  . Number of children: 2  . Years of education: N/A   Occupational History  .  [employer]   Social History Main Topics  . Smoking status: Former Smoker    Packs/day: 0.25    Types: Cigarettes    Quit date: 12/26/2014  . Smokeless tobacco: Never Used  . Alcohol use 0.0 oz/week     Comment: rare  . Drug use: No  . Sexual activity: No   Other Topics Concern  . Not on file   Social History Narrative   2 children (daughter and son) both local. 3 grandchildren, one  great grandchild   Works at ED front desk   Divorced (married x 30 years)       Past Surgical History:  Procedure Laterality Date  . ABDOMINAL HYSTERECTOMY  2002   infection- no history of cancer  . APPENDECTOMY  2002  . CHOLECYSTECTOMY  1977  . ECTOPIC PREGNANCY SURGERY  1971  . right shoulder rotator cuff repair  06-07-10  . TOTAL HIP ARTHROPLASTY Left 1998    Family History  Problem Relation Age of Onset  . Diabetes Mother   . Cancer Mother        colon/ pancreatic  . Colon cancer Mother 65  . Hypertension Father   . Alcohol abuse Father   . Cirrhosis Father   . Other Sister        Pyeoderma gangrenosis  . Obesity Daughter   . Diabetes Sister        type 2  . Kidney failure Sister   . Liver disease Sister   . Heart attack Brother   . Cirrhosis Sister        had NASH, died heart failure    No Known Allergies  Current Outpatient Prescriptions on File Prior to  Visit  Medication Sig Dispense Refill  . allopurinol (ZYLOPRIM) 300 MG tablet TAKE 1/2 TABLET BY MOUTH 2 TIMES A DAY 90 tablet 0  . aspirin EC 81 MG tablet Take 81 mg by mouth daily.    . DULoxetine (CYMBALTA) 60 MG capsule TAKE 1 CAPSULE (60 MG) BY MOUTH DAILY. 90 capsule 0  . furosemide (LASIX) 40 MG tablet Take 1 tablet (40 mg total) by mouth daily as needed. 90 tablet 0  . Multiple Vitamin (MULTIVITAMIN WITH MINERALS) TABS tablet Take 1 tablet by mouth daily.    . pantoprazole (PROTONIX) 40 MG tablet Take 1 tablet (40 mg total) by mouth daily. 30 tablet 0  . triamterene-hydrochlorothiazide (DYAZIDE) 37.5-25 MG capsule Take 1 each (1 capsule total) by mouth every morning. 90 capsule 0  . [DISCONTINUED] famotidine (PEPCID AC) 10 MG chewable tablet Chew 10 mg by mouth daily.       No current facility-administered medications on file prior to visit.     BP 136/90 (BP Location: Right Arm) Comment (Cuff Size): thigh  Pulse 93   Temp 98.4 F (36.9 C) (Oral)   Resp 16   Ht 5\' 3"  (1.6 m)   Wt 283 lb (128.4  kg)   SpO2 98%   BMI 50.13 kg/m       Objective:   Physical Exam  Constitutional: She is oriented to person, place, and time. She appears well-developed and well-nourished.  HENT:  Head: Normocephalic and atraumatic.  Cardiovascular: Normal rate, regular rhythm and normal heart sounds.   No murmur heard. Pulmonary/Chest: Effort normal and breath sounds normal. No respiratory distress. She has no wheezes.  Musculoskeletal: She exhibits no edema.  Neurological: She is alert and oriented to person, place, and time.  Psychiatric: She has a normal mood and affect. Her behavior is normal. Judgment and thought content normal.          Assessment & Plan:  Desires skin check- will refer to dermatology.Has a mole on her chest she would like evaluated.   Gout- stable on allupurinol, continue same, obtain uric acid level.  HTN- stable on current meds continue same,  Hyperglycemia- obtain A1C. Flu shot today.   gerd- stable on PPI, continue same.  Depression- stable on cymbalta, continue same.

## 2017-09-10 DIAGNOSIS — H2513 Age-related nuclear cataract, bilateral: Secondary | ICD-10-CM | POA: Diagnosis not present

## 2017-09-11 ENCOUNTER — Encounter: Payer: Self-pay | Admitting: Family

## 2017-09-22 DIAGNOSIS — L821 Other seborrheic keratosis: Secondary | ICD-10-CM | POA: Diagnosis not present

## 2017-09-22 DIAGNOSIS — B009 Herpesviral infection, unspecified: Secondary | ICD-10-CM | POA: Diagnosis not present

## 2017-09-22 DIAGNOSIS — L814 Other melanin hyperpigmentation: Secondary | ICD-10-CM | POA: Diagnosis not present

## 2017-09-23 MED FILL — valACYclovir HCL 1 GM TABS: 1 | 15 days supply | Qty: 30 | Fill #0

## 2017-10-07 MED FILL — TRIAMTERENE/HCTZ 37.5/25 CP: 37.5-25 | 90 days supply | Qty: 90 | Fill #0

## 2017-11-24 MED FILL — PANTOPRAZOLE SOD DR 40 MG T: 40 | 90 days supply | Qty: 90 | Fill #1

## 2017-12-15 MED FILL — ALLOPURINOL 300 MG TABS: 300 | 90 days supply | Qty: 90 | Fill #1

## 2017-12-15 MED FILL — DULoxetine HCL 60 MG CPEP: 60 | 90 days supply | Qty: 90 | Fill #1

## 2017-12-30 MED FILL — TRIAMTERENE/HCTZ 37.5/25 CP: 37.5-25 | 90 days supply | Qty: 90 | Fill #1

## 2018-02-16 ENCOUNTER — Other Ambulatory Visit: Payer: Self-pay | Admitting: Family

## 2018-02-17 MED FILL — PANTOPRAZOLE SOD DR 40 MG T: 40 | 90 days supply | Qty: 90 | Fill #0

## 2018-02-20 MED FILL — PHENTERMINE 37.5 MG TABLET: 37.5 | 8 days supply | Qty: 15 | Fill #0

## 2018-03-04 ENCOUNTER — Encounter: Payer: Self-pay | Admitting: Family

## 2018-03-04 ENCOUNTER — Ambulatory Visit (INDEPENDENT_AMBULATORY_CARE_PROVIDER_SITE_OTHER): Payer: Medicare Other | Admitting: Family

## 2018-03-04 ENCOUNTER — Ambulatory Visit: Payer: Medicare Other | Admitting: Family

## 2018-03-04 VITALS — BP 149/78 | HR 84 | Temp 98.3°F | Resp 16 | Ht 63.0 in | Wt 275.2 lb

## 2018-03-04 DIAGNOSIS — K219 Gastro-esophageal reflux disease without esophagitis: Secondary | ICD-10-CM

## 2018-03-04 DIAGNOSIS — E119 Type 2 diabetes mellitus without complications: Secondary | ICD-10-CM | POA: Diagnosis not present

## 2018-03-04 DIAGNOSIS — M109 Gout, unspecified: Secondary | ICD-10-CM | POA: Diagnosis not present

## 2018-03-04 DIAGNOSIS — I10 Essential (primary) hypertension: Secondary | ICD-10-CM | POA: Diagnosis not present

## 2018-03-04 LAB — BASIC METABOLIC PANEL
BUN: 26 mg/dL — AB (ref 6–23)
CALCIUM: 9.9 mg/dL (ref 8.4–10.5)
CO2: 27 mEq/L (ref 19–32)
CREATININE: 0.87 mg/dL (ref 0.40–1.20)
Chloride: 104 mEq/L (ref 96–112)
GFR: 68.93 mL/min (ref >60.00)
Glucose, Bld: 118 mg/dL — ABNORMAL HIGH (ref 70–99)
POTASSIUM: 3.6 meq/L (ref 3.5–5.1)
Sodium: 140 mEq/L (ref 135–145)

## 2018-03-04 LAB — HEMOGLOBIN A1C: Hgb A1c MFr Bld: 6.5 % (ref 4.6–6.5)

## 2018-03-04 NOTE — Patient Instructions (Signed)
Please complete lab work prior to leaving.   

## 2018-03-04 NOTE — Progress Notes (Signed)
Subjective:    Patient ID: Jordan [name], female    DOB: 02/22/1951, 67 y.o.   MRN: [id number]  HPI  Patient is a 67 yr old female who presents today for follow up.   HTN- Maintained on lasix and diazide.    BP Readings from Last 3 Encounters:  03/04/18 (!) 149/78  09/02/17 136/90  02/26/17 (!) 139/92   Gout- denies gout symptoms. Continues allopurinol.   Lab Results  Component Value Date   HGBA1C 6.5 09/02/2017   GERD- continues protonix. Symptoms controlled as long as she takes it.   Review of Systems    see HPI  Past Medical History:  Diagnosis Date  . Cellulitis and abscess of finger, unspecified 401.9  . Cellulitis and abscess of foot, except toes   . Depressive disorder, not elsewhere classified   . GERD (gastroesophageal reflux disease)   . Gout, unspecified   . Hyperglycemia 05/08/2011  . Hyperlipidemia    borderline- not on meds  . Insomnia   . Leg edema    chronic  . Obesity, unspecified   . Osteoarthritis   . Renal insufficiency, mild 05/08/2011  . Small bowel obstruction (HCC)      Social History   Socioeconomic History  . Marital status: Single    Spouse name: Not on file  . Number of children: 2  . Years of education: Not on file  . Highest education level: Not on file  Social Needs  . Financial resource strain: Not on file  . Food insecurity - worry: Not on file  . Food insecurity - inability: Not on file  . Transportation needs - medical: Not on file  . Transportation needs - non-medical: Not on file  Occupational History    Employer: [employer]  Tobacco Use  . Smoking status: Former Smoker    Packs/day: 0.25    Types: Cigarettes    Last attempt to quit: 12/26/2014    Years since quitting: 3.1  . Smokeless tobacco: Never Used  Substance and Sexual Activity  . Alcohol use: Yes    Alcohol/week: 0.0 oz    Comment: rare  . Drug use: No  . Sexual activity: No  Other Topics Concern  . Not on file  Social History Narrative   2  children (daughter and son) both local. 3 grandchildren, one great grandchild   Works at ED front desk   Divorced (married x 30 years)    Past Surgical History:  Procedure Laterality Date  . ABDOMINAL HYSTERECTOMY  2002   infection- no history of cancer  . APPENDECTOMY  2002  . CHOLECYSTECTOMY  1977  . ECTOPIC PREGNANCY SURGERY  1971  . right shoulder rotator cuff repair  06-07-10  . TOTAL HIP ARTHROPLASTY Left 1998    Family History  Problem Relation Age of Onset  . Diabetes Mother   . Cancer Mother        colon/ pancreatic  . Colon cancer Mother 26  . Hypertension Father   . Alcohol abuse Father   . Cirrhosis Father   . Other Sister        Pyeoderma gangrenosis  . Obesity Daughter   . Diabetes Sister        type 2  . Kidney failure Sister   . Liver disease Sister   . Heart attack Brother   . Cirrhosis Sister        had NASH, died heart failure    No Known Allergies  Current Outpatient Medications  on File Prior to Visit  Medication Sig Dispense Refill  . allopurinol (ZYLOPRIM) 300 MG tablet TAKE 1/2 TABLET BY MOUTH 2 TIMES A DAY 90 tablet 1  . aspirin EC 81 MG tablet Take 81 mg by mouth daily.    . DULoxetine (CYMBALTA) 60 MG capsule TAKE 1 CAPSULE (60 MG) BY MOUTH DAILY. 90 capsule 1  . furosemide (LASIX) 40 MG tablet Take 1 tablet (40 mg total) by mouth daily as needed. 90 tablet 1  . Multiple Vitamin (MULTIVITAMIN WITH MINERALS) TABS tablet Take 1 tablet by mouth daily.    . pantoprazole (PROTONIX) 40 MG tablet TAKE 1 TABLET (40 MG TOTAL) BY MOUTH DAILY. 90 tablet 1  . triamterene-hydrochlorothiazide (DYAZIDE) 37.5-25 MG capsule Take 1 each (1 capsule total) by mouth every morning. 90 capsule 1  . [DISCONTINUED] famotidine (PEPCID AC) 10 MG chewable tablet Chew 10 mg by mouth daily.       No current facility-administered medications on file prior to visit.     BP (!) 149/78 (BP Location: Right Arm, Patient Position: Sitting, Cuff Size: Large)   Pulse 84    Temp 98.3 F (36.8 C) (Oral)   Resp 16   Ht 5\' 3"  (1.6 m)   Wt 275 lb 3.2 oz (124.8 kg)   SpO2 99%   BMI 48.75 kg/m    Objective:   Physical Exam  Constitutional: She is oriented to person, place, and time. She appears well-developed and well-nourished.  Cardiovascular: Normal rate, regular rhythm and normal heart sounds.  No murmur heard. Pulmonary/Chest: Effort normal and breath sounds normal. No respiratory distress. She has no wheezes.  Musculoskeletal: She exhibits no edema.  Neurological: She is alert and oriented to person, place, and time.  Psychiatric: She has a normal mood and affect. Her behavior is normal. Judgment and thought content normal.          Assessment & Plan:  HTN- bp is acceptable for her age. Continue current meds and obtain follow up bmet.  GERD- stable on PPI, continue same.   Gout- stable on Allopurinol. Continue same.  DM2-  Lab Results  Component Value Date   HGBA1C 6.5 03/04/2018   A1C is at goal.  Continue diabetic diet, exercise, weight loss efforts.

## 2018-03-05 ENCOUNTER — Encounter: Payer: Self-pay | Admitting: Family

## 2018-03-19 MED FILL — PHENTERMINE 37.5 MG TABLET: 37.5 | 30 days supply | Qty: 30 | Fill #0

## 2018-03-23 ENCOUNTER — Other Ambulatory Visit: Payer: Self-pay | Admitting: Family

## 2018-03-24 MED FILL — ALLOPURINOL 300 MG TABS: 300 | 90 days supply | Qty: 90 | Fill #0

## 2018-03-24 MED FILL — TRIAMTERENE/HCTZ 37.5/25 CP: 37.5-25 | 90 days supply | Qty: 90 | Fill #0

## 2018-03-24 MED FILL — DULoxetine HCL 60 MG CPEP: 60 | 90 days supply | Qty: 90 | Fill #0

## 2018-04-13 ENCOUNTER — Encounter: Payer: Self-pay | Admitting: Family

## 2018-04-14 MED ORDER — SCOPOLAMINE 1 MG/3DAYS TD PT72: 1.0000 | MEDICATED_PATCH | TRANSDERMAL | 0 refills | Status: DC

## 2018-04-14 MED FILL — TRANSDERM-SCOP 1.5 MG/3 DAY: 1 | 9 days supply | Qty: 3 | Fill #0

## 2018-06-01 MED FILL — PANTOPRAZOLE SOD DR 40 MG T: 40 | 90 days supply | Qty: 90 | Fill #1

## 2018-06-05 ENCOUNTER — Encounter: Payer: Self-pay | Admitting: Family

## 2018-06-05 ENCOUNTER — Ambulatory Visit (INDEPENDENT_AMBULATORY_CARE_PROVIDER_SITE_OTHER): Payer: Medicare Other | Admitting: Family

## 2018-06-05 VITALS — BP 124/72 | HR 71 | Temp 98.0°F | Resp 18 | Wt 255.8 lb

## 2018-06-05 DIAGNOSIS — F329 Major depressive disorder, single episode, unspecified: Secondary | ICD-10-CM | POA: Diagnosis not present

## 2018-06-05 DIAGNOSIS — E119 Type 2 diabetes mellitus without complications: Secondary | ICD-10-CM | POA: Diagnosis not present

## 2018-06-05 DIAGNOSIS — I10 Essential (primary) hypertension: Secondary | ICD-10-CM | POA: Diagnosis not present

## 2018-06-05 DIAGNOSIS — F32A Depression, unspecified: Secondary | ICD-10-CM

## 2018-06-05 DIAGNOSIS — M109 Gout, unspecified: Secondary | ICD-10-CM | POA: Diagnosis not present

## 2018-06-05 MED ORDER — TRIAMTERENE-HCTZ 37.5-25 MG PO CAPS: 1.0000 | ORAL_CAPSULE | Freq: Every morning | ORAL | 1 refills | Status: DC

## 2018-06-05 MED ORDER — ALLOPURINOL 300 MG PO TABS
ORAL_TABLET | ORAL | 1 refills | Status: DC
Start: 2018-06-05 — End: 2019-04-06

## 2018-06-05 MED ORDER — DULOXETINE HCL 60 MG PO CPEP
ORAL_CAPSULE | ORAL | 1 refills | Status: DC
Start: 2018-06-05 — End: 2019-04-06

## 2018-06-05 MED ORDER — PANTOPRAZOLE SODIUM 40 MG PO TBEC
40.0000 mg | DELAYED_RELEASE_TABLET | Freq: Every day | ORAL | 1 refills | Status: DC
Start: 2018-06-05 — End: 2019-03-08

## 2018-06-05 NOTE — Progress Notes (Signed)
Subjective:    Patient ID: Jordan [name], female    DOB: [birth year], 67 y.o.   MRN: [id number]  HPI  Patient is a 67 yr old female who presents today for follow up.  HTN- maintained on dyazide, lasix prn swelling.  BP Readings from Last 3 Encounters:  06/05/18 124/72  03/04/18 (!) 149/78  09/02/17 136/90   DM2- she has been working really hard on diet and has lost 20 pounds since her last visit.   Lab Results  Component Value Date   HGBA1C 6.5 03/04/2018   HGBA1C 6.5 09/02/2017   HGBA1C 6.4 02/26/2017   Lab Results  Component Value Date   LDLCALC 77 08/11/2015   CREATININE 0.87 03/04/2018   Depression- maintained on cymbalta. Sleeps well. Reports good mood, denies anxiety.   Wt Readings from Last 3 Encounters:  06/05/18 255 lb 12.8 oz (116 kg)  03/04/18 275 lb 3.2 oz (124.8 kg)  09/02/17 283 lb (128.4 kg)   Gout- continues allopurinol, denies recent gout flares.   Review of Systems    see HPI  Past Medical History:  Diagnosis Date  . Cellulitis and abscess of finger, unspecified 401.9  . Cellulitis and abscess of foot, except toes   . Depressive disorder, not elsewhere classified   . GERD (gastroesophageal reflux disease)   . Gout, unspecified   . Hyperglycemia 05/08/2011  . Hyperlipidemia    borderline- not on meds  . Insomnia   . Leg edema    chronic  . Obesity, unspecified   . Osteoarthritis   . Renal insufficiency, mild 05/08/2011  . Small bowel obstruction (HCC)      Social History   Socioeconomic History  . Marital status: Single    Spouse name: Not on file  . Number of children: 2  . Years of education: Not on file  . Highest education level: Not on file  Occupational History    Employer: [employer]  . Financial resource strain: Not on file  . Food insecurity:    Worry: Not on file    Inability: Not on file  . Transportation needs:    Medical: Not on file    Non-medical: Not on file  Tobacco Use  . Smoking status:  Former Smoker    Packs/day: 0.25    Types: Cigarettes    Last attempt to quit: 12/26/2014    Years since quitting: 3.4  . Smokeless tobacco: Never Used  Substance and Sexual Activity  . Alcohol use: Yes    Alcohol/week: 0.0 oz    Comment: rare  . Drug use: No  . Sexual activity: Never  Lifestyle  . Physical activity:    Days per week: Not on file    Minutes per session: Not on file  . Stress: Not on file  Relationships  . Social connections:    Talks on phone: Not on file    Gets together: Not on file    Attends religious service: Not on file    Active member of club or organization: Not on file    Attends meetings of clubs or organizations: Not on file    Relationship status: Not on file  . Intimate partner violence:    Fear of current or ex partner: Not on file    Emotionally abused: Not on file    Physically abused: Not on file    Forced sexual activity: Not on file  Other Topics Concern  . Not on file  Social  History Narrative   2 children (daughter and son) both local. 3 grandchildren, one great grandchild   Works at ED front desk   Divorced (married x 30 years)    Past Surgical History:  Procedure Laterality Date  . ABDOMINAL HYSTERECTOMY  2002   infection- no history of cancer  . APPENDECTOMY  2002  . CHOLECYSTECTOMY  1977  . ECTOPIC PREGNANCY SURGERY  1971  . right shoulder rotator cuff repair  06-07-10  . TOTAL HIP ARTHROPLASTY Left 1998    Family History  Problem Relation Age of Onset  . Diabetes Mother   . Cancer Mother        colon/ pancreatic  . Colon cancer Mother 57  . Hypertension Father   . Alcohol abuse Father   . Cirrhosis Father   . Other Sister        Pyeoderma gangrenosis  . Obesity Daughter   . Diabetes Sister        type 2  . Kidney failure Sister   . Liver disease Sister   . Heart attack Brother   . Cirrhosis Sister        had NASH, died heart failure    No Known Allergies  Current Outpatient Medications on File Prior to  Visit  Medication Sig Dispense Refill  . allopurinol (ZYLOPRIM) 300 MG tablet TAKE 1/2 TABLET BY MOUTH 2 TIMES A DAY 90 tablet 1  . aspirin EC 81 MG tablet Take 81 mg by mouth daily.    . DULoxetine (CYMBALTA) 60 MG capsule TAKE 1 CAPSULE (60 MG) BY MOUTH DAILY. 90 capsule 1  . furosemide (LASIX) 40 MG tablet Take 1 tablet (40 mg total) by mouth daily as needed. 90 tablet 1  . Multiple Vitamin (MULTIVITAMIN WITH MINERALS) TABS tablet Take 1 tablet by mouth daily.    . pantoprazole (PROTONIX) 40 MG tablet TAKE 1 TABLET (40 MG TOTAL) BY MOUTH DAILY. 90 tablet 1  . scopolamine (TRANSDERM-SCOP, 1.5 MG,) 1 MG/3DAYS Place 1 patch (1.5 mg total) onto the skin every 3 (three) days. 3 patch 0  . triamterene-hydrochlorothiazide (DYAZIDE) 37.5-25 MG capsule TAKE 1 CAPSULE BY MOUTH EVERY MORNING. 90 capsule 1  . [DISCONTINUED] famotidine (PEPCID AC) 10 MG chewable tablet Chew 10 mg by mouth daily.       No current facility-administered medications on file prior to visit.     BP 124/72 (BP Location: Left Arm, Patient Position: Sitting, Cuff Size: Large)   Pulse 71   Temp 98 F (36.7 C) (Oral)   Resp 18   Wt 255 lb 12.8 oz (116 kg)   SpO2 97%   BMI 45.31 kg/m    Objective:   Physical Exam  Constitutional: She is oriented to person, place, and time. She appears well-developed and well-nourished.  Cardiovascular: Normal rate, regular rhythm and normal heart sounds.  No murmur heard. Pulmonary/Chest: Effort normal and breath sounds normal. No respiratory distress. She has no wheezes.  Neurological: She is alert and oriented to person, place, and time.  Skin: Skin is warm and dry.  Psychiatric: She has a normal mood and affect. Her behavior is normal. Judgment and thought content normal.          Assessment & Plan:  HTN- bp stable on current meds. Continue same.  Diabetes type 2- diet controlled. Commended her on her weight loss and encouraged her to continue her efforts.  Gout- stable on  allopurinol, continue same.   Depression- stable on cymbalta, continue same.

## 2018-06-05 NOTE — Patient Instructions (Signed)
Please complete lab work prior to leaving. Great job on weight loss- keep up the good work.

## 2018-06-09 ENCOUNTER — Telehealth: Payer: Self-pay | Admitting: *Deleted

## 2018-06-09 DIAGNOSIS — E119 Type 2 diabetes mellitus without complications: Secondary | ICD-10-CM

## 2018-06-09 NOTE — Telephone Encounter (Signed)
Called and spoke with the patient on (06/08/18) informing her that we received a call from the lab informing us that there was a mix up with the tubes of blood and her labs will need to be redrawn.  Informed the patient that she can come in at her convenience, but she will need to call and schedule a lab appointment.  The patient verbalized understanding and agreed.  Future labs ordered and sent.//AB/CMA

## 2018-06-10 ENCOUNTER — Other Ambulatory Visit (INDEPENDENT_AMBULATORY_CARE_PROVIDER_SITE_OTHER): Payer: Medicare Other

## 2018-06-10 DIAGNOSIS — E119 Type 2 diabetes mellitus without complications: Secondary | ICD-10-CM

## 2018-06-10 LAB — LIPID PANEL
CHOLESTEROL: 167 mg/dL (ref 0–200)
HDL: 44.3 mg/dL (ref >39.00)
LDL CALC: 93 mg/dL (ref 0–99)
NonHDL: 123.05
Total CHOL/HDL Ratio: 4
Triglycerides: 151 mg/dL — ABNORMAL HIGH (ref 0.0–149.0)
VLDL: 30.2 mg/dL (ref 0.0–40.0)

## 2018-06-10 LAB — BASIC METABOLIC PANEL
BUN: 22 mg/dL (ref 6–23)
CHLORIDE: 106 meq/L (ref 96–112)
CO2: 27 meq/L (ref 19–32)
CREATININE: 0.96 mg/dL (ref 0.40–1.20)
Calcium: 9.8 mg/dL (ref 8.4–10.5)
GFR: 61.48 mL/min (ref >60.00)
GLUCOSE: 117 mg/dL — AB (ref 70–99)
Potassium: 4.4 mEq/L (ref 3.5–5.1)
Sodium: 142 mEq/L (ref 135–145)

## 2018-06-10 LAB — MICROALBUMIN / CREATININE URINE RATIO
Creatinine,U: 15.4 mg/dL
MICROALB/CREAT RATIO: 4.5 mg/g (ref 0.0–30.0)
Microalb, Ur: <0.7 mg/dL (ref 0.0–1.9)

## 2018-06-10 LAB — HEMOGLOBIN A1C: HEMOGLOBIN A1C: 6 % (ref 4.6–6.5)

## 2018-06-19 MED FILL — valACYclovir HCL 1 GM TABS: 1 | 15 days supply | Qty: 30 | Fill #1

## 2018-06-22 MED FILL — ALLOPURINOL 300 MG TABS: 300 | 90 days supply | Qty: 90 | Fill #1

## 2018-06-22 MED FILL — TRIAMTERENE/HCTZ 37.5/25 CP: 37.5-25 | 90 days supply | Qty: 90 | Fill #1

## 2018-06-22 MED FILL — FUROSEMIDE 40 MG TAB: 40 | 90 days supply | Qty: 90 | Fill #1

## 2018-06-22 MED FILL — DULoxetine HCL 60 MG CPEP: 60 | 90 days supply | Qty: 90 | Fill #1

## 2018-08-21 MED FILL — PANTOPRAZOLE SOD DR 40 MG T: 40 | 90 days supply | Qty: 90 | Fill #0

## 2018-08-28 MED FILL — PHENTERMINE 37.5 MG TABLET: 37.5 | 30 days supply | Qty: 30 | Fill #0

## 2018-09-07 ENCOUNTER — Ambulatory Visit (INDEPENDENT_AMBULATORY_CARE_PROVIDER_SITE_OTHER): Payer: Medicare Other | Admitting: Family

## 2018-09-07 ENCOUNTER — Encounter: Payer: Self-pay | Admitting: Family

## 2018-09-07 VITALS — BP 128/80 | HR 81 | Temp 98.4°F | Resp 18 | Ht 63.0 in | Wt 251.6 lb

## 2018-09-07 DIAGNOSIS — Z23 Encounter for immunization: Secondary | ICD-10-CM | POA: Diagnosis not present

## 2018-09-07 DIAGNOSIS — I10 Essential (primary) hypertension: Secondary | ICD-10-CM

## 2018-09-07 DIAGNOSIS — F32A Depression, unspecified: Secondary | ICD-10-CM

## 2018-09-07 DIAGNOSIS — K219 Gastro-esophageal reflux disease without esophagitis: Secondary | ICD-10-CM | POA: Diagnosis not present

## 2018-09-07 DIAGNOSIS — F329 Major depressive disorder, single episode, unspecified: Secondary | ICD-10-CM | POA: Diagnosis not present

## 2018-09-07 DIAGNOSIS — E119 Type 2 diabetes mellitus without complications: Secondary | ICD-10-CM

## 2018-09-07 DIAGNOSIS — M109 Gout, unspecified: Secondary | ICD-10-CM

## 2018-09-07 NOTE — Progress Notes (Deleted)
Subjective:   Jordan [name] is a 67 y.o. female who presents for an Initial Medicare Annual Wellness Visit.  Review of Systems   No ROS.  Medicare Wellness Visit. Additional risk factors are reflected in the social history.   Sleep patterns:  Home Safety/Smoke Alarms: Feels safe in home. Smoke alarms in place.     Female:   Pap-  hysterectomy Mammo-       Dexa scan-        CCS-due 12/2018    Objective:    There were no vitals filed for this visit. There is no height or weight on file to calculate BMI.  Advanced Directives 01/07/2017 07/28/2016 07/27/2016  Does Patient Have a Medical Advance Directive? Yes No No  Does patient want to make changes to medical advance directive? No - Patient declined - -  Would patient like information on creating a medical advance directive? - No - patient declined information Yes - Educational materials given    Current Medications (verified) Outpatient Encounter Medications as of 09/08/2018  Medication Sig  . allopurinol (ZYLOPRIM) 300 MG tablet TAKE 1/2 TABLET BY MOUTH 2 TIMES A DAY  . aspirin EC 81 MG tablet Take 81 mg by mouth daily.  . DULoxetine (CYMBALTA) 60 MG capsule Take 1 capsule po daily  . furosemide (LASIX) 40 MG tablet Take 1 tablet (40 mg total) by mouth daily as needed.  . Multiple Vitamin (MULTIVITAMIN WITH MINERALS) TABS tablet Take 1 tablet by mouth daily.  . pantoprazole (PROTONIX) 40 MG tablet Take 1 tablet (40 mg total) by mouth daily.  Marland Kitchen triamterene-hydrochlorothiazide (DYAZIDE) 37.5-25 MG capsule Take 1 each (1 capsule total) by mouth every morning.  . [DISCONTINUED] famotidine (PEPCID AC) 10 MG chewable tablet Chew 10 mg by mouth daily.     No facility-administered encounter medications on file as of 09/08/2018.     Allergies (verified) Patient has no known allergies.   History: Past Medical History:  Diagnosis Date  . Cellulitis and abscess of finger, unspecified 401.9  . Cellulitis and abscess of foot, except  toes   . Depressive disorder, not elsewhere classified   . GERD (gastroesophageal reflux disease)   . Gout, unspecified   . Hyperglycemia 05/08/2011  . Hyperlipidemia    borderline- not on meds  . Insomnia   . Leg edema    chronic  . Obesity, unspecified   . Osteoarthritis   . Renal insufficiency, mild 05/08/2011  . Small bowel obstruction [facility])    Past Surgical History:  Procedure Laterality Date  . ABDOMINAL HYSTERECTOMY  2002   infection- no history of cancer  . APPENDECTOMY  2002  . CHOLECYSTECTOMY  1977  . ECTOPIC PREGNANCY SURGERY  1971  . right shoulder rotator cuff repair  06-07-10  . TOTAL HIP ARTHROPLASTY Left 1998   Family History  Problem Relation Age of Onset  . Diabetes Mother   . Cancer Mother        colon/ pancreatic  . Colon cancer Mother 70  . Hypertension Father   . Alcohol abuse Father   . Cirrhosis Father   . Other Sister        Pyeoderma gangrenosis  . Obesity Daughter   . Diabetes Sister        type 2  . Kidney failure Sister   . Liver disease Sister   . Heart attack Brother   . Cirrhosis Sister        had NASH, died heart failure   Social  History   Socioeconomic History  . Marital status: Single    Spouse name: Not on file  . Number of children: 2  . Years of education: Not on file  . Highest education level: Not on file  Occupational History    Employer: [employer]  . Financial resource strain: Not on file  . Food insecurity:    Worry: Not on file    Inability: Not on file  . Transportation needs:    Medical: Not on file    Non-medical: Not on file  Tobacco Use  . Smoking status: Current Some Day Smoker    Packs/day: 0.25    Types: Cigarettes    Last attempt to quit: 12/26/2014    Years since quitting: 3.7  . Smokeless tobacco: Never Used  Substance and Sexual Activity  . Alcohol use: Yes    Alcohol/week: 0.0 standard drinks    Comment: rare  . Drug use: No  . Sexual activity: Never  Lifestyle  . Physical  activity:    Days per week: Not on file    Minutes per session: Not on file  . Stress: Not on file  Relationships  . Social connections:    Talks on phone: Not on file    Gets together: Not on file    Attends religious service: Not on file    Active member of club or organization: Not on file    Attends meetings of clubs or organizations: Not on file    Relationship status: Not on file  Other Topics Concern  . Not on file  Social History Narrative   2 children (daughter and son) both local. 3 grandchildren, one great grandchild   Works at ED front desk   Divorced (married x 30 years)    Tobacco Counseling Ready to quit: Not Answered Counseling given: Not Answered   Clinical Intake:                        Activities of Daily Living No flowsheet data found.   Immunizations and Health Maintenance Immunization History  Administered Date(s) Administered  . Influenza Whole 10/12/2009  . Influenza, High Dose Seasonal PF 09/02/2017, 09/07/2018  . Influenza-Unspecified 09/21/2013, 08/23/2014, 09/22/2016  . Pneumococcal Conjugate-13 09/04/2016  . Pneumococcal Polysaccharide-23 12/23/2013  . Td 12/23/2004  . Tdap 08/11/2015  . Zoster 06/19/2011   Health Maintenance Due  Topic Date Due  . FOOT EXAM  09/22/1960  . INFLUENZA VACCINE  07/23/2018    Patient Care Team: [name], NP as PCP - General  Indicate any recent Medical Services you may have received from other than [facility] providers in the past year (date may be approximate).     Assessment:   This is a routine wellness examination for Sady. Physical assessment deferred to PCP.  Hearing/Vision screen No exam data present  Dietary issues and exercise activities discussed:   Diet (meal preparation, eat out, water intake, caffeinated beverages, dairy products, fruits and vegetables): {Desc; diets:16563} Breakfast: Lunch:  Dinner:      Goals    . Have 3 meals a day    . Increase  physical activity    . Reduce portion size      Depression Screen PHQ 2/9 Scores 09/07/2018 03/04/2018 09/02/2017 01/07/2017 11/29/2015  PHQ - 2 Score 0 - 1 1 1   PHQ- 9 Score 0 - 4 - -  Exception Documentation - Patient refusal - - -    Fall Risk  Fall Risk  03/04/2018 01/07/2017 11/29/2015  Falls in the past year? No Yes No  Number falls in past yr: - 2 or more -  Injury with Fall? - No -  Risk for fall due to : Other (Comment) - -   Cognitive Function: MMSE - Mini Mental State Exam 01/07/2017  Orientation to time 5  Orientation to Place 5  Registration 3  Attention/ Calculation 5  Recall 2  Language- name 2 objects 2  Language- repeat 1  Language- follow 3 step command 3  Language- read & follow direction 1  Write a sentence 1  Copy design 1  Total score 29        Screening Tests Health Maintenance  Topic Date Due  . FOOT EXAM  09/22/1960  . INFLUENZA VACCINE  07/23/2018  . OPHTHALMOLOGY EXAM  11/07/2018 (Originally 08/23/2016)  . HEMOGLOBIN A1C  12/10/2018  . PNA vac Low Risk Adult (2 of 2 - PPSV23) 12/23/2018  . COLONOSCOPY  01/14/2019  . MAMMOGRAM  01/20/2019  . TETANUS/TDAP  08/10/2025  . DEXA SCAN  Completed  . Hepatitis C Screening  Completed     Plan:   ***  I have personally reviewed and noted the following in the patient's chart:   . Medical and social history . Use of alcohol, tobacco or illicit drugs  . Current medications and supplements . Functional ability and status . Nutritional status . Physical activity . Advanced directives . List of other physicians . Hospitalizations, surgeries, and ER visits in previous 12 months . Vitals . Screenings to include cognitive, depression, and falls . Referrals and appointments  In addition, I have reviewed and discussed with patient certain preventive protocols, quality metrics, and best practice recommendations. A written personalized care plan for preventive services as well as general preventive health  recommendations were provided to patient.     Shela Nevin, [place]   09/07/2018

## 2018-09-07 NOTE — Progress Notes (Signed)
Subjective:    Patient ID: Jordan [name], female    DOB: 02/05/1951, 67 y.o.   MRN: [id number]  HPI   Jordan [name] is a 67 yr old female who presents today for follow up.    Gout- maintained on allopurinol.   HTN- maintained on dyazide.  BP Readings from Last 3 Encounters:  09/07/18 128/80  06/05/18 124/72  03/04/18 (!) 149/78   Dm2- diet controlled.  Lab Results  Component Value Date   HGBA1C 6.0 06/10/2018   HGBA1C 6.5 03/04/2018   HGBA1C 6.5 09/02/2017   Lab Results  Component Value Date   MICROALBUR <0.7 06/10/2018   LDLCALC 93 06/10/2018   CREATININE 0.96 06/10/2018   GERD- stable on protonix.  Reports that her symptoms are stable.    Depression- continues cymbalta. Reports good mood.    Review of Systems See HPI  Past Medical History:  Diagnosis Date  . Cellulitis and abscess of finger, unspecified 401.9  . Cellulitis and abscess of foot, except toes   . Depressive disorder, not elsewhere classified   . GERD (gastroesophageal reflux disease)   . Gout, unspecified   . Hyperglycemia 05/08/2011  . Hyperlipidemia    borderline- not on meds  . Insomnia   . Leg edema    chronic  . Obesity, unspecified   . Osteoarthritis   . Renal insufficiency, mild 05/08/2011  . Small bowel obstruction (HCC)      Social History   Socioeconomic History  . Marital status: Single    Spouse name: Not on file  . Number of children: 2  . Years of education: Not on file  . Highest education level: Not on file  Occupational History    Employer: [employer]  . Financial resource strain: Not on file  . Food insecurity:    Worry: Not on file    Inability: Not on file  . Transportation needs:    Medical: Not on file    Non-medical: Not on file  Tobacco Use  . Smoking status: Current Some Day Smoker    Packs/day: 0.25    Types: Cigarettes    Last attempt to quit: 12/26/2014    Years since quitting: 3.7  . Smokeless tobacco: Never Used  Substance and  Sexual Activity  . Alcohol use: Yes    Alcohol/week: 0.0 standard drinks    Comment: rare  . Drug use: No  . Sexual activity: Never  Lifestyle  . Physical activity:    Days per week: Not on file    Minutes per session: Not on file  . Stress: Not on file  Relationships  . Social connections:    Talks on phone: Not on file    Gets together: Not on file    Attends religious service: Not on file    Active member of club or organization: Not on file    Attends meetings of clubs or organizations: Not on file    Relationship status: Not on file  . Intimate partner violence:    Fear of current or ex partner: Not on file    Emotionally abused: Not on file    Physically abused: Not on file    Forced sexual activity: Not on file  Other Topics Concern  . Not on file  Social History Narrative   2 children (daughter and son) both local. 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 ABDOMINAL HYSTERECTOMY  2002   infection- no history of cancer  . APPENDECTOMY  2002  . CHOLECYSTECTOMY  1977  . ECTOPIC PREGNANCY SURGERY  1971  . right shoulder rotator cuff repair  06-07-10  . TOTAL HIP ARTHROPLASTY Left 1998    Family History  Problem Relation Age of Onset  . Diabetes Mother   . Cancer Mother        colon/ pancreatic  . Colon cancer Mother 39  . Hypertension Father   . Alcohol abuse Father   . Cirrhosis Father   . Other Sister        Pyeoderma gangrenosis  . Obesity Daughter   . Diabetes Sister        type 2  . Kidney failure Sister   . Liver disease Sister   . Heart attack Brother   . Cirrhosis Sister        had NASH, died heart failure    No Known Allergies  Current Outpatient Medications on File Prior to Visit  Medication Sig Dispense Refill  . allopurinol (ZYLOPRIM) 300 MG tablet TAKE 1/2 TABLET BY MOUTH 2 TIMES A DAY 90 tablet 1  . aspirin EC 81 MG tablet  Take 81 mg by mouth daily.    . DULoxetine (CYMBALTA) 60 MG capsule Take 1 capsule po daily 90 capsule 1  . furosemide (LASIX) 40 MG tablet Take 1 tablet (40 mg total) by mouth daily as needed. 90 tablet 1  . Multiple Vitamin (MULTIVITAMIN WITH MINERALS) TABS tablet Take 1 tablet by mouth daily.    . pantoprazole (PROTONIX) 40 MG tablet Take 1 tablet (40 mg total) by mouth daily. 90 tablet 1  . triamterene-hydrochlorothiazide (DYAZIDE) 37.5-25 MG capsule Take 1 each (1 capsule total) by mouth every morning. 90 capsule 1  . [DISCONTINUED] famotidine (PEPCID AC) 10 MG chewable tablet Chew 10 mg by mouth daily.       No current facility-administered medications on file prior to visit.     BP 128/80 (BP Location: Right Arm, Cuff Size: Large)   Pulse 81   Temp 98.4 F (36.9 C) (Oral)   Resp 18   Ht 5\' 3"  (1.6 m)   Wt 251 lb 9.6 oz (114.1 kg)   SpO2 97%   BMI 44.57 kg/m       Objective:   Physical Exam  Constitutional: She is oriented to person, place, and time. She appears well-developed and well-nourished.  Cardiovascular: Normal rate, regular rhythm and normal heart sounds.  No murmur heard. Pulmonary/Chest: Effort normal and breath sounds normal. No respiratory distress. She has no wheezes.  Neurological: She is alert and oriented to person, place, and time.  Skin: Skin is warm and dry.  Psychiatric: She has a normal mood and affect. Her behavior is normal. Judgment and thought content normal.          Assessment & Plan:  Gout- reports stable on allopurinol with no recent flares.  Continue same.  Hypertension-pressure is stable on Dyazide, continue same.  GERD-stable on proton pump inhibitor.  Continue same.  Depression-stable on Cymbalta.  Continue same.  Diabetes type 2-we are a few days early for follow-up A1c.  She will return to the lab next week to complete lab work.

## 2018-09-07 NOTE — Patient Instructions (Addendum)
Please schedule lab visit next week.

## 2018-09-08 ENCOUNTER — Ambulatory Visit: Payer: Medicare Other | Admitting: *Deleted

## 2018-09-16 ENCOUNTER — Other Ambulatory Visit (INDEPENDENT_AMBULATORY_CARE_PROVIDER_SITE_OTHER): Payer: Medicare Other

## 2018-09-16 DIAGNOSIS — E119 Type 2 diabetes mellitus without complications: Secondary | ICD-10-CM | POA: Diagnosis not present

## 2018-09-17 LAB — BASIC METABOLIC PANEL
BUN: 17 mg/dL (ref 6–23)
CO2: 25 mEq/L (ref 19–32)
Calcium: 9.2 mg/dL (ref 8.4–10.5)
Chloride: 103 mEq/L (ref 96–112)
Creatinine, Ser: 0.99 mg/dL (ref 0.40–1.20)
GFR: 59.29 mL/min — AB (ref >60.00)
Glucose, Bld: 99 mg/dL (ref 70–99)
POTASSIUM: 3.5 meq/L (ref 3.5–5.1)
SODIUM: 140 meq/L (ref 135–145)

## 2018-09-17 LAB — HEMOGLOBIN A1C: HEMOGLOBIN A1C: 6.2 % (ref 4.6–6.5)

## 2018-09-22 MED FILL — ALLOPURINOL 300 MG TABLET: 300 | 90 days supply | Qty: 90 | Fill #0

## 2018-09-22 MED FILL — TRIAMTERENE/HCTZ 37.5/25 CP: 37.5-25 | 90 days supply | Qty: 90 | Fill #0

## 2018-09-22 MED FILL — DULoxetine HCL 60 MG CPEP: 60 | 90 days supply | Qty: 90 | Fill #0

## 2018-10-15 NOTE — Progress Notes (Signed)
Subjective:   Jordan [name] is a 67 y.o. female who presents for Medicare Annual (Subsequent) preventive examination.  Pt still works PRN as registration down stairs.  Review of Systems: No ROS.  Medicare Wellness Visit. Additional risk factors are reflected in the social history. Cardiac Risk Factors include: advanced age (>48men, >35 women);dyslipidemia;hypertension;smoking/ tobacco exposure;obesity (BMI >30kg/m2) Sleep patterns: No issues Home Safety/Smoke Alarms: Feels safe in home. Smoke alarms in place. Lives alone in 2 story home.   Female:   Pap- hysterectomy      Mammo- ordered     Dexa scan- ordered       CCS-next due 12/2018    Objective:     Vitals: BP 140/86 (BP Location: Left Arm, Patient Position: Sitting, Cuff Size: Normal)   Pulse 69   Ht 5\' 3"  (1.6 m)   Wt 250 lb (113.4 kg)   SpO2 97%   BMI 44.29 kg/m   Body mass index is 44.29 kg/m.  Advanced Directives 10/16/2018 01/07/2017 07/28/2016 07/27/2016  Does Patient Have a Medical Advance Directive? Yes Yes No No  Type of Paramedic of [place];Living will - - -  Does patient want to make changes to medical advance directive? - No - Patient declined - -  Copy of [place] in Chart? Yes - - -  Would patient like information on creating a medical advance directive? - - No - patient declined information Yes - Educational materials given    Tobacco Social History   Tobacco Use  Smoking Status Current Some Day Smoker  . Packs/day: 0.25  . Types: Cigarettes  . Last attempt to quit: 12/26/2014  . Years since quitting: 3.8  Smokeless Tobacco Never Used     Ready to quit: Not Answered Counseling given: Not Answered   Clinical Intake:     Pain : No/denies pain                 Past Medical History:  Diagnosis Date  . Cellulitis and abscess of finger, unspecified 401.9  . Cellulitis and abscess of foot, except toes   . Depressive disorder, not elsewhere  classified   . GERD (gastroesophageal reflux disease)   . Gout, unspecified   . Hyperglycemia 05/08/2011  . Hyperlipidemia    borderline- not on meds  . Insomnia   . Leg edema    chronic  . Obesity, unspecified   . Osteoarthritis   . Renal insufficiency, mild 05/08/2011  . Small bowel obstruction [facility])    Past Surgical History:  Procedure Laterality Date  . ABDOMINAL HYSTERECTOMY  2002   infection- no history of cancer  . APPENDECTOMY  2002  . CHOLECYSTECTOMY  1977  . ECTOPIC PREGNANCY SURGERY  1971  . right shoulder rotator cuff repair  06-07-10  . TOTAL HIP ARTHROPLASTY Left 1998   Family History  Problem Relation Age of Onset  . Diabetes Mother   . Cancer Mother        colon/ pancreatic  . Colon cancer Mother 60  . Hypertension Father   . Alcohol abuse Father   . Cirrhosis Father   . Other Sister        Pyeoderma gangrenosis  . Obesity Daughter   . Diabetes Sister        type 2  . Kidney failure Sister   . Liver disease Sister   . Heart attack Brother   . Cirrhosis Sister        had NASH, died  heart failure   Social History   Socioeconomic History  . Marital status: Single    Spouse name: Not on file  . Number of children: 2  . Years of education: Not on file  . Highest education level: Not on file  Occupational History    Employer: [employer]  . Financial resource strain: Not on file  . Food insecurity:    Worry: Not on file    Inability: Not on file  . Transportation needs:    Medical: Not on file    Non-medical: Not on file  Tobacco Use  . Smoking status: Current Some Day Smoker    Packs/day: 0.25    Types: Cigarettes    Last attempt to quit: 12/26/2014    Years since quitting: 3.8  . Smokeless tobacco: Never Used  Substance and Sexual Activity  . Alcohol use: Yes    Alcohol/week: 0.0 standard drinks    Comment: rare  . Drug use: No  . Sexual activity: Never  Lifestyle  . Physical activity:    Days per week: Not on file     Minutes per session: Not on file  . Stress: Not on file  Relationships  . Social connections:    Talks on phone: Not on file    Gets together: Not on file    Attends religious service: Not on file    Active member of club or organization: Not on file    Attends meetings of clubs or organizations: Not on file    Relationship status: Not on file  Other Topics Concern  . Not on file  Social History Narrative   2 children (daughter and son) both local. 3 grandchildren, one great grandchild   Works at ED front desk   Divorced (married x 30 years)    Outpatient Encounter Medications as of 10/16/2018  Medication Sig  . allopurinol (ZYLOPRIM) 300 MG tablet TAKE 1/2 TABLET BY MOUTH 2 TIMES A DAY  . aspirin EC 81 MG tablet Take 81 mg by mouth daily.  . DULoxetine (CYMBALTA) 60 MG capsule Take 1 capsule po daily  . furosemide (LASIX) 40 MG tablet Take 1 tablet (40 mg total) by mouth daily as needed.  . Multiple Vitamin (MULTIVITAMIN WITH MINERALS) TABS tablet Take 1 tablet by mouth daily.  . pantoprazole (PROTONIX) 40 MG tablet Take 1 tablet (40 mg total) by mouth daily.  Marland Kitchen triamterene-hydrochlorothiazide (DYAZIDE) 37.5-25 MG capsule Take 1 each (1 capsule total) by mouth every morning. (Patient taking differently: Take 1 capsule by mouth every morning. )  . [DISCONTINUED] famotidine (PEPCID AC) 10 MG chewable tablet Chew 10 mg by mouth daily.     No facility-administered encounter medications on file as of 10/16/2018.     Activities of Daily Living In your present state of health, do you have any difficulty performing the following activities: 10/16/2018  Hearing? N  Vision? N  Difficulty concentrating or making decisions? N  Walking or climbing stairs? N  Dressing or bathing? N  Doing errands, shopping? N  Preparing Food and eating ? N  Using the Toilet? N  In the past six months, have you accidently leaked urine? N  Do you have problems with loss of bowel control? N  Managing your  Medications? N  Managing your Finances? N  Housekeeping or managing your Housekeeping? N  Some recent data might be hidden    Patient Care Team: [name], NP as PCP - General    Assessment:  This is a routine wellness examination for Jordan [name]. Physical assessment deferred to PCP.  Exercise Activities and Dietary recommendations Current Exercise Habits: The patient does not participate in regular exercise at present, Exercise limited by: None identified Diet (meal preparation, eat out, water intake, caffeinated beverages, dairy products, fruits and vegetables): well balanced   Goals    . Have 3 meals a day    . Increase physical activity    . Reduce portion size       Fall Risk Fall Risk  10/16/2018 03/04/2018 01/07/2017 11/29/2015  Falls in the past year? No No Yes No  Number falls in past yr: - - 2 or more -  Injury with Fall? - - No -  Risk for fall due to : - Other (Comment) - -   Depression Screen PHQ 2/9 Scores 10/16/2018 09/07/2018 03/04/2018 09/02/2017  PHQ - 2 Score 0 0 - 1  PHQ- 9 Score - 0 - 4  Exception Documentation - - Patient refusal -     Cognitive Function Ad8 score reviewed for issues:  Issues making decisions:no  Less interest in hobbies / activities:no  Repeats questions, stories (family complaining):no  Trouble using ordinary gadgets (microwave, computer, phone):no  Forgets the month or year: no  Mismanaging finances: no  Remembering appts:no  Daily problems with thinking and/or memory:no Ad8 score is=0     MMSE - Mini Mental State Exam 01/07/2017  Orientation to time 5  Orientation to Place 5  Registration 3  Attention/ Calculation 5  Recall 2  Language- name 2 objects 2  Language- repeat 1  Language- follow 3 step command 3  Language- read & follow direction 1  Write a sentence 1  Copy design 1  Total score 29        Immunization History  Administered Date(s) Administered  . Influenza Whole 10/12/2009  .  Influenza, High Dose Seasonal PF 09/02/2017, 09/07/2018  . Influenza-Unspecified 09/21/2013, 08/23/2014, 09/22/2016  . Pneumococcal Conjugate-13 09/04/2016  . Pneumococcal Polysaccharide-23 12/23/2013  . Td 12/23/2004  . Tdap 08/11/2015  . Zoster 06/19/2011   Screening Tests Health Maintenance  Topic Date Due  . OPHTHALMOLOGY EXAM  11/07/2018 (Originally 08/23/2016)  . PNA vac Low Risk Adult (2 of 2 - PPSV23) 12/23/2018  . COLONOSCOPY  01/14/2019  . MAMMOGRAM  01/20/2019  . HEMOGLOBIN A1C  03/17/2019  . TETANUS/TDAP  08/10/2025  . INFLUENZA VACCINE  Completed  . DEXA SCAN  Completed  . Hepatitis C Screening  Completed  . FOOT EXAM  Discontinued      Plan:    Please schedule your next medicare wellness visit with me in 1 yr.  Continue to eat heart healthy diet (full of fruits, vegetables, whole grains, lean protein, water--limit salt, fat, and sugar intake) and increase physical activity as tolerated.  I have ordered your mammogram and bone density scan. Please schedule.  Bring a copy of your living will and/or healthcare power of attorney to your next office visit.    I have personally reviewed and noted the following in the patient's chart:   . Medical and social history . Use of alcohol, tobacco or illicit drugs  . Current medications and supplements . Functional ability and status . Nutritional status . Physical activity . Advanced directives . List of other physicians . Hospitalizations, surgeries, and ER visits in previous 12 months . Vitals . Screenings to include cognitive, depression, and falls . Referrals and appointments  In addition, I have reviewed and discussed with patient  certain preventive protocols, quality metrics, and best practice recommendations. A written personalized care plan for preventive services as well as general preventive health recommendations were provided to patient.     Shela Nevin, [place]  10/16/2018

## 2018-10-16 ENCOUNTER — Other Ambulatory Visit: Payer: Self-pay | Admitting: Family

## 2018-10-16 ENCOUNTER — Encounter: Payer: Self-pay | Admitting: *Deleted

## 2018-10-16 ENCOUNTER — Ambulatory Visit (INDEPENDENT_AMBULATORY_CARE_PROVIDER_SITE_OTHER): Payer: Medicare Other | Admitting: *Deleted

## 2018-10-16 VITALS — BP 140/86 | HR 69 | Ht 63.0 in | Wt 250.0 lb

## 2018-10-16 DIAGNOSIS — Z780 Asymptomatic menopausal state: Secondary | ICD-10-CM

## 2018-10-16 DIAGNOSIS — Z1231 Encounter for screening mammogram for malignant neoplasm of breast: Secondary | ICD-10-CM

## 2018-10-16 DIAGNOSIS — Z0000 Encounter for general adult medical examination without abnormal findings: Secondary | ICD-10-CM

## 2018-10-16 DIAGNOSIS — Z1239 Encounter for other screening for malignant neoplasm of breast: Secondary | ICD-10-CM

## 2018-10-16 NOTE — Progress Notes (Signed)
RN note reviewed and agree.  

## 2018-10-16 NOTE — Patient Instructions (Signed)
Please schedule your next medicare wellness visit with me in 1 yr.  Continue to eat heart healthy diet (full of fruits, vegetables, whole grains, lean protein, water--limit salt, fat, and sugar intake) and increase physical activity as tolerated.  I have ordered your mammogram and bone density scan. Please schedule.  Bring a copy of your living will and/or healthcare power of attorney to your next office visit.   Jordan [name] , Thank you for taking time to come for your Medicare Wellness Visit. I appreciate your ongoing commitment to your health goals. Please review the following plan we discussed and let me know if I can assist you in the future.   These are the goals we discussed: Goals    . Have 3 meals a day    . Increase physical activity    . Reduce portion size       This is a list of the screening recommended for you and due dates:  Health Maintenance  Topic Date Due  . Eye exam for diabetics  11/07/2018*  . Pneumonia vaccines (2 of 2 - PPSV23) 12/23/2018  . Colon Cancer Screening  01/14/2019  . Mammogram  01/20/2019  . Hemoglobin A1C  03/17/2019  . Tetanus Vaccine  08/10/2025  . Flu Shot  Completed  . DEXA scan (bone density measurement)  Completed  .  Hepatitis C: One time screening is recommended by Center for Disease Control  (CDC) for  adults born from [birth year] through 1966.   Completed  . Complete foot exam   Discontinued  *Topic was postponed. The date shown is not the original due date.    Health Maintenance for Postmenopausal Women Menopause is a normal process in which your reproductive ability comes to an end. This process happens gradually over a span of months to years, usually between the ages of 71 and 52. Menopause is complete when you have missed 12 consecutive menstrual periods. It is important to talk with your health care provider about some of the most common conditions that affect postmenopausal women, such as heart disease, cancer, and bone loss  (osteoporosis). Adopting a healthy lifestyle and getting preventive care can help to promote your health and wellness. Those actions can also lower your chances of developing some of these common conditions. What should I know about menopause? During menopause, you may experience a number of symptoms, such as:  Moderate-to-severe hot flashes.  Night sweats.  Decrease in sex drive.  Mood swings.  Headaches.  Tiredness.  Irritability.  Memory problems.  Insomnia.  Choosing to treat or not to treat menopausal changes is an individual decision that you make with your health care provider. What should I know about hormone replacement therapy and supplements? Hormone therapy products are effective for treating symptoms that are associated with menopause, such as hot flashes and night sweats. Hormone replacement carries certain risks, especially as you become older. If you are thinking about using estrogen or estrogen with progestin treatments, discuss the benefits and risks with your health care provider. What should I know about heart disease and stroke? Heart disease, heart attack, and stroke become more likely as you age. This may be due, in part, to the hormonal changes that your body experiences during menopause. These can affect how your body processes dietary fats, triglycerides, and cholesterol. Heart attack and stroke are both medical emergencies. There are many things that you can do to help prevent heart disease and stroke:  Have your blood pressure checked at least every 1-2  years. High blood pressure causes heart disease and increases the risk of stroke.  If you are 71-28 years old, ask your health care provider if you should take aspirin to prevent a heart attack or a stroke.  Do not use any tobacco products, including cigarettes, chewing tobacco, or electronic cigarettes. If you need help quitting, ask your health care provider.  It is important to eat a healthy diet and  maintain a healthy weight. ? Be sure to include plenty of vegetables, fruits, low-fat dairy products, and lean protein. ? Avoid eating foods that are high in solid fats, added sugars, or salt (sodium).  Get regular exercise. This is one of the most important things that you can do for your health. ? Try to exercise for at least 150 minutes each week. The type of exercise that you do should increase your heart rate and make you sweat. This is known as moderate-intensity exercise. ? Try to do strengthening exercises at least twice each week. Do these in addition to the moderate-intensity exercise.  Know your numbers.Ask your health care provider to check your cholesterol and your blood glucose. Continue to have your blood tested as directed by your health care provider.  What should I know about cancer screening? There are several types of cancer. Take the following steps to reduce your risk and to catch any cancer development as early as possible. Breast Cancer  Practice breast self-awareness. ? This means understanding how your breasts normally appear and feel. ? It also means doing regular breast self-exams. Let your health care provider know about any changes, no matter how small.  If you are 63 or older, have a clinician do a breast exam (clinical breast exam or CBE) every year. Depending on your age, family history, and medical history, it may be recommended that you also have a yearly breast X-ray (mammogram).  If you have a family history of breast cancer, talk with your health care provider about genetic screening.  If you are at high risk for breast cancer, talk with your health care provider about having an MRI and a mammogram every year.  Breast cancer (BRCA) gene test is recommended for women who have family members with BRCA-related cancers. Results of the assessment will determine the need for genetic counseling and BRCA1 and for BRCA2 testing. BRCA-related cancers include these  types: ? Breast. This occurs in males or females. ? Ovarian. ? Tubal. This may also be called fallopian tube cancer. ? Cancer of the abdominal or pelvic lining (peritoneal cancer). ? Prostate. ? Pancreatic.  Cervical, Uterine, and Ovarian Cancer Your health care provider may recommend that you be screened regularly for cancer of the pelvic organs. These include your ovaries, uterus, and vagina. This screening involves a pelvic exam, which includes checking for microscopic changes to the surface of your cervix (Pap test).  For women ages 21-65, health care providers may recommend a pelvic exam and a Pap test every three years. For women ages 10-65, they may recommend the Pap test and pelvic exam, combined with testing for human papilloma virus (HPV), every five years. Some types of HPV increase your risk of cervical cancer. Testing for HPV may also be done on women of any age who have unclear Pap test results.  Other health care providers may not recommend any screening for nonpregnant women who are considered low risk for pelvic cancer and have no symptoms. Ask your health care provider if a screening pelvic exam is right for you.  If you have had past treatment for cervical cancer or a condition that could lead to cancer, you need Pap tests and screening for cancer for at least 20 years after your treatment. If Pap tests have been discontinued for you, your risk factors (such as having a new sexual partner) need to be reassessed to determine if you should start having screenings again. Some women have medical problems that increase the chance of getting cervical cancer. In these cases, your health care provider may recommend that you have screening and Pap tests more often.  If you have a family history of uterine cancer or ovarian cancer, talk with your health care provider about genetic screening.  If you have vaginal bleeding after reaching menopause, tell your health care provider.  There  are currently no reliable tests available to screen for ovarian cancer.  Lung Cancer Lung cancer screening is recommended for adults 53-58 years old who are at high risk for lung cancer because of a history of smoking. A yearly low-dose CT scan of the lungs is recommended if you:  Currently smoke.  Have a history of at least 30 pack-years of smoking and you currently smoke or have quit within the past 15 years. A pack-year is smoking an average of one pack of cigarettes per day for one year.  Yearly screening should:  Continue until it has been 15 years since you quit.  Stop if you develop a health problem that would prevent you from having lung cancer treatment.  Colorectal Cancer  This type of cancer can be detected and can often be prevented.  Routine colorectal cancer screening usually begins at age 75 and continues through age 108.  If you have risk factors for colon cancer, your health care provider may recommend that you be screened at an earlier age.  If you have a family history of colorectal cancer, talk with your health care provider about genetic screening.  Your health care provider may also recommend using home test kits to check for hidden blood in your stool.  A small camera at the end of a tube can be used to examine your colon directly (sigmoidoscopy or colonoscopy). This is done to check for the earliest forms of colorectal cancer.  Direct examination of the colon should be repeated every 5-10 years until age 16. However, if early forms of precancerous polyps or small growths are found or if you have a family history or genetic risk for colorectal cancer, you may need to be screened more often.  Skin Cancer  Check your skin from head to toe regularly.  Monitor any moles. Be sure to tell your health care provider: ? About any new moles or changes in moles, especially if there is a change in a mole's shape or color. ? If you have a mole that is larger than the  size of a pencil eraser.  If any of your family members has a history of skin cancer, especially at a young age, talk with your health care provider about genetic screening.  Always use sunscreen. Apply sunscreen liberally and repeatedly throughout the day.  Whenever you are outside, protect yourself by wearing long sleeves, pants, a wide-brimmed hat, and sunglasses.  What should I know about osteoporosis? Osteoporosis is a condition in which bone destruction happens more quickly than new bone creation. After menopause, you may be at an increased risk for osteoporosis. To help prevent osteoporosis or the bone fractures that can happen because of osteoporosis, the following is  recommended:  If you are 59-70 years old, get at least 1,000 mg of calcium and at least 600 mg of vitamin D per day.  If you are older than age 24 but younger than age 53, get at least 1,200 mg of calcium and at least 600 mg of vitamin D per day.  If you are older than age 88, get at least 1,200 mg of calcium and at least 800 mg of vitamin D per day.  Smoking and excessive alcohol intake increase the risk of osteoporosis. Eat foods that are rich in calcium and vitamin D, and do weight-bearing exercises several times each week as directed by your health care provider. What should I know about how menopause affects my mental health? Depression may occur at any age, but it is more common as you become older. Common symptoms of depression include:  Low or sad mood.  Changes in sleep patterns.  Changes in appetite or eating patterns.  Feeling an overall lack of motivation or enjoyment of activities that you previously enjoyed.  Frequent crying spells.  Talk with your health care provider if you think that you are experiencing depression. What should I know about immunizations? It is important that you get and maintain your immunizations. These include:  Tetanus, diphtheria, and pertussis (Tdap) booster  vaccine.  Influenza every year before the flu season begins.  Pneumonia vaccine.  Shingles vaccine.  Your health care provider may also recommend other immunizations. This information is not intended to replace advice given to you by your health care provider. Make sure you discuss any questions you have with your health care provider. Document Released: 01/31/2006 Document Revised: 06/28/2016 Document Reviewed: 09/12/2015 Elsevier Interactive Patient Education  2018 [employer].

## 2018-10-21 ENCOUNTER — Ambulatory Visit (HOSPITAL_BASED_OUTPATIENT_CLINIC_OR_DEPARTMENT_OTHER)
Admission: RE | Admit: 2018-10-21 | Discharge: 2018-10-21 | Disposition: A | Discharge status: Home / Self Care | Payer: Medicare Other | Source: Ambulatory Visit | Attending: Family | Admitting: Family

## 2018-10-21 DIAGNOSIS — Z13820 Encounter for screening for osteoporosis: Secondary | ICD-10-CM | POA: Diagnosis not present

## 2018-10-21 DIAGNOSIS — Z1231 Encounter for screening mammogram for malignant neoplasm of breast: Secondary | ICD-10-CM | POA: Insufficient documentation

## 2018-10-21 DIAGNOSIS — Z780 Asymptomatic menopausal state: Secondary | ICD-10-CM | POA: Insufficient documentation

## 2018-11-09 MED FILL — PHENTERMINE 37.5 MG TABLET: 37.5 | 30 days supply | Qty: 30 | Fill #0

## 2018-12-01 ENCOUNTER — Encounter (HOSPITAL_BASED_OUTPATIENT_CLINIC_OR_DEPARTMENT_OTHER): Payer: Self-pay | Admitting: Emergency Medicine

## 2018-12-01 ENCOUNTER — Emergency Department (HOSPITAL_BASED_OUTPATIENT_CLINIC_OR_DEPARTMENT_OTHER): Payer: Medicare Other

## 2018-12-01 ENCOUNTER — Ambulatory Visit: Payer: Medicare Other | Admitting: Family

## 2018-12-01 ENCOUNTER — Inpatient Hospital Stay (HOSPITAL_COMMUNITY): Payer: Medicare Other

## 2018-12-01 ENCOUNTER — Inpatient Hospital Stay (HOSPITAL_BASED_OUTPATIENT_CLINIC_OR_DEPARTMENT_OTHER)
Admission: EM | Admit: 2018-12-01 | Discharge: 2018-12-07 | DRG: 389 | Disposition: A | Discharge status: Home / Self Care | Payer: Medicare Other | Attending: Internal Medicine | Admitting: Internal Medicine

## 2018-12-01 ENCOUNTER — Other Ambulatory Visit: Payer: Self-pay

## 2018-12-01 DIAGNOSIS — E162 Hypoglycemia, unspecified: Secondary | ICD-10-CM | POA: Diagnosis not present

## 2018-12-01 DIAGNOSIS — K56600 Partial intestinal obstruction, unspecified as to cause: Secondary | ICD-10-CM

## 2018-12-01 DIAGNOSIS — E860 Dehydration: Secondary | ICD-10-CM | POA: Diagnosis present

## 2018-12-01 DIAGNOSIS — K219 Gastro-esophageal reflux disease without esophagitis: Secondary | ICD-10-CM | POA: Diagnosis present

## 2018-12-01 DIAGNOSIS — E785 Hyperlipidemia, unspecified: Secondary | ICD-10-CM | POA: Diagnosis present

## 2018-12-01 DIAGNOSIS — K5651 Intestinal adhesions [bands], with partial obstruction: Secondary | ICD-10-CM | POA: Diagnosis present

## 2018-12-01 DIAGNOSIS — Z720 Tobacco use: Secondary | ICD-10-CM | POA: Diagnosis present

## 2018-12-01 DIAGNOSIS — Z79899 Other long term (current) drug therapy: Secondary | ICD-10-CM

## 2018-12-01 DIAGNOSIS — F32A Depression, unspecified: Secondary | ICD-10-CM | POA: Diagnosis present

## 2018-12-01 DIAGNOSIS — R739 Hyperglycemia, unspecified: Secondary | ICD-10-CM | POA: Diagnosis not present

## 2018-12-01 DIAGNOSIS — R112 Nausea with vomiting, unspecified: Secondary | ICD-10-CM | POA: Diagnosis not present

## 2018-12-01 DIAGNOSIS — Z6841 Body Mass Index (BMI) 40.0 and over, adult: Secondary | ICD-10-CM | POA: Diagnosis not present

## 2018-12-01 DIAGNOSIS — F329 Major depressive disorder, single episode, unspecified: Secondary | ICD-10-CM | POA: Diagnosis present

## 2018-12-01 DIAGNOSIS — Z96642 Presence of left artificial hip joint: Secondary | ICD-10-CM | POA: Diagnosis present

## 2018-12-01 DIAGNOSIS — I10 Essential (primary) hypertension: Secondary | ICD-10-CM | POA: Diagnosis present

## 2018-12-01 DIAGNOSIS — Z0289 Encounter for other administrative examinations: Secondary | ICD-10-CM

## 2018-12-01 DIAGNOSIS — N3090 Cystitis, unspecified without hematuria: Secondary | ICD-10-CM | POA: Diagnosis present

## 2018-12-01 DIAGNOSIS — K56609 Unspecified intestinal obstruction, unspecified as to partial versus complete obstruction: Secondary | ICD-10-CM | POA: Diagnosis not present

## 2018-12-01 DIAGNOSIS — E876 Hypokalemia: Secondary | ICD-10-CM

## 2018-12-01 DIAGNOSIS — Z87891 Personal history of nicotine dependence: Secondary | ICD-10-CM

## 2018-12-01 DIAGNOSIS — Z4682 Encounter for fitting and adjustment of non-vascular catheter: Secondary | ICD-10-CM | POA: Diagnosis not present

## 2018-12-01 DIAGNOSIS — F17210 Nicotine dependence, cigarettes, uncomplicated: Secondary | ICD-10-CM | POA: Diagnosis present

## 2018-12-01 DIAGNOSIS — Z7982 Long term (current) use of aspirin: Secondary | ICD-10-CM | POA: Diagnosis not present

## 2018-12-01 DIAGNOSIS — E66813 Obesity, class 3: Secondary | ICD-10-CM

## 2018-12-01 DIAGNOSIS — R1010 Upper abdominal pain, unspecified: Secondary | ICD-10-CM | POA: Diagnosis not present

## 2018-12-01 HISTORY — DX: Personal history of nicotine dependence: Z87.891

## 2018-12-01 HISTORY — DX: Unspecified intestinal obstruction, unspecified as to partial versus complete obstruction: K56.609

## 2018-12-01 LAB — COMPREHENSIVE METABOLIC PANEL
ALT: 38 U/L (ref 0–44)
AST: 27 U/L (ref 15–41)
Albumin: 4.5 g/dL (ref 3.5–5.0)
Alkaline Phosphatase: 102 U/L (ref 38–126)
Anion gap: 12 (ref 5–15)
BUN: 17 mg/dL (ref 8–23)
CO2: 23 mmol/L (ref 22–32)
Calcium: 9.6 mg/dL (ref 8.9–10.3)
Chloride: 108 mmol/L (ref 98–111)
Creatinine, Ser: 0.81 mg/dL (ref 0.44–1.00)
GFR calc Af Amer: >60 mL/min (ref >60)
GFR calc non Af Amer: >60 mL/min (ref >60)
GLUCOSE: 148 mg/dL — AB (ref 70–99)
Potassium: 3.1 mmol/L — ABNORMAL LOW (ref 3.5–5.1)
Sodium: 143 mmol/L (ref 135–145)
TOTAL PROTEIN: 7.2 g/dL (ref 6.5–8.1)
Total Bilirubin: 0.6 mg/dL (ref 0.3–1.2)

## 2018-12-01 LAB — CBC
HCT: 46.5 % — ABNORMAL HIGH (ref 36.0–46.0)
Hemoglobin: 15.2 g/dL — ABNORMAL HIGH (ref 12.0–15.0)
MCH: 30.9 pg (ref 26.0–34.0)
MCHC: 32.7 g/dL (ref 30.0–36.0)
MCV: 94.5 fL (ref 80.0–100.0)
Platelets: 232 10*3/uL (ref 150–400)
RBC: 4.92 MIL/uL (ref 3.87–5.11)
RDW: 13.8 % (ref 11.5–15.5)
WBC: 12.1 10*3/uL — ABNORMAL HIGH (ref 4.0–10.5)
nRBC: 0 % (ref 0.0–0.2)

## 2018-12-01 LAB — I-STAT CHEM 8, ED
BUN: 16 mg/dL (ref 8–23)
CHLORIDE: 107 mmol/L (ref 98–111)
Calcium, Ion: 1.23 mmol/L (ref 1.15–1.40)
Creatinine, Ser: 0.9 mg/dL (ref 0.44–1.00)
Glucose, Bld: 147 mg/dL — ABNORMAL HIGH (ref 70–99)
HCT: 48 % — ABNORMAL HIGH (ref 36.0–46.0)
Hemoglobin: 16.3 g/dL — ABNORMAL HIGH (ref 12.0–15.0)
Potassium: 3.1 mmol/L — ABNORMAL LOW (ref 3.5–5.1)
Sodium: 144 mmol/L (ref 135–145)
TCO2: 25 mmol/L (ref 22–32)

## 2018-12-01 LAB — URINALYSIS, ROUTINE W REFLEX MICROSCOPIC
Bilirubin Urine: NEGATIVE
Glucose, UA: NEGATIVE mg/dL
Hgb urine dipstick: NEGATIVE
Ketones, ur: NEGATIVE mg/dL
NITRITE: POSITIVE — AB
Protein, ur: NEGATIVE mg/dL
Specific Gravity, Urine: 1.01 (ref 1.005–1.030)
pH: 6 (ref 5.0–8.0)

## 2018-12-01 LAB — PHOSPHORUS: Phosphorus: 3.5 mg/dL (ref 2.5–4.6)

## 2018-12-01 LAB — GLUCOSE, CAPILLARY: Glucose-Capillary: 97 mg/dL (ref 70–99)

## 2018-12-01 LAB — I-STAT CG4 LACTIC ACID, ED: Lactic Acid, Venous: 1.41 mmol/L (ref 0.5–1.9)

## 2018-12-01 LAB — LIPASE, BLOOD: Lipase: 31 U/L (ref 11–51)

## 2018-12-01 LAB — CBG MONITORING, ED: GLUCOSE-CAPILLARY: 102 mg/dL — AB (ref 70–99)

## 2018-12-01 LAB — MAGNESIUM: Magnesium: 1.7 mg/dL (ref 1.7–2.4)

## 2018-12-01 LAB — URINALYSIS, MICROSCOPIC (REFLEX)

## 2018-12-01 MED ORDER — MORPHINE SULFATE (PF) 2 MG/ML IV SOLN
2.0000 mg | INTRAVENOUS | Status: DC | PRN
  Administered 2018-12-01: 2 mg via INTRAVENOUS
  Filled 2018-12-01: qty 1

## 2018-12-01 MED ORDER — SODIUM CHLORIDE 0.9 % IV SOLN
INTRAVENOUS | Status: DC
  Administered 2018-12-01: 12:00:00 via INTRAVENOUS

## 2018-12-01 MED ORDER — ONDANSETRON HCL 4 MG PO TABS: 4.0000 mg | ORAL_TABLET | Freq: Four times a day (QID) | ORAL | Status: DC | PRN

## 2018-12-01 MED ORDER — ONDANSETRON HCL 4 MG/2ML IJ SOLN
INTRAMUSCULAR | Status: AC
  Filled 2018-12-01: qty 2

## 2018-12-01 MED ORDER — MORPHINE SULFATE (PF) 4 MG/ML IV SOLN
4.0000 mg | Freq: Once | INTRAVENOUS | Status: AC
  Administered 2018-12-01: 4 mg via INTRAVENOUS

## 2018-12-01 MED ORDER — SODIUM CHLORIDE 0.9 % IV BOLUS
1000.0000 mL | Freq: Once | INTRAVENOUS | Status: AC
  Administered 2018-12-01: 1000 mL via INTRAVENOUS

## 2018-12-01 MED ORDER — ACETAMINOPHEN 325 MG PO TABS: 650.0000 mg | ORAL_TABLET | Freq: Four times a day (QID) | ORAL | Status: DC | PRN

## 2018-12-01 MED ORDER — ACETAMINOPHEN 650 MG RE SUPP: 650.0000 mg | Freq: Four times a day (QID) | RECTAL | Status: DC | PRN

## 2018-12-01 MED ORDER — LIDOCAINE HCL 4 % EX SOLN
CUTANEOUS | Status: AC
  Filled 2018-12-01: qty 50

## 2018-12-01 MED ORDER — PANTOPRAZOLE SODIUM 40 MG IV SOLR
40.0000 mg | INTRAVENOUS | Status: DC
  Administered 2018-12-01 – 2018-12-06 (×6): 40 mg via INTRAVENOUS
  Filled 2018-12-01 (×6): qty 40

## 2018-12-01 MED ORDER — METOPROLOL TARTRATE 5 MG/5ML IV SOLN
5.0000 mg | Freq: Three times a day (TID) | INTRAVENOUS | Status: DC
  Administered 2018-12-01 – 2018-12-06 (×16): 5 mg via INTRAVENOUS
  Filled 2018-12-01 (×16): qty 5

## 2018-12-01 MED ORDER — POTASSIUM CHLORIDE IN NACL 40-0.9 MEQ/L-% IV SOLN
INTRAVENOUS | Status: DC
  Administered 2018-12-01 – 2018-12-03 (×6): 100 mL/h via INTRAVENOUS
  Filled 2018-12-01 (×7): qty 1000

## 2018-12-01 MED ORDER — MORPHINE SULFATE (PF) 4 MG/ML IV SOLN
INTRAVENOUS | Status: AC
  Filled 2018-12-01: qty 1

## 2018-12-01 MED ORDER — DIATRIZOATE MEGLUMINE & SODIUM 66-10 % PO SOLN
90.0000 mL | Freq: Once | ORAL | Status: AC
  Administered 2018-12-01: 90 mL via NASOGASTRIC
  Filled 2018-12-01: qty 90

## 2018-12-01 MED ORDER — HEPARIN SODIUM (PORCINE) 5000 UNIT/ML IJ SOLN
5000.0000 [IU] | Freq: Three times a day (TID) | INTRAMUSCULAR | Status: DC
  Administered 2018-12-01 – 2018-12-07 (×17): 5000 [IU] via SUBCUTANEOUS
  Filled 2018-12-01 (×17): qty 1

## 2018-12-01 MED ORDER — POTASSIUM CHLORIDE 10 MEQ/100ML IV SOLN
10.0000 meq | INTRAVENOUS | Status: AC
  Administered 2018-12-01 (×2): 10 meq via INTRAVENOUS
  Filled 2018-12-01 (×2): qty 100

## 2018-12-01 MED ORDER — IOPAMIDOL (ISOVUE-300) INJECTION 61%
100.0000 mL | Freq: Once | INTRAVENOUS | Status: AC | PRN
  Administered 2018-12-01: 100 mL via INTRAVENOUS

## 2018-12-01 MED ORDER — SODIUM CHLORIDE 0.9 % IV SOLN
Freq: Once | INTRAVENOUS | Status: AC
  Administered 2018-12-01: 1000 mL via INTRAVENOUS

## 2018-12-01 MED ORDER — POTASSIUM CHLORIDE 10 MEQ/100ML IV SOLN
10.0000 meq | INTRAVENOUS | Status: AC
  Administered 2018-12-01 (×3): 10 meq via INTRAVENOUS
  Filled 2018-12-01 (×3): qty 100

## 2018-12-01 MED ORDER — ONDANSETRON HCL 4 MG/2ML IJ SOLN
4.0000 mg | Freq: Once | INTRAMUSCULAR | Status: AC | PRN
  Administered 2018-12-01: 4 mg via INTRAVENOUS
  Filled 2018-12-01: qty 2

## 2018-12-01 MED ORDER — SODIUM CHLORIDE 0.9 % IV SOLN
INTRAVENOUS | Status: DC
  Filled 2018-12-01: qty 1000

## 2018-12-01 MED ORDER — ONDANSETRON HCL 4 MG/2ML IJ SOLN
4.0000 mg | Freq: Once | INTRAMUSCULAR | Status: AC
  Administered 2018-12-01: 4 mg via INTRAVENOUS

## 2018-12-01 MED ORDER — ONDANSETRON HCL 4 MG/2ML IJ SOLN
4.0000 mg | Freq: Four times a day (QID) | INTRAMUSCULAR | Status: DC | PRN
  Administered 2018-12-01 – 2018-12-02 (×2): 4 mg via INTRAVENOUS
  Filled 2018-12-01 (×2): qty 2

## 2018-12-01 NOTE — Consult Note (Signed)
Jordan [name] 12-Mar-1951  [id number].    Requesting MD: Dr. Isla Pence Chief Complaint/Reason for Consult: SBO  HPI:  This is a 67 yo white female with a history of HTN, HLD, hyperglycemia (not on meds), tobacco abuse, obesity, and previous SBO in 2017.  She has a history of multiple abdominal operations after an open cholecystectomy with dropped stones and choledocholithiasis.  Ultimately after several operations she was referred to [facility] for definitive surgery to correct her CBD.  She has a history of an SBO in 2017 that resolved with conservative management.    She began to develop what she thought was gas on Sunday after eating [place] after church.  She had a good BM that day as well with no nausea.  On Monday it seemed to resolve in the morning, but then began to worsening in the afternoon.  She developed belching with nausea.  She felt distended and stopped passing flatus or stool.  She presented to [facility] last night with these complaints.  She had a CT scan that revealed a SBO.  She then actually began to vomit once but then also had a diarrhea like BM this morning.  She had an NGT placed and was transferred here to [facility]  She denies any other symptoms or complaints.  ROS: ROS: Please see HPI, otherwise all other systems have been reviewed and are negative.  Family History  Problem Relation Age of Onset  . Diabetes Mother   . Cancer Mother        colon/ pancreatic  . Colon cancer Mother 48  . Hypertension Father   . Alcohol abuse Father   . Cirrhosis Father   . Other Sister        Pyeoderma gangrenosis  . Obesity Daughter   . Diabetes Sister        type 2  . Kidney failure Sister   . Liver disease Sister   . Heart attack Brother   . Cirrhosis Sister        had NASH, died heart failure    Past Medical History:  Diagnosis Date  . Cellulitis and abscess of finger, unspecified 401.9  . Cellulitis and abscess of foot, except toes   . Depressive  disorder, not elsewhere classified   . GERD (gastroesophageal reflux disease)   . Gout, unspecified   . Hyperglycemia 05/08/2011  . Hyperlipidemia    borderline- not on meds  . Insomnia   . Leg edema    chronic  . Obesity, unspecified   . Osteoarthritis   . Renal insufficiency, mild 05/08/2011  . Small bowel obstruction [facility])     Past Surgical History:  Procedure Laterality Date  . ABDOMINAL HYSTERECTOMY  2002   infection- no history of cancer  . APPENDECTOMY  2002  . CHOLECYSTECTOMY  1977  . ECTOPIC PREGNANCY SURGERY  1971  . right shoulder rotator cuff repair  06-07-10  . TOTAL HIP ARTHROPLASTY Left 1998    Social History:  reports that she has been smoking cigarettes. She has been smoking about 0.25 packs per day. She has never used smokeless tobacco. She reports that she drinks alcohol. She reports that she does not use drugs.  Allergies: No Known Allergies   (Not in a hospital admission)   Physical Exam: Blood pressure (!) 119/103, pulse 73, temperature 97.6 F (36.4 C), resp. rate 16, height 5\' 4"  (1.626 m), weight 110.7 kg, SpO2 95 %. General: pleasant, obese white female who  is laying in bed in NAD HEENT: head is normocephalic, atraumatic.  Sclera are noninjected.  PERRL.  Ears and nose without any masses or lesions.  Mouth is pink.  NGT in place with watered down slightly orange tinged NGT output.   Heart: regular, rate, and rhythm.  Normal s1,s2. No obvious murmurs, gallops, or rubs noted.  Palpable radial and pedal pulses bilaterally Lungs: CTAB, no wheezes, rhonchi, or rales noted.  Respiratory effort nonlabored Abd: soft, mildly tender in epigastrium, obese, difficult to assess distention, but patient states she feels distended, +BS, no masses, hernias, or organomegaly.  Multiple scars noted from previous abdominal surgeries MS: all 4 extremities are symmetrical with no cyanosis, clubbing, or edema. Skin: warm and dry with no masses, lesions, or rashes Psych:  A&Ox3 with an appropriate affect.   Results for orders placed or performed during the hospital encounter of 12/01/18 (from the past 48 hour(s))  Lipase, blood     Status: None   Collection Time: 12/01/18  2:25 AM  Result Value Ref Range   Lipase 31 11 - 51 U/L    Comment: Performed at [facility], [address] [place], [place] [address]  Comprehensive metabolic panel     Status: Abnormal   Collection Time: 12/01/18  2:25 AM  Result Value Ref Range   Sodium 143 135 - 145 mmol/L   Potassium 3.1 (L) 3.5 - 5.1 mmol/L   Chloride 108 98 - 111 mmol/L   CO2 23 22 - 32 mmol/L   Glucose, Bld 148 (H) 70 - 99 mg/dL   BUN 17 8 - 23 mg/dL   Creatinine, Ser 0.81 0.44 - 1.00 mg/dL   Calcium 9.6 8.9 - 10.3 mg/dL   Total Protein 7.2 6.5 - 8.1 g/dL   Albumin 4.5 3.5 - 5.0 g/dL   AST 27 15 - 41 U/L   ALT 38 0 - 44 U/L   Alkaline Phosphatase 102 38 - 126 U/L   Total Bilirubin 0.6 0.3 - 1.2 mg/dL   GFR calc non Af Amer >60 >60 mL/min   GFR calc Af Amer >60 >60 mL/min   Anion gap 12 5 - 15    Comment: Performed at [facility], [address] [place], [place] [address]  CBC     Status: Abnormal   Collection Time: 12/01/18  2:25 AM  Result Value Ref Range   WBC 12.1 (H) 4.0 - 10.5 K/uL   RBC 4.92 3.87 - 5.11 MIL/uL   Hemoglobin 15.2 (H) 12.0 - 15.0 g/dL   HCT 46.5 (H) 36.0 - 46.0 %   MCV 94.5 80.0 - 100.0 fL   MCH 30.9 26.0 - 34.0 pg   MCHC 32.7 30.0 - 36.0 g/dL   RDW 13.8 11.5 - 15.5 %   Platelets 232 150 - 400 K/uL   nRBC 0.0 0.0 - 0.2 %    Comment: Performed at [facility], [address] [place], [place] [address]  I-Stat CG4 Lactic Acid, ED     Status: None   Collection Time: 12/01/18  2:41 AM  Result Value Ref Range   Lactic Acid, Venous 1.41 0.5 - 1.9 mmol/L  I-Stat Chem 8, ED     Status: Abnormal   Collection Time: 12/01/18  2:43 AM  Result Value Ref Range   Sodium 144 135 - 145 mmol/L   Potassium 3.1 (L) 3.5 - 5.1 mmol/L   Chloride 107 98  - 111 mmol/L  BUN 16 8 - 23 mg/dL   Creatinine, Ser 0.90 0.44 - 1.00 mg/dL   Glucose, Bld 147 (H) 70 - 99 mg/dL   Calcium, Ion 1.23 1.15 - 1.40 mmol/L   TCO2 25 22 - 32 mmol/L   Hemoglobin 16.3 (H) 12.0 - 15.0 g/dL   HCT 48.0 (H) 36.0 - 46.0 %  Urinalysis, Routine w reflex microscopic     Status: Abnormal   Collection Time: 12/01/18  6:49 AM  Result Value Ref Range   Color, Urine YELLOW YELLOW   APPearance CLEAR CLEAR   Specific Gravity, Urine 1.010 1.005 - 1.030   pH 6.0 5.0 - 8.0   Glucose, UA NEGATIVE NEGATIVE mg/dL   Hgb urine dipstick NEGATIVE NEGATIVE   Bilirubin Urine NEGATIVE NEGATIVE   Ketones, ur NEGATIVE NEGATIVE mg/dL   Protein, ur NEGATIVE NEGATIVE mg/dL   Nitrite POSITIVE (A) NEGATIVE   Leukocytes, UA TRACE (A) NEGATIVE    Comment: Performed at [facility], [address] [place], [place] [address]  Urinalysis, Microscopic (reflex)     Status: Abnormal   Collection Time: 12/01/18  6:49 AM  Result Value Ref Range   RBC / HPF 0-5 0 - 5 RBC/hpf   WBC, UA 0-5 0 - 5 WBC/hpf   Bacteria, UA MANY (A) NONE SEEN   Squamous Epithelial / LPF 0-5 0 - 5    Comment: Performed at [facility], [address] [place], [place] [address]  POC CBG, ED     Status: Abnormal   Collection Time: 12/01/18  7:42 AM  Result Value Ref Range   Glucose-Capillary 102 (H) 70 - 99 mg/dL   Ct Abdomen Pelvis W Contrast  Result Date: 12/01/2018 CLINICAL DATA:  67 year old female with abdominal pain. EXAM: CT ABDOMEN AND PELVIS WITH CONTRAST TECHNIQUE: Multidetector CT imaging of the abdomen and pelvis was performed using the standard protocol following bolus administration of intravenous contrast. CONTRAST:  167mL ISOVUE-300 IOPAMIDOL (ISOVUE-300) INJECTION 61% COMPARISON:  CT of the abdomen pelvis dated 07/27/2016 FINDINGS: Lower chest: The visualized lungs are clear. There is coronary vascular calcification. No intra-abdominal free air. Trace free fluid within the pelvis.  Hepatobiliary: Probable mild fatty infiltration of the liver. Cholecystectomy. No calcified stone noted in the central CBD. Mild central intrahepatic biliary ductal dilatation. Pancreas: Unremarkable. No pancreatic ductal dilatation or surrounding inflammatory changes. Spleen: Normal in size without focal abnormality. Adrenals/Urinary Tract: The adrenal glands are unremarkable. Mild bilateral renal parenchyma atrophy and cortical thinning and irregularity likely related to prior scarring. There is no hydronephrosis on either side. There is symmetric enhancement and excretion of contrast by both kidneys. The visualized ureters and urinary bladder appear unremarkable. Stomach/Bowel: The stomach is distended with oral content and air. Multiple dilated loops of small bowel with air-fluid levels measure up to 4.7 cm in diameter. There is a transition point in the lower abdomen (series 2, image 80 and coronal series 5, image 36) were there is tethering of multiple loops of adjacent small bowel likely secondary to adhesions. There is sigmoid diverticulosis without active inflammatory changes. Appendectomy. Vascular/Lymphatic: Moderate aortoiliac atherosclerotic disease. No portal venous gas. There is no adenopathy. Reproductive: Hysterectomy. No pelvic mass. Other: Midline vertical anterior pelvic wall incisional scar. Musculoskeletal: Degenerative changes of the spine and multilevel facet arthropathy. Total left hip arthroplasty. Grade 1 L4-L5 anterolisthesis. No acute osseous pathology. IMPRESSION: 1. Small-bowel obstruction with transition point in the lower abdomen, likely secondary to adhesions. 2. Sigmoid diverticulosis without active  inflammatory changes. Electronically Signed   By: Anner Crete M.D.   On: 12/01/2018 05:29   Dg Chest Portable 1 View  Result Date: 12/01/2018 CLINICAL DATA:  Nasogastric tube placement EXAM: PORTABLE CHEST 1 VIEW COMPARISON:  Abdominal CT earlier today FINDINGS: Nasogastric  tube with tip at least reaching the stomach. Stable low volumes. There is no edema, consolidation, effusion, or pneumothorax. Normal heart size and mediastinal contours. IMPRESSION: Nasogastric tube with tip at least reaching the stomach. Electronically Signed   By: Monte Fantasia M.D.   On: 12/01/2018 08:23   Dg Abdomen Acute W/chest  Result Date: 12/01/2018 CLINICAL DATA:  67 year old female with abdominal pain and vomiting. EXAM: DG ABDOMEN ACUTE W/ 1V CHEST COMPARISON:  CT of the abdomen pelvis dated 12/01/2018 FINDINGS: The lungs are clear. There is no pleural effusion or pneumothorax. The cardiac silhouette is within normal limits. Dilated loops of small bowel with air-fluid levels most consistent with small-bowel obstruction. No free air. The osseous structures and soft tissues are grossly unremarkable. IMPRESSION: 1. Small-bowel obstruction. 2. No acute cardiopulmonary process. Electronically Signed   By: Anner Crete M.D.   On: 12/01/2018 05:31      Assessment/Plan SBO -patient appears to have a similar situation as she did 2 years ago.  She has already had a liquid BM today which is a good sign.  She already has an NGT in place.  We will start the SBO protocol and see how she does.  Hopefully this will continue to resolved quickly.  If she fails to improve or worsens, then she may need an operation.  This was all discussed with the patient and she understands the plan and is in agreement.   FEN - NPO/NGT/IVFs VTE - ok for chemical prophylaxis from our standpoint ID - none needed, suspect WBC of 12 is secondary to dehydration as there is no evidence of intra-abdominal infection  Jordan [name], [facility] Surgery 12/01/2018, 12:51 PM Pager: [phone]

## 2018-12-01 NOTE — ED Notes (Signed)
[facility] notified Jordan [name]) - patient ready for transport to ED

## 2018-12-01 NOTE — ED Notes (Signed)
ED TO INPATIENT HANDOFF REPORT  Name/Age/Gender Jordan [name] 67 y.o. female  Code Status    Code Status Orders  (From admission, onward)         Start     Ordered   12/01/18 1520  Full code  Continuous     12/01/18 1519        Code Status History    Date Active Date Inactive Code Status Order ID Comments User Context   07/28/2016 0317 07/31/2016 1548 Full Code [id number]  [name], MD ED   07/28/2016 0312 07/28/2016 0317 Full Code [id number]  [name], MD ED      Home/SNF/Other Home  Chief Complaint abdominal pain..n/v  Level of Care/Admitting Diagnosis ED Disposition    ED Disposition Condition Comment   Admit  Hospital Area: [facility] [[id number]]  Level of Care: Med-Surg [16]  Diagnosis: SBO (small bowel obstruction) ([place]) [578469]  Admitting Physician: Barton Dubois [[id number]]  Attending Physician: Barton Dubois [[id number]]  Estimated length of stay: past midnight tomorrow  Certification:: I certify this patient will need inpatient services for at least 2 midnights  PT Class (Do Not Modify): Inpatient [101]  PT Acc Code (Do Not Modify): Private [1]       Medical History Past Medical History:  Diagnosis Date  . Cellulitis and abscess of finger, unspecified 401.9  . Cellulitis and abscess of foot, except toes   . Depressive disorder, not elsewhere classified   . GERD (gastroesophageal reflux disease)   . Gout, unspecified   . Hyperglycemia 05/08/2011  . Hyperlipidemia    borderline- not on meds  . Insomnia   . Leg edema    chronic  . Obesity, unspecified   . Osteoarthritis   . Renal insufficiency, mild 05/08/2011  . Small bowel obstruction ([facility])     Allergies No Known Allergies  IV Location/Drains/Wounds Patient Lines/Drains/Airways Status   Active Line/Drains/Airways    Name:   Placement date:   Placement time:   Site:   Days:   Peripheral IV 12/01/18 Left Antecubital   12/01/18    0230    Antecubital   less than 1   NG/OG Tube  Nasogastric 18 Fr. Right nare Aucultation;Xray Measured external length of tube   12/01/18    0745    Right nare   less than 1          Labs/Imaging Results for orders placed or performed during the hospital encounter of 12/01/18 (from the past 48 hour(s))  Lipase, blood     Status: None   Collection Time: 12/01/18  2:25 AM  Result Value Ref Range   Lipase 31 11 - 51 U/L    Comment: Performed at [facility], [address] [place], [place] [address]  Comprehensive metabolic panel     Status: Abnormal   Collection Time: 12/01/18  2:25 AM  Result Value Ref Range   Sodium 143 135 - 145 mmol/L   Potassium 3.1 (L) 3.5 - 5.1 mmol/L   Chloride 108 98 - 111 mmol/L   CO2 23 22 - 32 mmol/L   Glucose, Bld 148 (H) 70 - 99 mg/dL   BUN 17 8 - 23 mg/dL   Creatinine, Ser 0.81 0.44 - 1.00 mg/dL   Calcium 9.6 8.9 - 10.3 mg/dL   Total Protein 7.2 6.5 - 8.1 g/dL   Albumin 4.5 3.5 - 5.0 g/dL   AST 27 15 - 41 U/L   ALT 38 0 - 44  U/L   Alkaline Phosphatase 102 38 - 126 U/L   Total Bilirubin 0.6 0.3 - 1.2 mg/dL   GFR calc non Af Amer >60 >60 mL/min   GFR calc Af Amer >60 >60 mL/min   Anion gap 12 5 - 15    Comment: Performed at [facility], [address] [place], [place] [address]  CBC     Status: Abnormal   Collection Time: 12/01/18  2:25 AM  Result Value Ref Range   WBC 12.1 (H) 4.0 - 10.5 K/uL   RBC 4.92 3.87 - 5.11 MIL/uL   Hemoglobin 15.2 (H) 12.0 - 15.0 g/dL   HCT 46.5 (H) 36.0 - 46.0 %   MCV 94.5 80.0 - 100.0 fL   MCH 30.9 26.0 - 34.0 pg   MCHC 32.7 30.0 - 36.0 g/dL   RDW 13.8 11.5 - 15.5 %   Platelets 232 150 - 400 K/uL   nRBC 0.0 0.0 - 0.2 %    Comment: Performed at [facility], [address] [place], [place] [address]  I-Stat CG4 Lactic Acid, ED     Status: None   Collection Time: 12/01/18  2:41 AM  Result Value Ref Range   Lactic Acid, Venous 1.41 0.5 - 1.9 mmol/L  I-Stat Chem 8, ED     Status: Abnormal   Collection Time: 12/01/18  2:43  AM  Result Value Ref Range   Sodium 144 135 - 145 mmol/L   Potassium 3.1 (L) 3.5 - 5.1 mmol/L   Chloride 107 98 - 111 mmol/L   BUN 16 8 - 23 mg/dL   Creatinine, Ser 0.90 0.44 - 1.00 mg/dL   Glucose, Bld 147 (H) 70 - 99 mg/dL   Calcium, Ion 1.23 1.15 - 1.40 mmol/L   TCO2 25 22 - 32 mmol/L   Hemoglobin 16.3 (H) 12.0 - 15.0 g/dL   HCT 48.0 (H) 36.0 - 46.0 %  Urinalysis, Routine w reflex microscopic     Status: Abnormal   Collection Time: 12/01/18  6:49 AM  Result Value Ref Range   Color, Urine YELLOW YELLOW   APPearance CLEAR CLEAR   Specific Gravity, Urine 1.010 1.005 - 1.030   pH 6.0 5.0 - 8.0   Glucose, UA NEGATIVE NEGATIVE mg/dL   Hgb urine dipstick NEGATIVE NEGATIVE   Bilirubin Urine NEGATIVE NEGATIVE   Ketones, ur NEGATIVE NEGATIVE mg/dL   Protein, ur NEGATIVE NEGATIVE mg/dL   Nitrite POSITIVE (A) NEGATIVE   Leukocytes, UA TRACE (A) NEGATIVE    Comment: Performed at [facility], [address] [place], [place] [address]  Urinalysis, Microscopic (reflex)     Status: Abnormal   Collection Time: 12/01/18  6:49 AM  Result Value Ref Range   RBC / HPF 0-5 0 - 5 RBC/hpf   WBC, UA 0-5 0 - 5 WBC/hpf   Bacteria, UA MANY (A) NONE SEEN   Squamous Epithelial / LPF 0-5 0 - 5    Comment: Performed at [facility], [address] [place], [place] [address]  POC CBG, ED     Status: Abnormal   Collection Time: 12/01/18  7:42 AM  Result Value Ref Range   Glucose-Capillary 102 (H) 70 - 99 mg/dL  Magnesium     Status: None   Collection Time: 12/01/18  3:40 PM  Result Value Ref Range   Magnesium 1.7 1.7 - 2.4 mg/dL    Comment: Performed at [facility], [address] [place], [place] [address]  Phosphorus     Status: None   Collection Time: 12/01/18  3:40 PM  Result Value Ref Range   Phosphorus 3.5 2.5 - 4.6 mg/dL    Comment: Performed at [facility], [address] [place], [place] [address]   Ct Abdomen Pelvis W  Contrast  Result Date: 12/01/2018 CLINICAL DATA:  67 year old female with abdominal pain. EXAM: CT ABDOMEN AND PELVIS WITH CONTRAST TECHNIQUE: Multidetector CT imaging of the abdomen and pelvis was performed using the standard protocol following bolus administration of intravenous contrast. CONTRAST:  146mL ISOVUE-300 IOPAMIDOL (ISOVUE-300) INJECTION 61% COMPARISON:  CT of the abdomen pelvis dated 07/27/2016 FINDINGS: Lower chest: The visualized lungs are clear. There is coronary vascular calcification. No intra-abdominal free air. Trace free fluid within the pelvis. Hepatobiliary: Probable mild fatty infiltration of the liver. Cholecystectomy. No calcified stone noted in the central CBD. Mild central intrahepatic biliary ductal dilatation. Pancreas: Unremarkable. No pancreatic ductal dilatation or surrounding inflammatory changes. Spleen: Normal in size without focal abnormality. Adrenals/Urinary Tract: The adrenal glands are unremarkable. Mild bilateral renal parenchyma atrophy and cortical thinning and irregularity likely related to prior scarring. There is no hydronephrosis on either side. There is symmetric enhancement and excretion of contrast by both kidneys. The visualized ureters and urinary bladder appear unremarkable. Stomach/Bowel: The stomach is distended with oral content and air. Multiple dilated loops of small bowel with air-fluid levels measure up to 4.7 cm in diameter. There is a transition point in the lower abdomen (series 2, image 80 and coronal series 5, image 36) were there is tethering of multiple loops of adjacent small bowel likely secondary to adhesions. There is sigmoid diverticulosis without active inflammatory changes. Appendectomy. Vascular/Lymphatic: Moderate aortoiliac atherosclerotic disease. No portal venous gas. There is no adenopathy. Reproductive: Hysterectomy. No pelvic mass. Other: Midline vertical anterior pelvic wall incisional scar. Musculoskeletal: Degenerative changes  of the spine and multilevel facet arthropathy. Total left hip arthroplasty. Grade 1 L4-L5 anterolisthesis. No acute osseous pathology. IMPRESSION: 1. Small-bowel obstruction with transition point in the lower abdomen, likely secondary to adhesions. 2. Sigmoid diverticulosis without active inflammatory changes. Electronically Signed   By: Anner Crete M.D.   On: 12/01/2018 05:29   Dg Chest Portable 1 View  Result Date: 12/01/2018 CLINICAL DATA:  Nasogastric tube placement EXAM: PORTABLE CHEST 1 VIEW COMPARISON:  Abdominal CT earlier today FINDINGS: Nasogastric tube with tip at least reaching the stomach. Stable low volumes. There is no edema, consolidation, effusion, or pneumothorax. Normal heart size and mediastinal contours. IMPRESSION: Nasogastric tube with tip at least reaching the stomach. Electronically Signed   By: Monte Fantasia M.D.   On: 12/01/2018 08:23   Dg Abdomen Acute W/chest  Result Date: 12/01/2018 CLINICAL DATA:  67 year old female with abdominal pain and vomiting. EXAM: DG ABDOMEN ACUTE W/ 1V CHEST COMPARISON:  CT of the abdomen pelvis dated 12/01/2018 FINDINGS: The lungs are clear. There is no pleural effusion or pneumothorax. The cardiac silhouette is within normal limits. Dilated loops of small bowel with air-fluid levels most consistent with small-bowel obstruction. No free air. The osseous structures and soft tissues are grossly unremarkable. IMPRESSION: 1. Small-bowel obstruction. 2. No acute cardiopulmonary process. Electronically Signed   By: Anner Crete M.D.   On: 12/01/2018 05:31    Pending Labs Unresulted Labs (From admission, onward)    Start     Ordered   12/02/18 2595  Basic metabolic panel  Tomorrow morning,   R  12/01/18 1519   12/02/18 0500  CBC  Tomorrow morning,   R     12/01/18 1519   12/01/18 1520  HIV antibody (Routine Testing)  Once,   R     12/01/18 1519          Vitals/Pain Today's Vitals   12/01/18 1300 12/01/18 1500 12/01/18 1600  12/01/18 1700  BP: 121/64 132/61 (!) 145/73 125/61  Pulse: 70 72 72 72  Resp: 16 16 16 16   Temp:      TempSrc:      SpO2: 94% 96% 95% 94%  Weight:      Height:      PainSc:        Isolation Precautions No active isolations  Medications Medications  lidocaine (XYLOCAINE) 4 % external solution (has no administration in time range)  metoprolol tartrate (LOPRESSOR) injection 5 mg (5 mg Intravenous Given 12/01/18 1721)  pantoprazole (PROTONIX) injection 40 mg (40 mg Intravenous Given 12/01/18 1616)  heparin injection 5,000 Units (has no administration in time range)  acetaminophen (TYLENOL) tablet 650 mg (has no administration in time range)    Or  acetaminophen (TYLENOL) suppository 650 mg (has no administration in time range)  morphine 2 MG/ML injection 2 mg (has no administration in time range)  ondansetron (ZOFRAN) tablet 4 mg (has no administration in time range)    Or  ondansetron (ZOFRAN) injection 4 mg (has no administration in time range)  0.9 % NaCl with KCl 40 mEq / L  infusion (100 mL/hr Intravenous New Bag/Given 12/01/18 1730)  ondansetron (ZOFRAN) injection 4 mg (4 mg Intravenous Given 12/01/18 0247)  0.9 %  sodium chloride infusion ( Intravenous Stopped 12/01/18 0348)  iopamidol (ISOVUE-300) 61 % injection 100 mL (100 mLs Intravenous Contrast Given 12/01/18 0444)  potassium chloride 10 mEq in 100 mL IVPB (10 mEq Intravenous Transfusing/Transfer 12/01/18 0810)  sodium chloride 0.9 % bolus 1,000 mL (0 mLs Intravenous Stopped 12/01/18 0952)  ondansetron (ZOFRAN) injection 4 mg (4 mg Intravenous Given 12/01/18 0751)  morphine 4 MG/ML injection 4 mg (4 mg Intravenous Given 12/01/18 0805)  potassium chloride 10 mEq in 100 mL IVPB (0 mEq Intravenous Stopped 12/01/18 1727)  diatrizoate meglumine-sodium (GASTROGRAFIN) 66-10 % solution 90 mL (90 mLs Per NG tube Given 12/01/18 1358)    Mobility walks

## 2018-12-01 NOTE — ED Notes (Signed)
C/o abd pain onset Sunday w nausea   No vomiting at home  Did vomit here

## 2018-12-01 NOTE — H&P (Signed)
History and Physical    Jordan [name] HQI:[id number] DOB: 02/06/1951 DOA: 12/01/2018  Referring MD/NP/PA: Dr. Gilford Raid PCP: [name], NP  Patient coming from: Home  Chief Complaint: Abdominal pain, nausea and vomiting.  HPI: Jordan [name] is a 67 y.o. female 68 year old female with a past medical history significant for hypertension, hyperlipidemia, hyperglycemia, gastroesophageal reflux disease, tobacco abuse and morbid obesity; who presented to the emergency department secondary to abdominal pain, nausea and vomiting.  Patient reports symptoms started on 11/29/2018 and despite slight improvement at nighttime and Sunday they started to worsen again since Monday midday.  Pain is localized in the middle section and lower part of her abdomen, not radiated, constant (even she expressed decrease in intensity at times), sharp in nature and 8 out of 10 in intensity.  The pain has been associated with nausea and vomiting (no blood appreciated in the vomit content) and also with decreased appetite.    Patient denies chest pain, shortness of breath, focal weakness, headaches, blurred vision, hematuria, dysuria, fever, chills, palpitations, hematemesis, melena or hematochezia.  In ED evaluation has demonstrated mild hypokalemia and CT abdomen/abdominal x-ray 8 demonstrating SBO with transition point.  General surgery was consulted, IV fluids initiated an NG tube placed.  Patient also receive IV potassium, pain medication and antiemetics.  [facility] has been consulted to admit patient for further evaluation and management.  Of note, patient had a prior history about 2 years ago by his p.o. and expressed her symptoms corresponds to what she felt at that time. Past Medical/Surgical History: Past Medical History:  Diagnosis Date  . Cellulitis and abscess of finger, unspecified 401.9  . Cellulitis and abscess of foot, except toes   . Depressive disorder, not elsewhere classified   . GERD  (gastroesophageal reflux disease)   . Gout, unspecified   . Hyperglycemia 05/08/2011  . Hyperlipidemia    borderline- not on meds  . Insomnia   . Leg edema    chronic  . Obesity, unspecified   . Osteoarthritis   . Renal insufficiency, mild 05/08/2011  . Small bowel obstruction [facility])     Past Surgical History:  Procedure Laterality Date  . ABDOMINAL HYSTERECTOMY  2002   infection- no history of cancer  . APPENDECTOMY  2002  . CHOLECYSTECTOMY  1977  . ECTOPIC PREGNANCY SURGERY  1971  . right shoulder rotator cuff repair  06-07-10  . TOTAL HIP ARTHROPLASTY Left 1998    Social History:  reports that she has been smoking cigarettes. She has been smoking about 0.25 packs per day. She has never used smokeless tobacco. She reports that she drinks alcohol. She reports that she does not use drugs.  Allergies: No Known Allergies  Family History:  Family History  Problem Relation Age of Onset  . Diabetes Mother   . Cancer Mother        colon/ pancreatic  . Colon cancer Mother 30  . Hypertension Father   . Alcohol abuse Father   . Cirrhosis Father   . Other Sister        Pyeoderma gangrenosis  . Obesity Daughter   . Diabetes Sister        type 2  . Kidney failure Sister   . Liver disease Sister   . Heart attack Brother   . Cirrhosis Sister        had NASH, died heart failure    Prior to Admission medications   Medication Sig Start Date End Date Taking? Authorizing Provider  allopurinol (  ZYLOPRIM) 300 MG tablet TAKE 1/2 TABLET BY MOUTH 2 TIMES A DAY Patient taking differently: Take 300 mg by mouth daily. TAKE 1/2 TABLET BY MOUTH 2 TIMES A DAY 06/05/18  Yes [name], NP  aspirin EC 81 MG tablet Take 81 mg by mouth daily.   Yes [provider]  Cholecalciferol 25 MCG (1000 UT) capsule Take 1,000 Units by mouth daily.   Yes [provider]  DULoxetine (CYMBALTA) 60 MG capsule Take 1 capsule po daily Patient taking differently: Take 60 mg by mouth  daily.  06/05/18  Yes [name], NP  furosemide (LASIX) 40 MG tablet Take 1 tablet (40 mg total) by mouth daily as needed. Patient taking differently: Take 40 mg by mouth every other day. Alternate days with Dyazide 09/02/17  Yes [name], NP  Multiple Vitamin (MULTIVITAMIN WITH MINERALS) TABS tablet Take 1 tablet by mouth daily.   Yes [provider]  pantoprazole (PROTONIX) 40 MG tablet Take 1 tablet (40 mg total) by mouth daily. 06/05/18  Yes [name], NP  phentermine (ADIPEX-P) 37.5 MG tablet Take 18.75 mg by mouth 3 (three) times a week.  11/09/18  Yes [provider]  triamterene-hydrochlorothiazide (DYAZIDE) 37.5-25 MG capsule Take 1 each (1 capsule total) by mouth every morning. Patient taking differently: Take 1 capsule by mouth every other day. Alternate days with Furosemide 06/05/18  Yes [name], NP  famotidine (PEPCID AC) 10 MG chewable tablet Chew 10 mg by mouth daily.    03/03/12  [provider]    Review of Systems:  Negative except as otherwise mentioned in HPI.   Physical Exam: Vitals:   12/01/18 0645 12/01/18 0716 12/01/18 0916 12/01/18 1135  BP: 121/74 126/70 132/75 (!) 119/103  Pulse: 76 72 73 73  Resp: 18 16 15 16   Temp:   97.6 F (36.4 C)   TempSrc:      SpO2: 96% 96% 93% 95%  Weight:      Height:       Constitutional: In mild distress secondary to abdominal discomfort; NG tube in place, denies chest pain, no further vomiting.  Patient is afebrile. Eyes: PERRL, lids and conjunctivae normal, no icterus, no nystagmus. ENMT: Mucous membranes slightly dry on exam. Posterior pharynx clear of any exudate or lesions.Normal dentition.  Neck: normal, supple, no masses, no thyromegaly, no JVD Respiratory: clear to auscultation bilaterally, no wheezing, no crackles. Normal respiratory effort. No accessory muscle use.  Cardiovascular: Regular rate and rhythm, no murmurs / rubs / gallops. No extremity edema.  2+ pedal pulses. No carotid bruits.  Abdomen: Currently without guarding, absent bowel sounds on auscultation; moderate tenderness to palpation in her mid and lower abdomen.  No masses appreciated.    Musculoskeletal: no clubbing / cyanosis. No joint deformity upper and lower extremities. Good ROM, no contractures. Normal muscle tone.  Skin: No open wounds, left upper arm with a small scabs lesions the patient is not aware how they happened; there is no tenderness, drainage or any signs of superimposed infection.  No other lesions, induration, open wounds or petechiae appreciated.   Neurologic: CN 2-12 grossly intact. Sensation intact, DTR normal. Strength 5/5 in all 4.  Psychiatric: Normal judgment and insight. Alert and oriented x 3. Normal mood.    Labs on Admission: I have personally reviewed the following labs and imaging studies  CBC: Recent Labs  Lab 12/01/18 0225 12/01/18 0243  WBC 12.1*  --   HGB 15.2* 16.3*  HCT 46.5* 48.0*  MCV 94.5  --   PLT 232  --    Basic Metabolic Panel: Recent Labs  Lab 12/01/18 0225 12/01/18 0243  NA 143 144  K 3.1* 3.1*  CL 108 107  CO2 23  --   GLUCOSE 148* 147*  BUN 17 16  CREATININE 0.81 0.90  CALCIUM 9.6  --    GFR: Estimated Creatinine Clearance: 72.8 mL/min (by C-G formula based on SCr of 0.9 mg/dL).   Liver Function Tests: Recent Labs  Lab 12/01/18 0225  AST 27  ALT 38  ALKPHOS 102  BILITOT 0.6  PROT 7.2  ALBUMIN 4.5   Recent Labs  Lab 12/01/18 0225  LIPASE 31   CBG: Recent Labs  Lab 12/01/18 0742  GLUCAP 102*   Urine analysis:    Component Value Date/Time   COLORURINE YELLOW 12/01/2018 0649   APPEARANCEUR CLEAR 12/01/2018 0649   LABSPEC 1.010 12/01/2018 0649   PHURINE 6.0 12/01/2018 0649   GLUCOSEU NEGATIVE 12/01/2018 0649   GLUCOSEU NEGATIVE 08/11/2015 1023   HGBUR NEGATIVE 12/01/2018 0649   BILIRUBINUR NEGATIVE 12/01/2018 0649   [place] 12/01/2018 0649   PROTEINUR NEGATIVE 12/01/2018 0649     UROBILINOGEN 0.2 08/11/2015 1023   NITRITE POSITIVE (A) 12/01/2018 0649   LEUKOCYTESUR TRACE (A) 12/01/2018 0649   Radiological Exams on Admission: Ct Abdomen Pelvis W Contrast  Result Date: 12/01/2018 CLINICAL DATA:  67 year old female with abdominal pain. EXAM: CT ABDOMEN AND PELVIS WITH CONTRAST TECHNIQUE: Multidetector CT imaging of the abdomen and pelvis was performed using the standard protocol following bolus administration of intravenous contrast. CONTRAST:  174mL ISOVUE-300 IOPAMIDOL (ISOVUE-300) INJECTION 61% COMPARISON:  CT of the abdomen pelvis dated 07/27/2016 FINDINGS: Lower chest: The visualized lungs are clear. There is coronary vascular calcification. No intra-abdominal free air. Trace free fluid within the pelvis. Hepatobiliary: Probable mild fatty infiltration of the liver. Cholecystectomy. No calcified stone noted in the central CBD. Mild central intrahepatic biliary ductal dilatation. Pancreas: Unremarkable. No pancreatic ductal dilatation or surrounding inflammatory changes. Spleen: Normal in size without focal abnormality. Adrenals/Urinary Tract: The adrenal glands are unremarkable. Mild bilateral renal parenchyma atrophy and cortical thinning and irregularity likely related to prior scarring. There is no hydronephrosis on either side. There is symmetric enhancement and excretion of contrast by both kidneys. The visualized ureters and urinary bladder appear unremarkable. Stomach/Bowel: The stomach is distended with oral content and air. Multiple dilated loops of small bowel with air-fluid levels measure up to 4.7 cm in diameter. There is a transition point in the lower abdomen (series 2, image 80 and coronal series 5, image 36) were there is tethering of multiple loops of adjacent small bowel likely secondary to adhesions. There is sigmoid diverticulosis without active inflammatory changes. Appendectomy. Vascular/Lymphatic: Moderate aortoiliac atherosclerotic disease. No portal  venous gas. There is no adenopathy. Reproductive: Hysterectomy. No pelvic mass. Other: Midline vertical anterior pelvic wall incisional scar. Musculoskeletal: Degenerative changes of the spine and multilevel facet arthropathy. Total left hip arthroplasty. Grade 1 L4-L5 anterolisthesis. No acute osseous pathology. IMPRESSION: 1. Small-bowel obstruction with transition point in the lower abdomen, likely secondary to adhesions. 2. Sigmoid diverticulosis without active inflammatory changes. Electronically Signed   By: Anner Crete M.D.   On: 12/01/2018 05:29   Dg Chest Portable 1 View  Result Date: 12/01/2018 CLINICAL DATA:  Nasogastric tube placement EXAM: PORTABLE CHEST 1 VIEW COMPARISON:  Abdominal CT earlier today FINDINGS: Nasogastric tube with tip at least reaching the stomach. Stable low volumes. There is no edema, consolidation,  effusion, or pneumothorax. Normal heart size and mediastinal contours. IMPRESSION: Nasogastric tube with tip at least reaching the stomach. Electronically Signed   By: Monte Fantasia M.D.   On: 12/01/2018 08:23   Dg Abdomen Acute W/chest  Result Date: 12/01/2018 CLINICAL DATA:  67 year old female with abdominal pain and vomiting. EXAM: DG ABDOMEN ACUTE W/ 1V CHEST COMPARISON:  CT of the abdomen pelvis dated 12/01/2018 FINDINGS: The lungs are clear. There is no pleural effusion or pneumothorax. The cardiac silhouette is within normal limits. Dilated loops of small bowel with air-fluid levels most consistent with small-bowel obstruction. No free air. The osseous structures and soft tissues are grossly unremarkable. IMPRESSION: 1. Small-bowel obstruction. 2. No acute cardiopulmonary process. Electronically Signed   By: Anner Crete M.D.   On: 12/01/2018 05:31    EKG: None  Assessment/Plan 1-abdominal pain, nausea and vomiting: In the setting of acute SBO (small bowel obstruction) ([place]). -Appears to be associated with adhesions as transition point was seen on CT  abdomen (patient has history of multiple abdominal surgeries in the past). -She experienced an episode of SBO about 2 years ago and expressed that her current symptoms are very similar to prior presentation. -General surgery has been consulted and will follow further recommendation -Conservative management has been initiated with NG tube placement (intermittent suction), IV fluid resuscitation and electrolyte repletion -Continue as needed analgesics and antiemetics. -Patient is n.p.o. except for ice chips.  2-hyperlipidemia -No p.o. medications will be given at this moment -Follow lipid panel as an outpatient -Prior to admission was no using any statins.  3-Obesity, Class III, BMI 40-49.9 (morbid obesity) ([facility]) -Body mass index is 41.88 kg/m. -Low calorie diet, portion control and increase physical activity has been discussed with patient.  4-Depression -Stable mood currently -No suicidal ideation or hallucination -Plan is to resume Cymbalta once able to take by mouth.  5-Essential hypertension -Will hold oral antihypertensive agents while n.p.o. -Start IV metoprolol -Follow vital signs and adjust management as needed. -Pain most likely playing a role in blood pressure elevation.  6-Hyperglycemia -Most recent A1c September 2019 (6.2) -Has been advised to follow modified carbohydrate diet and to lose weight. -Outpatient follow-up with PCP to further monitor her blood sugar levels and initiate hypoglycemic regimen when appropriate.  7-GERD (gastroesophageal reflux disease) -Started on Protonix IV -Resume oral PPI when taken by mouth.  8-Hypokalemia -Due to poor oral intake and ongoing vomiting prior to admission. -Home use of diuretics most likely contributing as well with her low potassium -Will replete potassium IV and will check magnesium and phosphorus level.  9-Tobacco abuse -I have discussed tobacco cessation with the patient.  I have counseled the patient regarding the  negative impacts of continued tobacco use including but not limited to lung cancer, COPD, and cardiovascular disease.  I have discussed alternatives to tobacco and modalities that may help facilitate tobacco cessation including but not limited to biofeedback, hypnosis, and medications.  Total time spent with tobacco counseling was 4 minutes. -Patient has declined nicotine patch    DVT prophylaxis: Heparin Code Status: Full code Family Communication: Daughter at bedside Disposition Plan: Home once SBO resolved. Consults called: General surgery Admission status: Inpatient, length of stay more than 2 midnights; MedSurg bed.  Is my clinical impression that due to acute small bowel obstruction with transition point seen on CT scan along with abnormal abnormalities on her electrolytes patient will require more than 2 midnights of hospitalization to stabilize her condition and make her appropriate/safe for discharge.  Time Spent: 70 minutes  Barton Dubois MD [facility] Pager [phone]  If 7PM-7AM, please contact night-coverage [email or web address] Password [email or web address]  12/01/2018, 11:43 AM

## 2018-12-01 NOTE — Progress Notes (Signed)
Attempted to call report at [phone] placed on hold.

## 2018-12-01 NOTE — ED Notes (Signed)
Amy (Daughter): [phone]; daughter would like to be updated when patient gets a room

## 2018-12-01 NOTE — ED Provider Notes (Addendum)
[facility] EMERGENCY DEPARTMENT Provider Note   CSN: [id number] Arrival date & time: 12/01/18  0209     History   Chief Complaint Chief Complaint  Patient presents with  . Abdominal Pain  . Nausea    HPI Jordan [name] is a 67 y.o. female.  Patient concerned that she is having another bowel obstruction.  She has had upper abdominal pain that has been there the past 2 days that has been constant.  Has had no appetite.  Did have 1 episode of vomiting on arrival.  Has still been passing gas from below.  No fever.  No pain with urination or blood in the urine.  States she is having "upside down farts" which reminds her of her previous bowel obstruction 2 years ago.  She had multiple abdominal surgeries include appendectomy, hysterectomy, cholecystectomy.  No chest pain or shortness of breath.  The history is provided by the patient.  Abdominal Pain   Associated symptoms include nausea and vomiting. Pertinent negatives include fever, dysuria, hematuria, headaches, arthralgias and myalgias.    Past Medical History:  Diagnosis Date  . Cellulitis and abscess of finger, unspecified 401.9  . Cellulitis and abscess of foot, except toes   . Depressive disorder, not elsewhere classified   . GERD (gastroesophageal reflux disease)   . Gout, unspecified   . Hyperglycemia 05/08/2011  . Hyperlipidemia    borderline- not on meds  . Insomnia   . Leg edema    chronic  . Obesity, unspecified   . Osteoarthritis   . Renal insufficiency, mild 05/08/2011  . Small bowel obstruction [facility])     Patient Active Problem List   Diagnosis Date Noted  . Benign paroxysmal positional vertigo 07/25/2015  . Routine general medical examination at a health care facility 07/06/2012  . GERD (gastroesophageal reflux disease) 01/03/2012  . Hyperglycemia 05/08/2011  . General medical examination 05/06/2011  . Gout 02/21/2010  . Degenerative disc disease, lumbar 07/11/2009  . OSTEOARTHRITIS  02/19/2009  . Hyperlipidemia 01/16/2009  . Obesity 02/17/2008  . Depression 09/04/2007  . Essential hypertension 09/04/2007    Past Surgical History:  Procedure Laterality Date  . ABDOMINAL HYSTERECTOMY  2002   infection- no history of cancer  . APPENDECTOMY  2002  . CHOLECYSTECTOMY  1977  . ECTOPIC PREGNANCY SURGERY  1971  . right shoulder rotator cuff repair  06-07-10  . TOTAL HIP ARTHROPLASTY Left 1998     OB History   None      Home Medications    Prior to Admission medications   Medication Sig Start Date End Date Taking? Authorizing Provider  allopurinol (ZYLOPRIM) 300 MG tablet TAKE 1/2 TABLET BY MOUTH 2 TIMES A DAY 06/05/18   [name], NP  aspirin EC 81 MG tablet Take 81 mg by mouth daily.    [provider]  DULoxetine (CYMBALTA) 60 MG capsule Take 1 capsule po daily 06/05/18   [name], NP  furosemide (LASIX) 40 MG tablet Take 1 tablet (40 mg total) by mouth daily as needed. 09/02/17   [name], NP  Multiple Vitamin (MULTIVITAMIN WITH MINERALS) TABS tablet Take 1 tablet by mouth daily.    [provider]  pantoprazole (PROTONIX) 40 MG tablet Take 1 tablet (40 mg total) by mouth daily. 06/05/18   [name], NP  triamterene-hydrochlorothiazide (DYAZIDE) 37.5-25 MG capsule Take 1 each (1 capsule total) by mouth every morning. Patient taking differently: Take 1 capsule by mouth every morning.  06/05/18   Inda Castle,  Melissa, NP  famotidine (PEPCID AC) 10 MG chewable tablet Chew 10 mg by mouth daily.    03/03/12  [provider]    Family History Family History  Problem Relation Age of Onset  . Diabetes Mother   . Cancer Mother        colon/ pancreatic  . Colon cancer Mother 37  . Hypertension Father   . Alcohol abuse Father   . Cirrhosis Father   . Other Sister        Pyeoderma gangrenosis  . Obesity Daughter   . Diabetes Sister        type 2  . Kidney failure Sister   . Liver disease Sister     . Heart attack Brother   . Cirrhosis Sister        had NASH, died heart failure    Social History Social History   Tobacco Use  . Smoking status: Current Some Day Smoker    Packs/day: 0.25    Types: Cigarettes    Last attempt to quit: 12/26/2014    Years since quitting: 3.9  . Smokeless tobacco: Never Used  Substance Use Topics  . Alcohol use: Yes    Alcohol/week: 0.0 standard drinks    Comment: rare  . Drug use: No     Allergies   Patient has no known allergies.   Review of Systems Review of Systems  Constitutional: Positive for activity change and appetite change. Negative for fever.  HENT: Negative for congestion and rhinorrhea.   Respiratory: Negative for cough, chest tightness and shortness of breath.   Gastrointestinal: Positive for abdominal pain, nausea and vomiting.  Genitourinary: Negative for dysuria and hematuria.  Musculoskeletal: Negative for arthralgias and myalgias.  Skin: Negative for rash.  Neurological: Negative for dizziness, weakness and headaches.   all other systems are negative except as noted in the HPI and PMH.     Physical Exam Updated Vital Signs BP (!) 149/102 (BP Location: Right Arm)   Pulse 88   Temp 98.5 F (36.9 C) (Oral)   Resp 20   Ht 5\' 4"  (1.626 m)   Wt 110.7 kg   SpO2 98%   BMI 41.88 kg/m   Physical Exam  Constitutional: She is oriented to person, place, and time. She appears well-developed and well-nourished. No distress.  Obese, moist mucous membranes  HENT:  Head: Normocephalic and atraumatic.  Mouth/Throat: Oropharynx is clear and moist. No oropharyngeal exudate.  Eyes: Pupils are equal, round, and reactive to light. Conjunctivae and EOM are normal.  Neck: Normal range of motion. Neck supple.  No meningismus.  Cardiovascular: Normal rate, regular rhythm, normal heart sounds and intact distal pulses.  No murmur heard. Pulmonary/Chest: Effort normal and breath sounds normal. No respiratory distress.  Abdominal:  Soft. There is tenderness. There is guarding. There is no rebound.  Epigastric tenderness with voluntary guarding  Musculoskeletal: Normal range of motion. She exhibits no edema or tenderness.  Neurological: She is alert and oriented to person, place, and time. No cranial nerve deficit. She exhibits normal muscle tone. Coordination normal.  No ataxia on finger to nose bilaterally. No pronator drift. 5/5 strength throughout. CN 2-12 intact.Equal grip strength. Sensation intact.   Skin: Skin is warm.  Psychiatric: She has a normal mood and affect. Her behavior is normal.  Nursing note and vitals reviewed.    ED Treatments / Results  Labs (all labs ordered are listed, but only abnormal results are displayed) Labs Reviewed  COMPREHENSIVE METABOLIC PANEL -  Abnormal; Notable for the following components:      Result Value   Potassium 3.1 (*)    Glucose, Bld 148 (*)    All other components within normal limits  CBC - Abnormal; Notable for the following components:   WBC 12.1 (*)    Hemoglobin 15.2 (*)    HCT 46.5 (*)    All other components within normal limits  I-STAT CHEM 8, ED - Abnormal; Notable for the following components:   Potassium 3.1 (*)    Glucose, Bld 147 (*)    Hemoglobin 16.3 (*)    HCT 48.0 (*)    All other components within normal limits  LIPASE, BLOOD  URINALYSIS, ROUTINE W REFLEX MICROSCOPIC  I-STAT CG4 LACTIC ACID, ED  I-STAT CG4 LACTIC ACID, ED    EKG None  Radiology Ct Abdomen Pelvis W Contrast  Result Date: 12/01/2018 CLINICAL DATA:  67 year old female with abdominal pain. EXAM: CT ABDOMEN AND PELVIS WITH CONTRAST TECHNIQUE: Multidetector CT imaging of the abdomen and pelvis was performed using the standard protocol following bolus administration of intravenous contrast. CONTRAST:  132mL ISOVUE-300 IOPAMIDOL (ISOVUE-300) INJECTION 61% COMPARISON:  CT of the abdomen pelvis dated 07/27/2016 FINDINGS: Lower chest: The visualized lungs are clear. There is  coronary vascular calcification. No intra-abdominal free air. Trace free fluid within the pelvis. Hepatobiliary: Probable mild fatty infiltration of the liver. Cholecystectomy. No calcified stone noted in the central CBD. Mild central intrahepatic biliary ductal dilatation. Pancreas: Unremarkable. No pancreatic ductal dilatation or surrounding inflammatory changes. Spleen: Normal in size without focal abnormality. Adrenals/Urinary Tract: The adrenal glands are unremarkable. Mild bilateral renal parenchyma atrophy and cortical thinning and irregularity likely related to prior scarring. There is no hydronephrosis on either side. There is symmetric enhancement and excretion of contrast by both kidneys. The visualized ureters and urinary bladder appear unremarkable. Stomach/Bowel: The stomach is distended with oral content and air. Multiple dilated loops of small bowel with air-fluid levels measure up to 4.7 cm in diameter. There is a transition point in the lower abdomen (series 2, image 80 and coronal series 5, image 36) were there is tethering of multiple loops of adjacent small bowel likely secondary to adhesions. There is sigmoid diverticulosis without active inflammatory changes. Appendectomy. Vascular/Lymphatic: Moderate aortoiliac atherosclerotic disease. No portal venous gas. There is no adenopathy. Reproductive: Hysterectomy. No pelvic mass. Other: Midline vertical anterior pelvic wall incisional scar. Musculoskeletal: Degenerative changes of the spine and multilevel facet arthropathy. Total left hip arthroplasty. Grade 1 L4-L5 anterolisthesis. No acute osseous pathology. IMPRESSION: 1. Small-bowel obstruction with transition point in the lower abdomen, likely secondary to adhesions. 2. Sigmoid diverticulosis without active inflammatory changes. Electronically Signed   By: Anner Crete M.D.   On: 12/01/2018 05:29   Dg Abdomen Acute W/chest  Result Date: 12/01/2018 CLINICAL DATA:  67 year old female  with abdominal pain and vomiting. EXAM: DG ABDOMEN ACUTE W/ 1V CHEST COMPARISON:  CT of the abdomen pelvis dated 12/01/2018 FINDINGS: The lungs are clear. There is no pleural effusion or pneumothorax. The cardiac silhouette is within normal limits. Dilated loops of small bowel with air-fluid levels most consistent with small-bowel obstruction. No free air. The osseous structures and soft tissues are grossly unremarkable. IMPRESSION: 1. Small-bowel obstruction. 2. No acute cardiopulmonary process. Electronically Signed   By: Anner Crete M.D.   On: 12/01/2018 05:31    Procedures Procedures (including critical care time)  Medications Ordered in ED Medications  ondansetron (ZOFRAN) injection 4 mg (4 mg Intravenous Given 12/01/18 0247)  0.9 %  sodium chloride infusion (1,000 mLs Intravenous New Bag/Given 12/01/18 0246)     Initial Impression / Assessment and Plan / ED Course  I have reviewed the triage vital signs and the nursing notes.  Pertinent labs & imaging results that were available during my care of the patient were reviewed by me and considered in my medical decision making (see chart for details).    Recurrent abdominal pain with nausea and vomiting similar to previous bowel obstruction.  Patient given IV fluids and symptom control. Labs show leukocytosis.  X-ray is concerning for small bowel obstruction without perforation.  Hypokalemia repleted.  No further vomiting during ED stay other than on arrival.  Small bowel obstruction discussed with Dr. Harlow Asa general surgery.  He agrees with IV hydration and NG tube if patient will accept this though she is hesitant.  Patient to be transferred to [facility] long ED to see general surgery.  Discussed with Dr. Tomi Bamberger in the ED.  Final Clinical Impressions(s) / ED Diagnoses   Final diagnoses:  Small bowel obstruction [facility])    ED Discharge Orders    None       Ezequiel Essex, MD 12/01/18 [id number]    Ezequiel Essex,  MD 12/01/18 [phone]

## 2018-12-01 NOTE — ED Provider Notes (Signed)
Pt is here as a transfer from [facility].  Pt has a hx of SBO and has another one today as seen on CT.  NG was placed prior to transfer.  Pt's pain has improved.  Pt d/w surgery who will see pt in consult.  They request hospitalist to admit.  Pt d/w Dr. Dyann Kief ([facility]ad) who will admit.   Isla Pence, MD 12/01/18 1038

## 2018-12-01 NOTE — ED Triage Notes (Signed)
Pt reports abd pain and nausea, no vomiting, since Sunday 11/29/18. Pt feels this is the same thing that happened to her 2-3 years ago when she had a bowel obstruction.

## 2018-12-02 ENCOUNTER — Inpatient Hospital Stay (HOSPITAL_COMMUNITY): Payer: Medicare Other

## 2018-12-02 LAB — CBC
HEMATOCRIT: 38.5 % (ref 36.0–46.0)
Hemoglobin: 12.1 g/dL (ref 12.0–15.0)
MCH: 31.3 pg (ref 26.0–34.0)
MCHC: 31.4 g/dL (ref 30.0–36.0)
MCV: 99.7 fL (ref 80.0–100.0)
PLATELETS: 158 10*3/uL (ref 150–400)
RBC: 3.86 MIL/uL — ABNORMAL LOW (ref 3.87–5.11)
RDW: 13.8 % (ref 11.5–15.5)
WBC: 7.5 10*3/uL (ref 4.0–10.5)
nRBC: 0 % (ref 0.0–0.2)

## 2018-12-02 LAB — BASIC METABOLIC PANEL
Anion gap: 8 (ref 5–15)
BUN: 12 mg/dL (ref 8–23)
CO2: 23 mmol/L (ref 22–32)
Calcium: 8.5 mg/dL — ABNORMAL LOW (ref 8.9–10.3)
Chloride: 117 mmol/L — ABNORMAL HIGH (ref 98–111)
Creatinine, Ser: 0.79 mg/dL (ref 0.44–1.00)
GFR calc Af Amer: >60 mL/min (ref >60)
GFR calc non Af Amer: >60 mL/min (ref >60)
Glucose, Bld: 92 mg/dL (ref 70–99)
POTASSIUM: 3.9 mmol/L (ref 3.5–5.1)
Sodium: 148 mmol/L — ABNORMAL HIGH (ref 135–145)

## 2018-12-02 LAB — URINALYSIS, ROUTINE W REFLEX MICROSCOPIC
BILIRUBIN URINE: NEGATIVE
Glucose, UA: NEGATIVE mg/dL
Hgb urine dipstick: NEGATIVE
Ketones, ur: 20 mg/dL — AB
Nitrite: NEGATIVE
Protein, ur: 30 mg/dL — AB
SPECIFIC GRAVITY, URINE: 1.02 (ref 1.005–1.030)
pH: 5 (ref 5.0–8.0)

## 2018-12-02 LAB — GLUCOSE, CAPILLARY
GLUCOSE-CAPILLARY: 96 mg/dL (ref 70–99)
Glucose-Capillary: 74 mg/dL (ref 70–99)
Glucose-Capillary: 77 mg/dL (ref 70–99)
Glucose-Capillary: 82 mg/dL (ref 70–99)

## 2018-12-02 LAB — HIV ANTIBODY (ROUTINE TESTING W REFLEX): HIV Screen 4th Generation wRfx: NONREACTIVE

## 2018-12-02 MED ORDER — DIATRIZOATE MEGLUMINE & SODIUM 66-10 % PO SOLN
90.0000 mL | Freq: Once | ORAL | Status: AC
  Administered 2018-12-02: 90 mL via NASOGASTRIC
  Filled 2018-12-02: qty 90

## 2018-12-02 NOTE — Progress Notes (Signed)
Patient ID: LADIAMOND GALLINA, female   DOB: 09/08/1951, 67 y.o.   MRN: [id number]       Subjective: Patient just had another watery BM, but still feels bloated.  NGT output has turned more bilious.  C/o foul smelling urine.  Objective: Vital signs in last 24 hours: Temp:  [98 F (36.7 C)-98.2 F (36.8 C)] 98.2 F (36.8 C) (12/11 0537) Pulse Rate:  [66-73] 72 (12/11 0537) Resp:  [15-18] 15 (12/11 0537) BP: (119-145)/(61-103) 141/77 (12/11 0537) SpO2:  [90 %-96 %] 92 % (12/11 0537) Last BM Date: 12/02/18  Intake/Output from previous day: 12/10 0701 - 12/11 0700 In: 1979.1 [P.O.:30; I.V.:1560.3; IV Piggyback:388.9] Out: 440 [Urine:400; Emesis/NG output:40] Intake/Output this shift: Total I/O In: 0  Out: 300 [Emesis/NG output:300]  PE: Heart: regular Lungs: CTAB Abd: soft, maybe some bloating in her upper abdomen, most bowel sounds heard are from NGT suction.  NGT output has been around 350cc since 0530am this morning.  Not really tender to palpation.  Lab Results:  Recent Labs    12/01/18 0225 12/01/18 0243 12/02/18 0511  WBC 12.1*  --  7.5  HGB 15.2* 16.3* 12.1  HCT 46.5* 48.0* 38.5  PLT 232  --  158   BMET Recent Labs    12/01/18 0225 12/01/18 0243 12/02/18 0511  NA 143 144 148*  K 3.1* 3.1* 3.9  CL 108 107 117*  CO2 23  --  23  GLUCOSE 148* 147* 92  BUN 17 16 12   CREATININE 0.81 0.90 0.79  CALCIUM 9.6  --  8.5*   PT/INR No results for input(s): LABPROT, INR in the last 72 hours. CMP     Component Value Date/Time   NA 148 (H) 12/02/2018 0511   K 3.9 12/02/2018 0511   CL 117 (H) 12/02/2018 0511   CO2 23 12/02/2018 0511   GLUCOSE 92 12/02/2018 0511   BUN 12 12/02/2018 0511   CREATININE 0.79 12/02/2018 0511   CREATININE 1.08 04/25/2014 1104   CALCIUM 8.5 (L) 12/02/2018 0511   PROT 7.2 12/01/2018 0225   ALBUMIN 4.5 12/01/2018 0225   AST 27 12/01/2018 0225   ALT 38 12/01/2018 0225   ALKPHOS 102 12/01/2018 0225   BILITOT 0.6 12/01/2018 0225   GFRNONAA >60 12/02/2018 0511   GFRNONAA 57 (L) 10/25/2013 1042   GFRAA >60 12/02/2018 0511   GFRAA 66 10/25/2013 1042   Lipase     Component Value Date/Time   LIPASE 31 12/01/2018 0225       Studies/Results: Ct Abdomen Pelvis W Contrast  Result Date: 12/01/2018 CLINICAL DATA:  67 year old female with abdominal pain. EXAM: CT ABDOMEN AND PELVIS WITH CONTRAST TECHNIQUE: Multidetector CT imaging of the abdomen and pelvis was performed using the standard protocol following bolus administration of intravenous contrast. CONTRAST:  159mL ISOVUE-300 IOPAMIDOL (ISOVUE-300) INJECTION 61% COMPARISON:  CT of the abdomen pelvis dated 07/27/2016 FINDINGS: Lower chest: The visualized lungs are clear. There is coronary vascular calcification. No intra-abdominal free air. Trace free fluid within the pelvis. Hepatobiliary: Probable mild fatty infiltration of the liver. Cholecystectomy. No calcified stone noted in the central CBD. Mild central intrahepatic biliary ductal dilatation. Pancreas: Unremarkable. No pancreatic ductal dilatation or surrounding inflammatory changes. Spleen: Normal in size without focal abnormality. Adrenals/Urinary Tract: The adrenal glands are unremarkable. Mild bilateral renal parenchyma atrophy and cortical thinning and irregularity likely related to prior scarring. There is no hydronephrosis on either side. There is symmetric enhancement and excretion of contrast by both kidneys. The visualized ureters  and urinary bladder appear unremarkable. Stomach/Bowel: The stomach is distended with oral content and air. Multiple dilated loops of small bowel with air-fluid levels measure up to 4.7 cm in diameter. There is a transition point in the lower abdomen (series 2, image 80 and coronal series 5, image 36) were there is tethering of multiple loops of adjacent small bowel likely secondary to adhesions. There is sigmoid diverticulosis without active inflammatory changes. Appendectomy.  Vascular/Lymphatic: Moderate aortoiliac atherosclerotic disease. No portal venous gas. There is no adenopathy. Reproductive: Hysterectomy. No pelvic mass. Other: Midline vertical anterior pelvic wall incisional scar. Musculoskeletal: Degenerative changes of the spine and multilevel facet arthropathy. Total left hip arthroplasty. Grade 1 L4-L5 anterolisthesis. No acute osseous pathology. IMPRESSION: 1. Small-bowel obstruction with transition point in the lower abdomen, likely secondary to adhesions. 2. Sigmoid diverticulosis without active inflammatory changes. Electronically Signed   By: Anner Crete M.D.   On: 12/01/2018 05:29   Dg Chest Portable 1 View  Result Date: 12/01/2018 CLINICAL DATA:  Nasogastric tube placement EXAM: PORTABLE CHEST 1 VIEW COMPARISON:  Abdominal CT earlier today FINDINGS: Nasogastric tube with tip at least reaching the stomach. Stable low volumes. There is no edema, consolidation, effusion, or pneumothorax. Normal heart size and mediastinal contours. IMPRESSION: Nasogastric tube with tip at least reaching the stomach. Electronically Signed   By: Monte Fantasia M.D.   On: 12/01/2018 08:23   Dg Abdomen Acute W/chest  Result Date: 12/01/2018 CLINICAL DATA:  67 year old female with abdominal pain and vomiting. EXAM: DG ABDOMEN ACUTE W/ 1V CHEST COMPARISON:  CT of the abdomen pelvis dated 12/01/2018 FINDINGS: The lungs are clear. There is no pleural effusion or pneumothorax. The cardiac silhouette is within normal limits. Dilated loops of small bowel with air-fluid levels most consistent with small-bowel obstruction. No free air. The osseous structures and soft tissues are grossly unremarkable. IMPRESSION: 1. Small-bowel obstruction. 2. No acute cardiopulmonary process. Electronically Signed   By: Anner Crete M.D.   On: 12/01/2018 05:31   Dg Abd Portable 1v-small Bowel Obstruction Protocol-initial, 8 Hr Delay  Result Date: 12/01/2018 CLINICAL DATA:  67 y/o  F; 8 hour  small bowel protocol. EXAM: PORTABLE ABDOMEN - 1 VIEW COMPARISON:  12/01/2018 CT abdomen and pelvis. FINDINGS: Persistent diffuse small bowel obstruction. Coiling enteric tube with tip projecting over gastric body. Leftward curvature and degenerative changes of the lumbar spine. IMPRESSION: Persistent diffuse small bowel obstruction similar to CT given differences in technique. Electronically Signed   By: Kristine Garbe M.D.   On: 12/01/2018 22:37    Anti-infectives: Anti-infectives (From admission, onward)   None       Assessment/Plan SBO -SBO protocol follow up film doesn't show any contrast.  Will reinitiate the protocol again.  Films this morning still show dilated loops of small bowel.  She does have some air in her colon and has had another watery BM.  She likely has a pSBO currently, but still not resolved enough to DC her NGT etc. -clamp NGT to ambulate and mobilize.   FEN - NPO/NGT/IVFs VTE - heparin ID - none needed from our standpoint.  Defer to medicine regarding complaints of possible UTI symptoms.  UA did have some bacteria present.   LOS: 1 day    Henreitta Cea , [facility] Surgery 12/02/2018, 10:49 AM Pager: [phone]

## 2018-12-02 NOTE — Progress Notes (Signed)
PROGRESS NOTE    Jordan [name]  PJK:[id number] DOB: 12-Apr-1951 DOA: 12/01/2018 PCP: [name], NP    Brief Narrative:  Jordan [name] is a 67 y.o. female 67 year old female with a past medical history significant for hypertension, hyperlipidemia, hyperglycemia, gastroesophageal reflux disease, tobacco abuse and morbid obesity; who presented to the emergency department secondary to abdominal pain, nausea and vomiting.  Patient reports symptoms started on 11/29/2018 and despite slight improvement at nighttime and Sunday they started to worsen again since Monday midday.  Pain is localized in the middle section and lower part of her abdomen, not radiated, constant, and also with decreased appetite.    In ED evaluation has demonstrated mild hypokalemia and CT abdomen/abdominal x-ray 8 demonstrating SBO with transition point.  General surgery was consulted, IV fluids initiated an NG tube placed.  Patient also receive IV potassium, pain medication and antiemetics.    Assessment & Plan:   Principal Problem:   SBO (small bowel obstruction) (HCC) Active Problems:   Hyperlipidemia   Obesity, Class III, BMI 40-49.9 (morbid obesity) (HCC)   Depression   Essential hypertension   Hyperglycemia   GERD (gastroesophageal reflux disease)   Hypokalemia   Tobacco abuse   1-abdominal pain, nausea and vomiting: In the setting of acute SBO (small bowel obstruction) ([place]). -Appears to be associated with adhesions as transition point was seen on CT abdomen (patient has history of multiple abdominal surgeries in the past). -She experienced an episode of SBO about 2 years ago and expressed that her current symptoms are very similar to prior presentation. -General surgery has been consulted and will follow further recommendation -Conservative management has been initiated with NG tube placement (intermittent suction), IV fluid resuscitation and electrolyte repletion -Continue as needed analgesics and  antiemetics. -Patient is n.p.o. except for ice chips.  2-hyperlipidemia -No p.o. medications will be given at this moment -Follow lipid panel as an outpatient -Prior to admission was not using any statins.  3-Obesity, Class III, BMI 40-49.9 (morbid obesity) (HCC) -Body mass index is 41.88 kg/m. -Low calorie diet once she can start eating, portion control and increase physical activity, discussed with patient.  4-Depression -Stable mood currently -No suicidal ideation or hallucination -Plan is to resume Cymbalta once able to take by mouth.  5-Essential hypertension -Will hold oral antihypertensive agents while n.p.o. -Start IV metoprolol -Follow vital signs and adjust management as needed. -Pain most likely playing a role in blood pressure elevation.  6-Hyperglycemia -Most recent A1c September 2019 (6.2) -Has been advised to follow modified carbohydrate diet and to lose weight. -Outpatient follow-up with PCP to further monitor her blood sugar levels and initiate hypoglycemic regimen when appropriate.  7-GERD (gastroesophageal reflux disease) -Started on Protonix IV -Resume oral PPI when taken by mouth.  8-Hypokalemia -Due to poor oral intake and ongoing vomiting prior to admission. -Home use of diuretics most likely contributing as well with her low potassium -Will replete potassium IV and will check magnesium and phosphorus level.  9-Tobacco abuse - Counseled on cessation.Total time spent with tobacco counseling was 3 minutes. -Patient declined nicotine patch   DVT prophylaxis: Heparin Code Status: Full Family Communication: No family at bedside Disposition Plan: Home once symptoms improve   Consultants:   Surgery  Procedures:   None   Antimicrobials:   None    Subjective: Has NG tube in place.  Denies having any nausea or abdominal pain.  Objective: Vitals:   12/01/18 2110 12/01/18 2144 12/02/18 0537 12/02/18 1317  BP: 140/79 139/75 Marland Kitchen)  141/77 (!) 141/81  Pulse: 66 66 72 69  Resp:  15 15 18   Temp:  98.2 F (36.8 C) 98.2 F (36.8 C) 98 F (36.7 C)  TempSrc:  Oral Oral Oral  SpO2:  90% 92% 98%  Weight:      Height:        Intake/Output Summary (Last 24 hours) at 12/02/2018 1647 Last data filed at 12/02/2018 1415 Gross per 24 hour  Intake 2704 ml  Output 1740 ml  Net 964 ml   Filed Weights   12/01/18 0222  Weight: 110.7 kg    Examination:  General exam: Appears calm and comfortable  Respiratory system: Clear to auscultation. Respiratory effort normal. Cardiovascular system: S1 & S2 heard, RRR. No JVD, murmurs, rubs, gallops or clicks. No pedal edema. Gastrointestinal system: Abdomen is nondistended, soft and nontender. No organomegaly or masses felt. Diminished bowel sounds. Central nervous system: Alert and oriented. No focal neurological deficits. Extremities: Symmetric 5 x 5 power. Skin: No rashes, lesions or ulcers Psychiatry: Judgement and insight appear normal. Mood & affect appropriate.     Data Reviewed: I have personally reviewed following labs and imaging studies  CBC: Recent Labs  Lab 12/01/18 0225 12/01/18 0243 12/02/18 0511  WBC 12.1*  --  7.5  HGB 15.2* 16.3* 12.1  HCT 46.5* 48.0* 38.5  MCV 94.5  --  99.7  PLT 232  --  220   Basic Metabolic Panel: Recent Labs  Lab 12/01/18 0225 12/01/18 0243 12/01/18 1540 12/02/18 0511  NA 143 144  --  148*  K 3.1* 3.1*  --  3.9  CL 108 107  --  117*  CO2 23  --   --  23  GLUCOSE 148* 147*  --  92  BUN 17 16  --  12  CREATININE 0.81 0.90  --  0.79  CALCIUM 9.6  --   --  8.5*  MG  --   --  1.7  --   PHOS  --   --  3.5  --    GFR: Estimated Creatinine Clearance: 81.9 mL/min (by C-G formula based on SCr of 0.79 mg/dL). Liver Function Tests: Recent Labs  Lab 12/01/18 0225  AST 27  ALT 38  ALKPHOS 102  BILITOT 0.6  PROT 7.2  ALBUMIN 4.5   Recent Labs  Lab 12/01/18 0225  LIPASE 31   No results for input(s): AMMONIA in the  last 168 hours. Coagulation Profile: No results for input(s): INR, PROTIME in the last 168 hours. Cardiac Enzymes: No results for input(s): CKTOTAL, CKMB, CKMBINDEX, TROPONINI in the last 168 hours. BNP (last 3 results) No results for input(s): PROBNP in the last 8760 hours. HbA1C: No results for input(s): HGBA1C in the last 72 hours. CBG: Recent Labs  Lab 12/01/18 0742 12/01/18 2340 12/02/18 0633 12/02/18 1120  GLUCAP 102* 97 96 82   Lipid Profile: No results for input(s): CHOL, HDL, LDLCALC, TRIG, CHOLHDL, LDLDIRECT in the last 72 hours. Thyroid Function Tests: No results for input(s): TSH, T4TOTAL, FREET4, T3FREE, THYROIDAB in the last 72 hours. Anemia Panel: No results for input(s): VITAMINB12, FOLATE, FERRITIN, TIBC, IRON, RETICCTPCT in the last 72 hours. Sepsis Labs: Recent Labs  Lab 12/01/18 0241  LATICACIDVEN 1.41    No results found for this or any previous visit (from the past 240 hour(s)).       Radiology Studies: Ct Abdomen Pelvis W Contrast  Result Date: 12/01/2018 CLINICAL DATA:  67 year old female with abdominal pain. EXAM: CT ABDOMEN  AND PELVIS WITH CONTRAST TECHNIQUE: Multidetector CT imaging of the abdomen and pelvis was performed using the standard protocol following bolus administration of intravenous contrast. CONTRAST:  122mL ISOVUE-300 IOPAMIDOL (ISOVUE-300) INJECTION 61% COMPARISON:  CT of the abdomen pelvis dated 07/27/2016 FINDINGS: Lower chest: The visualized lungs are clear. There is coronary vascular calcification. No intra-abdominal free air. Trace free fluid within the pelvis. Hepatobiliary: Probable mild fatty infiltration of the liver. Cholecystectomy. No calcified stone noted in the central CBD. Mild central intrahepatic biliary ductal dilatation. Pancreas: Unremarkable. No pancreatic ductal dilatation or surrounding inflammatory changes. Spleen: Normal in size without focal abnormality. Adrenals/Urinary Tract: The adrenal glands are  unremarkable. Mild bilateral renal parenchyma atrophy and cortical thinning and irregularity likely related to prior scarring. There is no hydronephrosis on either side. There is symmetric enhancement and excretion of contrast by both kidneys. The visualized ureters and urinary bladder appear unremarkable. Stomach/Bowel: The stomach is distended with oral content and air. Multiple dilated loops of small bowel with air-fluid levels measure up to 4.7 cm in diameter. There is a transition point in the lower abdomen (series 2, image 80 and coronal series 5, image 36) were there is tethering of multiple loops of adjacent small bowel likely secondary to adhesions. There is sigmoid diverticulosis without active inflammatory changes. Appendectomy. Vascular/Lymphatic: Moderate aortoiliac atherosclerotic disease. No portal venous gas. There is no adenopathy. Reproductive: Hysterectomy. No pelvic mass. Other: Midline vertical anterior pelvic wall incisional scar. Musculoskeletal: Degenerative changes of the spine and multilevel facet arthropathy. Total left hip arthroplasty. Grade 1 L4-L5 anterolisthesis. No acute osseous pathology. IMPRESSION: 1. Small-bowel obstruction with transition point in the lower abdomen, likely secondary to adhesions. 2. Sigmoid diverticulosis without active inflammatory changes. Electronically Signed   By: Anner Crete M.D.   On: 12/01/2018 05:29   Dg Chest Portable 1 View  Result Date: 12/01/2018 CLINICAL DATA:  Nasogastric tube placement EXAM: PORTABLE CHEST 1 VIEW COMPARISON:  Abdominal CT earlier today FINDINGS: Nasogastric tube with tip at least reaching the stomach. Stable low volumes. There is no edema, consolidation, effusion, or pneumothorax. Normal heart size and mediastinal contours. IMPRESSION: Nasogastric tube with tip at least reaching the stomach. Electronically Signed   By: Monte Fantasia M.D.   On: 12/01/2018 08:23   Dg Abdomen Acute W/chest  Result Date:  12/01/2018 CLINICAL DATA:  67 year old female with abdominal pain and vomiting. EXAM: DG ABDOMEN ACUTE W/ 1V CHEST COMPARISON:  CT of the abdomen pelvis dated 12/01/2018 FINDINGS: The lungs are clear. There is no pleural effusion or pneumothorax. The cardiac silhouette is within normal limits. Dilated loops of small bowel with air-fluid levels most consistent with small-bowel obstruction. No free air. The osseous structures and soft tissues are grossly unremarkable. IMPRESSION: 1. Small-bowel obstruction. 2. No acute cardiopulmonary process. Electronically Signed   By: Anner Crete M.D.   On: 12/01/2018 05:31   Dg Abd Portable 1v-small Bowel Obstruction Protocol-initial, 8 Hr Delay  Result Date: 12/01/2018 CLINICAL DATA:  67 y/o  F; 8 hour small bowel protocol. EXAM: PORTABLE ABDOMEN - 1 VIEW COMPARISON:  12/01/2018 CT abdomen and pelvis. FINDINGS: Persistent diffuse small bowel obstruction. Coiling enteric tube with tip projecting over gastric body. Leftward curvature and degenerative changes of the lumbar spine. IMPRESSION: Persistent diffuse small bowel obstruction similar to CT given differences in technique. Electronically Signed   By: Kristine Garbe M.D.   On: 12/01/2018 22:37        Scheduled Meds: . heparin  5,000 Units Subcutaneous Q8H  .  metoprolol tartrate  5 mg Intravenous Q8H  . pantoprazole (PROTONIX) IV  40 mg Intravenous Q24H   Continuous Infusions: . 0.9 % NaCl with KCl 40 mEq / L 100 mL/hr (12/02/18 1155)     LOS: 1 day    Time spent: 53min    Yaakov Guthrie MD [facility] Please check amion for pager  If 7PM-7AM, please contact night-coverage [email or web address] Password [facility] 12/02/2018, 4:47 PM

## 2018-12-03 LAB — CBC
HCT: 38.6 % (ref 36.0–46.0)
Hemoglobin: 12.1 g/dL (ref 12.0–15.0)
MCH: 31.3 pg (ref 26.0–34.0)
MCHC: 31.3 g/dL (ref 30.0–36.0)
MCV: 99.7 fL (ref 80.0–100.0)
NRBC: 0 % (ref 0.0–0.2)
PLATELETS: 153 10*3/uL (ref 150–400)
RBC: 3.87 MIL/uL (ref 3.87–5.11)
RDW: 13.5 % (ref 11.5–15.5)
WBC: 6.8 10*3/uL (ref 4.0–10.5)

## 2018-12-03 LAB — BASIC METABOLIC PANEL
Anion gap: 7 (ref 5–15)
BUN: 13 mg/dL (ref 8–23)
CO2: 25 mmol/L (ref 22–32)
Calcium: 9 mg/dL (ref 8.9–10.3)
Chloride: 117 mmol/L — ABNORMAL HIGH (ref 98–111)
Creatinine, Ser: 0.75 mg/dL (ref 0.44–1.00)
GFR calc Af Amer: >60 mL/min (ref >60)
GFR calc non Af Amer: >60 mL/min (ref >60)
Glucose, Bld: 91 mg/dL (ref 70–99)
Potassium: 3.8 mmol/L (ref 3.5–5.1)
Sodium: 149 mmol/L — ABNORMAL HIGH (ref 135–145)

## 2018-12-03 LAB — GLUCOSE, CAPILLARY
Glucose-Capillary: 93 mg/dL (ref 70–99)
Glucose-Capillary: 80 mg/dL (ref 70–99)
Glucose-Capillary: 70 mg/dL (ref 70–99)

## 2018-12-03 MED ORDER — SODIUM CHLORIDE 0.9 % IV SOLN
1.0000 g | INTRAVENOUS | Status: AC
  Administered 2018-12-03 – 2018-12-05 (×3): 1 g via INTRAVENOUS
  Filled 2018-12-03 (×2): qty 10
  Filled 2018-12-03 (×2): qty 1

## 2018-12-03 NOTE — Progress Notes (Signed)
PROGRESS NOTE    Jordan [name]  JQB:[id number] DOB: 11-03-1951 DOA: 12/01/2018 PCP: [name], NP    Brief Narrative:  Jordan [name] is a 67 y.o. female 67 year old female with a past medical history significant for hypertension, hyperlipidemia, hyperglycemia, gastroesophageal reflux disease, tobacco abuse and morbid obesity; who presented to the emergency department secondary to abdominal pain, nausea and vomiting.  Patient reports symptoms started on 11/29/2018 and despite slight improvement at nighttime and Sunday they started to worsen again since Monday midday.  Pain is localized in the middle section and lower part of her abdomen, not radiated, constant, and also with decreased appetite.    In ED evaluation has demonstrated mild hypokalemia and CT abdomen/abdominal x-ray 8 demonstrating SBO with transition point.  General surgery was consulted, IV fluids initiated an NG tube placed.  Patient also received IV potassium, pain medication and antiemetics.    Assessment & Plan:   Principal Problem:   SBO (small bowel obstruction) (HCC) Active Problems:   Hyperlipidemia   Obesity, Class III, BMI 40-49.9 (morbid obesity) (HCC)   Depression   Essential hypertension   Hyperglycemia   GERD (gastroesophageal reflux disease)   Hypokalemia   Tobacco abuse   1-abdominal pain, nausea and vomiting: In the setting of acute SBO (small bowel obstruction) ([place]). -Appears to be associated with adhesions as transition point was seen on CT abdomen (patient has history of multiple abdominal surgeries in the past). -She experienced an episode of SBO about 2 years ago and expressed that her current symptoms are very similar to prior presentation. -General surgery has been consulted and will follow further recommendation -Conservative management has been initiated with NG tube placement (intermittent suction), IV fluid resuscitation and electrolyte repletion -Continue as needed analgesics and  antiemetics. -Patient is n.p.o. except for ice chips. 12/03/18: -Abdominal x-ray does not show any significant change in the dilated small bowel. -Continue conservative management for now per surgery.  2-hyperlipidemia -No p.o. medications will be given at this moment -Follow lipid panel as an outpatient -Prior to admission was not using any statins.  3-Obesity, Class III, BMI 40-49.9 (morbid obesity) (HCC) -Body mass index is 41.88 kg/m. -Low calorie diet once she can start eating, portion control and increase physical activity, discussed with patient.  4-Depression -Stable mood currently -No suicidal ideation or hallucination -Plan is to resume Cymbalta once able to take by mouth.  5-Essential hypertension -Will hold oral antihypertensive agents while n.p.o. -Start IV metoprolol -Follow vital signs and adjust management as needed. -Pain most likely playing a role in blood pressure elevation. 12/03/18:  -Patient still n.p.o.  Continue to monitor blood pressure and adjust medications.  6-Hyperglycemia -Most recent A1c September 2019 (6.2) -Has been advised to follow modified carbohydrate diet and to lose weight. -Outpatient follow-up with PCP to further monitor her blood sugar levels and initiate hypoglycemic regimen when appropriate.  7-GERD (gastroesophageal reflux disease) -Started on Protonix IV -Resume oral PPI when taken by mouth.  8-Hypokalemia -Due to poor oral intake and ongoing vomiting prior to admission. -Home use of diuretics most likely contributing as well with her low potassium -Will replete potassium IV and will check magnesium and phosphorus level. 12/03/18: -Potassium repleted.  9-Tobacco abuse - Counseled on cessation. -Patient declined nicotine patch  UTI/cystitis: -Patient complaining of urine odor, change in color. -UA showing positive leukocyte esterase. -We will start her on IV ceftriaxone x3 doses.   DVT prophylaxis:  Heparin Code Status: Full Family Communication: No family at bedside Disposition Plan:  Home once symptoms improve   Consultants:   Surgery  Procedures:   None   Antimicrobials:   None    Subjective: Has NG tube in place.  Denies having any nausea or abdominal pain.  Objective: Vitals:   12/02/18 0537 12/02/18 1317 12/02/18 2138 12/03/18 0534  BP: (!) 141/77 (!) 141/81 (!) 141/75 (!) 146/78  Pulse: 72 69 68 65  Resp: 15 18 16 16   Temp: 98.2 F (36.8 C) 98 F (36.7 C) 97.6 F (36.4 C) 97.6 F (36.4 C)  TempSrc: Oral Oral Axillary Oral  SpO2: 92% 98% 97% 99%  Weight:      Height:        Intake/Output Summary (Last 24 hours) at 12/03/2018 1204 Last data filed at 12/03/2018 1000 Gross per 24 hour  Intake 3520.01 ml  Output 4175 ml  Net -654.99 ml   Filed Weights   12/01/18 0222  Weight: 110.7 kg    Examination:  General exam: Appears calm and comfortable  Respiratory system: Clear to auscultation. Respiratory effort normal. Cardiovascular system: S1 & S2 heard, RRR. No JVD, murmurs, rubs, gallops or clicks. No pedal edema. Gastrointestinal system: Abdomen is nondistended, soft and nontender. No organomegaly or masses felt. Diminished bowel sounds. Central nervous system: Alert and oriented. No focal neurological deficits. Extremities: Symmetric 5 x 5 power. Skin: No rashes, lesions or ulcers Psychiatry: Judgement and insight appear normal. Mood & affect appropriate.     Data Reviewed: I have personally reviewed following labs and imaging studies  CBC: Recent Labs  Lab 12/01/18 0225 12/01/18 0243 12/02/18 0511 12/03/18 0516  WBC 12.1*  --  7.5 6.8  HGB 15.2* 16.3* 12.1 12.1  HCT 46.5* 48.0* 38.5 38.6  MCV 94.5  --  99.7 99.7  PLT 232  --  158 102   Basic Metabolic Panel: Recent Labs  Lab 12/01/18 0225 12/01/18 0243 12/01/18 1540 12/02/18 0511 12/03/18 0516  NA 143 144  --  148* 149*  K 3.1* 3.1*  --  3.9 3.8  CL 108 107  --  117*  117*  CO2 23  --   --  23 25  GLUCOSE 148* 147*  --  92 91  BUN 17 16  --  12 13  CREATININE 0.81 0.90  --  0.79 0.75  CALCIUM 9.6  --   --  8.5* 9.0  MG  --   --  1.7  --   --   PHOS  --   --  3.5  --   --    GFR: Estimated Creatinine Clearance: 81.9 mL/min (by C-G formula based on SCr of 0.75 mg/dL). Liver Function Tests: Recent Labs  Lab 12/01/18 0225  AST 27  ALT 38  ALKPHOS 102  BILITOT 0.6  PROT 7.2  ALBUMIN 4.5   Recent Labs  Lab 12/01/18 0225  LIPASE 31   No results for input(s): AMMONIA in the last 168 hours. Coagulation Profile: No results for input(s): INR, PROTIME in the last 168 hours. Cardiac Enzymes: No results for input(s): CKTOTAL, CKMB, CKMBINDEX, TROPONINI in the last 168 hours. BNP (last 3 results) No results for input(s): PROBNP in the last 8760 hours. HbA1C: No results for input(s): HGBA1C in the last 72 hours. CBG: Recent Labs  Lab 12/02/18 1120 12/02/18 1710 12/02/18 2337 12/03/18 0459 12/03/18 1140  GLUCAP 82 74 77 93 80   Lipid Profile: No results for input(s): CHOL, HDL, LDLCALC, TRIG, CHOLHDL, LDLDIRECT in the last 72 hours. Thyroid Function Tests:  No results for input(s): TSH, T4TOTAL, FREET4, T3FREE, THYROIDAB in the last 72 hours. Anemia Panel: No results for input(s): VITAMINB12, FOLATE, FERRITIN, TIBC, IRON, RETICCTPCT in the last 72 hours. Sepsis Labs: Recent Labs  Lab 12/01/18 0241  LATICACIDVEN 1.41    No results found for this or any previous visit (from the past 240 hour(s)).       Radiology Studies: Dg Abd Portable 1v-small Bowel Obstruction Protocol-initial, 8 Hr Delay  Result Date: 12/02/2018 CLINICAL DATA:  8 hour delay, small bowel protocol EXAM: PORTABLE ABDOMEN - 1 VIEW COMPARISON:  12/01/2018, CT 12/01/2018 FINDINGS: Esophageal tube is coiled in the proximal stomach. Persistent dilated loops of small bowel measuring up to 5.1 cm without significant interval change. There is gas present in the colon.  Faint contrast is visible within the left colon and rectum. Left hip replacement is noted. IMPRESSION: 1. Esophageal tube tip coiled in the stomach 2. No significant change in dilated small bowel consistent with bowel obstruction. There is gas present within the colon and faint contrast is present in the colon and rectum. Electronically Signed   By: Donavan Foil M.D.   On: 12/02/2018 20:31   Dg Abd Portable 1v-small Bowel Obstruction Protocol-initial, 8 Hr Delay  Result Date: 12/01/2018 CLINICAL DATA:  67 y/o  F; 8 hour small bowel protocol. EXAM: PORTABLE ABDOMEN - 1 VIEW COMPARISON:  12/01/2018 CT abdomen and pelvis. FINDINGS: Persistent diffuse small bowel obstruction. Coiling enteric tube with tip projecting over gastric body. Leftward curvature and degenerative changes of the lumbar spine. IMPRESSION: Persistent diffuse small bowel obstruction similar to CT given differences in technique. Electronically Signed   By: Kristine Garbe M.D.   On: 12/01/2018 22:37        Scheduled Meds: . heparin  5,000 Units Subcutaneous Q8H  . metoprolol tartrate  5 mg Intravenous Q8H  . pantoprazole (PROTONIX) IV  40 mg Intravenous Q24H   Continuous Infusions: . 0.9 % NaCl with KCl 40 mEq / L 100 mL/hr (12/03/18 0704)     LOS: 2 days    Time spent: 61min    Yaakov Guthrie MD [facility] Please check amion for pager  If 7PM-7AM, please contact night-coverage [email or web address] Password [facility] 12/03/2018, 12:04 PM

## 2018-12-03 NOTE — Progress Notes (Signed)
Patient ID: Jordan [name], female   DOB: 1951-11-04, 67 y.o.   MRN: [id number]       Subjective: Patient is eating a ton of ice.  C/o dry mouth otherwise.  Had a couple of loose BMs, but small.  Objective: Vital signs in last 24 hours: Temp:  [97.6 F (36.4 C)-98 F (36.7 C)] 97.6 F (36.4 C) (12/12 0534) Pulse Rate:  [65-69] 65 (12/12 0534) Resp:  [16-18] 16 (12/12 0534) BP: (141-146)/(75-81) 146/78 (12/12 0534) SpO2:  [97 %-99 %] 99 % (12/12 0534) Last BM Date: 12/02/18  Intake/Output from previous day: 12/11 0701 - 12/12 0700 In: 3078.3 [P.O.:720; I.V.:2358.3] Out: 4332 [Urine:1300; Emesis/NG RJJOAC:1660] Intake/Output this shift: Total I/O In: 441.7 [I.V.:441.7] Out: 500 [Emesis/NG output:500]  PE: Heart: regular Lungs: CTAB Abd: soft, obese, some BS, NGT with copious watered down output.  Lab Results:  Recent Labs    12/02/18 0511 12/03/18 0516  WBC 7.5 6.8  HGB 12.1 12.1  HCT 38.5 38.6  PLT 158 153   BMET Recent Labs    12/02/18 0511 12/03/18 0516  NA 148* 149*  K 3.9 3.8  CL 117* 117*  CO2 23 25  GLUCOSE 92 91  BUN 12 13  CREATININE 0.79 0.75  CALCIUM 8.5* 9.0   PT/INR No results for input(s): LABPROT, INR in the last 72 hours. CMP     Component Value Date/Time   NA 149 (H) 12/03/2018 0516   K 3.8 12/03/2018 0516   CL 117 (H) 12/03/2018 0516   CO2 25 12/03/2018 0516   GLUCOSE 91 12/03/2018 0516   BUN 13 12/03/2018 0516   CREATININE 0.75 12/03/2018 0516   CREATININE 1.08 04/25/2014 1104   CALCIUM 9.0 12/03/2018 0516   PROT 7.2 12/01/2018 0225   ALBUMIN 4.5 12/01/2018 0225   AST 27 12/01/2018 0225   ALT 38 12/01/2018 0225   ALKPHOS 102 12/01/2018 0225   BILITOT 0.6 12/01/2018 0225   GFRNONAA >60 12/03/2018 0516   GFRNONAA 57 (L) 10/25/2013 1042   GFRAA >60 12/03/2018 0516   GFRAA 66 10/25/2013 1042   Lipase     Component Value Date/Time   LIPASE 31 12/01/2018 0225       Studies/Results: Dg Abd Portable 1v-small Bowel  Obstruction Protocol-initial, 8 Hr Delay  Result Date: 12/02/2018 CLINICAL DATA:  8 hour delay, small bowel protocol EXAM: PORTABLE ABDOMEN - 1 VIEW COMPARISON:  12/01/2018, CT 12/01/2018 FINDINGS: Esophageal tube is coiled in the proximal stomach. Persistent dilated loops of small bowel measuring up to 5.1 cm without significant interval change. There is gas present in the colon. Faint contrast is visible within the left colon and rectum. Left hip replacement is noted. IMPRESSION: 1. Esophageal tube tip coiled in the stomach 2. No significant change in dilated small bowel consistent with bowel obstruction. There is gas present within the colon and faint contrast is present in the colon and rectum. Electronically Signed   By: Donavan Foil M.D.   On: 12/02/2018 20:31   Dg Abd Portable 1v-small Bowel Obstruction Protocol-initial, 8 Hr Delay  Result Date: 12/01/2018 CLINICAL DATA:  67 y/o  F; 8 hour small bowel protocol. EXAM: PORTABLE ABDOMEN - 1 VIEW COMPARISON:  12/01/2018 CT abdomen and pelvis. FINDINGS: Persistent diffuse small bowel obstruction. Coiling enteric tube with tip projecting over gastric body. Leftward curvature and degenerative changes of the lumbar spine. IMPRESSION: Persistent diffuse small bowel obstruction similar to CT given differences in technique. Electronically Signed   By: Kristine Garbe  M.D.   On: 12/01/2018 22:37    Anti-infectives: Anti-infectives (From admission, onward)   None       Assessment/Plan SBO -repeated SBO protocol.  Some contrast in noted in her rectum but her small bowel is clearly still dilated and c/w with a high grade pSBO. -cont NGT for now and repeat films in the morning.  Given multitude of open surgeries, we would like to avoid surgical intervention if possible.  Cont conservative management, but if she doesn't improve over the next several days, she will likely require an operation. -clamp NGT to ambulate and mobilize.   FEN  -NPO/NGT/IVFs VTE -heparin ID -none needed from our standpoint.  Defer to medicine regarding possible UTI   LOS: 2 days    Henreitta Cea , [facility] 12/03/2018, 10:38 AM Pager: [phone]

## 2018-12-04 ENCOUNTER — Inpatient Hospital Stay (HOSPITAL_COMMUNITY): Payer: Medicare Other

## 2018-12-04 LAB — GLUCOSE, CAPILLARY
Glucose-Capillary: 106 mg/dL — ABNORMAL HIGH (ref 70–99)
Glucose-Capillary: 57 mg/dL — ABNORMAL LOW (ref 70–99)
Glucose-Capillary: 74 mg/dL (ref 70–99)
Glucose-Capillary: 75 mg/dL (ref 70–99)
Glucose-Capillary: 78 mg/dL (ref 70–99)

## 2018-12-04 MED ORDER — GLUCOSE 40 % PO GEL
ORAL | Status: AC
  Administered 2018-12-04: 37.5 g
  Filled 2018-12-04: qty 1

## 2018-12-04 MED ORDER — DEXTROSE-NACL 5-0.45 % IV SOLN
INTRAVENOUS | Status: DC
  Administered 2018-12-04 – 2018-12-06 (×3): via INTRAVENOUS

## 2018-12-04 MED ORDER — HYDRALAZINE HCL 20 MG/ML IJ SOLN
10.0000 mg | Freq: Four times a day (QID) | INTRAMUSCULAR | Status: DC | PRN
  Administered 2018-12-04: 15:00:00 via INTRAVENOUS
  Filled 2018-12-04: qty 1

## 2018-12-04 MED ORDER — DULOXETINE HCL 60 MG PO CPEP
60.0000 mg | ORAL_CAPSULE | Freq: Every day | ORAL | Status: DC
  Administered 2018-12-04 – 2018-12-07 (×4): 60 mg via ORAL
  Filled 2018-12-04 (×4): qty 1

## 2018-12-04 NOTE — Progress Notes (Signed)
Patient ID: GIANELLA CHISMAR, female   DOB: October 28, 1951, 67 y.o.   MRN: [id number]       Subjective: Pt tearful today because she missed her granddaughter in a parade and she hasn't had her cymbalta since earlier this week. Passing some flatus, but no further BMs.  No abdominal pain.    Objective: Vital signs in last 24 hours: Temp:  [97.8 F (36.6 C)-98.7 F (37.1 C)] 97.8 F (36.6 C) (12/13 0447) Pulse Rate:  [58-71] 58 (12/13 0447) Resp:  [16-20] 20 (12/13 0447) BP: (146-156)/(75-84) 154/75 (12/13 0447) SpO2:  [96 %-98 %] 96 % (12/13 0447) Last BM Date: 12/02/18  Intake/Output from previous day: 12/12 0701 - 12/13 0700 In: 1321.7 [P.O.:480; I.V.:841.7] Out: 3300 [Urine:500; Emesis/NG output:2800] Intake/Output this shift: No intake/output data recorded.  PE: Heart: regular Lungs: CTAB Abd: soft, severely obese, NT, NGT with just ice chip/water output.  Lab Results:  Recent Labs    12/02/18 0511 12/03/18 0516  WBC 7.5 6.8  HGB 12.1 12.1  HCT 38.5 38.6  PLT 158 153   BMET Recent Labs    12/02/18 0511 12/03/18 0516  NA 148* 149*  K 3.9 3.8  CL 117* 117*  CO2 23 25  GLUCOSE 92 91  BUN 12 13  CREATININE 0.79 0.75  CALCIUM 8.5* 9.0   PT/INR No results for input(s): LABPROT, INR in the last 72 hours. CMP     Component Value Date/Time   NA 149 (H) 12/03/2018 0516   K 3.8 12/03/2018 0516   CL 117 (H) 12/03/2018 0516   CO2 25 12/03/2018 0516   GLUCOSE 91 12/03/2018 0516   BUN 13 12/03/2018 0516   CREATININE 0.75 12/03/2018 0516   CREATININE 1.08 04/25/2014 1104   CALCIUM 9.0 12/03/2018 0516   PROT 7.2 12/01/2018 0225   ALBUMIN 4.5 12/01/2018 0225   AST 27 12/01/2018 0225   ALT 38 12/01/2018 0225   ALKPHOS 102 12/01/2018 0225   BILITOT 0.6 12/01/2018 0225   GFRNONAA >60 12/03/2018 0516   GFRNONAA 57 (L) 10/25/2013 1042   GFRAA >60 12/03/2018 0516   GFRAA 66 10/25/2013 1042   Lipase     Component Value Date/Time   LIPASE 31 12/01/2018 0225        Studies/Results: Dg Abd 1 View  Result Date: 12/04/2018 CLINICAL DATA:  Small-bowel obstruction EXAM: ABDOMEN - 1 VIEW COMPARISON:  12/02/2018 FINDINGS: NG tube coiled in the body the stomach. Moderately dilated small bowel unchanged. Colonic gas extends into the rectum without dilatation of the colon. No other new findings IMPRESSION: Small-bowel dilatation unchanged most compatible with partial small bowel obstruction. Gas in nondilated colon. Gas in the rectum. Electronically Signed   By: Franchot Gallo M.D.   On: 12/04/2018 06:49   Dg Abd Portable 1v-small Bowel Obstruction Protocol-initial, 8 Hr Delay  Result Date: 12/02/2018 CLINICAL DATA:  8 hour delay, small bowel protocol EXAM: PORTABLE ABDOMEN - 1 VIEW COMPARISON:  12/01/2018, CT 12/01/2018 FINDINGS: Esophageal tube is coiled in the proximal stomach. Persistent dilated loops of small bowel measuring up to 5.1 cm without significant interval change. There is gas present in the colon. Faint contrast is visible within the left colon and rectum. Left hip replacement is noted. IMPRESSION: 1. Esophageal tube tip coiled in the stomach 2. No significant change in dilated small bowel consistent with bowel obstruction. There is gas present within the colon and faint contrast is present in the colon and rectum. Electronically Signed   By: Maudie Mercury  Francoise Ceo M.D.   On: 12/02/2018 20:31    Anti-infectives: Anti-infectives (From admission, onward)   Start     Dose/Rate Route Frequency Ordered Stop   12/03/18 1300  cefTRIAXone (ROCEPHIN) 1 g in sodium chloride 0.9 % 100 mL IVPB     1 g 200 mL/hr over 30 Minutes Intravenous Every 24 hours 12/03/18 1206 12/06/18 1259       Assessment/Plan High grade pSBO -plain films still consistent with high grade psbo; however, her NGT output is not bilious at all. Despite dilated sb loops, will discussed with MD about possible clamping her NGT today and see how she does or just continue on suction another  day. -restart cymbalta and clamp NGT for 1 hour after administration and then resume suction -mobilize and pulm toilet  FEN -NPO/NGT/IVFs VTE -heparin ID -Feftriaxone 12/12 --> UTI   LOS: 3 days    Henreitta Cea , [facility] 12/04/2018, 8:23 AM Pager: [phone]

## 2018-12-04 NOTE — Progress Notes (Addendum)
PROGRESS NOTE    Jordan [name]  QMV:[id number] DOB: 1951-08-29 DOA: 12/01/2018 PCP: [name], NP    Brief Narrative:  Jordan [name] is a 67 y.o. female 67 year old female with a past medical history significant for hypertension, hyperlipidemia, hyperglycemia, gastroesophageal reflux disease, tobacco abuse and morbid obesity; who presented to the emergency department secondary to abdominal pain, nausea and vomiting.  Patient reports symptoms started on 11/29/2018 and despite slight improvement at nighttime and Sunday they started to worsen again since Monday midday.  Pain is localized in the middle section and lower part of her abdomen, not radiated, constant, and also with decreased appetite.    In ED evaluation has demonstrated mild hypokalemia and CT abdomen/abdominal x-ray 8 demonstrating SBO with transition point.  General surgery was consulted, IV fluids initiated an NG tube placed.  Patient also received IV potassium, pain medication and antiemetics.    Assessment & Plan:   Principal Problem:   SBO (small bowel obstruction) (HCC) Active Problems:   Hyperlipidemia   Obesity, Class III, BMI 40-49.9 (morbid obesity) (HCC)   Depression   Essential hypertension   Hyperglycemia   GERD (gastroesophageal reflux disease)   Hypokalemia   Tobacco abuse   1-abdominal pain, nausea and vomiting: In the setting of acute SBO (small bowel obstruction) ([place]). -Appears to be associated with adhesions as transition point was seen on CT abdomen (patient has history of multiple abdominal surgeries in the past). -She experienced an episode of SBO about 2 years ago and expressed that her current symptoms are very similar to prior presentation. -General surgery has been consulted and will follow further recommendation -Conservative management has been initiated with NG tube placement (intermittent suction), IV fluid resuscitation and electrolyte repletion -Continue as needed analgesics and  antiemetics. -Patient is n.p.o. except for ice chips. 12/03/18: -Abdominal x-ray does not show any significant change in the dilated small bowel. -Continue conservative management for now per surgery. 12/04/18: -Patient passing gas.   -X-ray still showing small bowel dilation, air in colon. -Seen by surgery.  Per surgery note NG clamping trial since no bile in the canister and having flatus.  2-hyperlipidemia -No p.o. medications will be given at this moment -Follow lipid panel as an outpatient -Prior to admission was not using any statins.  3-Obesity, Class III, BMI 40-49.9 (morbid obesity) (HCC) -Body mass index is 41.88 kg/m. -Low calorie diet once she can start eating, portion control and increase physical activity, discussed with patient.  4-Depression -Stable mood currently -No suicidal ideation or hallucination -Plan is to resume Cymbalta once able to take by mouth.  5-Essential hypertension -Will hold oral antihypertensive agents while n.p.o. -Start IV metoprolol -Follow vital signs and adjust management as needed. -Pain most likely playing a role in blood pressure elevation. 12/03/18:  -Patient still n.p.o.  Continue to monitor blood pressure and adjust medications. 12/04/18: - BP high.  Started IV hydralazine as needed.  Can resume home medications once she is able to eat.  6-Hyperglycemia -Most recent A1c September 2019 (6.2) -Has been advised to follow modified carbohydrate diet and to lose weight. -Outpatient follow-up with PCP to further monitor her blood sugar levels and initiate hypoglycemic regimen when appropriate.  7-GERD (gastroesophageal reflux disease) -Started on Protonix IV -Resume oral PPI when taken by mouth.  8-Hypokalemia -Due to poor oral intake and ongoing vomiting prior to admission. -Home use of diuretics most likely contributing as well with her low potassium -Will replete potassium IV and will check magnesium and phosphorus  level. 12/03/18: -Potassium repleted. 12/04/18: -Continue to monitor electrolytes and replace as needed.  9-Tobacco abuse - Counseled on cessation. -Patient declined nicotine patch  UTI/cystitis: -Patient complaining of urine odor, change in color. -UA showing positive leukocyte esterase. -We will start her on IV ceftriaxone x3 doses. 12/04/18: -Afebrile, no leukocytosis.  Will complete 3 doses of ceftriaxone.  Hypoglycemia: 12/04/18: -Patient today noted to be hypoglycemic because she is n.p.o. -Started D5 half NS.  Continue to monitor blood glucose.   DVT prophylaxis: Heparin Code Status: Full Family Communication: No family at bedside Disposition Plan: Home once symptoms improve   Consultants:   Surgery  Procedures:   None   Antimicrobials:   None    Subjective: NG tube clamped today.  Denies having any nausea or abdominal pain.  Objective: Vitals:   12/03/18 1806 12/03/18 2125 12/04/18 0447 12/04/18 1312  BP: (!) 146/77 (!) 156/76 (!) 154/75 (!) 195/88  Pulse: 71 67 (!) 58 (!) 57  Resp: 18 20 20 16   Temp: 98 F (36.7 C) 98.7 F (37.1 C) 97.8 F (36.6 C) 97.6 F (36.4 C)  TempSrc: Oral Oral Oral Oral  SpO2: 98% 96% 96% 100%  Weight:      Height:        Intake/Output Summary (Last 24 hours) at 12/04/2018 1631 Last data filed at 12/04/2018 1322 Gross per 24 hour  Intake 600 ml  Output 2700 ml  Net -2100 ml   Filed Weights   12/01/18 0222  Weight: 110.7 kg    Examination:  General exam: Appears calm and comfortable  Respiratory system: Clear to auscultation. Respiratory effort normal. Cardiovascular system: S1 & S2 heard, RRR. No JVD, murmurs, rubs, gallops or clicks. No pedal edema. Gastrointestinal system: Abdomen is nondistended, soft and nontender. No organomegaly or masses felt. Improving bowel sounds. Central nervous system: Alert and oriented. No focal neurological deficits. Extremities: Symmetric 5 x 5 power. Skin: No rashes,  lesions or ulcers Psychiatry: Judgement and insight appear normal. Mood & affect appropriate.     Data Reviewed: I have personally reviewed following labs and imaging studies  CBC: Recent Labs  Lab 12/01/18 0225 12/01/18 0243 12/02/18 0511 12/03/18 0516  WBC 12.1*  --  7.5 6.8  HGB 15.2* 16.3* 12.1 12.1  HCT 46.5* 48.0* 38.5 38.6  MCV 94.5  --  99.7 99.7  PLT 232  --  158 875   Basic Metabolic Panel: Recent Labs  Lab 12/01/18 0225 12/01/18 0243 12/01/18 1540 12/02/18 0511 12/03/18 0516  NA 143 144  --  148* 149*  K 3.1* 3.1*  --  3.9 3.8  CL 108 107  --  117* 117*  CO2 23  --   --  23 25  GLUCOSE 148* 147*  --  92 91  BUN 17 16  --  12 13  CREATININE 0.81 0.90  --  0.79 0.75  CALCIUM 9.6  --   --  8.5* 9.0  MG  --   --  1.7  --   --   PHOS  --   --  3.5  --   --    GFR: Estimated Creatinine Clearance: 81.9 mL/min (by C-G formula based on SCr of 0.75 mg/dL). Liver Function Tests: Recent Labs  Lab 12/01/18 0225  AST 27  ALT 38  ALKPHOS 102  BILITOT 0.6  PROT 7.2  ALBUMIN 4.5   Recent Labs  Lab 12/01/18 0225  LIPASE 31   No results for input(s): AMMONIA in the last  168 hours. Coagulation Profile: No results for input(s): INR, PROTIME in the last 168 hours. Cardiac Enzymes: No results for input(s): CKTOTAL, CKMB, CKMBINDEX, TROPONINI in the last 168 hours. BNP (last 3 results) No results for input(s): PROBNP in the last 8760 hours. HbA1C: No results for input(s): HGBA1C in the last 72 hours. CBG: Recent Labs  Lab 12/03/18 1739 12/04/18 0034 12/04/18 1209 12/04/18 1235 12/04/18 1309  GLUCAP 70 75 57* 74 78   Lipid Profile: No results for input(s): CHOL, HDL, LDLCALC, TRIG, CHOLHDL, LDLDIRECT in the last 72 hours. Thyroid Function Tests: No results for input(s): TSH, T4TOTAL, FREET4, T3FREE, THYROIDAB in the last 72 hours. Anemia Panel: No results for input(s): VITAMINB12, FOLATE, FERRITIN, TIBC, IRON, RETICCTPCT in the last 72 hours. Sepsis  Labs: Recent Labs  Lab 12/01/18 0241  LATICACIDVEN 1.41    No results found for this or any previous visit (from the past 240 hour(s)).       Radiology Studies: Dg Abd 1 View  Result Date: 12/04/2018 CLINICAL DATA:  Small-bowel obstruction EXAM: ABDOMEN - 1 VIEW COMPARISON:  12/02/2018 FINDINGS: NG tube coiled in the body the stomach. Moderately dilated small bowel unchanged. Colonic gas extends into the rectum without dilatation of the colon. No other new findings IMPRESSION: Small-bowel dilatation unchanged most compatible with partial small bowel obstruction. Gas in nondilated colon. Gas in the rectum. Electronically Signed   By: Franchot Gallo M.D.   On: 12/04/2018 06:49   Dg Abd Portable 1v-small Bowel Obstruction Protocol-initial, 8 Hr Delay  Result Date: 12/02/2018 CLINICAL DATA:  8 hour delay, small bowel protocol EXAM: PORTABLE ABDOMEN - 1 VIEW COMPARISON:  12/01/2018, CT 12/01/2018 FINDINGS: Esophageal tube is coiled in the proximal stomach. Persistent dilated loops of small bowel measuring up to 5.1 cm without significant interval change. There is gas present in the colon. Faint contrast is visible within the left colon and rectum. Left hip replacement is noted. IMPRESSION: 1. Esophageal tube tip coiled in the stomach 2. No significant change in dilated small bowel consistent with bowel obstruction. There is gas present within the colon and faint contrast is present in the colon and rectum. Electronically Signed   By: Donavan Foil M.D.   On: 12/02/2018 20:31        Scheduled Meds: . DULoxetine  60 mg Oral Daily  . heparin  5,000 Units Subcutaneous Q8H  . metoprolol tartrate  5 mg Intravenous Q8H  . pantoprazole (PROTONIX) IV  40 mg Intravenous Q24H   Continuous Infusions: . 0.9 % NaCl with KCl 40 mEq / L 100 mL/hr (12/03/18 1745)  . cefTRIAXone (ROCEPHIN)  IV 1 g (12/04/18 1521)  . dextrose 5 % and 0.45% NaCl 75 mL/hr at 12/04/18 1509     LOS: 3 days    Time  spent: 73min    Yaakov Guthrie MD [facility] Please check amion for pager  If 7PM-7AM, please contact night-coverage [email or web address] Password [email or web address] 12/04/2018, 4:31 PM

## 2018-12-04 NOTE — Care Management Important Message (Signed)
Important Message  Patient Details  Name: Jordan [name] MRN: [id number] Date of Birth: 1951/07/09   Medicare Important Message Given:  Yes    Kerin Salen 12/04/2018, 10:22 AMImportant Message  Patient Details  Name: Jordan [name] MRN: [id number] Date of Birth: Jan 10, 1951   Medicare Important Message Given:  Yes    Kerin Salen 12/04/2018, 10:22 AM

## 2018-12-05 ENCOUNTER — Encounter: Payer: Self-pay | Admitting: Family

## 2018-12-05 ENCOUNTER — Inpatient Hospital Stay (HOSPITAL_COMMUNITY): Payer: Medicare Other

## 2018-12-05 DIAGNOSIS — F329 Major depressive disorder, single episode, unspecified: Secondary | ICD-10-CM

## 2018-12-05 DIAGNOSIS — K56609 Unspecified intestinal obstruction, unspecified as to partial versus complete obstruction: Secondary | ICD-10-CM

## 2018-12-05 DIAGNOSIS — Z720 Tobacco use: Secondary | ICD-10-CM

## 2018-12-05 DIAGNOSIS — E876 Hypokalemia: Secondary | ICD-10-CM

## 2018-12-05 DIAGNOSIS — K219 Gastro-esophageal reflux disease without esophagitis: Secondary | ICD-10-CM

## 2018-12-05 DIAGNOSIS — I10 Essential (primary) hypertension: Secondary | ICD-10-CM

## 2018-12-05 LAB — CBC
HCT: 39.8 % (ref 36.0–46.0)
Hemoglobin: 12.7 g/dL (ref 12.0–15.0)
MCH: 30.2 pg (ref 26.0–34.0)
MCHC: 31.9 g/dL (ref 30.0–36.0)
MCV: 94.8 fL (ref 80.0–100.0)
Platelets: 161 10*3/uL (ref 150–400)
RBC: 4.2 MIL/uL (ref 3.87–5.11)
RDW: 13.3 % (ref 11.5–15.5)
WBC: 5.4 10*3/uL (ref 4.0–10.5)
nRBC: 0 % (ref 0.0–0.2)

## 2018-12-05 LAB — BASIC METABOLIC PANEL
Anion gap: 10 (ref 5–15)
BUN: 15 mg/dL (ref 8–23)
CALCIUM: 8.9 mg/dL (ref 8.9–10.3)
CO2: 24 mmol/L (ref 22–32)
Chloride: 108 mmol/L (ref 98–111)
Creatinine, Ser: 0.74 mg/dL (ref 0.44–1.00)
GFR calc Af Amer: >60 mL/min (ref >60)
GFR calc non Af Amer: >60 mL/min (ref >60)
Glucose, Bld: 102 mg/dL — ABNORMAL HIGH (ref 70–99)
Potassium: 3.6 mmol/L (ref 3.5–5.1)
Sodium: 142 mmol/L (ref 135–145)

## 2018-12-05 LAB — GLUCOSE, CAPILLARY
Glucose-Capillary: 107 mg/dL — ABNORMAL HIGH (ref 70–99)
Glucose-Capillary: 86 mg/dL (ref 70–99)
Glucose-Capillary: 88 mg/dL (ref 70–99)
Glucose-Capillary: 94 mg/dL (ref 70–99)

## 2018-12-05 NOTE — Progress Notes (Signed)
Assessment & Plan: High grade pSBO -AXR improved this AM, patient having flatus and BM's -discontinue NG tube -trial of clear liquid diet -restart cymbalta -mobilize and pulm toilet        [name], MD       [facility]       Office: [phone]   Chief Complaint: Small bowel obstruction  Subjective: Patient in bed, comfortable.  No nausea or emesis with NG clamped overnight.  Objective: Vital signs in last 24 hours: Temp:  [97.6 F (36.4 C)-97.9 F (36.6 C)] 97.9 F (36.6 C) (12/14 0528) Pulse Rate:  [56-59] 59 (12/14 0528) Resp:  [14-16] 16 (12/14 0528) BP: (138-195)/(82-88) 138/84 (12/14 0528) SpO2:  [98 %-100 %] 99 % (12/14 0528) Last BM Date: 12/02/18  Intake/Output from previous day: 12/13 0701 - 12/14 0700 In: 1120 [P.O.:120; I.V.:900; IV Piggyback:100] Out: 500 [Emesis/NG output:500] Intake/Output this shift: No intake/output data recorded.  Physical Exam: HEENT - sclerae clear, mucous membranes moist Neck - soft Chest - clear bilaterally Cor - RRR Abdomen - soft without distension; non-tender Ext - no edema, non-tender Neuro - alert & oriented, no focal deficits  Lab Results:  Recent Labs    12/03/18 0516 12/05/18 0523  WBC 6.8 5.4  HGB 12.1 12.7  HCT 38.6 39.8  PLT 153 161   BMET Recent Labs    12/03/18 0516 12/05/18 0523  NA 149* 142  K 3.8 3.6  CL 117* 108  CO2 25 24  GLUCOSE 91 102*  BUN 13 15  CREATININE 0.75 0.74  CALCIUM 9.0 8.9   PT/INR No results for input(s): LABPROT, INR in the last 72 hours. Comprehensive Metabolic Panel:    Component Value Date/Time   NA 142 12/05/2018 0523   NA 149 (H) 12/03/2018 0516   K 3.6 12/05/2018 0523   K 3.8 12/03/2018 0516   CL 108 12/05/2018 0523   CL 117 (H) 12/03/2018 0516   CO2 24 12/05/2018 0523   CO2 25 12/03/2018 0516   BUN 15 12/05/2018 0523   BUN 13 12/03/2018 0516   CREATININE 0.74 12/05/2018 0523   CREATININE 0.75 12/03/2018 0516   CREATININE 1.08 04/25/2014 1104   CREATININE 1.04 10/25/2013 1042   GLUCOSE 102 (H) 12/05/2018 0523   GLUCOSE 91 12/03/2018 0516   CALCIUM 8.9 12/05/2018 0523   CALCIUM 9.0 12/03/2018 0516   AST 27 12/01/2018 0225   AST 27 07/27/2016 2140   ALT 38 12/01/2018 0225   ALT 36 07/27/2016 2140   ALKPHOS 102 12/01/2018 0225   ALKPHOS 113 07/27/2016 2140   BILITOT 0.6 12/01/2018 0225   BILITOT 0.9 07/27/2016 2140   PROT 7.2 12/01/2018 0225   PROT 8.0 07/27/2016 2140   ALBUMIN 4.5 12/01/2018 0225   ALBUMIN 4.8 07/27/2016 2140    Studies/Results: Dg Abd 1 View  Result Date: 12/05/2018 CLINICAL DATA:  Partial small bowel obstruction. EXAM: ABDOMEN - 1 VIEW COMPARISON:  Radiographs of December 04, 2018. FINDINGS: Improved small bowel dilatation is noted suggesting improving partial small bowel obstruction or ileus. Nasogastric tube is seen looped in proximal stomach. Phleboliths are noted in the pelvis. IMPRESSION: Improved small bowel dilatation is noted suggesting improving small bowel obstruction or ileus. Electronically Signed   By: Marijo Conception, [name].D.   On: 12/05/2018 07:55   Dg Abd 1 View  Result Date: 12/04/2018 CLINICAL DATA:  Small-bowel obstruction EXAM: ABDOMEN - 1 VIEW COMPARISON:  12/02/2018 FINDINGS: NG tube coiled in the body  the stomach. Moderately dilated small bowel unchanged. Colonic gas extends into the rectum without dilatation of the colon. No other new findings IMPRESSION: Small-bowel dilatation unchanged most compatible with partial small bowel obstruction. Gas in nondilated colon. Gas in the rectum. Electronically Signed   By: Franchot Gallo [name].D.   On: 12/04/2018 06:49      Jordan [name] 12/05/2018  Patient ID: Jordan [name], female   DOB: 16-Feb-1951, 67 y.o.   MRN: [id number]

## 2018-12-05 NOTE — Progress Notes (Signed)
PROGRESS NOTE    Jordan [name]  BJY:[id number] DOB: May 03, 1951 DOA: 12/01/2018 PCP: [name], NP    Brief Narrative:  Jordan [name] is a 67 y.o. female 67 year old female with a past medical history significant for hypertension, hyperlipidemia, hyperglycemia, gastroesophageal reflux disease, tobacco abuse and morbid obesity; who presented to the emergency department secondary to abdominal pain, nausea and vomiting.  Patient reports symptoms started on 11/29/2018 and despite slight improvement at nighttime and Sunday they started to worsen again since Monday midday.  Pain is localized in the middle section and lower part of her abdomen, not radiated, constant, and also with decreased appetite. In ED evaluation has demonstrated mild hypokalemia and CT abdomen/abdominal x-ray demonstrating SBO with transition point.  General surgery was consulted, IV fluids initiated an NG tube placed.  Patient also received IV potassium, pain medication and antiemetics.    Assessment & Plan:   Principal Problem:   SBO (small bowel obstruction) (HCC) Active Problems:   Hyperlipidemia   Obesity, Class III, BMI 40-49.9 (morbid obesity) (HCC)   Depression   Essential hypertension   Hyperglycemia   GERD (gastroesophageal reflux disease)   Hypokalemia   Tobacco abuse  SBO (small bowel obstruction) ([place]). Improving, passing gas -Appears to be associated with adhesions as transition point was seen on CT abdomen (patient has history of multiple abdominal surgeries in the past) -Repeat Xray with improving SBO -General surgery on board: S/p NG tube placement (intermittent suction), removed on 12/05/18, IV fluid resuscitation and electrolyte repletion. Diet advanced -Continue as needed analgesics and antiemetics.  Morbid Obesity, Class III, BMI 40-49.9 (morbid obesity) (HCC) -Body mass index is 41.88 kg/m. -Lifestyle modification  Depression Stable mood Continue Cymbalta  Essential  hypertension IV metoprolol, hydralazine prn  GERD (gastroesophageal reflux disease) Continue Protonix  Hypokalemia Resolved Replace prn  Tobacco abuse Counseled on cessation. Patient declined nicotine patch  Hypoglycemia Continue IVF for now, plan to stop in am  UTI/cystitis Patient complaining of urine odor, change in color. UA showing positive leukocyte esterase. Completed 3 doses of ceftriaxone.    DVT prophylaxis: Heparin Code Status: Full Family Communication: No family at bedside Disposition Plan: Home likely 12/06/18   Consultants:   Surgery  Procedures:   None   Antimicrobials:  NOne    Subjective: Pt reported feeling much better, excited for NGT to be out and eager to eat. Denies any new complaints.  Objective: Vitals:   12/04/18 1741 12/04/18 2015 12/05/18 0528 12/05/18 1339  BP: (!) 161/83 140/82 138/84 (!) 149/97  Pulse: (!) 57 (!) 56 (!) 59 61  Resp:  14 16 16   Temp:  97.7 F (36.5 C) 97.9 F (36.6 C) 98.1 F (36.7 C)  TempSrc:  Oral Oral Oral  SpO2:  98% 99% 98%  Weight:      Height:        Intake/Output Summary (Last 24 hours) at 12/05/2018 1943 Last data filed at 12/05/2018 1847 Gross per 24 hour  Intake 1480 ml  Output -  Net 1480 ml   Filed Weights   12/01/18 0222  Weight: 110.7 kg    Examination:  General: NAD   Cardiovascular: S1, S2 present  Respiratory: CTAB  Abdomen: Soft, nontender, nondistended, bowel sounds present  Musculoskeletal: No bilateral pedal edema noted  Skin: Normal  Psychiatry: Normal mood    Data Reviewed: I have personally reviewed following labs and imaging studies  CBC: Recent Labs  Lab 12/01/18 0225 12/01/18 0243 12/02/18 0865 12/03/18 0516 12/05/18 7846  WBC 12.1*  --  7.5 6.8 5.4  HGB 15.2* 16.3* 12.1 12.1 12.7  HCT 46.5* 48.0* 38.5 38.6 39.8  MCV 94.5  --  99.7 99.7 94.8  PLT 232  --  158 153 431   Basic Metabolic Panel: Recent Labs  Lab 12/01/18 0225  12/01/18 0243 12/01/18 1540 12/02/18 0511 12/03/18 0516 12/05/18 0523  NA 143 144  --  148* 149* 142  K 3.1* 3.1*  --  3.9 3.8 3.6  CL 108 107  --  117* 117* 108  CO2 23  --   --  23 25 24   GLUCOSE 148* 147*  --  92 91 102*  BUN 17 16  --  12 13 15   CREATININE 0.81 0.90  --  0.79 0.75 0.74  CALCIUM 9.6  --   --  8.5* 9.0 8.9  MG  --   --  1.7  --   --   --   PHOS  --   --  3.5  --   --   --    GFR: Estimated Creatinine Clearance: 81.9 mL/min (by C-G formula based on SCr of 0.74 mg/dL). Liver Function Tests: Recent Labs  Lab 12/01/18 0225  AST 27  ALT 38  ALKPHOS 102  BILITOT 0.6  PROT 7.2  ALBUMIN 4.5   Recent Labs  Lab 12/01/18 0225  LIPASE 31   No results for input(s): AMMONIA in the last 168 hours. Coagulation Profile: No results for input(s): INR, PROTIME in the last 168 hours. Cardiac Enzymes: No results for input(s): CKTOTAL, CKMB, CKMBINDEX, TROPONINI in the last 168 hours. BNP (last 3 results) No results for input(s): PROBNP in the last 8760 hours. HbA1C: No results for input(s): HGBA1C in the last 72 hours. CBG: Recent Labs  Lab 12/04/18 1738 12/05/18 0014 12/05/18 0538 12/05/18 1152 12/05/18 1658  GLUCAP 106* 88 94 86 107*   Lipid Profile: No results for input(s): CHOL, HDL, LDLCALC, TRIG, CHOLHDL, LDLDIRECT in the last 72 hours. Thyroid Function Tests: No results for input(s): TSH, T4TOTAL, FREET4, T3FREE, THYROIDAB in the last 72 hours. Anemia Panel: No results for input(s): VITAMINB12, FOLATE, FERRITIN, TIBC, IRON, RETICCTPCT in the last 72 hours. Sepsis Labs: Recent Labs  Lab 12/01/18 0241  LATICACIDVEN 1.41    No results found for this or any previous visit (from the past 240 hour(s)).       Radiology Studies: Dg Abd 1 View  Result Date: 12/05/2018 CLINICAL DATA:  Partial small bowel obstruction. EXAM: ABDOMEN - 1 VIEW COMPARISON:  Radiographs of December 04, 2018. FINDINGS: Improved small bowel dilatation is noted suggesting  improving partial small bowel obstruction or ileus. Nasogastric tube is seen looped in proximal stomach. Phleboliths are noted in the pelvis. IMPRESSION: Improved small bowel dilatation is noted suggesting improving small bowel obstruction or ileus. Electronically Signed   By: Marijo Conception, M.D.   On: 12/05/2018 07:55   Dg Abd 1 View  Result Date: 12/04/2018 CLINICAL DATA:  Small-bowel obstruction EXAM: ABDOMEN - 1 VIEW COMPARISON:  12/02/2018 FINDINGS: NG tube coiled in the body the stomach. Moderately dilated small bowel unchanged. Colonic gas extends into the rectum without dilatation of the colon. No other new findings IMPRESSION: Small-bowel dilatation unchanged most compatible with partial small bowel obstruction. Gas in nondilated colon. Gas in the rectum. Electronically Signed   By: Franchot Gallo M.D.   On: 12/04/2018 06:49        Scheduled Meds: . DULoxetine  60 mg Oral Daily  .  heparin  5,000 Units Subcutaneous Q8H  . metoprolol tartrate  5 mg Intravenous Q8H  . pantoprazole (PROTONIX) IV  40 mg Intravenous Q24H   Continuous Infusions: . 0.9 % NaCl with KCl 40 mEq / L 100 mL/hr (12/03/18 1745)  . dextrose 5 % and 0.45% NaCl 75 mL/hr at 12/05/18 0556     LOS: 4 days        Adline Peals ,MD [facility] Please check amion for pager  If 7PM-7AM, please contact night-coverage [email or web address] Password [facility] 12/05/2018, 7:43 PM

## 2018-12-06 LAB — BASIC METABOLIC PANEL
Anion gap: 9 (ref 5–15)
BUN: 10 mg/dL (ref 8–23)
CHLORIDE: 109 mmol/L (ref 98–111)
CO2: 23 mmol/L (ref 22–32)
Calcium: 8.8 mg/dL — ABNORMAL LOW (ref 8.9–10.3)
Creatinine, Ser: 0.68 mg/dL (ref 0.44–1.00)
GFR calc Af Amer: >60 mL/min (ref >60)
GFR calc non Af Amer: >60 mL/min (ref >60)
Glucose, Bld: 105 mg/dL — ABNORMAL HIGH (ref 70–99)
Potassium: 3.5 mmol/L (ref 3.5–5.1)
Sodium: 141 mmol/L (ref 135–145)

## 2018-12-06 LAB — CBC WITH DIFFERENTIAL/PLATELET
Abs Immature Granulocytes: 0.04 10*3/uL (ref 0.00–0.07)
Basophils Absolute: 0 10*3/uL (ref 0.0–0.1)
Basophils Relative: 0 %
EOS PCT: 2 %
Eosinophils Absolute: 0.1 10*3/uL (ref 0.0–0.5)
HCT: 39.5 % (ref 36.0–46.0)
Hemoglobin: 12.8 g/dL (ref 12.0–15.0)
Immature Granulocytes: 1 %
Lymphocytes Relative: 45 %
Lymphs Abs: 2.4 10*3/uL (ref 0.7–4.0)
MCH: 30.8 pg (ref 26.0–34.0)
MCHC: 32.4 g/dL (ref 30.0–36.0)
MCV: 95.2 fL (ref 80.0–100.0)
Monocytes Absolute: 0.3 10*3/uL (ref 0.1–1.0)
Monocytes Relative: 5 %
Neutro Abs: 2.4 10*3/uL (ref 1.7–7.7)
Neutrophils Relative %: 47 %
Platelets: 175 10*3/uL (ref 150–400)
RBC: 4.15 MIL/uL (ref 3.87–5.11)
RDW: 13.4 % (ref 11.5–15.5)
WBC: 5.3 10*3/uL (ref 4.0–10.5)
nRBC: 0 % (ref 0.0–0.2)

## 2018-12-06 MED ORDER — PANTOPRAZOLE SODIUM 40 MG PO TBEC
40.0000 mg | DELAYED_RELEASE_TABLET | Freq: Every day | ORAL | Status: DC
  Administered 2018-12-07: 40 mg via ORAL
  Filled 2018-12-06: qty 1

## 2018-12-06 MED ORDER — TRIAMTERENE-HCTZ 37.5-25 MG PO CAPS: 1.0000 | ORAL_CAPSULE | Freq: Every morning | ORAL | Status: DC

## 2018-12-06 MED ORDER — TRIAMTERENE-HCTZ 37.5-25 MG PO CAPS
1.0000 | ORAL_CAPSULE | Freq: Every morning | ORAL | Status: DC
  Administered 2018-12-06 – 2018-12-07 (×2): 1 via ORAL
  Filled 2018-12-06 (×3): qty 1

## 2018-12-06 NOTE — Progress Notes (Signed)
Assessment & Plan: Small bowel obstruction  Tolerating clear liquid diet - advance to full liquid diet this AM  Encouraged OOB, ambulation  If continued clinical improvement, likely home in AM 12/16        [name], MD       [facility]       Office: [phone]   Chief Complaint: Small bowel obstruction  Subjective: Patient in bed, comfortable.  No complaints overnight - denies nausea or emesis; passing flatus, liquid BM's.  Objective: Vital signs in last 24 hours: Temp:  [97.8 F (36.6 C)-99 F (37.2 C)] 97.8 F (36.6 C) (12/15 0410) Pulse Rate:  [56-61] 56 (12/15 0410) Resp:  [12-16] 14 (12/15 0410) BP: (126-163)/(76-97) 126/76 (12/15 0710) SpO2:  [98 %-100 %] 100 % (12/15 0410) Last BM Date: 12/05/18  Intake/Output from previous day: 12/14 0701 - 12/15 0700 In: 2382.2 [P.O.:480; I.V.:1902.2] Out: -  Intake/Output this shift: No intake/output data recorded.  Physical Exam: HEENT - sclerae clear, mucous membranes moist Neck - soft Chest - clear bilaterally Cor - RRR Abdomen - soft without distension; non-tender Ext - no edema, non-tender Neuro - alert & oriented, no focal deficits  Lab Results:  Recent Labs    12/05/18 0523 12/06/18 0545  WBC 5.4 5.3  HGB 12.7 12.8  HCT 39.8 39.5  PLT 161 175   BMET Recent Labs    12/05/18 0523 12/06/18 0545  NA 142 141  K 3.6 3.5  CL 108 109  CO2 24 23  GLUCOSE 102* 105*  BUN 15 10  CREATININE 0.74 0.68  CALCIUM 8.9 8.8*   PT/INR No results for input(s): LABPROT, INR in the last 72 hours. Comprehensive Metabolic Panel:    Component Value Date/Time   NA 141 12/06/2018 0545   NA 142 12/05/2018 0523   K 3.5 12/06/2018 0545   K 3.6 12/05/2018 0523   CL 109 12/06/2018 0545   CL 108 12/05/2018 0523   CO2 23 12/06/2018 0545   CO2 24 12/05/2018 0523   BUN 10 12/06/2018 0545   BUN 15 12/05/2018 0523   CREATININE 0.68 12/06/2018 0545   CREATININE 0.74 12/05/2018 0523   CREATININE 1.08 04/25/2014 1104   CREATININE 1.04 10/25/2013 1042   GLUCOSE 105 (H) 12/06/2018 0545   GLUCOSE 102 (H) 12/05/2018 0523   CALCIUM 8.8 (L) 12/06/2018 0545   CALCIUM 8.9 12/05/2018 0523   AST 27 12/01/2018 0225   AST 27 07/27/2016 2140   ALT 38 12/01/2018 0225   ALT 36 07/27/2016 2140   ALKPHOS 102 12/01/2018 0225   ALKPHOS 113 07/27/2016 2140   BILITOT 0.6 12/01/2018 0225   BILITOT 0.9 07/27/2016 2140   PROT 7.2 12/01/2018 0225   PROT 8.0 07/27/2016 2140   ALBUMIN 4.5 12/01/2018 0225   ALBUMIN 4.8 07/27/2016 2140    Studies/Results: Dg Abd 1 View  Result Date: 12/05/2018 CLINICAL DATA:  Partial small bowel obstruction. EXAM: ABDOMEN - 1 VIEW COMPARISON:  Radiographs of December 04, 2018. FINDINGS: Improved small bowel dilatation is noted suggesting improving partial small bowel obstruction or ileus. Nasogastric tube is seen looped in proximal stomach. Phleboliths are noted in the pelvis. IMPRESSION: Improved small bowel dilatation is noted suggesting improving small bowel obstruction or ileus. Electronically Signed   By: Marijo Conception, M.D.   On: 12/05/2018 07:55      Tiron Suski M 12/06/2018  Patient ID: Jordan [name], female   DOB: [birth year], 67 y.o.   MRN: [id number]

## 2018-12-06 NOTE — Progress Notes (Signed)
PROGRESS NOTE    Jordan [name]  HQI:[id number] DOB: 09/04/1951 DOA: 12/01/2018 PCP: [name], NP    Brief Narrative:  Jordan [name] is a 67 y.o. female 67 year old female with a past medical history significant for hypertension, hyperlipidemia, hyperglycemia, gastroesophageal reflux disease, tobacco abuse and morbid obesity; who presented to the emergency department secondary to abdominal pain, nausea and vomiting.  Patient reports symptoms started on 11/29/2018 and despite slight improvement at nighttime and Sunday they started to worsen again since Monday midday.  Pain is localized in the middle section and lower part of her abdomen, not radiated, constant, and also with decreased appetite. In ED evaluation has demonstrated mild hypokalemia and CT abdomen/abdominal x-ray demonstrating SBO with transition point.  General surgery was consulted, IV fluids initiated an NG tube placed.  Patient also received IV potassium, pain medication and antiemetics.    Assessment & Plan:   Principal Problem:   SBO (small bowel obstruction) (HCC) Active Problems:   Hyperlipidemia   Obesity, Class III, BMI 40-49.9 (morbid obesity) (HCC)   Depression   Essential hypertension   Hyperglycemia   GERD (gastroesophageal reflux disease)   Hypokalemia   Tobacco abuse  SBO (small bowel obstruction) ([place]). Improving, passing gas, liquid BM, tolerated CLD -Appears to be associated with adhesions as transition point was seen on CT abdomen (patient has history of multiple abdominal surgeries in the past) -Repeat Xray with improving SBO -General surgery on board: S/p NG tube placement (intermittent suction), removed on 12/05/18. Diet advanced FLD -Continue as needed analgesics and antiemetics.  Morbid Obesity, Class III, BMI 40-49.9 (morbid obesity) (HCC) -Body mass index is 41.88 kg/m. -Lifestyle modification  Depression Stable mood Continue Cymbalta  Essential hypertension Start home  Dyazide, hydralazine prn  GERD (gastroesophageal reflux disease) Continue Protonix  Hypokalemia Resolved Replace prn  Tobacco abuse Counseled on cessation. Patient declined nicotine patch  Hypoglycemia Resolved  UTI/cystitis Patient complaining of urine odor, change in color. UA showing positive leukocyte esterase. Completed 3 doses of ceftriaxone.    DVT prophylaxis: Heparin Code Status: Full Family Communication: No family at bedside Disposition Plan: Home likely 12/07/18   Consultants:   Surgery  Procedures:   None   Antimicrobials:  NOne    Subjective: Pt denies any new complaints, tolerated CLD, denies any N/V, abdominal pain. Having liquid BM  Objective: Vitals:   12/05/18 2103 12/06/18 0410 12/06/18 0710 12/06/18 1349  BP: (!) 153/77 (!) 163/84 126/76 (!) 154/84  Pulse: (!) 58 (!) 56  (!) 58  Resp: 12 14  16   Temp: 99 F (37.2 C) 97.8 F (36.6 C)  98 F (36.7 C)  TempSrc: Oral Oral  Oral  SpO2: 99% 100%  97%  Weight:      Height:        Intake/Output Summary (Last 24 hours) at 12/06/2018 1601 Last data filed at 12/06/2018 0402 Gross per 24 hour  Intake 2142.17 ml  Output -  Net 2142.17 ml   Filed Weights   12/01/18 0222  Weight: 110.7 kg    Examination:  General: NAD   Cardiovascular: S1, S2 present  Respiratory: CTAB  Abdomen: Soft, nontender, nondistended, bowel sounds present  Musculoskeletal: No bilateral pedal edema noted  Skin: Normal  Psychiatry: Normal mood    Data Reviewed: I have personally reviewed following labs and imaging studies  CBC: Recent Labs  Lab 12/01/18 0225 12/01/18 0243 12/02/18 0511 12/03/18 0516 12/05/18 0523 12/06/18 0545  WBC 12.1*  --  7.5 6.8 5.4 5.3  NEUTROABS  --   --   --   --   --  2.4  HGB 15.2* 16.3* 12.1 12.1 12.7 12.8  HCT 46.5* 48.0* 38.5 38.6 39.8 39.5  MCV 94.5  --  99.7 99.7 94.8 95.2  PLT 232  --  158 153 161 161   Basic Metabolic Panel: Recent Labs  Lab  12/01/18 0225 12/01/18 0243 12/01/18 1540 12/02/18 0511 12/03/18 0516 12/05/18 0523 12/06/18 0545  NA 143 144  --  148* 149* 142 141  K 3.1* 3.1*  --  3.9 3.8 3.6 3.5  CL 108 107  --  117* 117* 108 109  CO2 23  --   --  23 25 24 23   GLUCOSE 148* 147*  --  92 91 102* 105*  BUN 17 16  --  12 13 15 10   CREATININE 0.81 0.90  --  0.79 0.75 0.74 0.68  CALCIUM 9.6  --   --  8.5* 9.0 8.9 8.8*  MG  --   --  1.7  --   --   --   --   PHOS  --   --  3.5  --   --   --   --    GFR: Estimated Creatinine Clearance: 81.9 mL/min (by C-G formula based on SCr of 0.68 mg/dL). Liver Function Tests: Recent Labs  Lab 12/01/18 0225  AST 27  ALT 38  ALKPHOS 102  BILITOT 0.6  PROT 7.2  ALBUMIN 4.5   Recent Labs  Lab 12/01/18 0225  LIPASE 31   No results for input(s): AMMONIA in the last 168 hours. Coagulation Profile: No results for input(s): INR, PROTIME in the last 168 hours. Cardiac Enzymes: No results for input(s): CKTOTAL, CKMB, CKMBINDEX, TROPONINI in the last 168 hours. BNP (last 3 results) No results for input(s): PROBNP in the last 8760 hours. HbA1C: No results for input(s): HGBA1C in the last 72 hours. CBG: Recent Labs  Lab 12/04/18 1738 12/05/18 0014 12/05/18 0538 12/05/18 1152 12/05/18 1658  GLUCAP 106* 88 94 86 107*   Lipid Profile: No results for input(s): CHOL, HDL, LDLCALC, TRIG, CHOLHDL, LDLDIRECT in the last 72 hours. Thyroid Function Tests: No results for input(s): TSH, T4TOTAL, FREET4, T3FREE, THYROIDAB in the last 72 hours. Anemia Panel: No results for input(s): VITAMINB12, FOLATE, FERRITIN, TIBC, IRON, RETICCTPCT in the last 72 hours. Sepsis Labs: Recent Labs  Lab 12/01/18 0241  LATICACIDVEN 1.41    No results found for this or any previous visit (from the past 240 hour(s)).       Radiology Studies: Dg Abd 1 View  Result Date: 12/05/2018 CLINICAL DATA:  Partial small bowel obstruction. EXAM: ABDOMEN - 1 VIEW COMPARISON:  Radiographs of  December 04, 2018. FINDINGS: Improved small bowel dilatation is noted suggesting improving partial small bowel obstruction or ileus. Nasogastric tube is seen looped in proximal stomach. Phleboliths are noted in the pelvis. IMPRESSION: Improved small bowel dilatation is noted suggesting improving small bowel obstruction or ileus. Electronically Signed   By: Marijo Conception, M.D.   On: 12/05/2018 07:55        Scheduled Meds: . DULoxetine  60 mg Oral Daily  . heparin  5,000 Units Subcutaneous Q8H  . metoprolol tartrate  5 mg Intravenous Q8H  . [START ON 12/07/2018] pantoprazole  40 mg Oral Daily   Continuous Infusions:    LOS: 5 days        Adline Peals Trinady Milewski,MD [facility] Please check amion for pager  If 7PM-7AM, please contact night-coverage [email or web address] Password [email or web address] 12/06/2018, 4:01 PM

## 2018-12-06 NOTE — Progress Notes (Signed)
Pharmacy IV to PO conversion  The patient is receiving pantoprazole by the intravenous route.  Based on criteria approved by the Pharmacy and [place], the medication is being converted to the equivalent oral dose form.   No active GI bleeding or impaired absorption  Not s/p esophagectomy  Documented ability to take oral medications for > 24 hr  Plan to continue treatment for at least 1 day  If you have any questions about this conversion, please contact the Pharmacy Department (ext [phone]).  Thank you.  Reuel Boom, PharmD, BCPS [phone] 12/06/2018, 12:19 PM

## 2018-12-06 NOTE — Progress Notes (Addendum)
Pt ambulating in the hall, tolerating well. Tolerating full liquid diet as well.

## 2018-12-07 NOTE — Progress Notes (Signed)
[facility] Progress Note     Subjective: CC:  No new complaints. Eager for discharge. Tolerating PO. Having flatus and loose BMs.  Objective: Vital signs in last 24 hours: Temp:  [97.9 F (36.6 C)-98 F (36.7 C)] 98 F (36.7 C) (12/16 0410) Pulse Rate:  [58-62] 60 (12/16 0410) Resp:  [14-18] 18 (12/16 0410) BP: (137-173)/(84-101) 140/90 (12/16 0410) SpO2:  [97 %-98 %] 97 % (12/16 0410) Last BM Date: 12/06/18  Intake/Output from previous day: 12/15 0701 - 12/16 0700 In: 480 [P.O.:480] Out: -  Intake/Output this shift: No intake/output data recorded.  PE: Gen:  Alert, NAD, pleasant Card:  Regular rate and rhythm, pedal pulses 2+ BL Pulm:  Normal effort, clear to auscultation bilaterally Abd: Soft, obese, non-tender, mild distention, bowel sounds present  Skin: warm and dry, no rashes  Psych: A&Ox3   Lab Results:  Recent Labs    12/05/18 0523 12/06/18 0545  WBC 5.4 5.3  HGB 12.7 12.8  HCT 39.8 39.5  PLT 161 175   BMET Recent Labs    12/05/18 0523 12/06/18 0545  NA 142 141  K 3.6 3.5  CL 108 109  CO2 24 23  GLUCOSE 102* 105*  BUN 15 10  CREATININE 0.74 0.68  CALCIUM 8.9 8.8*   PT/INR No results for input(s): LABPROT, INR in the last 72 hours. CMP     Component Value Date/Time   NA 141 12/06/2018 0545   K 3.5 12/06/2018 0545   CL 109 12/06/2018 0545   CO2 23 12/06/2018 0545   GLUCOSE 105 (H) 12/06/2018 0545   BUN 10 12/06/2018 0545   CREATININE 0.68 12/06/2018 0545   CREATININE 1.08 04/25/2014 1104   CALCIUM 8.8 (L) 12/06/2018 0545   PROT 7.2 12/01/2018 0225   ALBUMIN 4.5 12/01/2018 0225   AST 27 12/01/2018 0225   ALT 38 12/01/2018 0225   ALKPHOS 102 12/01/2018 0225   BILITOT 0.6 12/01/2018 0225   GFRNONAA >60 12/06/2018 0545   GFRNONAA 57 (L) 10/25/2013 1042   GFRAA >60 12/06/2018 0545   GFRAA 66 10/25/2013 1042   Lipase     Component Value Date/Time   LIPASE 31 12/01/2018 0225       Studies/Results: No results  found.  Anti-infectives: Anti-infectives (From admission, onward)   Start     Dose/Rate Route Frequency Ordered Stop   12/03/18 1300  cefTRIAXone (ROCEPHIN) 1 g in sodium chloride 0.9 % 100 mL IVPB     1 g 200 mL/hr over 30 Minutes Intravenous Every 24 hours 12/03/18 1206 12/05/18 1435     Assessment/Plan SBO, recurrent  - afebrile, VSS - bowel function returned, pain resolved - advance to SOFT diet - stable for discharge, we discussed dietary recommendations.   LOS: 6 days     Jordan [name], [facility] Pager: [phone]

## 2018-12-07 NOTE — Care Management Important Message (Signed)
Important Message  Patient Details  Name: ARETA TERWILLIGER MRN: [id number] Date of Birth: 16-Apr-1951   Medicare Important Message Given:  Yes    Kerin Salen 12/07/2018, 1:30 [place] Message  Patient Details  Name: ANTOINETTA BERRONES MRN: [id number] Date of Birth: [birth year]   Medicare Important Message Given:  Yes    Kerin Salen 12/07/2018, 1:30 PM

## 2018-12-07 NOTE — Discharge Summary (Signed)
Discharge Summary  Jordan [name] KVQ:[id number] DOB: 09-15-1951  PCP: [name], NP  Admit date: 12/01/2018 Discharge date: 12/07/2018  Time spent: 35 mins   Recommendations for Outpatient Follow-up:  1. PCP in 1 week  Discharge Diagnoses:  Active Hospital Problems   Diagnosis Date Noted  . SBO (small bowel obstruction) ([place]) 12/01/2018  . Hypokalemia 12/01/2018  . Tobacco abuse 12/01/2018  . GERD (gastroesophageal reflux disease) 01/03/2012  . Hyperglycemia 05/08/2011  . Hyperlipidemia 01/16/2009  . Obesity, Class III, BMI 40-49.9 (morbid obesity) ([place]) 02/17/2008  . Essential hypertension 09/04/2007  . Depression 09/04/2007    Resolved Hospital Problems  No resolved problems to display.    Discharge Condition: Stable   Diet recommendation: Heart healthy  Vitals:   12/07/18 0410 12/07/18 1144  BP: 140/90 (!) 142/80  Pulse: 60   Resp: 18   Temp: 98 F (36.7 C)   SpO2: 97%     History of present illness:  Jordan [name] a 67 y.o.female34 year old female with a past medical history significant for hypertension, hyperlipidemia, hyperglycemia, gastroesophageal reflux disease, tobacco abuse and morbid obesity; who presented to the emergency department secondary to abdominal pain, nausea and vomiting. Patient reports symptoms started on 11/29/2018 and despite slight improvement at nighttime and Sunday they started to worsen again since Monday midday. Pain is localized in the middle section and lower part of her abdomen,not radiated, constant, and also with decreased appetite. In ED evaluation has demonstrated mild hypokalemia and CT abdomen/abdominal x-ray demonstrating SBO with transition point. General surgery was consulted, IV fluids initiated an NG tube placed. Patient also received IV potassium, pain medication and antiemetics.     Today, patient reported feeling much better, denies any nausea/vomiting, abdominal pain, fever/chills.  Patient was  able to tolerate her soft diet, had a more formed stool today.  Patient stable to be discharged and follow-up with her PCP within 1 week.  She is advised to have soft diet in smaller quantities and advance as tolerated.   Hospital Course:  Principal Problem:   SBO (small bowel obstruction) (H[facility]) Active Problems:   Hyperlipidemia   Obesity, Class III, BMI 40-49.9 (morbid obesity) ([facility])   Depression   Essential hypertension   Hyperglycemia   GERD (gastroesophageal reflux disease)   Hypokalemia   Tobacco abuse  SBO (small bowel obstruction) ([place]). Improving, passing gas, soft BM, tolerated soft diet -Appears to be associated with adhesions as transition point was seen on CT abdomen(patient has history of multiple abdominal surgeries in the past) -Repeat Xray with improving SBO -General surgery on board: S/p NG tube placement (intermittent suction), removed on 12/05/18. Diet advanced, tolerating  -Follow up with PCP in 1 week  Morbid Obesity, Class III, BMI 40-49.9 (morbid obesity) ([place]) -Body mass index is 41.88 kg/m. -Lifestyle modification  Depression Stable mood Continue Cymbalta  Essential hypertension Continue home regimen  GERD (gastroesophageal reflux disease) Continue Protonix  Hypokalemia Resolved  Tobacco abuse Counseled on cessation. Patient declined nicotine patch  UTI/cystitis Patient complaining of urine odor, change in color. UA showing positive leukocyte esterase. Completed 3 doses of ceftriaxone.        Malnutrition Type:      Malnutrition Characteristics:      Nutrition Interventions:      Estimated body mass index is 41.88 kg/m as calculated from the following:   Height as of this encounter: 5\' 4"  (1.626 m).   Weight as of this encounter: 110.7 kg.    Procedures:  NOne  Consultations:  General surgery  Discharge Exam: BP (!) 142/80 (BP Location: Right Arm)   Pulse 60   Temp 98 F (36.7 C) (Oral)   Resp 18    Ht 5\' 4"  (1.626 m)   Wt 110.7 kg   SpO2 97%   BMI 41.88 kg/m   General: NAD  Cardiovascular: S1, S2 present Respiratory: CTA B  Discharge Instructions You were cared for by a hospitalist during your hospital stay. If you have any questions about your discharge medications or the care you received while you were in the hospital after you are discharged, you can call the unit and asked to speak with the hospitalist on call if the hospitalist that took care of you is not available. Once you are discharged, your primary care physician will handle any further medical issues. Please note that NO REFILLS for any discharge medications will be authorized once you are discharged, as it is imperative that you return to your primary care physician (or establish a relationship with a primary care physician if you do not have one) for your aftercare needs so that they can reassess your need for medications and monitor your lab values.   Allergies as of 12/07/2018   No Known Allergies     Medication List    TAKE these medications   allopurinol 300 MG tablet Commonly known as:  ZYLOPRIM TAKE 1/2 TABLET BY MOUTH 2 TIMES A DAY What changed:    how much to take  how to take this  when to take this   aspirin EC 81 MG tablet Take 81 mg by mouth daily.   Cholecalciferol 25 MCG (1000 UT) capsule Take 1,000 Units by mouth daily.   DULoxetine 60 MG capsule Commonly known as:  CYMBALTA Take 1 capsule po daily What changed:    how much to take  how to take this  when to take this  additional instructions   furosemide 40 MG tablet Commonly known as:  LASIX Take 1 tablet (40 mg total) by mouth daily as needed. What changed:    when to take this  additional instructions   multivitamin with minerals Tabs tablet Take 1 tablet by mouth daily.   pantoprazole 40 MG tablet Commonly known as:  PROTONIX Take 1 tablet (40 mg total) by mouth daily.   phentermine 37.5 MG tablet Commonly  known as:  ADIPEX-P Take 18.75 mg by mouth 3 (three) times a week.   triamterene-hydrochlorothiazide 37.5-25 MG capsule Commonly known as:  DYAZIDE Take 1 each (1 capsule total) by mouth every morning. What changed:    when to take this  additional instructions      No Known Allergies Follow-up Information    [name], NP. Schedule an appointment as soon as possible for a visit in 1 week(s).   Specialty:  Internal Medicine Contact information: [place] [id number] [phone]            The results of significant diagnostics from this hospitalization (including imaging, microbiology, ancillary and laboratory) are listed below for reference.    Significant Diagnostic Studies: Dg Abd 1 View  Result Date: 12/05/2018 CLINICAL DATA:  Partial small bowel obstruction. EXAM: ABDOMEN - 1 VIEW COMPARISON:  Radiographs of December 04, 2018. FINDINGS: Improved small bowel dilatation is noted suggesting improving partial small bowel obstruction or ileus. Nasogastric tube is seen looped in proximal stomach. Phleboliths are noted in the pelvis. IMPRESSION: Improved small bowel dilatation is noted suggesting improving small bowel  obstruction or ileus. Electronically Signed   By: Marijo Conception, M.D.   On: 12/05/2018 07:55   Dg Abd 1 View  Result Date: 12/04/2018 CLINICAL DATA:  Small-bowel obstruction EXAM: ABDOMEN - 1 VIEW COMPARISON:  12/02/2018 FINDINGS: NG tube coiled in the body the stomach. Moderately dilated small bowel unchanged. Colonic gas extends into the rectum without dilatation of the colon. No other new findings IMPRESSION: Small-bowel dilatation unchanged most compatible with partial small bowel obstruction. Gas in nondilated colon. Gas in the rectum. Electronically Signed   By: Franchot Gallo M.D.   On: 12/04/2018 06:49   Ct Abdomen Pelvis W Contrast  Result Date: 12/01/2018 CLINICAL DATA:  66 year old female with abdominal  pain. EXAM: CT ABDOMEN AND PELVIS WITH CONTRAST TECHNIQUE: Multidetector CT imaging of the abdomen and pelvis was performed using the standard protocol following bolus administration of intravenous contrast. CONTRAST:  183mL ISOVUE-300 IOPAMIDOL (ISOVUE-300) INJECTION 61% COMPARISON:  CT of the abdomen pelvis dated 07/27/2016 FINDINGS: Lower chest: The visualized lungs are clear. There is coronary vascular calcification. No intra-abdominal free air. Trace free fluid within the pelvis. Hepatobiliary: Probable mild fatty infiltration of the liver. Cholecystectomy. No calcified stone noted in the central CBD. Mild central intrahepatic biliary ductal dilatation. Pancreas: Unremarkable. No pancreatic ductal dilatation or surrounding inflammatory changes. Spleen: Normal in size without focal abnormality. Adrenals/Urinary Tract: The adrenal glands are unremarkable. Mild bilateral renal parenchyma atrophy and cortical thinning and irregularity likely related to prior scarring. There is no hydronephrosis on either side. There is symmetric enhancement and excretion of contrast by both kidneys. The visualized ureters and urinary bladder appear unremarkable. Stomach/Bowel: The stomach is distended with oral content and air. Multiple dilated loops of small bowel with air-fluid levels measure up to 4.7 cm in diameter. There is a transition point in the lower abdomen (series 2, image 80 and coronal series 5, image 36) were there is tethering of multiple loops of adjacent small bowel likely secondary to adhesions. There is sigmoid diverticulosis without active inflammatory changes. Appendectomy. Vascular/Lymphatic: Moderate aortoiliac atherosclerotic disease. No portal venous gas. There is no adenopathy. Reproductive: Hysterectomy. No pelvic mass. Other: Midline vertical anterior pelvic wall incisional scar. Musculoskeletal: Degenerative changes of the spine and multilevel facet arthropathy. Total left hip arthroplasty. Grade 1  L4-L5 anterolisthesis. No acute osseous pathology. IMPRESSION: 1. Small-bowel obstruction with transition point in the lower abdomen, likely secondary to adhesions. 2. Sigmoid diverticulosis without active inflammatory changes. Electronically Signed   By: Anner Crete M.D.   On: 12/01/2018 05:29   Dg Chest Portable 1 View  Result Date: 12/01/2018 CLINICAL DATA:  Nasogastric tube placement EXAM: PORTABLE CHEST 1 VIEW COMPARISON:  Abdominal CT earlier today FINDINGS: Nasogastric tube with tip at least reaching the stomach. Stable low volumes. There is no edema, consolidation, effusion, or pneumothorax. Normal heart size and mediastinal contours. IMPRESSION: Nasogastric tube with tip at least reaching the stomach. Electronically Signed   By: Monte Fantasia M.D.   On: 12/01/2018 08:23   Dg Abdomen Acute W/chest  Result Date: 12/01/2018 CLINICAL DATA:  67 year old female with abdominal pain and vomiting. EXAM: DG ABDOMEN ACUTE W/ 1V CHEST COMPARISON:  CT of the abdomen pelvis dated 12/01/2018 FINDINGS: The lungs are clear. There is no pleural effusion or pneumothorax. The cardiac silhouette is within normal limits. Dilated loops of small bowel with air-fluid levels most consistent with small-bowel obstruction. No free air. The osseous structures and soft tissues are grossly unremarkable. IMPRESSION: 1. Small-bowel obstruction. 2. No acute cardiopulmonary  process. Electronically Signed   By: Anner Crete M.D.   On: 12/01/2018 05:31   Dg Abd Portable 1v-small Bowel Obstruction Protocol-initial, 8 Hr Delay  Result Date: 12/02/2018 CLINICAL DATA:  8 hour delay, small bowel protocol EXAM: PORTABLE ABDOMEN - 1 VIEW COMPARISON:  12/01/2018, CT 12/01/2018 FINDINGS: Esophageal tube is coiled in the proximal stomach. Persistent dilated loops of small bowel measuring up to 5.1 cm without significant interval change. There is gas present in the colon. Faint contrast is visible within the left colon and  rectum. Left hip replacement is noted. IMPRESSION: 1. Esophageal tube tip coiled in the stomach 2. No significant change in dilated small bowel consistent with bowel obstruction. There is gas present within the colon and faint contrast is present in the colon and rectum. Electronically Signed   By: Donavan Foil M.D.   On: 12/02/2018 20:31   Dg Abd Portable 1v-small Bowel Obstruction Protocol-initial, 8 Hr Delay  Result Date: 12/01/2018 CLINICAL DATA:  67 y/o  F; 8 hour small bowel protocol. EXAM: PORTABLE ABDOMEN - 1 VIEW COMPARISON:  12/01/2018 CT abdomen and pelvis. FINDINGS: Persistent diffuse small bowel obstruction. Coiling enteric tube with tip projecting over gastric body. Leftward curvature and degenerative changes of the lumbar spine. IMPRESSION: Persistent diffuse small bowel obstruction similar to CT given differences in technique. Electronically Signed   By: Kristine Garbe M.D.   On: 12/01/2018 22:37    Microbiology: No results found for this or any previous visit (from the past 240 hour(s)).   Labs: Basic Metabolic Panel: Recent Labs  Lab 12/01/18 0225 12/01/18 0243 12/01/18 1540 12/02/18 0511 12/03/18 0516 12/05/18 0523 12/06/18 0545  NA 143 144  --  148* 149* 142 141  K 3.1* 3.1*  --  3.9 3.8 3.6 3.5  CL 108 107  --  117* 117* 108 109  CO2 23  --   --  23 25 24 23   GLUCOSE 148* 147*  --  92 91 102* 105*  BUN 17 16  --  12 13 15 10   CREATININE 0.81 0.90  --  0.79 0.75 0.74 0.68  CALCIUM 9.6  --   --  8.5* 9.0 8.9 8.8*  MG  --   --  1.7  --   --   --   --   PHOS  --   --  3.5  --   --   --   --    Liver Function Tests: Recent Labs  Lab 12/01/18 0225  AST 27  ALT 38  ALKPHOS 102  BILITOT 0.6  PROT 7.2  ALBUMIN 4.5   Recent Labs  Lab 12/01/18 0225  LIPASE 31   No results for input(s): AMMONIA in the last 168 hours. CBC: Recent Labs  Lab 12/01/18 0225 12/01/18 0243 12/02/18 0511 12/03/18 0516 12/05/18 0523 12/06/18 0545  WBC 12.1*  --   7.5 6.8 5.4 5.3  NEUTROABS  --   --   --   --   --  2.4  HGB 15.2* 16.3* 12.1 12.1 12.7 12.8  HCT 46.5* 48.0* 38.5 38.6 39.8 39.5  MCV 94.5  --  99.7 99.7 94.8 95.2  PLT 232  --  158 153 161 175   Cardiac Enzymes: No results for input(s): CKTOTAL, CKMB, CKMBINDEX, TROPONINI in the last 168 hours. BNP: BNP (last 3 results) No results for input(s): BNP in the last 8760 hours.  ProBNP (last 3 results) No results for input(s): PROBNP in the last 8760 hours.  CBG: Recent Labs  Lab 12/04/18 1738 12/05/18 0014 12/05/18 0538 12/05/18 1152 12/05/18 1658  GLUCAP 106* 88 94 86 107*       Signed:  Alma Friendly, MD [facility] 12/07/2018, 12:41 PM

## 2018-12-10 ENCOUNTER — Telehealth: Payer: Self-pay

## 2018-12-10 MED FILL — PANTOPRAZOLE SOD DR 40 MG T: 40 | 90 days supply | Qty: 90 | Fill #1

## 2018-12-10 NOTE — Telephone Encounter (Signed)
Thank you we will see her for TCM

## 2018-12-10 NOTE — Telephone Encounter (Signed)
Transition Care Management Follow-up Telephone Call  ADMISSION DATE: 12/01/18 DISCHARGE DATE: 12/07/18   How have you been since you were released from the hospital? Feeling better just weak from being in hospital.   Do you understand why you were in the hospital? Yes   Do you understand the discharge instrcutions? Yes  Items Reviewed:  Medications reviewed: Yes. Patient states she has Phentermine on hand but is not taking it.    Allergies reviewed: NKDA  Dietary changes reviewed: Heart healthy  Referrals reviewed: Appointment scheduled.  Functional Questionnaire:   Activities of Daily Living (ADLs): Patient can  perform all ADL's independently.  Any patient concerns? Patient wonders why BS dropped and BP increased during hospital stay.    Confirmed importance and date/time of follow-up visits scheduled:Yes   Confirmed with patient if condition begins to worsen call PCP or go to the ER. Yes     Patient was given the office number and encouragred to call back with questions or concerns.Yes

## 2018-12-11 ENCOUNTER — Inpatient Hospital Stay: Payer: Medicare Other | Admitting: Family

## 2018-12-11 ENCOUNTER — Encounter: Payer: Self-pay | Admitting: Internal Medicine

## 2018-12-11 ENCOUNTER — Ambulatory Visit (INDEPENDENT_AMBULATORY_CARE_PROVIDER_SITE_OTHER): Payer: Medicare Other | Admitting: Internal Medicine

## 2018-12-11 VITALS — BP 128/76 | HR 74 | Temp 97.7°F | Resp 16 | Ht 63.0 in | Wt 243.0 lb

## 2018-12-11 DIAGNOSIS — Z8719 Personal history of other diseases of the digestive system: Secondary | ICD-10-CM

## 2018-12-11 DIAGNOSIS — I10 Essential (primary) hypertension: Secondary | ICD-10-CM | POA: Diagnosis not present

## 2018-12-11 DIAGNOSIS — K56609 Unspecified intestinal obstruction, unspecified as to partial versus complete obstruction: Secondary | ICD-10-CM

## 2018-12-11 LAB — CBC WITH DIFFERENTIAL/PLATELET
BASOS ABS: 0.1 10*3/uL (ref 0.0–0.1)
Basophils Relative: 0.8 % (ref 0.0–3.0)
EOS ABS: 0.1 10*3/uL (ref 0.0–0.7)
Eosinophils Relative: 1.6 % (ref 0.0–5.0)
HCT: 44.8 % (ref 36.0–46.0)
Hemoglobin: 14.8 g/dL (ref 12.0–15.0)
Lymphocytes Relative: 35.9 % (ref 12.0–46.0)
Lymphs Abs: 2.6 10*3/uL (ref 0.7–4.0)
MCHC: 33 g/dL (ref 30.0–36.0)
MCV: 95.5 fl (ref 78.0–100.0)
Monocytes Absolute: 0.4 10*3/uL (ref 0.1–1.0)
Monocytes Relative: 5.6 % (ref 3.0–12.0)
Neutro Abs: 4 10*3/uL (ref 1.4–7.7)
Neutrophils Relative %: 56.1 % (ref 43.0–77.0)
Platelets: 224 10*3/uL (ref 150.0–400.0)
RBC: 4.69 Mil/uL (ref 3.87–5.11)
RDW: 15 % (ref 11.5–15.5)
WBC: 7.2 10*3/uL (ref 4.0–10.5)

## 2018-12-11 LAB — BASIC METABOLIC PANEL
BUN: 9 mg/dL (ref 6–23)
CO2: 31 mEq/L (ref 19–32)
CREATININE: 0.88 mg/dL (ref 0.40–1.20)
Calcium: 9.7 mg/dL (ref 8.4–10.5)
Chloride: 101 mEq/L (ref 96–112)
GFR: 67.87 mL/min (ref >60.00)
Glucose, Bld: 104 mg/dL — ABNORMAL HIGH (ref 70–99)
Potassium: 3.9 mEq/L (ref 3.5–5.1)
Sodium: 142 mEq/L (ref 135–145)

## 2018-12-11 NOTE — Progress Notes (Signed)
Subjective:    Patient ID: Jordan [name], female    DOB: 1951-06-22, 67 y.o.   MRN: [id number]  DOS:  12/11/2018 Type of visit - description : TCM 7 Patient was admitted to the hospital and discharged 12/07/2018.  She presented with abdominal pain, nausea and vomiting. Upon admission, she had hypokalemia and CT showed SBO with transition point. Surgery consulted.  She was treated conservatively. She eventually improved. Hypokalemia resolved She also had cystitis, treated with ceftriaxone.  Other problems were stable.    Review of Systems Since she left the hospital and she is gradually better. Appetite is a still slightly decreased but is tolerated food and drinks well. No abdominal pain. No fever chills. No nausea, vomiting, diarrhea.  Had a normal bowel movement yesterday.   Past Medical History:  Diagnosis Date  . Cellulitis and abscess of finger, unspecified 401.9  . Cellulitis and abscess of foot, except toes   . Depressive disorder, not elsewhere classified   . GERD (gastroesophageal reflux disease)   . Gout, unspecified   . Hyperglycemia 05/08/2011  . Hyperlipidemia    borderline- not on meds  . Insomnia   . Leg edema    chronic  . Obesity, unspecified   . Osteoarthritis   . Renal insufficiency, mild 05/08/2011  . Small bowel obstruction [facility])     Past Surgical History:  Procedure Laterality Date  . ABDOMINAL HYSTERECTOMY  2002   infection- no history of cancer  . APPENDECTOMY  2002  . CHOLECYSTECTOMY  1977  . ECTOPIC PREGNANCY SURGERY  1971  . right shoulder rotator cuff repair  06-07-10  . TOTAL HIP ARTHROPLASTY Left 1998    Social History   Socioeconomic History  . Marital status: Single    Spouse name: Not on file  . Number of children: 2  . Years of education: Not on file  . Highest education level: Not on file  Occupational History    Employer: [employer]  . Financial resource strain: Not on file  . Food insecurity:   Worry: Not on file    Inability: Not on file  . Transportation needs:    Medical: Not on file    Non-medical: Not on file  Tobacco Use  . Smoking status: Current Some Day Smoker    Packs/day: 0.25    Types: Cigarettes    Last attempt to quit: 12/26/2014    Years since quitting: 3.9  . Smokeless tobacco: Never Used  Substance and Sexual Activity  . Alcohol use: Yes    Alcohol/week: 0.0 standard drinks    Comment: rare  . Drug use: No  . Sexual activity: Never  Lifestyle  . Physical activity:    Days per week: Not on file    Minutes per session: Not on file  . Stress: Not on file  Relationships  . Social connections:    Talks on phone: Not on file    Gets together: Not on file    Attends religious service: Not on file    Active member of club or organization: Not on file    Attends meetings of clubs or organizations: Not on file    Relationship status: Not on file  . Intimate partner violence:    Fear of current or ex partner: Not on file    Emotionally abused: Not on file    Physically abused: Not on file    Forced sexual activity: Not on file  Other Topics Concern  .  Not on file  Social History Narrative   2 children (daughter and son) both local. 3 grandchildren, one great grandchild   Works at ED front desk   Divorced (married x 30 years)      Allergies as of 12/11/2018   No Known Allergies     Medication List       Accurate as of December 11, 2018 10:54 AM. Always use your most recent med list.        allopurinol 300 MG tablet Commonly known as:  ZYLOPRIM TAKE 1/2 TABLET BY MOUTH 2 TIMES A DAY   aspirin EC 81 MG tablet Take 81 mg by mouth daily.   Cholecalciferol 25 MCG (1000 UT) capsule Take 1,000 Units by mouth daily.   DULoxetine 60 MG capsule Commonly known as:  CYMBALTA Take 1 capsule po daily   furosemide 40 MG tablet Commonly known as:  LASIX Take 1 tablet (40 mg total) by mouth daily as needed.   multivitamin with minerals Tabs  tablet Take 1 tablet by mouth daily.   pantoprazole 40 MG tablet Commonly known as:  PROTONIX Take 1 tablet (40 mg total) by mouth daily.   phentermine 37.5 MG tablet Commonly known as:  ADIPEX-P Take 18.75 mg by mouth 3 (three) times a week.   triamterene-hydrochlorothiazide 37.5-25 MG capsule Commonly known as:  DYAZIDE Take 1 each (1 capsule total) by mouth every morning.           Objective:   Physical Exam BP 128/76 (BP Location: Right Arm, Patient Position: Sitting, Cuff Size: Normal)   Pulse 74   Temp 97.7 F (36.5 C) (Oral)   Resp 16   Ht 5\' 3"  (1.6 m)   Wt 243 lb (110.2 kg)   SpO2 97%   BMI 43.05 kg/m  General:   Well developed, NAD, BMI noted.  HEENT:  Normocephalic . Face symmetric, atraumatic Lungs:  CTA B Normal respiratory effort, no intercostal retractions, no accessory muscle use. Heart: RRR,  no murmur.  no pretibial edema bilaterally  Abdomen:  Not distended, soft, non-tender. No rebound or rigidity.   Skin: Not pale. Not jaundice Neurologic:  alert & oriented X3.  Speech normal, gait appropriate for age and unassisted Psych--  Cognition and judgment appear intact.  Cooperative with normal attention span and concentration.  Behavior appropriate. No anxious or depressed appearing.     Assessment & Plan:   67 year old female, history of hypertension, depression, obesity, presents for hospital follow-up SBO Was admitted with SBO, resolved with conservative treatment, patient reports this is her second episode. At the hospital BP was a slightly elevated, potassium was low. She is doing very well, GI symptoms are essentially resolved. She is taking Dyazide daily for blood pressure and has taken Lasix every other day since she left the hospital.  She takes it as needed for swelling. Plan: BMP, CBC, follow-up with PCP 02-2019 as schedule. Recommend to use Lasix only as needed for swelling not qod

## 2018-12-11 NOTE — Patient Instructions (Signed)
  GO TO THE LAB : Get the blood work     

## 2018-12-11 NOTE — Progress Notes (Signed)
Pre visit review using our clinic review tool, if applicable. No additional management support is needed unless otherwise documented below in the visit note. 

## 2018-12-30 ENCOUNTER — Encounter: Payer: Self-pay | Admitting: Family

## 2019-01-06 MED FILL — ALLOPURINOL 300 MG TABLET: 300 | 90 days supply | Qty: 90 | Fill #1

## 2019-01-06 MED FILL — TRIAMTERENE/HCTZ 37.5/25 CP: 37.5-25 | 90 days supply | Qty: 90 | Fill #1

## 2019-01-06 MED FILL — DULOXETINE HCL 60 MG CPEP: 60 | 90 days supply | Qty: 90 | Fill #1

## 2019-03-08 ENCOUNTER — Other Ambulatory Visit: Payer: Self-pay | Admitting: Family

## 2019-03-08 ENCOUNTER — Ambulatory Visit: Payer: Medicare Other | Admitting: Family

## 2019-03-08 MED FILL — PANTOPRAZOLE SOD DR 40 MG T: 40 | 90 days supply | Qty: 90 | Fill #0

## 2019-03-15 ENCOUNTER — Encounter: Payer: Self-pay | Admitting: Family

## 2019-03-15 ENCOUNTER — Ambulatory Visit (INDEPENDENT_AMBULATORY_CARE_PROVIDER_SITE_OTHER): Payer: PRIVATE HEALTH INSURANCE | Admitting: Family

## 2019-03-15 ENCOUNTER — Other Ambulatory Visit: Payer: Self-pay

## 2019-03-15 ENCOUNTER — Telehealth: Payer: Self-pay | Admitting: Family

## 2019-03-15 VITALS — BP 126/68 | HR 78 | Temp 97.9°F | Resp 18 | Ht 63.0 in | Wt 251.6 lb

## 2019-03-15 DIAGNOSIS — I10 Essential (primary) hypertension: Secondary | ICD-10-CM | POA: Diagnosis not present

## 2019-03-15 DIAGNOSIS — J40 Bronchitis, not specified as acute or chronic: Secondary | ICD-10-CM | POA: Diagnosis not present

## 2019-03-15 DIAGNOSIS — M109 Gout, unspecified: Secondary | ICD-10-CM | POA: Diagnosis not present

## 2019-03-15 DIAGNOSIS — E119 Type 2 diabetes mellitus without complications: Secondary | ICD-10-CM | POA: Diagnosis not present

## 2019-03-15 DIAGNOSIS — K219 Gastro-esophageal reflux disease without esophagitis: Secondary | ICD-10-CM

## 2019-03-15 DIAGNOSIS — F329 Major depressive disorder, single episode, unspecified: Secondary | ICD-10-CM | POA: Diagnosis not present

## 2019-03-15 DIAGNOSIS — F32A Depression, unspecified: Secondary | ICD-10-CM

## 2019-03-15 LAB — BASIC METABOLIC PANEL
BUN: 26 mg/dL — ABNORMAL HIGH (ref 6–23)
CO2: 25 mEq/L (ref 19–32)
Calcium: 9.7 mg/dL (ref 8.4–10.5)
Chloride: 104 mEq/L (ref 96–112)
Creatinine, Ser: 0.93 mg/dL (ref 0.40–1.20)
GFR: 59.87 mL/min — ABNORMAL LOW (ref >60.00)
Glucose, Bld: 94 mg/dL (ref 70–99)
POTASSIUM: 4.4 meq/L (ref 3.5–5.1)
SODIUM: 138 meq/L (ref 135–145)

## 2019-03-15 LAB — HEMOGLOBIN A1C: Hgb A1c MFr Bld: 6.1 % (ref 4.6–6.5)

## 2019-03-15 MED ORDER — AZITHROMYCIN 250 MG PO TABS: ORAL_TABLET | ORAL | 0 refills | Status: DC

## 2019-03-15 MED ORDER — FUROSEMIDE 40 MG PO TABS: 40.0000 mg | ORAL_TABLET | Freq: Every day | ORAL | 1 refills | Status: DC | PRN

## 2019-03-15 MED FILL — AZITHROMYCIN 250 MG TABLET: 250 | 5 days supply | Qty: 6 | Fill #0

## 2019-03-15 MED FILL — FUROSEMIDE 40 MG TAB: 40 | 90 days supply | Qty: 90 | Fill #0

## 2019-03-15 NOTE — Progress Notes (Signed)
Subjective:    Patient ID: Jordan [name], female    DOB: 09-10-1951, 68 y.o.   MRN: [id number]  HPI   Patient is a 68 yr old female who presents today for follow up.  DM2- diet controlled.  Lab Results  Component Value Date   HGBA1C 6.2 09/16/2018   HGBA1C 6.0 06/10/2018   HGBA1C 6.5 03/04/2018   Lab Results  Component Value Date   MICROALBUR <0.7 06/10/2018   LDLCALC 93 06/10/2018   CREATININE 0.88 12/11/2018   Depression- continues cymbalta. Reports mood is stable.   GERD- maintained on protonix.  Reports symptoms are stable. "I can tell when I miss it."   HTN- continues dyazide. BP Readings from Last 3 Encounters:  03/15/19 126/68  12/11/18 128/76  12/07/18 (!) 170/87   Gout-  Reports that she sometimes has some arthritis in her hands.  Continues allopurinol   Reports 2 month hx of cough. Has been taking mucinex which helps to break it up. Denies SOB/wheeze/fever.+ sputum production- yellow in color.     Review of Systems See HPI  Past Medical History:  Diagnosis Date  . Cellulitis and abscess of finger, unspecified 401.9  . Cellulitis and abscess of foot, except toes   . Depressive disorder, not elsewhere classified   . GERD (gastroesophageal reflux disease)   . Gout, unspecified   . Hyperglycemia 05/08/2011  . Hyperlipidemia    borderline- not on meds  . Insomnia   . Leg edema    chronic  . Obesity, unspecified   . Osteoarthritis   . Renal insufficiency, mild 05/08/2011  . Small bowel obstruction (HCC)      Social History   Socioeconomic History  . Marital status: Single    Spouse name: Not on file  . Number of children: 2  . Years of education: Not on file  . Highest education level: Not on file  Occupational History  . Occupation: works prn at [employer]  . Financial resource strain: Not on file  . Food insecurity:    Worry: Not on file    Inability: Not on file  . Transportation needs:    Medical: Not  on file    Non-medical: Not on file  Tobacco Use  . Smoking status: Current Some Day Smoker    Packs/day: 0.25    Types: Cigarettes    Last attempt to quit: 12/26/2014    Years since quitting: 4.2  . Smokeless tobacco: Never Used  Substance and Sexual Activity  . Alcohol use: Yes    Alcohol/week: 0.0 standard drinks    Comment: rare  . Drug use: No  . Sexual activity: Never  Lifestyle  . Physical activity:    Days per week: Not on file    Minutes per session: Not on file  . Stress: Not on file  Relationships  . Social connections:    Talks on phone: Not on file    Gets together: Not on file    Attends religious service: Not on file    Active member of club or organization: Not on file    Attends meetings of clubs or organizations: Not on file    Relationship status: Not on file  . Intimate partner violence:    Fear of current or ex partner: Not on file    Emotionally abused: Not on file    Physically abused: Not on file    Forced sexual activity: Not on file  Other Topics Concern  . Not on file  Social History Narrative   2 children (daughter and son) both local. 3 grandchildren, one great grandchild   Works at ED front desk   Divorced (married x 30 years)    Past Surgical History:  Procedure Laterality Date  . ABDOMINAL HYSTERECTOMY  2002   infection- no history of cancer  . APPENDECTOMY  2002  . CHOLECYSTECTOMY  1977  . ECTOPIC PREGNANCY SURGERY  1971  . right shoulder rotator cuff repair  06-07-10  . TOTAL HIP ARTHROPLASTY Left 1998    Family History  Problem Relation Age of Onset  . Diabetes Mother   . Cancer Mother        colon/ pancreatic  . Colon cancer Mother 85  . Hypertension Father   . Alcohol abuse Father   . Cirrhosis Father   . Other Sister        Pyeoderma gangrenosis  . Obesity Daughter   . Diabetes Sister        type 2  . Kidney failure Sister   . Liver disease Sister   . Heart attack Brother   . Cirrhosis Sister        had NASH,  died heart failure    No Known Allergies  Current Outpatient Medications on File Prior to Visit  Medication Sig Dispense Refill  . allopurinol (ZYLOPRIM) 300 MG tablet TAKE 1/2 TABLET BY MOUTH 2 TIMES A DAY (Patient taking differently: Take 300 mg by mouth daily. TAKE 1/2 TABLET BY MOUTH 2 TIMES A DAY) 90 tablet 1  . aspirin EC 81 MG tablet Take 81 mg by mouth daily.    . Cholecalciferol 25 MCG (1000 UT) capsule Take 1,000 Units by mouth daily.    . DULoxetine (CYMBALTA) 60 MG capsule Take 1 capsule po daily (Patient taking differently: Take 60 mg by mouth daily. ) 90 capsule 1  . furosemide (LASIX) 40 MG tablet Take 1 tablet (40 mg total) by mouth daily as needed. (Patient taking differently: Take 40 mg by mouth every other day. Alternate days with Dyazide) 90 tablet 1  . Multiple Vitamin (MULTIVITAMIN WITH MINERALS) TABS tablet Take 1 tablet by mouth daily.    . pantoprazole (PROTONIX) 40 MG tablet TAKE 1 TABLET (40 MG TOTAL) BY MOUTH DAILY. 90 tablet 1  . phentermine (ADIPEX-P) 37.5 MG tablet Take 18.75 mg by mouth 3 (three) times a week.   0  . triamterene-hydrochlorothiazide (DYAZIDE) 37.5-25 MG capsule Take 1 each (1 capsule total) by mouth every morning. (Patient taking differently: Take 1 capsule by mouth every other day. Alternate days with Furosemide) 90 capsule 1  . [DISCONTINUED] famotidine (PEPCID AC) 10 MG chewable tablet Chew 10 mg by mouth daily.       No current facility-administered medications on file prior to visit.     BP 126/68 (BP Location: Left Arm, Patient Position: Sitting, Cuff Size: Large)   Pulse 78   Temp 97.9 F (36.6 C) (Oral)   Resp 18   Ht 5\' 3"  (1.6 m)   Wt 251 lb 9.6 oz (114.1 kg)   SpO2 99%   BMI 44.57 kg/m       Objective:   Physical Exam Constitutional:      Appearance: She is well-developed.  Neck:     Musculoskeletal: Neck supple.     Thyroid: No thyromegaly.  Cardiovascular:     Rate and Rhythm: Normal rate and regular rhythm.      Heart sounds: Normal  heart sounds. No murmur.  Pulmonary:     Effort: Pulmonary effort is normal. No respiratory distress.     Breath sounds: Normal breath sounds. No wheezing.  Skin:    General: Skin is warm and dry.  Neurological:     Mental Status: She is alert and oriented to person, place, and time.  Psychiatric:        Behavior: Behavior normal.        Thought Content: Thought content normal.        Judgment: Judgment normal.           Assessment & Plan:  Bronchitis- will rx with azithromycin.  HTN- bp stable. Continue dyazide.  DM2- clinically stable. Obtain follow up A1C.  Gout-  Stable on allopurinol. I think that hand pain is due to OA.    GERD- stable on PPI. Continue same.   Depression- stable on cymbalta.

## 2019-03-15 NOTE — Telephone Encounter (Signed)
Please call [facility] in [place] and request DM eye exam report.

## 2019-03-15 NOTE — Patient Instructions (Signed)
Please complete lab work prior to leaving.   

## 2019-03-15 NOTE — Telephone Encounter (Signed)
All [facility] are currently closed due to [place]

## 2019-03-16 NOTE — Telephone Encounter (Signed)
Noted  

## 2019-04-06 ENCOUNTER — Other Ambulatory Visit: Payer: Self-pay | Admitting: Family

## 2019-04-06 MED FILL — ALLOPURINOL 300 MG TABLET: 300 | 90 days supply | Qty: 90 | Fill #0

## 2019-04-06 MED FILL — DULOXETINE HCL 60 MG CPEP: 60 | 90 days supply | Qty: 90 | Fill #0

## 2019-04-13 ENCOUNTER — Ambulatory Visit (INDEPENDENT_AMBULATORY_CARE_PROVIDER_SITE_OTHER): Payer: PPO | Admitting: Family

## 2019-04-13 DIAGNOSIS — F439 Reaction to severe stress, unspecified: Secondary | ICD-10-CM

## 2019-04-13 DIAGNOSIS — J40 Bronchitis, not specified as acute or chronic: Secondary | ICD-10-CM

## 2019-04-13 NOTE — Progress Notes (Signed)
Virtual Visit via Video Note  I connected with Jordan [name] on 04/13/19 at 10:20 AM EDT by a video enabled telemedicine application and verified that I am speaking with the correct person using two identifiers. This visit type was conducted due to national recommendations for restrictions regarding the COVID-19 Pandemic (e.g. social distancing).  This format is felt to be most appropriate for this patient at this time.   I discussed the limitations of evaluation and management by telemedicine and the availability of in person appointments. The patient expressed understanding and agreed to proceed.  Only the patient and myself were on today's video visit. The patient was at home and I was in my office at the time of today's visit.   History of Present Illness:  Patient is a 69 yr old female who presents today for follow up of her bronchitis. She was seen 4 weeks ago with a 2 month history of cough. Had yellow sputum at that time. We treated her with azithromycin.    She reports that azithromycin helped a lot. Congestion is loose and generally only in the AM. Denies temperature.    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  She reports that she has had a lot of work-related stress due to the [place] outbreak and some difficult patient cases.  This is taken an emotional toll on her.  She states that she took a few days off just to kind of get her self back together.  She feels that that helped a lot and that she is now managing the stress better. Observations/Objective:   Gen: Awake, alert, no acute distress Resp: Breathing is even and non-labored Psych: calm/pleasant demeanor Neuro: Alert and Oriented x 3, + facial symmetry, speech is clear.  Assessment and Plan: Bronchitis- clinically resolved.  We will continue to monitor.  Situational stress- seems to be stable at this time.  We will continue to monitor.   Continue Cymbalta.   A total of 15  minutes were spent with the patient today on video visist.     Follow Up Instructions:    I discussed the assessment and treatment plan with the patient. The patient was provided an opportunity to ask questions and all were answered. The patient agreed with the plan and demonstrated an understanding of the instructions.   The patient was advised to call back or seek an in-person evaluation if the symptoms worsen or if the condition fails to improve as anticipated.    Nance Pear, NP

## 2019-04-23 ENCOUNTER — Ambulatory Visit: Payer: PPO

## 2019-05-04 ENCOUNTER — Other Ambulatory Visit: Payer: Self-pay | Admitting: Family

## 2019-05-04 MED FILL — TRIAMTERENE/HCTZ 37.5/25 CP: 37.5-25 | 90 days supply | Qty: 90 | Fill #0

## 2019-05-13 ENCOUNTER — Encounter: Payer: Self-pay | Admitting: Gastroenterology

## 2019-06-10 MED FILL — PANTOPRAZOLE SOD DR 40 MG T: 40 | 90 days supply | Qty: 90 | Fill #1

## 2019-07-12 MED FILL — DULOXETINE HCL 60 MG CPEP: 60 | 90 days supply | Qty: 90 | Fill #1

## 2019-07-12 MED FILL — ALLOPURINOL 300 MG TABLET: 300 | 90 days supply | Qty: 90 | Fill #1

## 2019-07-16 ENCOUNTER — Ambulatory Visit: Payer: PRIVATE HEALTH INSURANCE | Admitting: Family

## 2019-07-20 ENCOUNTER — Encounter: Payer: Self-pay | Admitting: Family

## 2019-07-20 ENCOUNTER — Telehealth: Payer: Self-pay | Admitting: Family

## 2019-07-20 ENCOUNTER — Ambulatory Visit (INDEPENDENT_AMBULATORY_CARE_PROVIDER_SITE_OTHER): Payer: PPO | Admitting: Family

## 2019-07-20 ENCOUNTER — Other Ambulatory Visit: Payer: Self-pay

## 2019-07-20 VITALS — BP 131/78 | HR 85 | Temp 98.2°F | Resp 16 | Ht 64.0 in | Wt 250.8 lb

## 2019-07-20 DIAGNOSIS — K219 Gastro-esophageal reflux disease without esophagitis: Secondary | ICD-10-CM | POA: Diagnosis not present

## 2019-07-20 DIAGNOSIS — E119 Type 2 diabetes mellitus without complications: Secondary | ICD-10-CM

## 2019-07-20 DIAGNOSIS — M545 Low back pain, unspecified: Secondary | ICD-10-CM

## 2019-07-20 DIAGNOSIS — F329 Major depressive disorder, single episode, unspecified: Secondary | ICD-10-CM | POA: Diagnosis not present

## 2019-07-20 DIAGNOSIS — F32A Depression, unspecified: Secondary | ICD-10-CM

## 2019-07-20 DIAGNOSIS — M109 Gout, unspecified: Secondary | ICD-10-CM | POA: Diagnosis not present

## 2019-07-20 DIAGNOSIS — R739 Hyperglycemia, unspecified: Secondary | ICD-10-CM | POA: Diagnosis not present

## 2019-07-20 LAB — COMPREHENSIVE METABOLIC PANEL
ALT: 32 U/L (ref 0–35)
AST: 27 U/L (ref 0–37)
Albumin: 4.6 g/dL (ref 3.5–5.2)
Alkaline Phosphatase: 104 U/L (ref 39–117)
BUN: 18 mg/dL (ref 6–23)
CO2: 28 mEq/L (ref 19–32)
Calcium: 9.7 mg/dL (ref 8.4–10.5)
Chloride: 102 mEq/L (ref 96–112)
Creatinine, Ser: 1.06 mg/dL (ref 0.40–1.20)
GFR: 51.43 mL/min — ABNORMAL LOW (ref >60.00)
Glucose, Bld: 107 mg/dL — ABNORMAL HIGH (ref 70–99)
Potassium: 4 mEq/L (ref 3.5–5.1)
Sodium: 139 mEq/L (ref 135–145)
Total Bilirubin: 0.6 mg/dL (ref 0.2–1.2)
Total Protein: 6.8 g/dL (ref 6.0–8.3)

## 2019-07-20 LAB — LIPID PANEL
Cholesterol: 190 mg/dL (ref 0–200)
HDL: 50.1 mg/dL (ref >39.00)
NonHDL: 140.3
Total CHOL/HDL Ratio: 4
Triglycerides: 220 mg/dL — ABNORMAL HIGH (ref 0.0–149.0)
VLDL: 44 mg/dL — ABNORMAL HIGH (ref 0.0–40.0)

## 2019-07-20 LAB — URIC ACID: Uric Acid, Serum: 7.5 mg/dL — ABNORMAL HIGH (ref 2.4–7.0)

## 2019-07-20 LAB — HEMOGLOBIN A1C: Hgb A1c MFr Bld: 6.2 % (ref 4.6–6.5)

## 2019-07-20 LAB — LDL CHOLESTEROL, DIRECT: Direct LDL: 111 mg/dL

## 2019-07-20 NOTE — Progress Notes (Signed)
Subjective:    Patient ID: Jordan [name], female    DOB: 22-Jul-1951, 68 y.o.   MRN: [id number]  HPI  Patient is a 68 yr old female who presents today for follow up.  Gout- reports symptoms are stable.  She continues allopurinol.   GERD- reports that symptoms are stable on protonix. Rare need for otc meds prn.   Depression-reports that mood is stable on cymbalta.   HTN- continues dyazide.  Use lasix prn.   Hyperglycemia- Lab Results  Component Value Date   HGBA1C 6.1 03/15/2019   HGBA1C 6.2 09/16/2018   HGBA1C 6.0 06/10/2018   Lab Results  Component Value Date   MICROALBUR <0.7 06/10/2018   LDLCALC 93 06/10/2018   CREATININE 0.93 03/15/2019   She complains of low back pain worse with sitting.    Review of Systems See HPI  Past Medical History:  Diagnosis Date  . Cellulitis and abscess of finger, unspecified 401.9  . Cellulitis and abscess of foot, except toes   . Depressive disorder, not elsewhere classified   . GERD (gastroesophageal reflux disease)   . Gout, unspecified   . Hyperglycemia 05/08/2011  . Hyperlipidemia    borderline- not on meds  . Insomnia   . Leg edema    chronic  . Obesity, unspecified   . Osteoarthritis   . Renal insufficiency, mild 05/08/2011  . Small bowel obstruction (HCC)      Social History   Socioeconomic History  . Marital status: Single    Spouse name: Not on file  . Number of children: 2  . Years of education: Not on file  . Highest education level: Not on file  Occupational History  . Occupation: works prn at [employer]: [employer]  . Financial resource strain: Not on file  . Food insecurity    Worry: Not on file    Inability: Not on file  . Transportation needs    Medical: Not on file    Non-medical: Not on file  Tobacco Use  . Smoking status: Current Some Day Smoker    Packs/day: 0.25    Types: Cigarettes    Last attempt to quit: 12/26/2014    Years since quitting: 4.5  . Smokeless tobacco:  Never Used  Substance and Sexual Activity  . Alcohol use: Yes    Alcohol/week: 0.0 standard drinks    Comment: rare  . Drug use: No  . Sexual activity: Never  Lifestyle  . Physical activity    Days per week: Not on file    Minutes per session: Not on file  . Stress: Not on file  Relationships  . Social Herbalist on phone: Not on file    Gets together: Not on file    Attends religious service: Not on file    Active member of club or organization: Not on file    Attends meetings of clubs or organizations: Not on file    Relationship status: Not on file  . Intimate partner violence    Fear of current or ex partner: Not on file    Emotionally abused: Not on file    Physically abused: Not on file    Forced sexual activity: Not on file  Other Topics Concern  . Not on file  Social History Narrative   2 children (daughter and son) both local. 3 grandchildren, one great grandchild   Works at ED front desk   Divorced (married x  30 years)    Past Surgical History:  Procedure Laterality Date  . ABDOMINAL HYSTERECTOMY  2002   infection- no history of cancer  . APPENDECTOMY  2002  . CHOLECYSTECTOMY  1977  . ECTOPIC PREGNANCY SURGERY  1971  . right shoulder rotator cuff repair  06-07-10  . TOTAL HIP ARTHROPLASTY Left 1998    Family History  Problem Relation Age of Onset  . Diabetes Mother   . Cancer Mother        colon/ pancreatic  . Colon cancer Mother 62  . Hypertension Father   . Alcohol abuse Father   . Cirrhosis Father   . Other Sister        Pyeoderma gangrenosis  . Obesity Daughter   . Diabetes Sister        type 2  . Kidney failure Sister   . Liver disease Sister   . Heart attack Brother   . Cirrhosis Sister        had NASH, died heart failure    No Known Allergies  Current Outpatient Medications on File Prior to Visit  Medication Sig Dispense Refill  . allopurinol (ZYLOPRIM) 300 MG tablet TAKE 1/2 TABLET BY MOUTH 2 TIMES DAILY 90 tablet 1  .  aspirin EC 81 MG tablet Take 81 mg by mouth daily.    . Cholecalciferol 25 MCG (1000 UT) capsule Take 1,000 Units by mouth daily.    . DULoxetine (CYMBALTA) 60 MG capsule TAKE 1 CAPSULE BY MOUTH DAILY 90 capsule 1  . furosemide (LASIX) 40 MG tablet Take 1 tablet (40 mg total) by mouth daily as needed. 90 tablet 1  . Multiple Vitamin (MULTIVITAMIN WITH MINERALS) TABS tablet Take 1 tablet by mouth daily.    . pantoprazole (PROTONIX) 40 MG tablet TAKE 1 TABLET (40 MG TOTAL) BY MOUTH DAILY. 90 tablet 1  . triamterene-hydrochlorothiazide (DYAZIDE) 37.5-25 MG capsule TAKE 1 CAPSULE BY MOUTH ONCE EVERY MORNING 90 capsule 1  . [DISCONTINUED] famotidine (PEPCID AC) 10 MG chewable tablet Chew 10 mg by mouth daily.       No current facility-administered medications on file prior to visit.     BP 131/78 (BP Location: Right Arm, Patient Position: Sitting, Cuff Size: Large)   Pulse 85   Temp 98.2 F (36.8 C) (Oral)   Resp 16   Ht 5\' 4"  (1.626 m)   Wt 250 lb 12.8 oz (113.8 kg)   BMI 43.05 kg/m       Objective:   Physical Exam Constitutional:      Appearance: She is well-developed.  Neck:     Musculoskeletal: Neck supple.     Thyroid: No thyromegaly.  Cardiovascular:     Rate and Rhythm: Normal rate and regular rhythm.     Heart sounds: Normal heart sounds. No murmur.  Pulmonary:     Effort: Pulmonary effort is normal. No respiratory distress.     Breath sounds: Normal breath sounds. No wheezing.  Skin:    General: Skin is warm and dry.  Neurological:     Mental Status: She is alert and oriented to person, place, and time.  Psychiatric:        Behavior: Behavior normal.        Thought Content: Thought content normal.        Judgment: Judgment normal.           Assessment & Plan:  Hypertension-blood pressure stable on current medications.  Obtain follow-up complete metabolic panel.  Hyperglycemia- obtain follow-up A1c.  Gout-clinically stable on current dose of allopurinol.   Continue same.  Depression-stable on Cymbalta.  Continue same.  GERD-stable on Protonix.  Continue same.  Low back pain- advised patient to use Tylenol as needed.  Avoid NSAIDs due to history of renal insufficiency on NSAIDs.  I gave her some instructions on some home exercises she can do to help with her low back pain.

## 2019-07-20 NOTE — Telephone Encounter (Signed)
See my chart message

## 2019-07-20 NOTE — Patient Instructions (Signed)

## 2019-07-26 ENCOUNTER — Encounter: Payer: Self-pay | Admitting: Family

## 2019-07-26 MED ORDER — ALLOPURINOL 100 MG PO TABS: 200.0000 mg | ORAL_TABLET | Freq: Every day | ORAL | 1 refills | Status: DC

## 2019-07-26 MED FILL — ALLOPURINOL 100 MG TABS: 100 | 90 days supply | Qty: 180 | Fill #0

## 2019-07-26 NOTE — Addendum Note (Signed)
Addended by: [name] on: 07/26/2019 07:08 AM   Modules accepted: Orders

## 2019-08-13 MED FILL — FUROSEMIDE 40 MG TAB: 40 | 90 days supply | Qty: 90 | Fill #1

## 2019-08-13 MED FILL — TRIAMTERENE/HCTZ 37.5/25 CP: 37.5-25 | 90 days supply | Qty: 90 | Fill #1

## 2019-09-06 ENCOUNTER — Other Ambulatory Visit: Payer: Self-pay | Admitting: Family

## 2019-09-06 MED FILL — PANTOPRAZOLE SOD DR 40 MG T: 40 | 90 days supply | Qty: 90 | Fill #0

## 2019-09-07 DIAGNOSIS — H2513 Age-related nuclear cataract, bilateral: Secondary | ICD-10-CM | POA: Diagnosis not present

## 2019-10-03 ENCOUNTER — Encounter: Payer: Self-pay | Admitting: Family

## 2019-10-04 ENCOUNTER — Other Ambulatory Visit (HOSPITAL_BASED_OUTPATIENT_CLINIC_OR_DEPARTMENT_OTHER): Payer: Self-pay | Admitting: Family

## 2019-10-04 DIAGNOSIS — Z1231 Encounter for screening mammogram for malignant neoplasm of breast: Secondary | ICD-10-CM

## 2019-10-04 NOTE — Telephone Encounter (Signed)
Please schedule patient a nurse visit for a pneumovax 23 booster and flu shot.

## 2019-10-06 ENCOUNTER — Ambulatory Visit: Payer: PPO

## 2019-10-06 DIAGNOSIS — H2513 Age-related nuclear cataract, bilateral: Secondary | ICD-10-CM | POA: Diagnosis not present

## 2019-10-06 DIAGNOSIS — H2511 Age-related nuclear cataract, right eye: Secondary | ICD-10-CM | POA: Diagnosis not present

## 2019-10-06 DIAGNOSIS — H52203 Unspecified astigmatism, bilateral: Secondary | ICD-10-CM | POA: Diagnosis not present

## 2019-10-06 DIAGNOSIS — I10 Essential (primary) hypertension: Secondary | ICD-10-CM | POA: Diagnosis not present

## 2019-10-07 ENCOUNTER — Other Ambulatory Visit: Payer: Self-pay | Admitting: Family

## 2019-10-07 ENCOUNTER — Other Ambulatory Visit: Payer: Self-pay

## 2019-10-07 ENCOUNTER — Encounter: Payer: Self-pay | Admitting: Family

## 2019-10-07 ENCOUNTER — Ambulatory Visit (INDEPENDENT_AMBULATORY_CARE_PROVIDER_SITE_OTHER): Payer: PPO | Admitting: *Deleted

## 2019-10-07 DIAGNOSIS — Z23 Encounter for immunization: Secondary | ICD-10-CM

## 2019-10-07 MED FILL — DULOXETINE HCL 60 MG CPEP: 60 | 90 days supply | Qty: 90 | Fill #0

## 2019-10-07 MED FILL — ALLOPURINOL 100 MG TABS: 100 | 90 days supply | Qty: 180 | Fill #0

## 2019-10-07 NOTE — Progress Notes (Signed)
Patient here for pneumovax and flu vaccines.  Pnuemonia vaccine given in right deltoid and flu vaccine given in left deltoid.  Patient tolerated both vaccines well.

## 2019-10-18 ENCOUNTER — Telehealth: Payer: Self-pay

## 2019-10-18 NOTE — Telephone Encounter (Signed)
Copied from [place] [id number]. Topic: General - Other >> Oct 18, 2019 10:25 AM Leward Quan A wrote: Reason for CRM: Patient called to say that she is now on [employer] advantage so she wanted to know from [place] if she still need to come for her [place] on Thursday asking for a call back at Ph# [phone]

## 2019-10-18 NOTE — Telephone Encounter (Signed)
Pt contacted. AWV will be done as phone visit on Thursday as scheduled

## 2019-10-20 NOTE — Progress Notes (Signed)
Virtual Visit via Video Note  I connected with patient on 10/21/19 at 10:00 AM EDT by audio enabled telemedicine application and verified that I am speaking with the correct person using two identifiers.   THIS ENCOUNTER IS A VIRTUAL VISIT DUE TO COVID-19 - PATIENT WAS NOT SEEN IN THE OFFICE. PATIENT HAS CONSENTED TO VIRTUAL VISIT / TELEMEDICINE VISIT   Location of patient: home  Location of provider: office  I discussed the limitations of evaluation and management by telemedicine and the availability of in person appointments. The patient expressed understanding and agreed to proceed.   Subjective:   Jordan [name] is a 68 y.o. female who presents for Medicare Annual (Subsequent) preventive examination.  Still works as Engineer, building services.  Review of Systems:  Home Safety/Smoke Alarms: Feels safe in home. Smoke alarms in place.  Lives alone in 2 story home.    Female:        Mammo- scheduled 10/22/19       Dexa scan- 10/21/18        CCS- due 12/2018    Objective:     Vitals: Unable to assess. This visit is enabled though telemedicine due to Covid 19.   Advanced Directives 10/21/2019 12/01/2018 10/16/2018 01/07/2017 07/28/2016 07/27/2016  Does Patient Have a Medical Advance Directive? Yes No Yes Yes No No  Type of Paramedic of [employer];Living will - [place];Living will - - -  Does patient want to make changes to medical advance directive? No - Patient declined - - No - Patient declined - -  Copy of [place] in Chart? No - copy requested - Yes - - -  Would patient like information on creating a medical advance directive? - No - Patient declined - - No - patient declined information Yes - Educational materials given    Tobacco Social History   Tobacco Use  Smoking Status Current Some Day Smoker  . Packs/day: 0.25  . Types: Cigarettes  . Last attempt to quit: 12/26/2014  . Years since quitting: 4.8   Smokeless Tobacco Never Used     Ready to quit: No Counseling given: No   Clinical Intake: Pain : No/denies pain     Past Medical History:  Diagnosis Date  . Cellulitis and abscess of finger, unspecified 401.9  . Cellulitis and abscess of foot, except toes   . Depressive disorder, not elsewhere classified   . GERD (gastroesophageal reflux disease)   . Gout, unspecified   . Hyperglycemia 05/08/2011  . Hyperlipidemia    borderline- not on meds  . Insomnia   . Leg edema    chronic  . Obesity, unspecified   . Osteoarthritis   . Renal insufficiency, mild 05/08/2011  . Small bowel obstruction [facility])    Past Surgical History:  Procedure Laterality Date  . ABDOMINAL HYSTERECTOMY  2002   infection- no history of cancer  . APPENDECTOMY  2002  . CHOLECYSTECTOMY  1977  . ECTOPIC PREGNANCY SURGERY  1971  . right shoulder rotator cuff repair  06-07-10  . TOTAL HIP ARTHROPLASTY Left 1998   Family History  Problem Relation Age of Onset  . Diabetes Mother   . Cancer Mother        colon/ pancreatic  . Colon cancer Mother 34  . Hypertension Father   . Alcohol abuse Father   . Cirrhosis Father   . Other Sister        Pyeoderma gangrenosis  . Obesity Daughter   .  Diabetes Sister        type 2  . Kidney failure Sister   . Liver disease Sister   . Heart attack Brother   . Cirrhosis Sister        had NASH, died heart failure   Social History   Socioeconomic History  . Marital status: Single    Spouse name: Not on file  . Number of children: 2  . Years of education: Not on file  . Highest education level: Not on file  Occupational History  . Occupation: works prn at [employer]  . Financial resource strain: Not on file  . Food insecurity    Worry: Not on file    Inability: Not on file  . Transportation needs    Medical: Not on file    Non-medical: Not on file  Tobacco Use  . Smoking status: Current Some Day Smoker    Packs/day: 0.25     Types: Cigarettes    Last attempt to quit: 12/26/2014    Years since quitting: 4.8  . Smokeless tobacco: Never Used  Substance and Sexual Activity  . Alcohol use: Yes    Alcohol/week: 0.0 standard drinks    Comment: rare  . Drug use: No  . Sexual activity: Never  Lifestyle  . Physical activity    Days per week: Not on file    Minutes per session: Not on file  . Stress: Not on file  Relationships  . Social Herbalist on phone: Not on file    Gets together: Not on file    Attends religious service: Not on file    Active member of club or organization: Not on file    Attends meetings of clubs or organizations: Not on file    Relationship status: Not on file  Other Topics Concern  . Not on file  Social History Narrative   2 children (daughter and son) both local. 3 grandchildren, one great grandchild   Works at ED front desk   Divorced (married x 30 years)    Outpatient Encounter Medications as of 10/21/2019  Medication Sig  . allopurinol (ZYLOPRIM) 100 MG tablet Take 2 tablets (200 mg total) by mouth daily.  Marland Kitchen aspirin EC 81 MG tablet Take 81 mg by mouth daily.  . Cholecalciferol 25 MCG (1000 UT) capsule Take 1,000 Units by mouth daily.  . DULoxetine (CYMBALTA) 60 MG capsule TAKE 1 CAPSULE BY MOUTH DAILY  . furosemide (LASIX) 40 MG tablet Take 1 tablet (40 mg total) by mouth daily as needed.  . Multiple Vitamin (MULTIVITAMIN WITH MINERALS) TABS tablet Take 1 tablet by mouth daily.  . pantoprazole (PROTONIX) 40 MG tablet TAKE 1 TABLET (40 MG TOTAL) BY MOUTH DAILY.  Marland Kitchen triamterene-hydrochlorothiazide (DYAZIDE) 37.5-25 MG capsule TAKE 1 CAPSULE BY MOUTH ONCE EVERY MORNING  . [DISCONTINUED] famotidine (PEPCID AC) 10 MG chewable tablet Chew 10 mg by mouth daily.     No facility-administered encounter medications on file as of 10/21/2019.     Activities of Daily Living In your present state of health, do you have any difficulty performing the following activities:  10/21/2019 12/01/2018  Hearing? N N  Vision? N N  Difficulty concentrating or making decisions? N N  Walking or climbing stairs? N N  Dressing or bathing? N N  Doing errands, shopping? N N  Preparing Food and eating ? N -  Using the Toilet? N -  In the  past six months, have you accidently leaked urine? N -  Do you have problems with loss of bowel control? N -  Managing your Medications? N -  Managing your Finances? N -  Housekeeping or managing your Housekeeping? N -  Some recent data might be hidden    Patient Care Team: [name], NP as PCP - General    Assessment:   This is a routine wellness examination for Akara. Physical assessment deferred to PCP.  Exercise Activities and Dietary recommendations Current Exercise Habits: The patient does not participate in regular exercise at present, Exercise limited by: None identified Diet (meal preparation, eat out, water intake, caffeinated beverages, dairy products, fruits and vegetables): well balanced    Goals    . Have 3 meals a day    . Increase physical activity    . Reduce portion size       Fall Risk Fall Risk  10/21/2019 10/16/2018 03/04/2018 01/07/2017 11/29/2015  Falls in the past year? 0 No No Yes No  Number falls in past yr: 0 - - 2 or more -  Injury with Fall? 0 - - No -  Risk for fall due to : - - Other (Comment) - -    Depression Screen PHQ 2/9 Scores 10/21/2019 12/11/2018 10/16/2018 09/07/2018  PHQ - 2 Score 0 0 0 0  PHQ- 9 Score - 0 - 0  Exception Documentation - - - -     Cognitive Function Ad8 score reviewed for issues:  Issues making decisions:no  Less interest in hobbies / activities:no  Repeats questions, stories (family complaining):no  Trouble using ordinary gadgets (microwave, computer, phone):no  Forgets the month or year: no  Mismanaging finances: no  Remembering appts:no  Daily problems with thinking and/or memory:no Ad8 score is=0   MMSE - Mini Mental State Exam  01/07/2017  Orientation to time 5  Orientation to Place 5  Registration 3  Attention/ Calculation 5  Recall 2  Language- name 2 objects 2  Language- repeat 1  Language- follow 3 step command 3  Language- read & follow direction 1  Write a sentence 1  Copy design 1  Total score 29        Immunization History  Administered Date(s) Administered  . Fluad Quad(high Dose 65+) 10/07/2019  . Influenza Whole 10/12/2009  . Influenza, High Dose Seasonal PF 09/02/2017, 09/07/2018  . Influenza-Unspecified 09/21/2013, 08/23/2014, 09/22/2016  . Pneumococcal Conjugate-13 09/04/2016  . Pneumococcal Polysaccharide-23 12/23/2013, 10/07/2019  . Td 12/23/2004  . Tdap 08/11/2015  . Zoster 06/19/2011   Screening Tests Health Maintenance  Topic Date Due  . OPHTHALMOLOGY EXAM  08/23/2016  . COLONOSCOPY  01/14/2019  . HEMOGLOBIN A1C  01/20/2020  . MAMMOGRAM  10/21/2020  . TETANUS/TDAP  08/10/2025  . INFLUENZA VACCINE  Completed  . DEXA SCAN  Completed  . Hepatitis C Screening  Completed  . PNA vac Low Risk Adult  Completed  . FOOT EXAM  Discontinued     Plan:   See you next year!  Continue to eat heart healthy diet (full of fruits, vegetables, whole grains, lean protein, water--limit salt, fat, and sugar intake) and increase physical activity as tolerated.  Continue doing brain stimulating activities (puzzles, reading, adult coloring books, staying active) to keep memory sharp.   Bring a copy of your living will and/or healthcare power of attorney to your next office visit.   I have personally reviewed and noted the following in the patient's chart:   . Medical and  social history . Use of alcohol, tobacco or illicit drugs  . Current medications and supplements . Functional ability and status . Nutritional status . Physical activity . Advanced directives . List of other physicians . Hospitalizations, surgeries, and ER visits in previous 12 months . Vitals . Screenings to include  cognitive, depression, and falls . Referrals and appointments  In addition, I have reviewed and discussed with patient certain preventive protocols, quality metrics, and best practice recommendations. A written personalized care plan for preventive services as well as general preventive health recommendations were provided to patient.     Shela Nevin, [place]  10/21/2019

## 2019-10-21 ENCOUNTER — Encounter: Payer: Self-pay | Admitting: *Deleted

## 2019-10-21 ENCOUNTER — Ambulatory Visit (INDEPENDENT_AMBULATORY_CARE_PROVIDER_SITE_OTHER): Payer: PPO | Admitting: *Deleted

## 2019-10-21 ENCOUNTER — Other Ambulatory Visit: Payer: Self-pay

## 2019-10-21 DIAGNOSIS — Z0000 Encounter for general adult medical examination without abnormal findings: Secondary | ICD-10-CM

## 2019-10-21 NOTE — Patient Instructions (Signed)
See you next year!  Continue to eat heart healthy diet (full of fruits, vegetables, whole grains, lean protein, water--limit salt, fat, and sugar intake) and increase physical activity as tolerated.  Continue doing brain stimulating activities (puzzles, reading, adult coloring books, staying active) to keep memory sharp.   Bring a copy of your living will and/or healthcare power of attorney to your next office visit.   [name] , Thank you for taking time to come for your Medicare Wellness Visit. I appreciate your ongoing commitment to your health goals. Please review the following plan we discussed and let me know if I can assist you in the future.   These are the goals we discussed: Goals    . Have 3 meals a day    . Increase physical activity    . Reduce portion size       This is a list of the screening recommended for you and due dates:  Health Maintenance  Topic Date Due  . Eye exam for diabetics  08/23/2016  . Colon Cancer Screening  01/14/2019  . Hemoglobin A1C  01/20/2020  . Mammogram  10/21/2020  . Tetanus Vaccine  08/10/2025  . Flu Shot  Completed  . DEXA scan (bone density measurement)  Completed  .  Hepatitis C: One time screening is recommended by Center for Disease Control  (CDC) for  adults born from [birth year] through 1966.   Completed  . Pneumonia vaccines  Completed  . Complete foot exam   Discontinued    Health Maintenance After Age 72 After age 10, you are at a higher risk for certain long-term diseases and infections as well as injuries from falls. Falls are a major cause of broken bones and head injuries in people who are older than age 67. Getting regular preventive care can help to keep you healthy and well. Preventive care includes getting regular testing and making lifestyle changes as recommended by your health care provider. Talk with your health care provider about:  Which screenings and tests you should have. A screening is a test that checks for a  disease when you have no symptoms.  A diet and exercise plan that is right for you. What should I know about screenings and tests to prevent falls? Screening and testing are the best ways to find a health problem early. Early diagnosis and treatment give you the best chance of managing medical conditions that are common after age 49. Certain conditions and lifestyle choices may make you more likely to have a fall. Your health care provider may recommend:  Regular vision checks. Poor vision and conditions such as cataracts can make you more likely to have a fall. If you wear glasses, make sure to get your prescription updated if your vision changes.  Medicine review. Work with your health care provider to regularly review all of the medicines you are taking, including over-the-counter medicines. Ask your health care provider about any side effects that may make you more likely to have a fall. Tell your health care provider if any medicines that you take make you feel dizzy or sleepy.  Osteoporosis screening. Osteoporosis is a condition that causes the bones to get weaker. This can make the bones weak and cause them to break more easily.  Blood pressure screening. Blood pressure changes and medicines to control blood pressure can make you feel dizzy.  Strength and balance checks. Your health care provider may recommend certain tests to check your strength and balance while standing,  walking, or changing positions.  Foot health exam. Foot pain and numbness, as well as not wearing proper footwear, can make you more likely to have a fall.  Depression screening. You may be more likely to have a fall if you have a fear of falling, feel emotionally low, or feel unable to do activities that you used to do.  Alcohol use screening. Using too much alcohol can affect your balance and may make you more likely to have a fall. What actions can I take to lower my risk of falls? General instructions  Talk with  your health care provider about your risks for falling. Tell your health care provider if: ? You fall. Be sure to tell your health care provider about all falls, even ones that seem minor. ? You feel dizzy, sleepy, or off-balance.  Take over-the-counter and prescription medicines only as told by your health care provider. These include any supplements.  Eat a healthy diet and maintain a healthy weight. A healthy diet includes low-fat dairy products, low-fat (lean) meats, and fiber from whole grains, beans, and lots of fruits and vegetables. Home safety  Remove any tripping hazards, such as rugs, cords, and clutter.  Install safety equipment such as grab bars in bathrooms and safety rails on stairs.  Keep rooms and walkways well-lit. Activity   Follow a regular exercise program to stay fit. This will help you maintain your balance. Ask your health care provider what types of exercise are appropriate for you.  If you need a cane or walker, use it as recommended by your health care provider.  Wear supportive shoes that have nonskid soles. Lifestyle  Do not drink alcohol if your health care provider tells you not to drink.  If you drink alcohol, limit how much you have: ? 0-1 drink a day for women. ? 0-2 drinks a day for men.  Be aware of how much alcohol is in your drink. In the [place] one drink equals one typical bottle of beer (12 oz), one-half glass of wine (5 oz), or one shot of hard liquor (1 oz).  Do not use any products that contain nicotine or tobacco, such as cigarettes and e-cigarettes. If you need help quitting, ask your health care provider. Summary  Having a healthy lifestyle and getting preventive care can help to protect your health and wellness after age 20.  Screening and testing are the best way to find a health problem early and help you avoid having a fall. Early diagnosis and treatment give you the best chance for managing medical conditions that are more common  for people who are older than age 71.  Falls are a major cause of broken bones and head injuries in people who are older than age 1. Take precautions to prevent a fall at home.  Work with your health care provider to learn what changes you can make to improve your health and wellness and to prevent falls. This information is not intended to replace advice given to you by your health care provider. Make sure you discuss any questions you have with your health care provider. Document Released: 10/22/2017 Document Revised: 04/01/2019 Document Reviewed: 10/22/2017 Elsevier Patient Education  2020 [employer].

## 2019-10-22 ENCOUNTER — Other Ambulatory Visit: Payer: Self-pay

## 2019-10-22 ENCOUNTER — Ambulatory Visit (HOSPITAL_BASED_OUTPATIENT_CLINIC_OR_DEPARTMENT_OTHER)
Admission: RE | Admit: 2019-10-22 | Discharge: 2019-10-22 | Disposition: A | Discharge status: Home / Self Care | Payer: PPO | Source: Ambulatory Visit | Attending: Family | Admitting: Family

## 2019-10-22 DIAGNOSIS — Z1231 Encounter for screening mammogram for malignant neoplasm of breast: Secondary | ICD-10-CM | POA: Insufficient documentation

## 2019-10-26 MED FILL — DUREZOL 0.05% EYE DROPS: 0.05 | 30 days supply | Qty: 5 | Fill #0

## 2019-10-29 DIAGNOSIS — Z01818 Encounter for other preprocedural examination: Secondary | ICD-10-CM | POA: Diagnosis not present

## 2019-11-01 HISTORY — PX: OTHER SURGICAL HISTORY: SHX169

## 2019-11-02 DIAGNOSIS — K219 Gastro-esophageal reflux disease without esophagitis: Secondary | ICD-10-CM | POA: Diagnosis not present

## 2019-11-02 DIAGNOSIS — Z6841 Body Mass Index (BMI) 40.0 and over, adult: Secondary | ICD-10-CM | POA: Diagnosis not present

## 2019-11-02 DIAGNOSIS — Z79899 Other long term (current) drug therapy: Secondary | ICD-10-CM | POA: Diagnosis not present

## 2019-11-02 DIAGNOSIS — F17200 Nicotine dependence, unspecified, uncomplicated: Secondary | ICD-10-CM | POA: Diagnosis not present

## 2019-11-02 DIAGNOSIS — I10 Essential (primary) hypertension: Secondary | ICD-10-CM | POA: Diagnosis not present

## 2019-11-02 DIAGNOSIS — H2511 Age-related nuclear cataract, right eye: Secondary | ICD-10-CM | POA: Diagnosis not present

## 2019-11-02 DIAGNOSIS — Z7982 Long term (current) use of aspirin: Secondary | ICD-10-CM | POA: Diagnosis not present

## 2019-11-02 DIAGNOSIS — H52201 Unspecified astigmatism, right eye: Secondary | ICD-10-CM | POA: Diagnosis not present

## 2019-11-03 DIAGNOSIS — H2512 Age-related nuclear cataract, left eye: Secondary | ICD-10-CM | POA: Diagnosis not present

## 2019-11-03 MED FILL — BESIVANCE 0.6% SUSP: 0.6 | 30 days supply | Qty: 5 | Fill #0

## 2019-11-12 ENCOUNTER — Encounter: Payer: Self-pay | Admitting: Family

## 2019-11-12 ENCOUNTER — Ambulatory Visit (INDEPENDENT_AMBULATORY_CARE_PROVIDER_SITE_OTHER): Payer: PPO | Admitting: Family

## 2019-11-12 VITALS — Wt 262.0 lb

## 2019-11-12 DIAGNOSIS — M109 Gout, unspecified: Secondary | ICD-10-CM | POA: Diagnosis not present

## 2019-11-12 DIAGNOSIS — F329 Major depressive disorder, single episode, unspecified: Secondary | ICD-10-CM | POA: Diagnosis not present

## 2019-11-12 DIAGNOSIS — K219 Gastro-esophageal reflux disease without esophagitis: Secondary | ICD-10-CM

## 2019-11-12 DIAGNOSIS — E119 Type 2 diabetes mellitus without complications: Secondary | ICD-10-CM | POA: Diagnosis not present

## 2019-11-12 DIAGNOSIS — F32A Depression, unspecified: Secondary | ICD-10-CM

## 2019-11-12 NOTE — Progress Notes (Signed)
Virtual Visit via Video Note  I connected with Jordan [name] on 11/12/19 at  9:40 AM EST by a video enabled telemedicine application and verified that I am speaking with the correct person using two identifiers.  Location: Patient: home Provider: work   I discussed the limitations of evaluation and management by telemedicine and the availability of in person appointments. The patient expressed understanding and agreed to proceed.  History of Present Illness:  DM2- reports that she is doing well with diet and staying active (has been working a lot.   Wt Readings from Last 3 Encounters:  07/20/19 250 lb 12.8 oz (113.8 kg)  03/15/19 251 lb 9.6 oz (114.1 kg)  12/11/18 243 lb (110.2 kg)    Lab Results  Component Value Date   HGBA1C 6.2 07/20/2019   HGBA1C 6.1 03/15/2019   HGBA1C 6.2 09/16/2018   Lab Results  Component Value Date   MICROALBUR <0.7 06/10/2018   [place] 93 06/10/2018   CREATININE 1.06 07/20/2019   GERD- reports symptoms are well controlled on protonix.    HTN- does not have a bp cuff at home.  BP Readings from Last 3 Encounters:  07/20/19 131/78  03/15/19 126/68  12/11/18 128/76   Gout- on allopurinol. No gout flares.   Depression- reports mood good on cymbalta.  Past Medical History:  Diagnosis Date  . Cellulitis and abscess of finger, unspecified 401.9  . Cellulitis and abscess of foot, except toes   . Depressive disorder, not elsewhere classified   . GERD (gastroesophageal reflux disease)   . Gout, unspecified   . Hyperglycemia 05/08/2011  . Hyperlipidemia    borderline- not on meds  . Insomnia   . Leg edema    chronic  . Obesity, unspecified   . Osteoarthritis   . Renal insufficiency, mild 05/08/2011  . Small bowel obstruction (HCC)      Social History   Socioeconomic History  . Marital status: Single    Spouse name: Not on file  . Number of children: 2  . Years of education: Not on file  . Highest education level: Not on file   Occupational History  . Occupation: works prn at [employer]  . Financial resource strain: Not on file  . Food insecurity    Worry: Not on file    Inability: Not on file  . Transportation needs    Medical: Not on file    Non-medical: Not on file  Tobacco Use  . Smoking status: Current Some Day Smoker    Packs/day: 0.25    Types: Cigarettes    Last attempt to quit: 12/26/2014    Years since quitting: 4.8  . Smokeless tobacco: Never Used  Substance and Sexual Activity  . Alcohol use: Yes    Alcohol/week: 0.0 standard drinks    Comment: rare  . Drug use: No  . Sexual activity: Never  Lifestyle  . Physical activity    Days per week: Not on file    Minutes per session: Not on file  . Stress: Not on file  Relationships  . Social Herbalist on phone: Not on file    Gets together: Not on file    Attends religious service: Not on file    Active member of club or organization: Not on file    Attends meetings of clubs or organizations: Not on file    Relationship status: Not on file  . Intimate partner  violence    Fear of current or ex partner: Not on file    Emotionally abused: Not on file    Physically abused: Not on file    Forced sexual activity: Not on file  Other Topics Concern  . Not on file  Social History Narrative   2 children (daughter and son) both local. 3 grandchildren, one great grandchild   Works at ED front desk   Divorced (married x 30 years)    Past Surgical History:  Procedure Laterality Date  . ABDOMINAL HYSTERECTOMY  2002   infection- no history of cancer  . APPENDECTOMY  2002  . catacact Right 11/01/2019  . CHOLECYSTECTOMY  1977  . ECTOPIC PREGNANCY SURGERY  1971  . right shoulder rotator cuff repair  06-07-10  . TOTAL HIP ARTHROPLASTY Left 1998    Family History  Problem Relation Age of Onset  . Diabetes Mother   . Cancer Mother        colon/ pancreatic  . Colon cancer Mother 71  . Hypertension  Father   . Alcohol abuse Father   . Cirrhosis Father   . Other Sister        Pyeoderma gangrenosis  . Obesity Daughter   . Diabetes Sister        type 2  . Kidney failure Sister   . Liver disease Sister   . Heart attack Brother   . Cirrhosis Sister        had NASH, died heart failure    No Known Allergies  Current Outpatient Medications on File Prior to Visit  Medication Sig Dispense Refill  . allopurinol (ZYLOPRIM) 100 MG tablet Take 2 tablets (200 mg total) by mouth daily. 180 tablet 1  . aspirin EC 81 MG tablet Take 81 mg by mouth daily.    . Cholecalciferol 25 MCG (1000 UT) capsule Take 1,000 Units by mouth daily.    . DULoxetine (CYMBALTA) 60 MG capsule TAKE 1 CAPSULE BY MOUTH DAILY 90 capsule 1  . furosemide (LASIX) 40 MG tablet Take 1 tablet (40 mg total) by mouth daily as needed. 90 tablet 1  . Multiple Vitamin (MULTIVITAMIN WITH MINERALS) TABS tablet Take 1 tablet by mouth daily.    . pantoprazole (PROTONIX) 40 MG tablet TAKE 1 TABLET (40 MG TOTAL) BY MOUTH DAILY. 90 tablet 1  . triamterene-hydrochlorothiazide (DYAZIDE) 37.5-25 MG capsule TAKE 1 CAPSULE BY MOUTH ONCE EVERY MORNING 90 capsule 1  . [DISCONTINUED] famotidine (PEPCID AC) 10 MG chewable tablet Chew 10 mg by mouth daily.       No current facility-administered medications on file prior to visit.     Wt 262 lb (118.8 kg)   BMI 44.97 kg/m    Observations/Objective:   Gen: Awake, alert, no acute distress Resp: Breathing is even and non-labored Psych: calm/pleasant demeanor Neuro: Alert and Oriented x 3, + facial symmetry, speech is clear.   Assessment and Plan:  HTN- will have it checked at work and then send via [employer].   DM2- clinically stable. obtain A1C.  Gout- stable on allopurinol.  Depression- stable on cymbalta.  GERD- stable on PPI, continue same.   Follow Up Instructions:    I discussed the assessment and treatment plan with the patient. The patient was provided an opportunity to  ask questions and all were answered. The patient agreed with the plan and demonstrated an understanding of the instructions.   The patient was advised to call back or seek an in-person evaluation if the symptoms  worsen or if the condition fails to improve as anticipated.  Nance Pear, NP

## 2019-11-13 DIAGNOSIS — Z01818 Encounter for other preprocedural examination: Secondary | ICD-10-CM | POA: Diagnosis not present

## 2019-11-16 ENCOUNTER — Ambulatory Visit: Payer: PPO | Admitting: Family

## 2019-11-16 DIAGNOSIS — F17210 Nicotine dependence, cigarettes, uncomplicated: Secondary | ICD-10-CM | POA: Diagnosis not present

## 2019-11-16 DIAGNOSIS — F329 Major depressive disorder, single episode, unspecified: Secondary | ICD-10-CM | POA: Diagnosis not present

## 2019-11-16 DIAGNOSIS — Z6841 Body Mass Index (BMI) 40.0 and over, adult: Secondary | ICD-10-CM | POA: Diagnosis not present

## 2019-11-16 DIAGNOSIS — K219 Gastro-esophageal reflux disease without esophagitis: Secondary | ICD-10-CM | POA: Diagnosis not present

## 2019-11-16 DIAGNOSIS — Z79899 Other long term (current) drug therapy: Secondary | ICD-10-CM | POA: Diagnosis not present

## 2019-11-16 DIAGNOSIS — Z7982 Long term (current) use of aspirin: Secondary | ICD-10-CM | POA: Diagnosis not present

## 2019-11-16 DIAGNOSIS — H2512 Age-related nuclear cataract, left eye: Secondary | ICD-10-CM | POA: Diagnosis not present

## 2019-11-16 DIAGNOSIS — M1990 Unspecified osteoarthritis, unspecified site: Secondary | ICD-10-CM | POA: Diagnosis not present

## 2019-11-21 ENCOUNTER — Encounter: Payer: Self-pay | Admitting: Family

## 2019-11-22 ENCOUNTER — Other Ambulatory Visit: Payer: Self-pay | Admitting: Family

## 2019-11-22 MED FILL — TRIAMTERENE/HCTZ 37.5/25 CP: 37.5-25 | 90 days supply | Qty: 90 | Fill #0

## 2019-11-23 DIAGNOSIS — H25811 Combined forms of age-related cataract, right eye: Secondary | ICD-10-CM | POA: Diagnosis not present

## 2019-12-27 MED FILL — PANTOPRAZOLE SOD DR 40 MG T: 40 | 90 days supply | Qty: 90 | Fill #1

## 2020-01-17 MED FILL — ALLOPURINOL 100 MG TABS: 100 | 90 days supply | Qty: 180 | Fill #1

## 2020-01-17 MED FILL — DULOXETINE HCL 60 MG CPEP: 60 | 90 days supply | Qty: 90 | Fill #1

## 2020-02-09 ENCOUNTER — Telehealth: Payer: Self-pay | Admitting: Family

## 2020-02-09 NOTE — Progress Notes (Signed)
  Chronic Care Management   Outreach Note  02/09/2020 Name: ALEXXIA NELMS MRN: [id number] DOB: [birth year]  Referred by: [name], NP Reason for referral : No chief complaint on file.   An unsuccessful telephone outreach was attempted today. The patient was referred to the pharmacist for assistance with care management and care coordination.   Follow Up Plan:   Raynicia Dukes [employer] Scheduler

## 2020-02-15 ENCOUNTER — Telehealth: Payer: Self-pay | Admitting: Family

## 2020-02-15 NOTE — Chronic Care Management (AMB) (Signed)
  Chronic Care Management   Note  02/15/2020 Name: MOUNA YAGER MRN: [id number] DOB: 1951-06-11  MILLI WOOLRIDGE is a 69 y.o. year old female who is a primary care patient of [name], NP. I reached out to [name] by phone today in response to a referral sent by Ms. Fountain PCP, [name], NP.   Ms. Kindel was given information about Chronic Care Management services today including:  1. CCM service includes personalized support from designated clinical staff supervised by her physician, including individualized plan of care and coordination with other care providers 2. 24/7 contact phone numbers for assistance for urgent and routine care needs. 3. Service will only be billed when office clinical staff spend 20 minutes or more in a month to coordinate care. 4. Only one practitioner may furnish and bill the service in a calendar month. 5. The patient may stop CCM services at any time (effective at the end of the month) by phone call to the office staff. 6. The patient will be responsible for cost sharing (co-pay) of up to 20% of the service fee (after annual deductible is met).  Patient agreed to services and verbal consent obtained.   Follow up plan:   Raynicia Dukes [employer] Scheduler

## 2020-02-15 NOTE — Progress Notes (Signed)
  Chronic Care Management   Outreach Note  02/15/2020 Name: MARTICA OTTS MRN: [id number] DOB: 1951/01/28  Referred by: [name], NP Reason for referral : No chief complaint on file.   A second unsuccessful telephone outreach was attempted today. The patient was referred to pharmacist for assistance with care management and care coordination.  Follow Up Plan:   Raynicia Dukes [employer] Scheduler

## 2020-02-23 ENCOUNTER — Ambulatory Visit: Payer: PPO | Admitting: Pharmacist

## 2020-02-23 ENCOUNTER — Other Ambulatory Visit: Payer: Self-pay

## 2020-02-23 DIAGNOSIS — I10 Essential (primary) hypertension: Secondary | ICD-10-CM

## 2020-02-23 DIAGNOSIS — R739 Hyperglycemia, unspecified: Secondary | ICD-10-CM

## 2020-02-23 DIAGNOSIS — F329 Major depressive disorder, single episode, unspecified: Secondary | ICD-10-CM

## 2020-02-23 DIAGNOSIS — Z720 Tobacco use: Secondary | ICD-10-CM

## 2020-02-23 DIAGNOSIS — M1990 Unspecified osteoarthritis, unspecified site: Secondary | ICD-10-CM

## 2020-02-23 DIAGNOSIS — K219 Gastro-esophageal reflux disease without esophagitis: Secondary | ICD-10-CM

## 2020-02-23 DIAGNOSIS — F32A Depression, unspecified: Secondary | ICD-10-CM

## 2020-02-23 DIAGNOSIS — E785 Hyperlipidemia, unspecified: Secondary | ICD-10-CM

## 2020-02-23 DIAGNOSIS — M109 Gout, unspecified: Secondary | ICD-10-CM

## 2020-02-23 NOTE — Chronic Care Management (AMB) (Signed)
Chronic Care Management Pharmacy  Name: Jordan [name]  MRN: [id number] DOB: [birth year]  Chief Complaint/ HPI  Jordan [name],  69 y.o. , female presents for their Initial CCM visit with the clinical pharmacist via telephone due to COVID-19 Pandemic.  PCP : Jordan Alar, NP  Their chronic conditions include: Tobacco Use, Pre-DM, HTN, HLD, Gerd, Gout, Depression, Osteoarthritis, Obesity  Office Visits: 11/12/19: Visit w/ Jordan Alar, NP - Pt stable. No medication changes noted. Pt to get labs drawn.  10/21/19: Medicare Annual Wellness Exam w/ Jordan Plummer, RN - Goals to have 3 meals a Jordan [name], increase physical activity, and reduce portion size  Consult Visit: 11/01/19: Bilateral cataract procedure w/ Dr. Nancy [name]  Medications: Outpatient Encounter Medications as of 02/23/2020  Medication Sig  . allopurinol (ZYLOPRIM) 100 MG tablet Take 2 tablets (200 mg total) by mouth daily.  Marland Kitchen aspirin EC 81 MG tablet Take 81 mg by mouth daily.  . Cholecalciferol 25 MCG (1000 UT) capsule Take 1,000 Units by mouth daily.  . DULoxetine (CYMBALTA) 60 MG capsule TAKE 1 CAPSULE BY MOUTH DAILY  . furosemide (LASIX) 40 MG tablet Take 1 tablet (40 mg total) by mouth daily as needed.  . Multiple Vitamin (MULTIVITAMIN WITH MINERALS) TABS tablet Take 1 tablet by mouth daily.  . pantoprazole (PROTONIX) 40 MG tablet TAKE 1 TABLET (40 MG TOTAL) BY MOUTH DAILY.  Marland Kitchen triamterene-hydrochlorothiazide (DYAZIDE) 37.5-25 MG capsule TAKE 1 CAPSULE BY MOUTH ONCE EVERY MORNING  . [DISCONTINUED] famotidine (PEPCID AC) 10 MG chewable tablet Chew 10 mg by mouth daily.     No facility-administered encounter medications on file as of 02/23/2020.     Current Diagnosis/Assessment:  Goals Addressed            This Visit's Progress   . Check blood pressure 2-3 times per week and/or when feeling dizzy      . Continue practicing moderation with carbohydrates      . Continue thinking about stopping smoking      .  Discuss medication options for depression      . Pharmacy Care Plan       Current Barriers:  . Chronic Disease Management support, education, and care coordination needs related to Tobacco Use, Pre-DM, HTN, HLD, Gerd, Gout, Depression, Osteoarthritis, Obesity  Pharmacist Clinical Goal(s):  . Blood pressure goal <140/90 . A1c <6.5% . Contemplation about smoking cessation . Management of mental health  Interventions: . Comprehensive medication review performed. . Practice moderation with carbohydrates . Purchase blood pressure cuff . Check blood pressure 2-3 times per week and/or when feeling dizzy  Patient Self Care Activities:  . Patient verbalizes understanding of plan to follow as described above, Self administers medications as prescribed, Calls pharmacy for medication refills, and Calls provider office for new concerns or questions  Initial goal documentation     . Purchase blood pressure cuff        Social Hx.  Has 2 shitzu dogs (Jordan [name], Jordan [name]) Born in [place]. Raised in [place]. Divorced for 20 years. 2 kids (Son-Jordan [name] Daughter - Jordan [name]) 3 grandchildren 3 great-grandchildren. 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   She does not like being at home. Likes to stay busy. Likes to work outside.   Daughter had Covid in December  Tobacco   Eosinophil count:   Lab Results  Component Value Date/Time   EOSPCT 1.6 12/11/2018 11:16 AM  %  Eos (Absolute):  Lab Results  Component Value Date/Time   EOSABS 0.1 12/11/2018 11:16 AM    Tobacco Status:  Social History   Tobacco Use  Smoking Status Current Some Jordan [name] Smoker  . Packs/Jordan [name]: 0.25  . Types: Cigarettes  . Last attempt to quit: 12/26/2014  . Years since quitting: 5.1  Smokeless Tobacco Never Used    Patient has failed these meds in past: bupropion (felt irritable) Patient is currently uncontrolled on the following medications: None  "Smoking is my crutch to handle  something that's bugging me" Hardest cigarette to stop: 1st one in the morning with coffee Smokes within 1st 30 mins of waking up Doesn't smoke in home Has never tried nicotine replacement. Doesn't feel like she would need nicotine replacement since she doesn't smoke a significant amount Has multiple rounds of bronchitis  Has tried bupropion x2 but does not like side effects  Triggers: Stress, driving, boredom  We discussed:  smoking cessation, not interested in cessation at current moment  Plan -Continue contemplating smoking cessation  Pre-Diabetes   Recent Relevant Labs: Lab Results  Component Value Date/Time   HGBA1C 6.2 07/20/2019 11:10 AM   HGBA1C 6.1 03/15/2019 09:27 AM   MICROALBUR <0.7 06/10/2018 10:43 AM    Patient is currently controlled on the following medications: None  Has a family hx of diabetes  We discussed: diet and exercise extensively  Plan -Continue control with diet and exercise    Hypertension   CMP Latest Ref Rng & Units 07/20/2019 03/15/2019 12/11/2018  Glucose 70 - 99 mg/dL 107(H) 94 104(H)  BUN 6 - 23 mg/dL 18 26(H) 9  Creatinine 0.40 - 1.20 mg/dL 1.06 0.93 0.88  Sodium 135 - 145 mEq/L 139 138 142  Potassium 3.5 - 5.1 mEq/L 4.0 4.4 3.9  Chloride 96 - 112 mEq/L 102 104 101  CO2 19 - 32 mEq/L 28 25 31   Calcium 8.4 - 10.5 mg/dL 9.7 9.7 9.7  Total Protein 6.0 - 8.3 g/dL 6.8 - -  Total Bilirubin 0.2 - 1.2 mg/dL 0.6 - -  Alkaline Phos 39 - 117 U/L 104 - -  AST 0 - 37 U/L 27 - -  ALT 0 - 35 U/L 32 - -  GFR      51.43   59.87   67.87  BP today is:  Unable to assess due to phone visit  Office blood pressures are  BP Readings from Last 3 Encounters:  07/20/19 131/78  03/15/19 126/68  12/11/18 128/76    Patient has failed these meds in the past: lisinopril   Patient is currently controlled on the following medications: furosemide 40mg  PRN  Furosemide: Uses about 2-3 times per week due to ankle swelling Felt dizzy and lightheaded at work.  Wondered if it was her BP, but she forgot that she hadn't eaten all Jordan [name].   Gets dizzy spell when she lays down or gets up periodically. Struggled with inner ear in the past.  Patient checks BP at home Never. Does not have BP cuff  We discussed Purchasing blood pressure cuff (arm > wrist for accuracy).  Plan -Purchase blood pressure cuff -Check blood pressure 2-3 times per week and/or when feeling dizzy -Continue current medications     Hyperlipidemia   Lipid Panel     Component Value Date/Time   CHOL 190 07/20/2019 1110   TRIG 220.0 (H) 07/20/2019 1110   HDL 50.10 07/20/2019 1110   CHOLHDL 4 07/20/2019 1110   VLDL 44.0 (H) 07/20/2019 1110  [place] 93 06/10/2018 1043   LDLDIRECT 111.0 07/20/2019 1110     ASCVD 10-year risk: 19.3%  Patient has failed these meds in past: None noted Patient is currently uncontrolled on the following medications: None  Patient not interested in cholesterol medication  We discussed:  importance of reducing risk of heart attack and stroke  Plan -Continue control with diet and exercise  GERD    Patient has failed these meds in past: None noted Patient is currently controlled on the following medications: pantoprazole 40mg  daily  Breakthrough Sx: once per month (usually happens when she forgets to take med) Breakthrough Tx: pantoprazole Triggers: spicy food  We discussed:  avoiding triggers  Plan -Continue current medications   Gout    Patient has failed these meds in past: None noted Patient is currently controlled on the following medications: allopurinol 100mg  2 tabs daily  Plan -Continue current medications   Depression    Patient has failed these meds in past: bupropion (pt reports she stopped smoking, but was irritable) prozac (did not care about anything while taking) zoloft (ok, worked well, but pt stopped)  Patient is currently stable, wants to discuss possible titration or addition on the following medications:  duloxetine 60mg  daily  Her sister Santiago Glad died around 2013-03-26 and she had a really hard time. That is when she restarted depression tx with cymbalta.  Struggles with depression in the winter and especially with COVID.  "Still feel human" while taking cymbalta   We discussed:  medication options. Pt would like to wait to make change.  Plan -Continue current medications   Future Plan -Could consider increasing duloxetine noting labeling states "Although doses >60 mg/Laurenashley Viar did not confer additional benefit in clinical trials, based upon limited data, individual patients may benefit from dose escalation ([place] 2020; Karlton Lemon 03/26/2006)."  -Could consider switching from duloxetine to sertraline since patient had a good response to this medication in the past. Could taper down dose of duloxetine and then start sertraline or directly switch.    Osteoarthritis    Patient has failed these meds in past: None noted Patient is currently controlled on the following medications: aleve OTC #2 PRN , ibuprofen 200mg  #4 PRN  Hurts more when she sits in one place for an extended amount of time Uses aleve or ibuprofen about every other Rukaya Kleinschmidt Does not do well with pain medication.  Dilaudid made her feel like she was leaving her body. Reports her BP bottomed out.  Did well with tramadol Morphine - N/V  Plan -Continue lifestyle modifications and current medications making sure to limit NSAID use  Obesity   Wt: 266lbs most recently  Patient has failed these meds in past: None noted Patient is currently uncontrolled on the following medications: None  Phentermine: feels like it helped somewhat, but not as much as with other people No longer following with weight loss clinic.   Tried Keto diet and lost 14 lbs. Was limiting to 20g of carbs or less, then upped it to 25mg  due to low energy.  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 She will refuse to take it if recommended

## 2020-02-29 NOTE — Patient Instructions (Signed)
Visit Information  Goals Addressed            This Visit's Progress   . Check blood pressure 2-3 times per week and/or when feeling dizzy      . Continue practicing moderation with carbohydrates      . Continue thinking about stopping smoking      . Discuss medication options for depression      . Pharmacy Care Plan       Current Barriers:  . Chronic Disease Management support, education, and care coordination needs related to Tobacco Use, Pre-DM, HTN, HLD, Gerd, Gout, Depression, Osteoarthritis, Obesity  Pharmacist Clinical Goal(s):  . Blood pressure goal <140/90 . A1c <6.5% . Contemplation about smoking cessation . Management of mental health  Interventions: . Comprehensive medication review performed. . Practice moderation with carbohydrates . Purchase blood pressure cuff . Check blood pressure 2-3 times per week and/or when feeling dizzy  Patient Self Care Activities:  . Patient verbalizes understanding of plan to follow as described above, Self administers medications as prescribed, Calls pharmacy for medication refills, and Calls provider office for new concerns or questions  Initial goal documentation     . Purchase blood pressure cuff         Jordan [name] was given information about Chronic Care Management services today including:  1. CCM service includes personalized support from designated clinical staff supervised by her physician, including individualized plan of care and coordination with other care providers 2. 24/7 contact phone numbers for assistance for urgent and routine care needs. 3. Standard insurance, coinsurance, copays and deductibles apply for chronic care management only during months in which we provide at least 20 minutes of these services. Most insurances cover these services at 100%, however patients may be responsible for any copay, coinsurance and/or deductible if applicable. This service may help you avoid the need for more expensive face-to-face  services. 4. Only one practitioner may furnish and bill the service in a calendar month. 5. The patient may stop CCM services at any time (effective at the end of the month) by phone call to the office staff.  Patient agreed to services and verbal consent obtained.   The patient verbalized understanding of instructions provided today and agreed to receive a mailed copy of patient instruction and/or educational materials. The pharmacy team will reach out to the patient again over the next 30 days.   De Blanch, PharmD Clinical Pharmacist [facility] [phone]   Blood Pressure Record Sheet To take your blood pressure, you will need a blood pressure machine. You can buy a blood pressure machine (blood pressure monitor) at your clinic, drug store, or online. When choosing one, consider:  An automatic monitor that has an arm cuff.  A cuff that wraps snugly around your upper arm. You should be able to fit only one finger between your arm and the cuff.  A device that stores blood pressure reading results.  Do not choose a monitor that measures your blood pressure from your wrist or finger. Follow your health care provider's instructions for how to take your blood pressure. To use this form:  Get one reading in the morning (a.m.) before you take any medicines.  Get one reading in the evening (p.m.) before supper.  Take at least 2 readings with each blood pressure check. This makes sure the results are correct. Wait 1-2 minutes between measurements.  Write down the results in the spaces on this form.  Repeat  this once a week, or as told by your health care provider.  Make a follow-up appointment with your health care provider to discuss the results. Blood pressure log Date: _______________________  a.m. _____________________(1st reading) _____________________(2nd reading)  p.m. _____________________(1st reading) _____________________(2nd  reading) Date: _______________________  a.m. _____________________(1st reading) _____________________(2nd reading)  p.m. _____________________(1st reading) _____________________(2nd reading) Date: _______________________  a.m. _____________________(1st reading) _____________________(2nd reading)  p.m. _____________________(1st reading) _____________________(2nd reading) Date: _______________________  a.m. _____________________(1st reading) _____________________(2nd reading)  p.m. _____________________(1st reading) _____________________(2nd reading) Date: _______________________  a.m. _____________________(1st reading) _____________________(2nd reading)  p.m. _____________________(1st reading) _____________________(2nd reading) This information is not intended to replace advice given to you by your health care provider. Make sure you discuss any questions you have with your health care provider. Document Revised: 02/06/2018 Document Reviewed: 12/09/2017 Elsevier Patient Education  [place].   How to Take Your Blood Pressure You can take your blood pressure at home with a machine. You may need to check your blood pressure at home:  To check if you have high blood pressure (hypertension).  To check your blood pressure over time.  To make sure your blood pressure medicine is working. Supplies needed: You will need a blood pressure machine, or monitor. You can buy one at a drugstore or online. When choosing one:  Choose one with an arm cuff.  Choose one that wraps around your upper arm. Only one finger should fit between your arm and the cuff.  Do not choose one that measures your blood pressure from your wrist or finger. Your doctor can suggest a monitor. How to prepare Avoid these things for 30 minutes before checking your blood pressure:  Drinking caffeine.  Drinking alcohol.  Eating.  Smoking.  Exercising. Five minutes before checking your blood  pressure:  Pee.  Sit in a dining chair. Avoid sitting in a soft couch or armchair.  Be quiet. Do not talk. How to take your blood pressure Follow the instructions that came with your machine. If you have a digital blood pressure monitor, these may be the instructions: 1. Sit up straight. 2. Place your feet on the floor. Do not cross your ankles or legs. 3. Rest your left arm at the level of your heart. You may rest it on a table, desk, or chair. 4. Pull up your shirt sleeve. 5. Wrap the blood pressure cuff around the upper part of your left arm. The cuff should be 1 inch (2.5 cm) above your elbow. It is best to wrap the cuff around bare skin. 6. Fit the cuff snugly around your arm. You should be able to place only one finger between the cuff and your arm. 7. Put the cord inside the groove of your elbow. 8. Press the power button. 9. Sit quietly while the cuff fills with air and loses air. 10. Write down the numbers on the screen. 11. Wait 2-3 minutes and then repeat steps 1-10. What do the numbers mean? Two numbers make up your blood pressure. The first number is called systolic pressure. The second is called diastolic pressure. An example of a blood pressure reading is "120 over 80" (or 120/80). If you are an adult and do not have a medical condition, use this guide to find out if your blood pressure is normal: Normal  First number: below 120.  Second number: below 80. Elevated  First number: 120-129.  Second number: below 80. Hypertension stage 1  First number: 130-139.  Second number: 80-89. Hypertension stage 2  First number:  140 or above.  Second number: 84 or above. Your blood pressure is above normal even if only the top or bottom number is above normal. Follow these instructions at home:  Check your blood pressure as often as your doctor tells you to.  Take your monitor to your next doctor's appointment. Your doctor will: ? Make sure you are using it  correctly. ? Make sure it is working right.  Make sure you understand what your blood pressure numbers should be.  Tell your doctor if your medicines are causing side effects. Contact a doctor if:  Your blood pressure keeps being high. Get help right away if:  Your first blood pressure number is higher than 180.  Your second blood pressure number is higher than 120. This information is not intended to replace advice given to you by your health care provider. Make sure you discuss any questions you have with your health care provider. Document Revised: 11/21/2017 Document Reviewed: 05/17/2016 Elsevier Patient Education  2020 [place].   Mindfulness-Based Stress Reduction Mindfulness-based stress reduction (MBSR) is a program that helps people learn to practice mindfulness. Mindfulness is the practice of intentionally paying attention to the present moment. It can be learned and practiced through techniques such as education, breathing exercises, meditation, and yoga. MBSR includes several mindfulness techniques in one program. MBSR works best when you understand the treatment, are willing to try new things, and can commit to spending time practicing what you learn. MBSR training may include learning about:  How your emotions, thoughts, and reactions affect your body.  New ways to respond to things that cause negative thoughts to start (triggers).  How to notice your thoughts and let go of them.  Practicing awareness of everyday things that you normally do without thinking.  The techniques and goals of different types of meditation. What are the benefits of MBSR? MBSR can have many benefits, which include helping you to:  Develop self-awareness. This refers to knowing and understanding yourself.  Learn skills and attitudes that help you to participate in your own health care.  Learn new ways to care for yourself.  Be more accepting about how things are, and let things  go.  Be less judgmental and approach things with an open mind.  Be patient with yourself and trust yourself more. MBSR has also been shown to:  Reduce negative emotions, such as depression and anxiety.  Improve memory and focus.  Change how you sense and approach pain.  Boost your body's ability to fight infections.  Help you connect better with other people.  Improve your sense of well-being. Follow these instructions at home:   Find a local in-person or online MBSR program.  Set aside some time regularly for mindfulness practice.  Find a mindfulness practice that works best for you. This may include one or more of the following: ? Meditation. Meditation involves focusing your mind on a certain thought or activity. ? Breathing awareness exercises. These help you to stay present by focusing on your breath. ? Body scan. For this practice, you lie down and pay attention to each part of your body from head to toe. You can identify tension and soreness and intentionally relax parts of your body. ? Yoga. Yoga involves stretching and breathing, and it can improve your ability to move and be flexible. It can also provide an experience of testing your body's limits, which can help you release stress. ? Mindful eating. This way of eating involves focusing on the taste,  texture, color, and smell of each bite of food. Because this slows down eating and helps you feel full sooner, it can be an important part of a weight-loss plan.  Find a podcast or recording that provides guidance for breathing awareness, body scan, or meditation exercises. You can listen to these any time when you have a free moment to rest without distractions.  Follow your treatment plan as told by your health care provider. This may include taking regular medicines and making changes to your diet or lifestyle as recommended. How to practice mindfulness To do a basic awareness exercise:  Find a comfortable place to  sit.  Pay attention to the present moment. Observe your thoughts, feelings, and surroundings just as they are.  Avoid placing judgment on yourself, your feelings, or your surroundings. Make note of any judgment that comes up, and let it go.  Your mind may wander, and that is okay. Make note of when your thoughts drift, and return your attention to the present moment. To do basic mindfulness meditation:  Find a comfortable place to sit. This may include a stable chair or a firm floor cushion. ? Sit upright with your back straight. Let your arms fall next to your side with your hands resting on your legs. ? If sitting in a chair, rest your feet flat on the floor. ? If sitting on a cushion, cross your legs in front of you.  Keep your head in a neutral position with your chin dropped slightly. Relax your jaw and rest the tip of your tongue on the roof of your mouth. Drop your gaze to the floor. You can close your eyes if you like.  Breathe normally and pay attention to your breath. Feel the air moving in and out of your nose. Feel your belly expanding and relaxing with each breath.  Your mind may wander, and that is okay. Make note of when your thoughts drift, and return your attention to your breath.  Avoid placing judgment on yourself, your feelings, or your surroundings. Make note of any judgment or feelings that come up, let them go, and bring your attention back to your breath.  When you are ready, lift your gaze or open your eyes. Pay attention to how your body feels after the meditation. Where to find more information You can find more information about MBSR from:  Your health care provider.  Community-based meditation centers or programs.  Programs offered near you. Summary  Mindfulness-based stress reduction (MBSR) is a program that teaches you how to intentionally pay attention to the present moment. It is used with other treatments to help you cope better with daily stress,  emotions, and pain.  MBSR focuses on developing self-awareness, which allows you to respond to life stress without judgment or negative emotions.  MBSR programs may involve learning different mindfulness practices, such as breathing exercises, meditation, yoga, body scan, or mindful eating. Find a mindfulness practice that works best for you, and set aside time for it on a regular basis. This information is not intended to replace advice given to you by your health care provider. Make sure you discuss any questions you have with your health care provider. Document Revised: 11/21/2017 Document Reviewed: 04/17/2017 Elsevier Patient Education  [place].

## 2020-03-06 ENCOUNTER — Other Ambulatory Visit: Payer: Self-pay | Admitting: Family

## 2020-03-06 MED FILL — FUROSEMIDE 40 MG TAB: 40 | 90 days supply | Qty: 90 | Fill #0

## 2020-03-06 MED FILL — TRIAMTERENE/HCTZ 37.5/25 CP: 37.5-25 | 90 days supply | Qty: 90 | Fill #1

## 2020-03-22 ENCOUNTER — Other Ambulatory Visit: Payer: Self-pay | Admitting: Family

## 2020-03-22 MED FILL — PANTOPRAZOLE SOD DR 40 MG T: 40 | 90 days supply | Qty: 90 | Fill #0

## 2020-04-24 ENCOUNTER — Other Ambulatory Visit: Payer: Self-pay | Admitting: Family

## 2020-04-24 MED FILL — DULoxetine HCL 60 MG CPEP: 60 | 90 days supply | Qty: 90 | Fill #0

## 2020-04-24 MED FILL — ALLOPURINOL 100 MG TABS: 100 | 90 days supply | Qty: 180 | Fill #0

## 2020-06-09 ENCOUNTER — Telehealth (INDEPENDENT_AMBULATORY_CARE_PROVIDER_SITE_OTHER): Payer: PPO | Admitting: Family

## 2020-06-09 ENCOUNTER — Other Ambulatory Visit: Payer: Self-pay

## 2020-06-09 ENCOUNTER — Encounter: Payer: Self-pay | Admitting: Family

## 2020-06-09 VITALS — BP 144/86 | HR 81 | Wt 257.0 lb

## 2020-06-09 DIAGNOSIS — H01002 Unspecified blepharitis right lower eyelid: Secondary | ICD-10-CM | POA: Diagnosis not present

## 2020-06-09 DIAGNOSIS — H00012 Hordeolum externum right lower eyelid: Secondary | ICD-10-CM | POA: Diagnosis not present

## 2020-06-09 MED ORDER — AZITHROMYCIN 250 MG PO TABS: ORAL_TABLET | ORAL | 0 refills | Status: DC

## 2020-06-09 MED FILL — FUROSEMIDE 40 MG TAB: 40 | 90 days supply | Qty: 90 | Fill #1

## 2020-06-09 MED FILL — AZITHROMYCIN 250 MG TABLET: 250 | 5 days supply | Qty: 6 | Fill #0

## 2020-06-09 NOTE — Progress Notes (Signed)
Virtual Visit via Video Note  I connected with Jordan [name] on 06/09/20 at  7:40 AM EDT by a video enabled telemedicine application and verified that I am speaking with the correct person using two identifiers.  Location: Patient: home Provider: work   I discussed the limitations of evaluation and management by telemedicine and the availability of in person appointments. The patient expressed understanding and agreed to proceed. Only the patient and myself were present for today's video call.   History of Present Illness:  Patient is a 69 year old female who presents today to discuss stye.  She reports that she initially developed a stye on her right lower eyelid approximately 2 weeks ago.She has been applying warm compresses, ice pack.  Symptoms became "worse and worse and worse." She sent a photo to her eye doctor (optometrist).  He told her that it looks like the stye was infected and recommended that she see ophthalmology.  She try to get in with ophthalmology but the only appointment she had available was when she was watching her great-grandchildren.  As result she called [place] today.  She reports that the stye remains painful and she has some tenderness surrounding the eye.  Yesterday she noted that the light was bothering her right eye.  She denies any changes in vision.  She noted that a few days back she had some streaking lateral to the right eye down the cheek.  She reports resolution of the streaking.  Observations/Objective:   Gen: Awake, alert, no acute distress Eyes: + stye noted on right lower lash line.  Upper and lower eyelids are pink and mildly swollen. Resp: Breathing is even and non-labored Psych: calm/pleasant demeanor Neuro: Alert and Oriented x 3, + facial symmetry, speech is clear.   Assessment and Plan:  Blepharitis/stye-we will plan treatment with oral azithromycin.  The patient is advised to call if symptoms worsen or if not improved by Monday.  She is also  advised to go the emergency department should she develop changes in the vision of her right eye.  Follow Up Instructions:    I discussed the assessment and treatment plan with the patient. The patient was provided an opportunity to ask questions and all were answered. The patient agreed with the plan and demonstrated an understanding of the instructions.   The patient was advised to call back or seek an in-person evaluation if the symptoms worsen or if the condition fails to improve as anticipated.  Nance Pear, NP

## 2020-06-12 ENCOUNTER — Other Ambulatory Visit: Payer: Self-pay | Admitting: Family

## 2020-06-12 ENCOUNTER — Encounter: Payer: Self-pay | Admitting: Family

## 2020-06-12 MED ORDER — NEOMYCIN-POLYMYXIN-HC 3.5-10000-1 OP SUSP: 3.0000 [drp] | Freq: Three times a day (TID) | OPHTHALMIC | 0 refills | Status: AC

## 2020-06-13 ENCOUNTER — Other Ambulatory Visit: Payer: Self-pay | Admitting: Family

## 2020-06-13 MED FILL — TRIAMTERENE/HCTZ 37.5/25 CP: 37.5-25 | 90 days supply | Qty: 90 | Fill #0

## 2020-06-13 MED FILL — NEOMYCIN/POLY/HC EYE DROPS: 3.5-10000-1 | 17 days supply | Qty: 8 | Fill #0

## 2020-07-10 MED FILL — PANTOPRAZOLE SOD DR 40 MG T: 40 | 90 days supply | Qty: 90 | Fill #1

## 2020-07-18 ENCOUNTER — Other Ambulatory Visit: Payer: Self-pay

## 2020-07-24 MED FILL — DULOXETINE HCL 60 MG CPEP: 60 | 90 days supply | Qty: 90 | Fill #1

## 2020-07-24 MED FILL — ALLOPURINOL 100 MG TABS: 100 | 90 days supply | Qty: 180 | Fill #1

## 2020-08-30 ENCOUNTER — Telehealth: Payer: PPO

## 2020-08-30 NOTE — Chronic Care Management (AMB) (Deleted)
Chronic Care Management Pharmacy  Name: Jordan [name]  MRN: [id number] DOB: Apr 13, 1951  Chief Complaint/ HPI  Jordan [name],  69 y.o. , female presents for their Follow-Up CCM visit with the clinical pharmacist via telephone due to COVID-19 Pandemic.  PCP : [name], NP  Their chronic conditions include: Tobacco Use, Pre-DM, HTN, HLD, Gerd, Gout, Depression, Osteoarthritis, Obesity  Office Visits: 06/09/20: Visit w/ [name], NP - Stye on R eye. Prescribed azithromycin.  Consult Visit: None since last CCM visit on 02/23/20.   Medications: Outpatient Encounter Medications as of 08/30/2020  Medication Sig  . allopurinol (ZYLOPRIM) 100 MG tablet TAKE 2 TABLETS BY MOUTH DAILY  . aspirin EC 81 MG tablet Take 81 mg by mouth daily.  Marland Kitchen azithromycin (ZITHROMAX) 250 MG tablet Take 2 tabs by mouth today, then 1 tab once daily for 4 more days  . Cholecalciferol 25 MCG (1000 UT) capsule Take 1,000 Units by mouth daily.  . DULoxetine (CYMBALTA) 60 MG capsule TAKE 1 CAPSULE BY MOUTH DAILY  . furosemide (LASIX) 40 MG tablet TAKE 1 TABLET (40 MG TOTAL) BY MOUTH DAILY AS NEEDED.  . Multiple Vitamin (MULTIVITAMIN WITH MINERALS) TABS tablet Take 1 tablet by mouth daily.  . pantoprazole (PROTONIX) 40 MG tablet TAKE 1 TABLET BY MOUTH DAILY.  Marland Kitchen triamterene-hydrochlorothiazide (DYAZIDE) 37.5-25 MG capsule TAKE 1 CAPSULE BY MOUTH ONCE EVERY MORNING  . [DISCONTINUED] famotidine (PEPCID AC) 10 MG chewable tablet Chew 10 mg by mouth daily.     No facility-administered encounter medications on file as of 08/30/2020.     Current Diagnosis/Assessment:  Goals Addressed   None    Social Hx.  Has 2 shitzu dogs ([place], [place]) Born in [place]. Raised in [place]. Divorced for 20 years. 2 kids (Son-Kelly Daughter - Amy) 3 grandchildren 3 great-grandchildren. 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   She does not like being at home. Likes to stay busy. Likes to work  outside.   Daughter had Covid in December  Tobacco   Eosinophil count:   Lab Results  Component Value Date/Time   EOSPCT 1.6 12/11/2018 11:16 AM  %                               Eos (Absolute):  Lab Results  Component Value Date/Time   EOSABS 0.1 12/11/2018 11:16 AM    Tobacco Status:  Social History   Tobacco Use  Smoking Status Current Some Obrian Bulson Smoker  . Packs/Alita Waldren: 0.25  . Types: Cigarettes  . Last attempt to quit: 12/26/2014  . Years since quitting: 5.6  Smokeless Tobacco Never Used    Patient has failed these meds in past: bupropion (felt irritable) Patient is currently uncontrolled on the following medications: None  "Smoking is my crutch to handle something that's bugging me" Hardest cigarette to stop: 1st one in the morning with coffee Smokes within 1st 30 mins of waking up Doesn't smoke in home Has never tried nicotine replacement. Doesn't feel like she would need nicotine replacement since she doesn't smoke a significant amount Has multiple rounds of bronchitis  Has tried bupropion x2 but does not like side effects  Triggers: Stress, driving, boredom  We discussed:  smoking cessation, not interested in cessation at current moment  Update 08/30/20  Plan -Continue contemplating smoking cessation  Pre-Diabetes   Recent Relevant Labs: Lab Results  Component Value Date/Time   HGBA1C 6.2 07/20/2019 11:10  AM   HGBA1C 6.1 03/15/2019 09:27 AM   MICROALBUR <0.7 06/10/2018 10:43 AM    Patient is currently controlled on the following medications: None  Has a family hx of diabetes  We discussed: diet and exercise extensively   Update 08/30/20 A1c stable. Congratulated pt on this.  Plan -Continue control with diet and exercise    Hypertension   Goal <140/90  CMP Latest Ref Rng & Units 07/20/2019 03/15/2019 12/11/2018  Glucose 70 - 99 mg/dL 107(H) 94 104(H)  BUN 6 - 23 mg/dL 18 26(H) 9  Creatinine 0.40 - 1.20 mg/dL 1.06 0.93 0.88  Sodium 135 - 145 mEq/L  139 138 142  Potassium 3.5 - 5.1 mEq/L 4.0 4.4 3.9  Chloride 96 - 112 mEq/L 102 104 101  CO2 19 - 32 mEq/L _0 Calcium 8.4 - 10.5 mg/dL 9.7 9.7 9.7  Total Protein 6.0 - 8.3 g/dL 6.8 - -  Total Bilirubin 0.2 - 1.2 mg/dL 0.6 - -  Alkaline Phos 39 - 117 U/L 104 - -  AST 0 - 37 U/L 27 - -  ALT 0 - 35 U/L 32 - -   Kidney Function Lab Results  Component Value Date/Time   CREATININE 1.06 07/20/2019 11:10 AM   CREATININE 0.93 03/15/2019 09:27 AM   CREATININE 1.08 04/25/2014 11:04 AM   CREATININE 1.04 10/25/2013 10:42 AM   GFR 51.43 (L) 07/20/2019 11:10 AM   GFRNONAA >60 12/06/2018 05:45 AM   GFRNONAA 57 (L) 10/25/2013 10:42 AM   GFRAA >60 12/06/2018 05:45 AM   GFRAA 66 10/25/2013 10:42 AM   K 4.0 07/20/2019 11:10 AM   K 4.4 03/15/2019 09:27 AM   Office blood pressures are  BP Readings from Last 3 Encounters:  06/09/20 (!) 144/86  07/20/19 131/78  03/15/19 126/68    Patient has failed these meds in the past: lisinopril   Patient is currently controlled on the following medications:   fFurosemide 44m PRN  Furosemide: Uses about 2-3 times per week due to ankle swelling Felt dizzy and lightheaded at work. Wondered if it was her BP, but she forgot that she hadn't eaten all Geovanni Rahming.   Gets dizzy spell when she lays down or gets up periodically. Struggled with inner ear in the past.  Patient checks BP at home Never. Does not have BP cuff  We discussed Purchasing blood pressure cuff (arm > wrist for accuracy).   Update 08/30/20  Plan -Purchase blood pressure cuff -Check blood pressure 2-3 times per week and/or when feeling dizzy -Continue current medications     Hyperlipidemia   LDL goal < 100  Lipid Panel     Component Value Date/Time   CHOL 190 07/20/2019 1110   TRIG 220.0 (H) 07/20/2019 1110   HDL 50.10 07/20/2019 1110   LDLCALC 93 06/10/2018 1043   LDLDIRECT 111.0 07/20/2019 1110    Hepatic Function Latest Ref Rng & Units 07/20/2019 12/01/2018 07/27/2016  Total  Protein 6.0 - 8.3 g/dL 6.8 7.2 8.0  Albumin 3.5 - 5.2 g/dL 4.6 4.5 4.8  AST 0 - 37 U/L _1 ALT 0 - 35 U/L 32 38 36  Alk Phosphatase 39 - 117 U/L 104 102 113  Total Bilirubin 0.2 - 1.2 mg/dL 0.6 0.6 0.9  Bilirubin, Direct 0.0 - 0.3 mg/dL - - -     The 10-year ASCVD risk score (Mikey BussingDC Jr., et al., 2013) is: 39.5%   Values used to calculate the score:  Age: 87 years     Sex: Female     Is Non-Hispanic African American: No     Diabetic: Yes     Tobacco smoker: Yes     Systolic Blood Pressure: 166 mmHg     Is BP treated: Yes     HDL Cholesterol: 50.1 mg/dL     Total Cholesterol: 190 mg/dL   Patient has failed these meds in past: None noted  Patient is currently uncontrolled on the following medications:  . None  Patient not interested in cholesterol medications  We discussed:  {[facility] discussion:[phone]}  Plan -Consider updated lipid panel -Continue control with diet and exercise    Depression    Patient has failed these meds in past: bupropion (pt reports she stopped smoking, but was irritable) prozac (did not care about anything while taking) zoloft (ok, worked well, but pt stopped)  Patient is currently stable, wants to discuss possible titration or addition on the following medications:   Duloxetine 43m daily  Her sister KSantiago Gladdied around [phone]-Feb-2014and she had a really hard time. That is when she restarted depression tx with cymbalta.  Struggles with depression in the winter and especially with COVID.  "Still feel human" while taking cymbalta   We discussed:  medication options. Pt would like to wait to make change.   Update 08/30/20  Plan -Continue current medications   Future Plan -Could consider increasing duloxetine noting labeling states "Although doses >60 mg/Salle Brandle did not confer additional benefit in clinical trials, based upon limited data, individual patients may benefit from dose escalation (Nelson 2020; SKarlton [id number]."    -Could consider switching from duloxetine to sertraline since patient had a good response to this medication in the past. Could taper down dose of duloxetine and then start sertraline or directly switch.    Osteoarthritis    Patient has failed these meds in past: None noted Patient is currently controlled on the following medications: aleve OTC #2 PRN , ibuprofen 2059m#4 PRN  Hurts more when she sits in one place for an extended amount of time Uses aleve or ibuprofen about every other Cayleigh Paull Does not do well with pain medication.  Dilaudid made her feel like she was leaving her body. Reports her BP bottomed out.  Did well with tramadol Morphine - N/V  Plan -Continue lifestyle modifications and current medications making sure to limit NSAID use  Obesity   Wt: 266lbs most recently  Patient has failed these meds in past: None noted Patient is currently uncontrolled on the following medications: None  Phentermine: feels like it helped somewhat, but not as much as with other people No longer following with weight loss clinic.   Tried Keto diet and lost 14 lbs. Was limiting to 20g of carbs or less, then upped it to 2525mue to low energy.  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 She will refuse to take it if recommended

## 2020-08-31 ENCOUNTER — Other Ambulatory Visit: Payer: Self-pay

## 2020-08-31 ENCOUNTER — Telehealth: Payer: Self-pay | Admitting: Family

## 2020-08-31 ENCOUNTER — Ambulatory Visit: Payer: PPO | Admitting: Pharmacist

## 2020-08-31 DIAGNOSIS — E785 Hyperlipidemia, unspecified: Secondary | ICD-10-CM

## 2020-08-31 DIAGNOSIS — I10 Essential (primary) hypertension: Secondary | ICD-10-CM

## 2020-08-31 DIAGNOSIS — R739 Hyperglycemia, unspecified: Secondary | ICD-10-CM

## 2020-08-31 NOTE — Patient Instructions (Signed)
Visit Information  Goals Addressed            This Visit's Progress   . Chronic Care Management Pharmacy Care Plan       CARE PLAN ENTRY (see longitudinal plan of care for additional care plan information)  Current Barriers:  . Chronic Disease Management support, education, and care coordination needs related to Tobacco Use, Pre-DM, HTN, HLD, Gerd, Gout, Depression, Osteoarthritis, Obesity   Hypertension BP Readings from Last 3 Encounters:  06/09/20 (!) 144/86  07/20/19 131/78  03/15/19 126/68   . Pharmacist Clinical Goal(s): o Over the next 180 days, patient will work with PharmD and providers to maintain BP goal <140/90 . Current regimen:  o Furosemide 40mg  PRN . Patient self care activities - Over the next 180 days, patient will: o Maintain hypertension medication regimen.   Hyperlipidemia Lab Results  Component Value Date/Time   LDLCALC 93 06/10/2018 10:43 AM   LDLDIRECT 111.0 07/20/2019 11:10 AM   . Pharmacist Clinical Goal(s): o Over the next 180 days, patient will work with PharmD and providers to achieve LDL goal < 100 . Current regimen:  o Diet and exercise management   . Interventions: o Discussed diet and exercise . Patient self care activities - Over the next 180 days, patient will: o Reduce cholesterol containing foods  Pre-Diabetes Lab Results  Component Value Date/Time   HGBA1C 6.2 07/20/2019 11:10 AM   HGBA1C 6.1 03/15/2019 09:27 AM   . Pharmacist Clinical Goal(s): o Over the next 180 days, patient will work with PharmD and providers to maintain A1c goal <6.5% . Current regimen:  o Diet and exercise management   . Interventions: o Discussed diet and exercise . Patient self care activities - Over the next 180 days, patient will: o Maintain a1c <6.5%  Tobacco Use Disorder . Pharmacist Clinical Goal(s) o Over the next 180 days, patient will work with PharmD and providers to reduce the amount of cigarettes consumed . Current regimen:   o None . Interventions: o Discussed smoking cessation. Patient is in the pre-contemplation stage . Patient self care activities - Over the next 180 days, patient will: o Work to reduce the number of cigarettes consumed  Medication management . Pharmacist Clinical Goal(s): o Over the next 180 days, patient will work with PharmD and providers to maintain optimal medication adherence . Current pharmacy: [facility] . Interventions o Comprehensive medication review performed. o Continue current medication management strategy . Patient self care activities - Over the next 180 days, patient will: o Focus on medication adherence by filling and taking medications appropriately  o Take medications as prescribed o Report any questions or concerns to PharmD and/or provider(s)  Please see past updates related to this goal by clicking on the "Past Updates" button in the selected goal        The patient verbalized understanding of instructions provided today and agreed to receive a mailed copy of patient instruction and/or educational materials.  Telephone follow up appointment with pharmacy team member scheduled for: 02/28/2021  Melvenia Beam Kensli Bowley, PharmD Clinical Pharmacist [facility] [phone]   Cholesterol Content in Foods Cholesterol is a waxy, fat-like substance that helps to carry fat in the blood. The body needs cholesterol in small amounts, but too much cholesterol can cause damage to the arteries and heart. Most people should eat less than 200 milligrams (mg) of cholesterol a Jerron Niblack. Foods with cholesterol  Cholesterol is found in animal-based foods, such as meat,  seafood, and dairy. Generally, low-fat dairy and lean meats have less cholesterol than full-fat dairy and fatty meats. The milligrams of cholesterol per serving (mg per serving) of common cholesterol-containing foods are listed below. Meat and other proteins  Egg -- one large  whole egg has 186 mg.  Veal shank -- 4 oz has 141 mg.  Lean ground [place] (93% lean) -- 4 oz has 118 mg.  Fat-trimmed lamb loin -- 4 oz has 106 mg.  Lean ground beef (90% lean) -- 4 oz has 100 mg.  Lobster -- 3.5 oz has 90 mg.  Pork loin chops -- 4 oz has 86 mg.  Canned salmon -- 3.5 oz has 83 mg.  Fat-trimmed beef top loin -- 4 oz has 78 mg.  Frankfurter -- 1 frank (3.5 oz) has 77 mg.  Crab -- 3.5 oz has 71 mg.  Roasted chicken without skin, white meat -- 4 oz has 66 mg.  Light bologna -- 2 oz has 45 mg.  Deli-cut [place] -- 2 oz has 31 mg.  Canned tuna -- 3.5 oz has 31 mg.  Berniece Salines -- 1 oz has 29 mg.  Oysters and mussels (raw) -- 3.5 oz has 25 mg.  Mackerel -- 1 oz has 22 mg.  Trout -- 1 oz has 20 mg.  Pork sausage -- 1 link (1 oz) has 17 mg.  Salmon -- 1 oz has 16 mg.  Tilapia -- 1 oz has 14 mg. Dairy  Soft-serve ice cream --  cup (4 oz) has 103 mg.  Whole-milk yogurt -- 1 cup (8 oz) has 29 mg.  Cheddar cheese -- 1 oz has 28 mg.  American cheese -- 1 oz has 28 mg.  Whole milk -- 1 cup (8 oz) has 23 mg.  2% milk -- 1 cup (8 oz) has 18 mg.  Cream cheese -- 1 tablespoon (Tbsp) has 15 mg.  Cottage cheese --  cup (4 oz) has 14 mg.  Low-fat (1%) milk -- 1 cup (8 oz) has 10 mg.  Sour cream -- 1 Tbsp has 8.5 mg.  Low-fat yogurt -- 1 cup (8 oz) has 8 mg.  Nonfat Greek yogurt -- 1 cup (8 oz) has 7 mg.  Half-and-half cream -- 1 Tbsp has 5 mg. Fats and oils  Cod liver oil -- 1 tablespoon (Tbsp) has 82 mg.  Butter -- 1 Tbsp has 15 mg.  Lard -- 1 Tbsp has 14 mg.  Bacon grease -- 1 Tbsp has 14 mg.  Mayonnaise -- 1 Tbsp has 5-10 mg.  Margarine -- 1 Tbsp has 3-10 mg. Exact amounts of cholesterol in these foods may vary depending on specific ingredients and brands. Foods without cholesterol Most plant-based foods do not have cholesterol unless you combine them with a food that has cholesterol. Foods without cholesterol include:  Grains and  cereals.  Vegetables.  Fruits.  Vegetable oils, such as olive, canola, and sunflower oil.  Legumes, such as peas, beans, and lentils.  Nuts and seeds.  Egg whites. Summary  The body needs cholesterol in small amounts, but too much cholesterol can cause damage to the arteries and heart.  Most people should eat less than 200 milligrams (mg) of cholesterol a Chinwe Lope. This information is not intended to replace advice given to you by your health care provider. Make sure you discuss any questions you have with your health care provider. Document Revised: 11/21/2017 Document Reviewed: 08/05/2017 Elsevier Patient Education  [place].

## 2020-08-31 NOTE — Telephone Encounter (Signed)
See mychart.  

## 2020-08-31 NOTE — Chronic Care Management (AMB) (Signed)
Chronic Care Management Pharmacy  Name: Jordan [name]  MRN: [id number] DOB: July 20, 1951  Chief Complaint/ HPI  Jordan [name],  69 y.o. , female presents for their Follow-Up CCM visit with the clinical pharmacist via telephone due to COVID-19 Pandemic.  PCP : Jordan Alar, NP  Their chronic conditions include: Tobacco Use, Pre-DM, HTN, HLD, Gerd, Gout, Depression, Osteoarthritis, Obesity  Office Visits: 06/09/20: Visit w/ Jordan Alar, NP - Stye on R eye. Prescribed azithromycin.  Consult Visit: None since last CCM visit on 02/23/20.   Medications: Outpatient Encounter Medications as of 08/31/2020  Medication Sig  . allopurinol (ZYLOPRIM) 100 MG tablet TAKE 2 TABLETS BY MOUTH DAILY  . aspirin EC 81 MG tablet Take 81 mg by mouth daily.  Marland Kitchen azithromycin (ZITHROMAX) 250 MG tablet Take 2 tabs by mouth today, then 1 tab once daily for 4 more days  . Cholecalciferol 25 MCG (1000 UT) capsule Take 1,000 Units by mouth daily.  . DULoxetine (CYMBALTA) 60 MG capsule TAKE 1 CAPSULE BY MOUTH DAILY  . furosemide (LASIX) 40 MG tablet TAKE 1 TABLET (40 MG TOTAL) BY MOUTH DAILY AS NEEDED.  . Multiple Vitamin (MULTIVITAMIN WITH MINERALS) TABS tablet Take 1 tablet by mouth daily.  . pantoprazole (PROTONIX) 40 MG tablet TAKE 1 TABLET BY MOUTH DAILY.  Marland Kitchen triamterene-hydrochlorothiazide (DYAZIDE) 37.5-25 MG capsule TAKE 1 CAPSULE BY MOUTH ONCE EVERY MORNING  . [DISCONTINUED] famotidine (PEPCID AC) 10 MG chewable tablet Chew 10 mg by mouth daily.     No facility-administered encounter medications on file as of 08/31/2020.   Jordan [name] Screenings   Alcohol Screen:   . Last Alcohol Screening Score (AUDIT): Not on file  Depression (PHQ2-9): Low Risk   . PHQ-2 Score: 2  Financial Resource Strain:   . Difficulty of Paying Living Expenses: Not on file  Food Insecurity:   . Worried About Charity fundraiser in the Last Year: Not on file  . Ran Out of Food in the Last Year: Not on file  Housing:   .  Last Housing Risk Score: Not on file  Physical Activity:   . Days of Exercise per Week: Not on file  . Minutes of Exercise per Session: Not on file  Social Connections:   . Frequency of Communication with Friends and Family: Not on file  . Frequency of Social Gatherings with Friends and Family: Not on file  . Attends Religious Services: Not on file  . Active Member of Clubs or Organizations: Not on file  . Attends Archivist Meetings: Not on file  . Marital Status: Not on file  Stress:   . Feeling of Stress : Not on file  Tobacco Use: High Risk  . Smoking Tobacco Use: Current Some [name] Smoker  . Smokeless Tobacco Use: Never Used  Transportation Needs:   . Film/video editor (Medical): Not on file  . Lack of Transportation (Non-Medical): Not on file     Current Diagnosis/Assessment:  Goals Addressed            This Visit's Progress   . Chronic Care Management Pharmacy Care Plan       CARE PLAN ENTRY (see longitudinal plan of care for additional care plan information)  Current Barriers:  . Chronic Disease Management support, education, and care coordination needs related to Tobacco Use, Pre-DM, HTN, HLD, Gerd, Gout, Depression, Osteoarthritis, Obesity   Hypertension BP Readings from Last 3 Encounters:  06/09/20 (!) 144/86  07/20/19 131/78  03/15/19 126/68   .  Pharmacist Clinical Goal(s): o Over the next 180 days, patient will work with PharmD and providers to maintain BP goal <140/90 . Current regimen:  o Furosemide 41m PRN . Patient self care activities - Over the next 180 days, patient will: o Maintain hypertension medication regimen.   Hyperlipidemia Lab Results  Component Value Date/Time   LDLCALC 93 06/10/2018 10:43 AM   LDLDIRECT 111.0 07/20/2019 11:10 AM   . Pharmacist Clinical Goal(s): o Over the next 180 days, patient will work with PharmD and providers to achieve LDL goal < 100 . Current regimen:  o Diet and exercise management    . Interventions: o Discussed diet and exercise . Patient self care activities - Over the next 180 days, patient will: o Reduce cholesterol containing foods  Pre-Diabetes Lab Results  Component Value Date/Time   HGBA1C 6.2 07/20/2019 11:10 AM   HGBA1C 6.1 03/15/2019 09:27 AM   . Pharmacist Clinical Goal(s): o Over the next 180 days, patient will work with PharmD and providers to maintain A1c goal <6.5% . Current regimen:  o Diet and exercise management   . Interventions: o Discussed diet and exercise . Patient self care activities - Over the next 180 days, patient will: o Maintain a1c <6.5%  Tobacco Use Disorder . Pharmacist Clinical Goal(s) o Over the next 180 days, patient will work with PharmD and providers to reduce the amount of cigarettes consumed . Current regimen:  o None . Interventions: o Discussed smoking cessation. Patient is in the pre-contemplation stage . Patient self care activities - Over the next 180 days, patient will: o Work to reduce the number of cigarettes consumed  Medication management . Pharmacist Clinical Goal(s): o Over the next 180 days, patient will work with PharmD and providers to maintain optimal medication adherence . Current pharmacy: [facility]. Interventions o Comprehensive medication review performed. o Continue current medication management strategy . Patient self care activities - Over the next 180 days, patient will: o Focus on medication adherence by filling and taking medications appropriately  o Take medications as prescribed o Report any questions or concerns to PharmD and/or provider(s)  Please see past updates related to this goal by clicking on the "Past Updates" button in the selected goal       Social Hx.  Has 2 shitzu dogs ([place] Divorced [place] years. 2 kids (Son-Kelly Daughter - Amy) 3 grandchildren 3 great-grandchildren. 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   She does not like being at home. Likes to stay busy. Likes to work outside.   Daughter had Covid in December  Update 08/31/20 Patient's friend SIvin Bootymoved in with her at the end of March and this has been really good for her.  Tobacco   Eosinophil count:   Lab Results  Component Value Date/Time   EOSPCT 1.6 12/11/2018 11:16 AM  %                               Eos (Absolute):  Lab Results  Component Value Date/Time   EOSABS 0.1 12/11/2018 11:16 AM    Tobacco Status:  Social History   Tobacco Use  Smoking Status Current Some Jordan [name] Smoker  . Packs/Allisha Harter: 0.25  . Types: Cigarettes  . Last attempt to quit: 12/26/2014  . Years since quitting: 5.6  Smokeless Tobacco Never Used    Patient has failed  these meds in past: bupropion (felt irritable) Patient is currently uncontrolled on the following medications: None  "Smoking is my crutch to handle something that's bugging me" Hardest cigarette to stop: 1st one in the morning with coffee Smokes within 1st 30 mins of waking up Doesn't smoke in home Has never tried nicotine replacement. Doesn't feel like she would need nicotine replacement since she doesn't smoke a significant amount Has multiple rounds of bronchitis  Has tried bupropion x2 but does not like side effects  Triggers: Stress, driving, boredom  We discussed:  smoking cessation, not interested in cessation at current moment  Update 08/31/20 Will follow up at next visit.  Plan -Continue contemplating smoking cessation  Pre-Diabetes   Recent Relevant Labs: Lab Results  Component Value Date/Time   HGBA1C 6.2 07/20/2019 11:10 AM   HGBA1C 6.1 03/15/2019 09:27 AM   MICROALBUR <0.7 06/10/2018 10:43 AM    Patient is currently controlled on the following medications: None  Has a family hx of diabetes  We discussed: diet and exercise extensively   Update 08/31/20 A1c stable. Congratulated pt on this.  Plan -Continue control with  diet and exercise    Hypertension   Goal <140/90  CMP Latest Ref Rng & Units 07/20/2019 03/15/2019 12/11/2018  Glucose 70 - 99 mg/dL 107(H) 94 104(H)  BUN 6 - 23 mg/dL 18 26(H) 9  Creatinine 0.40 - 1.20 mg/dL 1.06 0.93 0.88  Sodium 135 - 145 mEq/L 139 138 142  Potassium 3.5 - 5.1 mEq/L 4.0 4.4 3.9  Chloride 96 - 112 mEq/L 102 104 101  CO2 19 - 32 mEq/L _0 Calcium 8.4 - 10.5 mg/dL 9.7 9.7 9.7  Total Protein 6.0 - 8.3 g/dL 6.8 - -  Total Bilirubin 0.2 - 1.2 mg/dL 0.6 - -  Alkaline Phos 39 - 117 U/L 104 - -  AST 0 - 37 U/L 27 - -  ALT 0 - 35 U/L 32 - -   Kidney Function Lab Results  Component Value Date/Time   CREATININE 1.06 07/20/2019 11:10 AM   CREATININE 0.93 03/15/2019 09:27 AM   CREATININE 1.08 04/25/2014 11:04 AM   CREATININE 1.04 10/25/2013 10:42 AM   GFR 51.43 (L) 07/20/2019 11:10 AM   GFRNONAA >60 12/06/2018 05:45 AM   GFRNONAA 57 (L) 10/25/2013 10:42 AM   GFRAA >60 12/06/2018 05:45 AM   GFRAA 66 10/25/2013 10:42 AM   K 4.0 07/20/2019 11:10 AM   K 4.4 03/15/2019 09:27 AM   Office blood pressures are  BP Readings from Last 3 Encounters:  06/09/20 (!) 144/86  07/20/19 131/78  03/15/19 126/68    Patient has failed these meds in the past: lisinopril   Patient is currently controlled on the following medications:   Furosemide 52m PRN  Furosemide: Uses about 2-3 times per week due to ankle swelling Felt dizzy and lightheaded at work. Wondered if it was her BP, but she forgot that she hadn't eaten all Jordan [name].   Gets dizzy spell when she lays down or gets up periodically. Struggled with inner ear in the past.  Patient checks BP at home Never. Does not have BP cuff  We discussed Purchasing blood pressure cuff (arm > wrist for accuracy).   Update 08/31/20 Did purchase BP cuff.  Was checking BP regularly previous to last couple of weeks.  Would get numbers in 130s-140s.  No more dizzy spells. Feels like this resolved when she started plugging her ears when  she went swimming  Plan -Continue  current medications     Hyperlipidemia   LDL goal < 100  Lipid Panel     Component Value Date/Time   CHOL 190 07/20/2019 1110   TRIG 220.0 (H) 07/20/2019 1110   HDL 50.10 07/20/2019 1110   LDLCALC 93 06/10/2018 1043   LDLDIRECT 111.0 07/20/2019 1110    Hepatic Function Latest Ref Rng & Units 07/20/2019 12/01/2018 07/27/2016  Total Protein 6.0 - 8.3 g/dL 6.8 7.2 8.0  Albumin 3.5 - 5.2 g/dL 4.6 4.5 4.8  AST 0 - 37 U/L _0 ALT 0 - 35 U/L 32 38 36  Alk Phosphatase 39 - 117 U/L 104 102 113  Total Bilirubin 0.2 - 1.2 mg/dL 0.6 0.6 0.9  Bilirubin, Direct 0.0 - 0.3 mg/dL - - -     The 10-year ASCVD risk score Jordan Bussing DC Jr., Jordan al., 03-03-12) is: 39.5%   Values used to calculate the score:     Age: 89 years     Sex: Female     Is Non-Hispanic African American: No     Diabetic: Yes     Tobacco smoker: Yes     Systolic Blood Pressure: 056 mmHg     Is BP treated: Yes     HDL Cholesterol: 50.1 mg/dL     Total Cholesterol: 190 mg/dL   Patient has failed these meds in past: None noted  Patient is currently uncontrolled on the following medications:  . None  Patient not interested in cholesterol medications Discussed updated lipid panel and goal of LDL <100.  Uses air fryer vs deep fryer now. Loves eating meat  Plan -Continue control with diet and exercise  Depression    Patient has failed these meds in past: bupropion (pt reports she stopped smoking, but was irritable) prozac (did not care about anything while taking) zoloft (ok, worked well, but pt stopped)  Patient is currently stable, wants to discuss possible titration or addition on the following medications:   Duloxetine 21m daily  Her sister KSantiago Gladdied around 203-12-14and she had a really hard time. That is when she restarted depression tx with cymbalta.  Struggles with depression in the winter and especially with COVID.  "Still feel human" while taking cymbalta  We discussed:   medication options. Pt would like to wait to make change.   Update 08/31/20 PHQ9 = 2 She is feeling so much better now that SIvin Bootyhas moved in with her.  Plan -Continue current medications   Future Plan -Could consider increasing duloxetine noting labeling states "Although doses >60 mg/Jordan [name] did not confer additional benefit in clinical trials, based upon limited data, individual patients may benefit from dose escalation (Nelson 2020; SKarlton Lemon203/12/07."  -Could consider switching from duloxetine to sertraline since patient had a good response to this medication in the past. Could taper down dose of duloxetine and then start sertraline or directly switch.   Obesity   Wt: 266lbs most recently  Patient has failed these meds in past: None noted Patient is currently uncontrolled on the following medications: None  Phentermine: feels like it helped somewhat, but not as much as with other people No longer following with weight loss clinic.   Tried Keto diet and lost 14 lbs. Was limiting to 20g of carbs or less, then upped it to 265mdue to low energy.  Replaces chips with almonds and pork rinds  "I'm a big bread and potatoe person"  We discussed:  Limiting carbs    Update 08/31/20 Diet Not  doing keto, but still watching carbs. 257lbs from 06/09/20. States she is about 260lbs.  Exercise Swims in pool daily for at least 2 hours. Mows her own yard. Considering doing treadmill with her friend, but she is having a sciatica flare.  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 She will refuse to take it if recommended

## 2020-09-14 ENCOUNTER — Other Ambulatory Visit: Payer: Self-pay | Admitting: Family Medicine

## 2020-09-14 MED FILL — TRIAMTERENE/HCTZ 37.5/25 CP: 37.5-25 | 90 days supply | Qty: 90 | Fill #0

## 2020-09-14 NOTE — Telephone Encounter (Signed)
RF request for triamterene-hctz LOV:06/09/20 Next ov: n/a Last written:06/13/20(90,0)  Medication pending. Please advise

## 2020-10-01 ENCOUNTER — Other Ambulatory Visit: Payer: Self-pay

## 2020-10-01 ENCOUNTER — Encounter (HOSPITAL_BASED_OUTPATIENT_CLINIC_OR_DEPARTMENT_OTHER): Payer: Self-pay | Admitting: Emergency Medicine

## 2020-10-01 ENCOUNTER — Emergency Department (HOSPITAL_BASED_OUTPATIENT_CLINIC_OR_DEPARTMENT_OTHER)
Admission: EM | Admit: 2020-10-01 | Discharge: 2020-10-01 | Disposition: A | Discharge status: Home / Self Care | Payer: PPO | Attending: Emergency Medicine | Admitting: Emergency Medicine

## 2020-10-01 ENCOUNTER — Emergency Department (HOSPITAL_BASED_OUTPATIENT_CLINIC_OR_DEPARTMENT_OTHER): Payer: PPO

## 2020-10-01 DIAGNOSIS — I10 Essential (primary) hypertension: Secondary | ICD-10-CM | POA: Insufficient documentation

## 2020-10-01 DIAGNOSIS — Z79899 Other long term (current) drug therapy: Secondary | ICD-10-CM | POA: Insufficient documentation

## 2020-10-01 DIAGNOSIS — F17210 Nicotine dependence, cigarettes, uncomplicated: Secondary | ICD-10-CM | POA: Diagnosis not present

## 2020-10-01 DIAGNOSIS — U071 COVID-19: Secondary | ICD-10-CM | POA: Diagnosis not present

## 2020-10-01 DIAGNOSIS — Z7982 Long term (current) use of aspirin: Secondary | ICD-10-CM | POA: Diagnosis not present

## 2020-10-01 DIAGNOSIS — E1165 Type 2 diabetes mellitus with hyperglycemia: Secondary | ICD-10-CM | POA: Diagnosis not present

## 2020-10-01 DIAGNOSIS — Z96642 Presence of left artificial hip joint: Secondary | ICD-10-CM | POA: Insufficient documentation

## 2020-10-01 DIAGNOSIS — M549 Dorsalgia, unspecified: Secondary | ICD-10-CM | POA: Diagnosis not present

## 2020-10-01 DIAGNOSIS — R0602 Shortness of breath: Secondary | ICD-10-CM | POA: Diagnosis not present

## 2020-10-01 DIAGNOSIS — R110 Nausea: Secondary | ICD-10-CM | POA: Diagnosis present

## 2020-10-01 HISTORY — DX: Essential (primary) hypertension: I10

## 2020-10-01 LAB — CBC WITH DIFFERENTIAL/PLATELET
Abs Immature Granulocytes: 0.02 10*3/uL (ref 0.00–0.07)
Basophils Absolute: 0 10*3/uL (ref 0.0–0.1)
Basophils Relative: 0 %
Eosinophils Absolute: 0 10*3/uL (ref 0.0–0.5)
Eosinophils Relative: 0 %
HCT: 43.2 % (ref 36.0–46.0)
Hemoglobin: 14.8 g/dL (ref 12.0–15.0)
Immature Granulocytes: 0 %
Lymphocytes Relative: 29 %
Lymphs Abs: 1.3 10*3/uL (ref 0.7–4.0)
MCH: 32.1 pg (ref 26.0–34.0)
MCHC: 34.3 g/dL (ref 30.0–36.0)
MCV: 93.7 fL (ref 80.0–100.0)
Monocytes Absolute: 0.3 10*3/uL (ref 0.1–1.0)
Monocytes Relative: 6 %
Neutro Abs: 2.9 10*3/uL (ref 1.7–7.7)
Neutrophils Relative %: 65 %
Platelets: 120 10*3/uL — ABNORMAL LOW (ref 150–400)
RBC: 4.61 MIL/uL (ref 3.87–5.11)
RDW: 13 % (ref 11.5–15.5)
WBC: 4.5 10*3/uL (ref 4.0–10.5)
nRBC: 0 % (ref 0.0–0.2)

## 2020-10-01 LAB — URINALYSIS, ROUTINE W REFLEX MICROSCOPIC
Bilirubin Urine: NEGATIVE
Glucose, UA: NEGATIVE mg/dL
Ketones, ur: NEGATIVE mg/dL
Leukocytes,Ua: NEGATIVE
Nitrite: NEGATIVE
Protein, ur: 30 mg/dL — AB
Specific Gravity, Urine: 1.025 (ref 1.005–1.030)
pH: 5.5 (ref 5.0–8.0)

## 2020-10-01 LAB — COMPREHENSIVE METABOLIC PANEL
ALT: 61 U/L — ABNORMAL HIGH (ref 0–44)
AST: 49 U/L — ABNORMAL HIGH (ref 15–41)
Albumin: 3.6 g/dL (ref 3.5–5.0)
Alkaline Phosphatase: 77 U/L (ref 38–126)
Anion gap: 11 (ref 5–15)
BUN: 18 mg/dL (ref 8–23)
CO2: 21 mmol/L — ABNORMAL LOW (ref 22–32)
Calcium: 8.6 mg/dL — ABNORMAL LOW (ref 8.9–10.3)
Chloride: 105 mmol/L (ref 98–111)
Creatinine, Ser: 0.88 mg/dL (ref 0.44–1.00)
GFR, Estimated: >60 mL/min (ref >60)
Glucose, Bld: 133 mg/dL — ABNORMAL HIGH (ref 70–99)
Potassium: 3.6 mmol/L (ref 3.5–5.1)
Sodium: 137 mmol/L (ref 135–145)
Total Bilirubin: 0.3 mg/dL (ref 0.3–1.2)
Total Protein: 6.2 g/dL — ABNORMAL LOW (ref 6.5–8.1)

## 2020-10-01 LAB — URINALYSIS, MICROSCOPIC (REFLEX)

## 2020-10-01 LAB — LIPASE, BLOOD: Lipase: 33 U/L (ref 11–51)

## 2020-10-01 MED ORDER — KETOROLAC TROMETHAMINE 15 MG/ML IJ SOLN
15.0000 mg | Freq: Once | INTRAMUSCULAR | Status: AC
  Administered 2020-10-01: 15 mg via INTRAVENOUS
  Filled 2020-10-01: qty 1

## 2020-10-01 MED ORDER — ONDANSETRON HCL 4 MG/2ML IJ SOLN
4.0000 mg | Freq: Once | INTRAMUSCULAR | Status: AC
  Administered 2020-10-01: 4 mg via INTRAVENOUS
  Filled 2020-10-01: qty 2

## 2020-10-01 MED ORDER — ACETAMINOPHEN 500 MG PO TABS
1000.0000 mg | ORAL_TABLET | Freq: Once | ORAL | Status: AC
  Administered 2020-10-01: 1000 mg via ORAL
  Filled 2020-10-01: qty 2

## 2020-10-01 MED ORDER — DEXAMETHASONE SODIUM PHOSPHATE 10 MG/ML IJ SOLN
10.0000 mg | Freq: Once | INTRAMUSCULAR | Status: AC
  Administered 2020-10-01: 10 mg via INTRAVENOUS
  Filled 2020-10-01: qty 1

## 2020-10-01 MED ORDER — ALBUTEROL SULFATE HFA 108 (90 BASE) MCG/ACT IN AERS
4.0000 | INHALATION_SPRAY | Freq: Once | RESPIRATORY_TRACT | Status: AC
  Administered 2020-10-01: 4 via RESPIRATORY_TRACT
  Filled 2020-10-01: qty 6.7

## 2020-10-01 NOTE — Discharge Instructions (Signed)
As we discussed here, your work-up is reassuring.  Your chest x-ray did not show any signs of pneumonia.  Make sure you are staying hydrated and keeping plenty fluids down.  As we discussed, you can use Tylenol and ibuprofen for fevers, body aches.  I referred you to the monoclonal antibody infusion clinic.  They will contact you if you are eligible and discuss doing an infusion with you.  Return to emergency department for any difficulty breathing, persistent vomiting or any other worsening concerning symptoms.

## 2020-10-01 NOTE — ED Notes (Signed)
Pt endorses 800mg  ibuprofen at 1600

## 2020-10-01 NOTE — ED Notes (Signed)
Ambulated in room.  O2 sat remained between 92-93%.  Denies feeling SOB.  Tolerated well.

## 2020-10-01 NOTE — ED Provider Notes (Signed)
[facility] EMERGENCY DEPARTMENT Provider Note   CSN: [id number] Arrival date & time: 10/01/20  1714     History Chief Complaint  Patient presents with  . Back Pain    Covid+    Jordan [name] is a 69 y.o. female with past medical history of hypertension, obesity, osteoarthritis that presents the emergency department today for arthralgias and nausea.  Was diagnosed with Covid on Tuesday, has been 5 days.  States that she has been able to be okay however her arthralgias, specifically on her left hip is what brought her into the emergency department today.  Denies any falls or trauma to this area.  Has been taking ibuprofen without much relief.  Able to walk normally.  Denies any dysuria, hematuria.  Denies any fevers at home.  Temperature here today at 102.9. Denies back pain, neck pain, headache, confusion.  Does admit to smoking daily, no history of COPD or asthma or heart failure.  States that she is generally healthy.  Has not vomited.  Does have diarrhea daily for the last five days, no bright red blood per rectum or melena.  Has been vaccinated against Covid, states that her last vaccine which was Pfizer was in August of this year.  Does admit to sick contact at the beach where she went 5 days ago.  Denies any chest pain.  Does admit to some shortness of breath.  States that she has been trying heating pads to her hip which does provide relief, has been constantly burning herself from this. No new cough, no ST, congestion.       HPI     Past Medical History:  Diagnosis Date  . Cellulitis and abscess of finger, unspecified 401.9  . Cellulitis and abscess of foot, except toes   . Depressive disorder, not elsewhere classified   . GERD (gastroesophageal reflux disease)   . Gout, unspecified   . Hyperglycemia 05/08/2011  . Hyperlipidemia    borderline- not on meds  . Hypertension   . Insomnia   . Leg edema    chronic  . Obesity, unspecified   . Osteoarthritis   .  Renal insufficiency, mild 05/08/2011  . Small bowel obstruction [facility])     Patient Active Problem List   Diagnosis Date Noted  . SBO (small bowel obstruction) ([place]) 12/01/2018  . Hypokalemia 12/01/2018  . Tobacco abuse 12/01/2018  . Benign paroxysmal positional vertigo 07/25/2015  . Routine general medical examination at a health care facility 07/06/2012  . GERD (gastroesophageal reflux disease) 01/03/2012  . Hyperglycemia 05/08/2011  . General medical examination 05/06/2011  . Gout 02/21/2010  . Degenerative disc disease, lumbar 07/11/2009  . Osteoarthritis 02/19/2009  . Hyperlipidemia 01/16/2009  . Obesity, Class III, BMI 40-49.9 (morbid obesity) ([place]) 02/17/2008  . Depression 09/04/2007  . Essential hypertension 09/04/2007    Past Surgical History:  Procedure Laterality Date  . ABDOMINAL HYSTERECTOMY  2002   infection- no history of cancer  . APPENDECTOMY  2002  . catacact Right 11/01/2019  . CHOLECYSTECTOMY  1977  . ECTOPIC PREGNANCY SURGERY  1971  . right shoulder rotator cuff repair  06-07-10  . TOTAL HIP ARTHROPLASTY Left 1998     OB History   No obstetric history on file.     Family History  Problem Relation Age of Onset  . Diabetes Mother   . Cancer Mother        colon/ pancreatic  . Colon cancer Mother 49  . Hypertension Father   .  Alcohol abuse Father   . Cirrhosis Father   . Other Sister        Pyeoderma gangrenosis  . Obesity Daughter   . Diabetes Sister        type 2  . Kidney failure Sister   . Liver disease Sister   . Heart attack Brother   . Cirrhosis Sister        had NASH, died heart failure    Social History   Tobacco Use  . Smoking status: Current Some Day Smoker    Packs/day: 0.25    Types: Cigarettes    Last attempt to quit: 12/26/2014    Years since quitting: 5.7  . Smokeless tobacco: Never Used  Vaping Use  . Vaping Use: Never used  Substance Use Topics  . Alcohol use: Yes    Alcohol/week: 0.0 standard drinks    Comment:  rare  . Drug use: No    Home Medications Prior to Admission medications   Medication Sig Start Date End Date Taking? Authorizing Provider  allopurinol (ZYLOPRIM) 100 MG tablet TAKE 2 TABLETS BY MOUTH DAILY 04/24/20   [name], NP  aspirin EC 81 MG tablet Take 81 mg by mouth daily.    [provider]  azithromycin (ZITHROMAX) 250 MG tablet Take 2 tabs by mouth today, then 1 tab once daily for 4 more days 06/09/20   [name], NP  Cholecalciferol 25 MCG (1000 UT) capsule Take 1,000 Units by mouth daily.    [provider]  DULoxetine (CYMBALTA) 60 MG capsule TAKE 1 CAPSULE BY MOUTH DAILY 04/24/20   [name], NP  furosemide (LASIX) 40 MG tablet TAKE 1 TABLET (40 MG TOTAL) BY MOUTH DAILY AS NEEDED. 03/06/20   [name], NP  Multiple Vitamin (MULTIVITAMIN WITH MINERALS) TABS tablet Take 1 tablet by mouth daily.    [provider]  pantoprazole (PROTONIX) 40 MG tablet TAKE 1 TABLET BY MOUTH DAILY. 03/22/20   [name], NP  triamterene-hydrochlorothiazide (DYAZIDE) 37.5-25 MG capsule TAKE 1 CAPSULE BY MOUTH ONCE EVERY MORNING 09/14/20   [name], NP  famotidine (PEPCID AC) 10 MG chewable tablet Chew 10 mg by mouth daily.    03/03/12  [provider]    Allergies    Patient has no known allergies.  Review of Systems   Review of Systems  Constitutional: Negative for chills, diaphoresis, fatigue and fever.  HENT: Negative for congestion, ear discharge, ear pain, facial swelling, postnasal drip, rhinorrhea, sinus pressure, sore throat, tinnitus, trouble swallowing and voice change.   Eyes: Negative for pain and visual disturbance.  Respiratory: Positive for shortness of breath. Negative for cough and wheezing.   Cardiovascular: Negative for chest pain, palpitations and leg swelling.  Gastrointestinal: Positive for nausea. Negative for abdominal distention, abdominal pain, anal bleeding, blood in stool,  diarrhea and vomiting.  Genitourinary: Negative for difficulty urinating, flank pain, frequency, hematuria and pelvic pain.  Musculoskeletal: Positive for arthralgias, gait problem and myalgias. Negative for back pain, joint swelling, neck pain and neck stiffness.  Skin: Negative for pallor.  Neurological: Negative for dizziness, speech difficulty, weakness and headaches.  Psychiatric/Behavioral: Negative for confusion.    Physical Exam Updated Vital Signs BP (!) 162/91 (BP Location: Left Arm)   Pulse 92   Temp (!) 102.9 F (39.4 C) (Oral)   Resp (!) 21   Ht 5\' 4"  (1.626 m)   Wt 117.5 kg   SpO2 96%   BMI 44.46 kg/m   Physical Exam Constitutional:  General: She is not in acute distress.    Appearance: Normal appearance. She is not ill-appearing, toxic-appearing or diaphoretic.  HENT:     Mouth/Throat:     Mouth: Mucous membranes are moist.     Pharynx: Oropharynx is clear.  Eyes:     General: No scleral icterus.    Extraocular Movements: Extraocular movements intact.     Pupils: Pupils are equal, round, and reactive to light.  Cardiovascular:     Rate and Rhythm: Normal rate and regular rhythm.     Pulses: Normal pulses.     Heart sounds: Normal heart sounds.  Pulmonary:     Effort: Pulmonary effort is normal. No respiratory distress.     Breath sounds: No stridor. Wheezing present. No rhonchi or rales.  Chest:     Chest wall: No tenderness.  Abdominal:     General: Abdomen is flat. There is no distension.     Palpations: Abdomen is soft.     Tenderness: There is no abdominal tenderness. There is no guarding or rebound.  Musculoskeletal:        General: No swelling or tenderness. Normal range of motion.     Cervical back: Normal range of motion and neck supple. No rigidity.     Right lower leg: No edema.     Left lower leg: No edema.     Comments: Left hip with tenderness to touch, no warmth or induration.  Does appear as if there is an erythematous area, appears  as if this where pt burned herself from heating pad.  Normal range of motion, normal gait.  Normal strength and sensation to bilateral lower extremity, PT pulses 2+ and equal.  Skin:    General: Skin is warm and dry.     Capillary Refill: Capillary refill takes less than 2 seconds.     Coloration: Skin is not pale.  Neurological:     General: No focal deficit present.     Mental Status: She is alert and oriented to person, place, and time.  Psychiatric:        Mood and Affect: Mood normal.        Behavior: Behavior normal.     ED Results / Procedures / Treatments   Labs (all labs ordered are listed, but only abnormal results are displayed) Labs Reviewed  CBC WITH DIFFERENTIAL/PLATELET - Abnormal; Notable for the following components:      Result Value   Platelets 120 (*)    All other components within normal limits  URINALYSIS, ROUTINE W REFLEX MICROSCOPIC    EKG None  Radiology No results found.  Procedures Procedures (including critical care time)  Medications Ordered in ED Medications  acetaminophen (TYLENOL) tablet 1,000 mg (has no administration in time range)  ondansetron (ZOFRAN) injection 4 mg (has no administration in time range)  dexamethasone (DECADRON) injection 10 mg (has no administration in time range)  albuterol (VENTOLIN HFA) 108 (90 Base) MCG/ACT inhaler 4 puff (has no administration in time range)    ED Course  I have reviewed the triage vital signs and the nursing notes.  Pertinent labs & imaging results that were available during my care of the patient were reviewed by me and considered in my medical decision making (see chart for details).    MDM Rules/Calculators/A&P                          KAMREE WIENS is a 69 y.o. female with  past medical history of hypertension, obesity, osteoarthritis that presents the emergency department today for arthralgias and nausea, COIVD +.  Patient appears well, no acute distress.  Lung exam with diffuse  wheezes, does appear short of breath on exam, however satting at 96% on room air.  Plan to get labs, chest x-ray.  Will give Decadron and albuterol at this time.  We will also give Tylenol and nausea medication at this time and reassess. After treatments will also have nursing walk pt to make sure 02 sat doesn't drop with ambulation, if it doesn't pt to be discharged with PCP follow up.  Would be a candidate for Mab infusion if patient wants due to BMI.  Pt care was handed off to L.LaydenPA-C at shift change.  Complete history and physical and current plan have been communicated.  Please refer to their note for the remainder of ED care and ultimate disposition. Awaiting labs, and medications to be given.  Final Clinical Impression(s) / ED Diagnoses Final diagnoses:  [id number]    Rx / DC Orders ED Discharge Orders    None       Alfredia Client, PA-C 10/01/20 1854    Lucrezia Starch, MD 10/02/20 1241

## 2020-10-01 NOTE — ED Triage Notes (Signed)
Pt arrives pov with c/o left side lower back pain x 3 days. Pt denies injury, denies dysuria, denies fever

## 2020-10-01 NOTE — ED Provider Notes (Signed)
  Care assumed from [facility], PA-C at shift change with labs and CXR pending .  In brief, this patient is a 69 year old female past history of hypertension, obesity, osteoarthritis presents for evaluation arthralgias and nausea.  Patient reports that 5 days ago, she was diagnosed with Covid.  She reports that since then, she has had body aches noted to her back and left hip.  She denies any preceding trauma, injury, fall.  She has not had any chest pain, difficulty breathing.  She does have a history of smoking.  No history of COPD or asthma.  Please see note from previous provider for full history/physical exam.   Physical Exam  BP (!) 162/91 (BP Location: Left Arm)   Pulse 92   Temp (!) 102.9 F (39.4 C) (Oral)   Resp (!) 21   Ht 5\' 4"  (1.626 m)   Wt 117.5 kg   SpO2 92%   BMI 44.46 kg/m   Physical Exam  NAD, no evidence of respiratory distress.   ED Course/Procedures     Procedures  MDM   PLAN: Patient pending lab work, chest x-ray.  Myalgias likely secondary to Covid.  MDM:  Lipase is normal.  CMP shows BUN and creatinine within normal limits.  CBC shows no leukocytosis.  UA shows no evidence of infectious etiology.  This x-ray negative for any acute infectious etiology.  Patient ambulated in the ED while maintaining O2 sats of 92-94.  Patient states feeling better after pain medication here in the ED.  Given her symptom duration, patient was referred to monoclonal antibody infusion for further evaluation.  Encouraged at home supportive care measures. At this time, patient exhibits no emergent life-threatening condition that require further evaluation in ED. Discussed patient with Dr. Roslynn Amble who is agreeable to plan. Patient had ample opportunity for questions and discussion. All patient's questions were answered with full understanding. Strict return precautions discussed. Patient expresses understanding and agreement to plan.    1. COVID-19       Desma Mcgregor 10/01/20 2047    Lucrezia Starch, MD 10/02/20 1244

## 2020-10-01 NOTE — ED Notes (Signed)
Pt trying to get copy of Covid test results from [facility]

## 2020-10-03 ENCOUNTER — Telehealth: Payer: Self-pay | Admitting: Unknown Physician Specialty

## 2020-10-03 ENCOUNTER — Telehealth: Payer: Self-pay | Admitting: Family

## 2020-10-03 NOTE — Telephone Encounter (Signed)
Copied from [place] [phone]. Topic: Medicare AWV >> Oct 03, 2020  1:39 PM Weston Anna wrote: Reason for CRM:  Left message to cancel AWV appt on 10/23/2020. Will call back to reschedule when schedules open -srs

## 2020-10-03 NOTE — Telephone Encounter (Signed)
Called to Discuss with patient about Covid symptoms and the use of the monoclonal antibody infusion for those with mild to moderate Covid symptoms and at a high risk of hospitalization.     Pt appears to qualify for this infusion due to co-morbid conditions and/or a member of an at-risk group in accordance with the FDA Emergency Use Authorization.    Unable to reach pt    

## 2020-10-04 ENCOUNTER — Other Ambulatory Visit: Payer: Self-pay | Admitting: Family

## 2020-10-04 ENCOUNTER — Other Ambulatory Visit: Payer: Self-pay

## 2020-10-04 ENCOUNTER — Telehealth (INDEPENDENT_AMBULATORY_CARE_PROVIDER_SITE_OTHER): Payer: PPO | Admitting: Family

## 2020-10-04 DIAGNOSIS — U071 COVID-19: Secondary | ICD-10-CM | POA: Diagnosis not present

## 2020-10-04 MED ORDER — ONDANSETRON HCL 4 MG PO TABS: 4.0000 mg | ORAL_TABLET | Freq: Three times a day (TID) | ORAL | 0 refills | Status: DC | PRN

## 2020-10-04 MED FILL — ONDANSETRON HCL 4 MG TABLET: 4 | 6 days supply | Qty: 20 | Fill #0

## 2020-10-04 NOTE — Progress Notes (Signed)
dVirtual Visit via Video Note  I connected with Jordan [name] on 10/04/20 at 10:40 AM EDT by a video enabled telemedicine application and verified that I am speaking with the correct person using two identifiers.  Location: Patient: home Provider: work   I discussed the limitations of evaluation and management by telemedicine and the availability of in person appointments. The patient expressed understanding and agreed to proceed. Only the patient and myself were present for today's video call.   History of Present Illness:  Patient is a 69 yr old female who presents today to discuss covid-19.  She started to feel ill on 10/5.  She tested positive on Thursday.  Reports that she had severe joint pain which prompted her to go to the ED on 10/10.  Had temp 102.9 when she was in the ED.  She reports that toradol helped her pain. Monday 10/11 felt better.  Yesterday she woke up with weakness/nausea/dizziness. Notes that she has not eaten in 4 days due to nausea.  She states she is drinking water and occasional diet coke. She denies shortness of breath.  Mild cough.  She reports that she completed the pfizer series.  Last one was August 25th,  August 4th was first.  Sense of smell is impaired. Has some sense of taste still.  Observations/Objective:   Gen: Awake, alert, no acute distress Resp: Breathing is even and non-labored Psych: calm/pleasant demeanor Neuro: Alert and Oriented x 3, + facial symmetry, speech is clear.   Assessment and Plan:   Covid-19 infection- her biggest issue right now is nausea/ and malaise. Will give a trial of zofran.  Recommended referral for monoclonal antibody treatment and she declines.  Encouraged hydration/po intake to help with strength and tylenol as needed for pain of fever.  She is advised to return to the ER if she is unable to keep down PO's or if she develops SOB.   Pt verblizes underst  Follow Up Instructions:    I discussed the assessment and  treatment plan with the patient. The patient was provided an opportunity to ask questions and all were answered. The patient agreed with the plan and demonstrated an understanding of the instructions.   The patient was advised to call back or seek an in-person evaluation if the symptoms worsen or if the condition fails to improve as anticipated.  Nance Pear, NP

## 2020-10-05 ENCOUNTER — Telehealth: Payer: Self-pay | Admitting: Family

## 2020-10-05 NOTE — Telephone Encounter (Signed)
Pt was called per referral from [facility] to make appt w/ [facility]. lvm  

## 2020-10-08 ENCOUNTER — Inpatient Hospital Stay (HOSPITAL_BASED_OUTPATIENT_CLINIC_OR_DEPARTMENT_OTHER)
Admission: EM | Admit: 2020-10-08 | Discharge: 2020-10-13 | DRG: 177 | Disposition: A | Discharge status: Home Health | Payer: PPO | Attending: Internal Medicine | Admitting: Internal Medicine

## 2020-10-08 ENCOUNTER — Emergency Department (HOSPITAL_BASED_OUTPATIENT_CLINIC_OR_DEPARTMENT_OTHER): Payer: PPO

## 2020-10-08 ENCOUNTER — Other Ambulatory Visit: Payer: Self-pay

## 2020-10-08 ENCOUNTER — Encounter (HOSPITAL_BASED_OUTPATIENT_CLINIC_OR_DEPARTMENT_OTHER): Payer: Self-pay | Admitting: Emergency Medicine

## 2020-10-08 DIAGNOSIS — I10 Essential (primary) hypertension: Secondary | ICD-10-CM | POA: Diagnosis present

## 2020-10-08 DIAGNOSIS — U071 COVID-19: Secondary | ICD-10-CM

## 2020-10-08 DIAGNOSIS — Z90710 Acquired absence of both cervix and uterus: Secondary | ICD-10-CM | POA: Diagnosis not present

## 2020-10-08 DIAGNOSIS — F32A Depression, unspecified: Secondary | ICD-10-CM | POA: Diagnosis not present

## 2020-10-08 DIAGNOSIS — M1990 Unspecified osteoarthritis, unspecified site: Secondary | ICD-10-CM | POA: Diagnosis present

## 2020-10-08 DIAGNOSIS — Z833 Family history of diabetes mellitus: Secondary | ICD-10-CM

## 2020-10-08 DIAGNOSIS — Z841 Family history of disorders of kidney and ureter: Secondary | ICD-10-CM | POA: Diagnosis not present

## 2020-10-08 DIAGNOSIS — F320 Major depressive disorder, single episode, mild: Secondary | ICD-10-CM | POA: Diagnosis not present

## 2020-10-08 DIAGNOSIS — K2100 Gastro-esophageal reflux disease with esophagitis, without bleeding: Secondary | ICD-10-CM | POA: Diagnosis not present

## 2020-10-08 DIAGNOSIS — J1282 Pneumonia due to coronavirus disease 2019: Secondary | ICD-10-CM | POA: Diagnosis present

## 2020-10-08 DIAGNOSIS — I444 Left anterior fascicular block: Secondary | ICD-10-CM | POA: Diagnosis present

## 2020-10-08 DIAGNOSIS — K219 Gastro-esophageal reflux disease without esophagitis: Secondary | ICD-10-CM | POA: Diagnosis present

## 2020-10-08 DIAGNOSIS — Z7982 Long term (current) use of aspirin: Secondary | ICD-10-CM | POA: Diagnosis not present

## 2020-10-08 DIAGNOSIS — Z96642 Presence of left artificial hip joint: Secondary | ICD-10-CM | POA: Diagnosis not present

## 2020-10-08 DIAGNOSIS — Z800 Family history of malignant neoplasm of digestive organs: Secondary | ICD-10-CM

## 2020-10-08 DIAGNOSIS — Z79899 Other long term (current) drug therapy: Secondary | ICD-10-CM | POA: Diagnosis not present

## 2020-10-08 DIAGNOSIS — J9811 Atelectasis: Secondary | ICD-10-CM | POA: Diagnosis not present

## 2020-10-08 DIAGNOSIS — E785 Hyperlipidemia, unspecified: Secondary | ICD-10-CM | POA: Diagnosis present

## 2020-10-08 DIAGNOSIS — Z9049 Acquired absence of other specified parts of digestive tract: Secondary | ICD-10-CM | POA: Diagnosis not present

## 2020-10-08 DIAGNOSIS — Z6841 Body Mass Index (BMI) 40.0 and over, adult: Secondary | ICD-10-CM

## 2020-10-08 DIAGNOSIS — Z811 Family history of alcohol abuse and dependence: Secondary | ICD-10-CM | POA: Diagnosis not present

## 2020-10-08 DIAGNOSIS — Z8249 Family history of ischemic heart disease and other diseases of the circulatory system: Secondary | ICD-10-CM

## 2020-10-08 DIAGNOSIS — R0602 Shortness of breath: Secondary | ICD-10-CM

## 2020-10-08 DIAGNOSIS — J9601 Acute respiratory failure with hypoxia: Secondary | ICD-10-CM | POA: Diagnosis present

## 2020-10-08 DIAGNOSIS — F17210 Nicotine dependence, cigarettes, uncomplicated: Secondary | ICD-10-CM | POA: Diagnosis not present

## 2020-10-08 DIAGNOSIS — R0902 Hypoxemia: Secondary | ICD-10-CM | POA: Diagnosis not present

## 2020-10-08 HISTORY — DX: COVID-19: U07.1

## 2020-10-08 HISTORY — DX: Acute respiratory failure with hypoxia: J96.01

## 2020-10-08 LAB — COMPREHENSIVE METABOLIC PANEL
ALT: 54 U/L — ABNORMAL HIGH (ref 0–44)
AST: 35 U/L (ref 15–41)
Albumin: 3.1 g/dL — ABNORMAL LOW (ref 3.5–5.0)
Alkaline Phosphatase: 97 U/L (ref 38–126)
Anion gap: 14 (ref 5–15)
BUN: 32 mg/dL — ABNORMAL HIGH (ref 8–23)
CO2: 21 mmol/L — ABNORMAL LOW (ref 22–32)
Calcium: 8.8 mg/dL — ABNORMAL LOW (ref 8.9–10.3)
Chloride: 101 mmol/L (ref 98–111)
Creatinine, Ser: 1.19 mg/dL — ABNORMAL HIGH (ref 0.44–1.00)
GFR, Estimated: 46 mL/min — ABNORMAL LOW (ref >60)
Glucose, Bld: 170 mg/dL — ABNORMAL HIGH (ref 70–99)
Potassium: 3.7 mmol/L (ref 3.5–5.1)
Sodium: 136 mmol/L (ref 135–145)
Total Bilirubin: 0.6 mg/dL (ref 0.3–1.2)
Total Protein: 6.4 g/dL — ABNORMAL LOW (ref 6.5–8.1)

## 2020-10-08 LAB — CBC WITH DIFFERENTIAL/PLATELET
Abs Immature Granulocytes: 0.12 10*3/uL — ABNORMAL HIGH (ref 0.00–0.07)
Basophils Absolute: 0 10*3/uL (ref 0.0–0.1)
Basophils Relative: 0 %
Eosinophils Absolute: 0.1 10*3/uL (ref 0.0–0.5)
Eosinophils Relative: 1 %
HCT: 43.4 % (ref 36.0–46.0)
Hemoglobin: 14.8 g/dL (ref 12.0–15.0)
Immature Granulocytes: 2 %
Lymphocytes Relative: 17 %
Lymphs Abs: 1.4 10*3/uL (ref 0.7–4.0)
MCH: 31.2 pg (ref 26.0–34.0)
MCHC: 34.1 g/dL (ref 30.0–36.0)
MCV: 91.6 fL (ref 80.0–100.0)
Monocytes Absolute: 0.8 10*3/uL (ref 0.1–1.0)
Monocytes Relative: 10 %
Neutro Abs: 5.8 10*3/uL (ref 1.7–7.7)
Neutrophils Relative %: 70 %
Platelets: 291 10*3/uL (ref 150–400)
RBC: 4.74 MIL/uL (ref 3.87–5.11)
RDW: 12.6 % (ref 11.5–15.5)
WBC: 8.2 10*3/uL (ref 4.0–10.5)
nRBC: 0 % (ref 0.0–0.2)

## 2020-10-08 LAB — TROPONIN I (HIGH SENSITIVITY)
Troponin I (High Sensitivity): 7 ng/L (ref <18)
Troponin I (High Sensitivity): 7 ng/L (ref <18)

## 2020-10-08 LAB — LACTATE DEHYDROGENASE: LDH: 241 U/L — ABNORMAL HIGH (ref 98–192)

## 2020-10-08 LAB — TRIGLYCERIDES: Triglycerides: 105 mg/dL (ref <150)

## 2020-10-08 LAB — C-REACTIVE PROTEIN: CRP: 19 mg/dL — ABNORMAL HIGH (ref <1.0)

## 2020-10-08 LAB — FIBRINOGEN: Fibrinogen: 764 mg/dL — ABNORMAL HIGH (ref 210–475)

## 2020-10-08 LAB — D-DIMER, QUANTITATIVE: D-Dimer, Quant: 0.68 ug/mL-FEU — ABNORMAL HIGH (ref 0.00–0.50)

## 2020-10-08 LAB — FERRITIN: Ferritin: 422 ng/mL — ABNORMAL HIGH (ref 11–307)

## 2020-10-08 LAB — PROCALCITONIN: Procalcitonin: <0.1 ng/mL

## 2020-10-08 LAB — BRAIN NATRIURETIC PEPTIDE: B Natriuretic Peptide: 43.1 pg/mL (ref 0.0–100.0)

## 2020-10-08 LAB — LACTIC ACID, PLASMA
Lactic Acid, Venous: 2.3 mmol/L (ref 0.5–1.9)
Lactic Acid, Venous: 1.3 mmol/L (ref 0.5–1.9)

## 2020-10-08 MED ORDER — METHYLPREDNISOLONE SODIUM SUCC 40 MG IJ SOLR
40.0000 mg | Freq: Two times a day (BID) | INTRAMUSCULAR | Status: DC
  Administered 2020-10-08 – 2020-10-12 (×8): 40 mg via INTRAVENOUS
  Filled 2020-10-08 (×8): qty 1

## 2020-10-08 MED ORDER — ONDANSETRON HCL 4 MG PO TABS: 4.0000 mg | ORAL_TABLET | Freq: Four times a day (QID) | ORAL | Status: DC | PRN

## 2020-10-08 MED ORDER — ONDANSETRON HCL 4 MG/2ML IJ SOLN: 4.0000 mg | Freq: Four times a day (QID) | INTRAMUSCULAR | Status: DC | PRN

## 2020-10-08 MED ORDER — DULOXETINE HCL 60 MG PO CPEP
60.0000 mg | ORAL_CAPSULE | Freq: Every day | ORAL | Status: DC
  Administered 2020-10-08 – 2020-10-13 (×6): 60 mg via ORAL
  Filled 2020-10-08 (×6): qty 1

## 2020-10-08 MED ORDER — IPRATROPIUM-ALBUTEROL 20-100 MCG/ACT IN AERS
1.0000 | INHALATION_SPRAY | Freq: Four times a day (QID) | RESPIRATORY_TRACT | Status: DC
  Administered 2020-10-08 – 2020-10-10 (×8): 1 via RESPIRATORY_TRACT
  Filled 2020-10-08 (×2): qty 4

## 2020-10-08 MED ORDER — ACETAMINOPHEN 325 MG PO TABS: 650.0000 mg | ORAL_TABLET | Freq: Four times a day (QID) | ORAL | Status: DC | PRN

## 2020-10-08 MED ORDER — DEXAMETHASONE SODIUM PHOSPHATE 10 MG/ML IJ SOLN
6.0000 mg | Freq: Once | INTRAMUSCULAR | Status: AC
  Administered 2020-10-08: 6 mg via INTRAVENOUS
  Filled 2020-10-08: qty 1

## 2020-10-08 MED ORDER — GUAIFENESIN-DM 100-10 MG/5ML PO SYRP
10.0000 mL | ORAL_SOLUTION | ORAL | Status: DC | PRN
  Administered 2020-10-11: 10 mL via ORAL
  Filled 2020-10-08: qty 10

## 2020-10-08 MED ORDER — SODIUM CHLORIDE 0.9 % IV SOLN
100.0000 mg | Freq: Every day | INTRAVENOUS | Status: AC
  Administered 2020-10-09 – 2020-10-12 (×4): 100 mg via INTRAVENOUS
  Filled 2020-10-08 (×4): qty 20

## 2020-10-08 MED ORDER — ZINC SULFATE 220 (50 ZN) MG PO CAPS
220.0000 mg | ORAL_CAPSULE | Freq: Every day | ORAL | Status: DC
  Administered 2020-10-09 – 2020-10-13 (×5): 220 mg via ORAL
  Filled 2020-10-08 (×5): qty 1

## 2020-10-08 MED ORDER — ENOXAPARIN SODIUM 60 MG/0.6ML ~~LOC~~ SOLN
60.0000 mg | SUBCUTANEOUS | Status: DC
  Administered 2020-10-08 – 2020-10-12 (×5): 60 mg via SUBCUTANEOUS
  Filled 2020-10-08 (×5): qty 0.6

## 2020-10-08 MED ORDER — SODIUM CHLORIDE 0.9% FLUSH
3.0000 mL | Freq: Two times a day (BID) | INTRAVENOUS | Status: DC
  Administered 2020-10-08 – 2020-10-13 (×10): 3 mL via INTRAVENOUS

## 2020-10-08 MED ORDER — SODIUM CHLORIDE 0.9 % IV SOLN
100.0000 mg | Freq: Once | INTRAVENOUS | Status: AC
  Administered 2020-10-08: 100 mg via INTRAVENOUS
  Filled 2020-10-08: qty 20

## 2020-10-08 MED ORDER — HYDROCOD POLST-CPM POLST ER 10-8 MG/5ML PO SUER: 5.0000 mL | Freq: Two times a day (BID) | ORAL | Status: DC | PRN

## 2020-10-08 MED ORDER — PANTOPRAZOLE SODIUM 40 MG PO TBEC
40.0000 mg | DELAYED_RELEASE_TABLET | Freq: Every day | ORAL | Status: DC
  Administered 2020-10-08 – 2020-10-13 (×6): 40 mg via ORAL
  Filled 2020-10-08 (×6): qty 1

## 2020-10-08 MED ORDER — ASCORBIC ACID 500 MG PO TABS
500.0000 mg | ORAL_TABLET | Freq: Every day | ORAL | Status: DC
  Administered 2020-10-09 – 2020-10-13 (×5): 500 mg via ORAL
  Filled 2020-10-08 (×5): qty 1

## 2020-10-08 MED ORDER — IOHEXOL 350 MG/ML SOLN
100.0000 mL | Freq: Once | INTRAVENOUS | Status: AC | PRN
  Administered 2020-10-08: 80 mL via INTRAVENOUS

## 2020-10-08 NOTE — ED Notes (Signed)
Date and time results received: 10/08/20 1142 Test:lactic acid Critical Value: 2.3 Name of Provider Notified: [place]  Orders Received? Or Actions Taken?: no orders given

## 2020-10-08 NOTE — ED Triage Notes (Signed)
Diagnosed with COVID 10/7. C/o worsening SOB and cough.

## 2020-10-08 NOTE — H&P (Signed)
History and Physical    Jordan [name] PRF:[id number] DOB: 12/28/1950 DOA: 10/08/2020  PCP: [name], NP  Patient coming from: [facility]  I have personally briefly reviewed patient's old medical records in [place]  Chief Complaint: Progressive shortness of breath in setting of positive COVID-19 infection  HPI: Jordan [name] is a 69 y.o. female with medical history significant for HTN, HLD, osteoarthritis, depression, obesity, and tobacco use who presented to [facility] ED for evaluation of worsening shortness of breath.  Patient has received 2 doses of the Pfizer COVID-19 vaccine, last dose administered 08/16/2020.  She had a positive COVID-19 test on 09/28/2020 after exposure to a sick contact.  She was seen in the ED 10/01/2020 with symptoms of arthralgias, nausea, and fever.  Chest x-ray was without active disease and she was not hypoxic during that visit.  He was felt stable to discharge to home with recommendation for outpatient monoclonal antibody infusion however she declined infusion.  She says over the last few days she has been having frequent nausea without emesis.  She has had frequent nonproductive cough.  She says she becomes easily fatigued and short of breath with minimal activity.  She has been having subjective fevers.  She denies any chest pain, abdominal pain, lower extremity swelling, or dysuria.  She reports chronic loose/semisoft stools which is unchanged.  She came to the ED due to worsening symptoms.      [facility] ED Course:  Initial vitals showed BP 122/76, pulse 90, RR 24, temp 99.3 Fahrenheit, SPO2 86% on room air.  Patient was subsequently placed on 15 L HFNC with SPO2 >94%.  Labs show WBC 8.2, hemoglobin 14.8, platelets 291,000, sodium 136, potassium 3.7, bicarb 21, BUN 32, creatinine 1.19, serum glucose 170, AST 35, ALT 54, alk phos 97, total bilirubin 0.6, D-dimer 0.68, procalcitonin <0.10, LDH 241,  ferritin 422, triglycerides 105, fibrinogen 764, CRP 19.0, BNP 43.1, high-sensitivity troponin I 7x2.  Lactic acid 2.3 > 1.3.  Blood cultures were obtained and pending.  Portable chest x-ray showed slight bibasilar atelectasis.  CTA chest PE study showed multifocal airspace opacities.  No evidence of pulmonary embolus, thoracic aortic aneurysm or dissection.  Patient was given 6 mg IV Decadron and started on IV remdesivir.  The hospitalist service was consulted to admit for further evaluation and management.  Review of Systems: All systems reviewed and are negative except as documented in history of present illness above.   Past Medical History:  Diagnosis Date  . Cellulitis and abscess of finger, unspecified 401.9  . Cellulitis and abscess of foot, except toes   . Depressive disorder, not elsewhere classified   . GERD (gastroesophageal reflux disease)   . Gout, unspecified   . Hyperglycemia 05/08/2011  . Hyperlipidemia    borderline- not on meds  . Hypertension   . Insomnia   . Leg edema    chronic  . Obesity, unspecified   . Osteoarthritis   . Renal insufficiency, mild 05/08/2011  . Small bowel obstruction [facility])     Past Surgical History:  Procedure Laterality Date  . ABDOMINAL HYSTERECTOMY  2002   infection- no history of cancer  . APPENDECTOMY  2002  . catacact Right 11/01/2019  . CHOLECYSTECTOMY  1977  . ECTOPIC PREGNANCY SURGERY  1971  . right shoulder rotator cuff repair  06-07-10  . TOTAL HIP ARTHROPLASTY Left 1998    Social History:  reports that she has been  smoking cigarettes. She has been smoking about 0.25 packs per day. She has never used smokeless tobacco. She reports current alcohol use. She reports that she does not use drugs.  No Known Allergies  Family History  Problem Relation Age of Onset  . Diabetes Mother   . Cancer Mother        colon/ pancreatic  . Colon cancer Mother 19  . Hypertension Father   . Alcohol abuse Father   . Cirrhosis  Father   . Other Sister        Pyeoderma gangrenosis  . Obesity Daughter   . Diabetes Sister        type 2  . Kidney failure Sister   . Liver disease Sister   . Heart attack Brother   . Cirrhosis Sister        had NASH, died heart failure     Prior to Admission medications   Medication Sig Start Date End Date Taking? Authorizing Provider  acetaminophen (TYLENOL) 500 MG tablet Take 1,000 mg by mouth every 6 (six) hours as needed for fever.   Yes [provider]  allopurinol (ZYLOPRIM) 100 MG tablet TAKE 2 TABLETS BY MOUTH DAILY Patient taking differently: Take 100 mg by mouth 2 (two) times daily.  04/24/20  Yes [name], NP  aspirin EC 81 MG tablet Take 81 mg by mouth daily.   Yes [provider]  Cholecalciferol 25 MCG (1000 UT) capsule Take 1,000 Units by mouth daily.   Yes [provider]  DULoxetine (CYMBALTA) 60 MG capsule TAKE 1 CAPSULE BY MOUTH DAILY Patient taking differently: Take 60 mg by mouth daily.  04/24/20  Yes [name], NP  furosemide (LASIX) 40 MG tablet TAKE 1 TABLET (40 MG TOTAL) BY MOUTH DAILY AS NEEDED. Patient taking differently: Take 40 mg by mouth daily as needed for fluid.  03/06/20  Yes [name], NP  Multiple Vitamin (MULTIVITAMIN WITH MINERALS) TABS tablet Take 1 tablet by mouth daily.   Yes [provider]  ondansetron (ZOFRAN) 4 MG tablet Take 1 tablet (4 mg total) by mouth every 8 (eight) hours as needed for nausea or vomiting. 10/04/20  Yes [name], NP  pantoprazole (PROTONIX) 40 MG tablet TAKE 1 TABLET BY MOUTH DAILY. Patient taking differently: Take 40 mg by mouth daily.  03/22/20  Yes [name], NP  triamterene-hydrochlorothiazide (DYAZIDE) 37.5-25 MG capsule TAKE 1 CAPSULE BY MOUTH ONCE EVERY MORNING Patient taking differently: Take 1 capsule by mouth daily.  09/14/20  Yes [name], NP  azithromycin (ZITHROMAX) 250 MG tablet Take 2 tabs by mouth today, then 1  tab once daily for 4 more days Patient not taking: Reported on 10/08/2020 06/09/20   [name], NP  famotidine (PEPCID AC) 10 MG chewable tablet Chew 10 mg by mouth daily.    03/03/12  [provider]    Physical Exam: Vitals:   10/08/20 1626 10/08/20 1708 10/08/20 1808 10/08/20 1821  BP:  112/75 134/78 134/78  Pulse: 73 77 78 78  Resp:  17 19 19   Temp:   98.4 F (36.9 C)   TempSrc:    Oral  SpO2: 95% 96% 97%   Weight:    115.5 kg  Height:    5' 4"  (1.626 m)   Constitutional: Obese woman resting supine in bed, NAD, calm, comfortable Eyes: PERRL, lids and conjunctivae normal ENMT: Mucous membranes are moist. Posterior pharynx clear of any exudate or lesions.Normal dentition.  Neck: normal, supple, no masses. Respiratory:  Bibasilar inspiratory crackles.  Normal respiratory effort while wearing 10 L HFNC. No accessory muscle use.  Cardiovascular: Regular rate and rhythm, no murmurs / rubs / gallops. No extremity edema. 2+ pedal pulses. Abdomen: no tenderness, no masses palpated. No hepatosplenomegaly. Bowel sounds positive.  Musculoskeletal: no clubbing / cyanosis. No joint deformity upper and lower extremities. Good ROM, no contractures. Normal muscle tone.  Skin: no rashes, lesions, ulcers. No induration Neurologic: CN 2-12 grossly intact. Sensation intact, Strength 5/5 in all 4.  Psychiatric: Normal judgment and insight. Alert and oriented x 3. Normal mood.   Labs on Admission: I have personally reviewed following labs and imaging studies  CBC: Recent Labs  Lab 10/08/20 1045  WBC 8.2  NEUTROABS 5.8  HGB 14.8  HCT 43.4  MCV 91.6  PLT 454   Basic Metabolic Panel: Recent Labs  Lab 10/01/20 1856 10/08/20 1045  NA 137 136  K 3.6 3.7  CL 105 101  CO2 21* 21*  GLUCOSE 133* 170*  BUN 18 32*  CREATININE 0.88 1.19*  CALCIUM 8.6* 8.8*   GFR: Estimated Creatinine Clearance: 54.9 mL/min (A) (by C-G formula based on SCr of 1.19 mg/dL (H)). Liver Function  Tests: Recent Labs  Lab 10/01/20 1856 10/08/20 1045  AST 49* 35  ALT 61* 54*  ALKPHOS 77 97  BILITOT 0.3 0.6  PROT 6.2* 6.4*  ALBUMIN 3.6 3.1*   Recent Labs  Lab 10/01/20 1856  LIPASE 33   No results for input(s): AMMONIA in the last 168 hours. Coagulation Profile: No results for input(s): INR, PROTIME in the last 168 hours. Cardiac Enzymes: No results for input(s): CKTOTAL, CKMB, CKMBINDEX, TROPONINI in the last 168 hours. BNP (last 3 results) No results for input(s): PROBNP in the last 8760 hours. HbA1C: No results for input(s): HGBA1C in the last 72 hours. CBG: No results for input(s): GLUCAP in the last 168 hours. Lipid Profile: Recent Labs    10/08/20 1045  TRIG 105   Thyroid Function Tests: No results for input(s): TSH, T4TOTAL, FREET4, T3FREE, THYROIDAB in the last 72 hours. Anemia Panel: Recent Labs    10/08/20 1045  FERRITIN 422*   Urine analysis:    Component Value Date/Time   COLORURINE YELLOW 10/01/2020 [place] 10/01/2020 1737   LABSPEC 1.025 10/01/2020 1737   PHURINE 5.5 10/01/2020 1737   GLUCOSEU NEGATIVE 10/01/2020 1737   GLUCOSEU NEGATIVE 08/11/2015 1023   HGBUR TRACE (A) 10/01/2020 1737   BILIRUBINUR NEGATIVE 10/01/2020 1737   KETONESUR NEGATIVE 10/01/2020 1737   PROTEINUR 30 (A) 10/01/2020 1737   UROBILINOGEN 0.2 08/11/2015 1023   NITRITE NEGATIVE 10/01/2020 1737   LEUKOCYTESUR NEGATIVE 10/01/2020 1737    Radiological Exams on Admission: CT Angio Chest PE W and/or Wo Contrast  Result Date: 10/08/2020 CLINICAL DATA:  Shortness of breath. COVID-19 positive. Positive D-dimer EXAM: CT ANGIOGRAPHY CHEST WITH CONTRAST TECHNIQUE: Multidetector CT imaging of the chest was performed using the standard protocol during bolus administration of intravenous contrast. Multiplanar CT image reconstructions and MIPs were obtained to evaluate the vascular anatomy. CONTRAST:  16m OMNIPAQUE IOHEXOL 350 MG/ML SOLN COMPARISON:  Chest  radiograph October 08, 2020 FINDINGS: Cardiovascular: There is no evident pulmonary embolus. There is no thoracic aortic aneurysm or dissection. There is calcification at the origin of the left subclavian artery. Visualized great vessels otherwise appear unremarkable. There are foci of aortic atherosclerosis. There are scattered foci of coronary artery calcification. There is no pericardial effusion or pericardial thickening. Mediastinum/Nodes: There is a  1.0 cm nodular opacity in the left lobe of the thyroid. Thyroid otherwise appears unremarkable. There is no appreciable thoracic adenopathy. No esophageal lesions are evident. Lungs/Pleura: There is airspace opacity scattered throughout the lungs bilaterally without appreciable consolidation. No pleural effusions are appreciable. No areas of cavitation noted. Upper Abdomen: There is hepatic steatosis. There is upper abdominal aortic atherosclerosis. There is mild left adrenal hypertrophy bilaterally. Visualized upper abdominal structures otherwise appear unremarkable. Musculoskeletal: There is elevation of the right hemidiaphragm. There is degenerative change in the thoracic spine. There are no blastic or lytic bone lesions. No evident chest wall lesions. Review of the MIP images confirms the above findings. IMPRESSION: 1. No demonstrable pulmonary embolus. No thoracic aortic aneurysm or dissection. There are foci of aortic atherosclerosis as well as foci great vessel and coronary artery calcification. 2. Multifocal airspace opacity consistent with multifocal pneumonia, likely of atypical organism etiology. No consolidation. No pleural effusions. 3.  No evident adenopathy. 4.  Hepatic steatosis. Aortic Atherosclerosis (ICD10-I70.0). Electronically Signed   By: Lowella Grip III M.D.   On: 10/08/2020 12:50   DG Chest Portable 1 View  Result Date: 10/08/2020 CLINICAL DATA:  Shortness of breath.  COVID-19 positive EXAM: PORTABLE CHEST 1 VIEW COMPARISON:   October 01, 2020 FINDINGS: There is slight bibasilar atelectasis. The lungs elsewhere are clear. Heart is upper normal in size with pulmonary vascularity normal. No adenopathy. No bone lesions. IMPRESSION: Slight bibasilar atelectasis. Lungs elsewhere clear. Stable cardiac silhouette. No adenopathy evident. Electronically Signed   By: Lowella Grip III M.D.   On: 10/08/2020 11:03    EKG: Personally reviewed. Sinus rhythm without acute ischemic changes.  Not significantly changed when compared to prior.  Assessment/Plan Principal Problem:   Acute hypoxemic respiratory failure due to COVID-19 [facility]) Active Problems:   Depression   Essential hypertension   GERD (gastroesophageal reflux disease)  Jordan [name] is a 69 y.o. female with medical history significant for HTN, HLD, osteoarthritis, depression, obesity, and tobacco use who is admitted with acute hypoxemic respiratory failure due to COVID-19 pneumonia.  Acute hypoxemic respiratory failure due to COVID-19 multifocal pneumonia: SARS-CoV-2 positive 09/28/20.  Admitted with multifocal pneumonia seen on CTA, no evidence of PE.  Was requiring 15 L supplemental O2 via HFNC in the ED, now down to 10 L HFNC. -Continue IV remdesivir per protocol -Switch steroids to IV Solu-Medrol 40 mg twice daily -Continue supplemental oxygen and wean as able -Incentive spirometer, flutter valve, Combivent -Antitussives, vitamin C, zinc  Hypertension: Currently normotensive, holding home triamterene-HCTZ for now.  GERD: Continue Protonix.  Depression: Continue Cymbalta.  DVT prophylaxis: Lovenox Code Status: Full code, confirmed with patient. Family Communication: Discussed with patient, she has discussed with family Disposition Plan: From home and likely discharge to home pending symptomatic improvement, ability to wean down/off oxygen, and transition outpatient therapy. Consults called: None Admission status:  Status is: Inpatient  Remains  inpatient appropriate because:IV treatments appropriate due to intensity of illness or inability to take PO and Inpatient level of care appropriate due to severity of illness   Dispo: The patient is from: Home              Anticipated d/c is to: Home              Anticipated d/c date is: 3 days              Patient currently is not medically stable to d/c.    Zada Finders MD [facility]  If  7PM-7AM, please contact night-coverage [email or web address]  10/08/2020, 6:47 PM

## 2020-10-08 NOTE — Progress Notes (Signed)
RT called to assess patient upon arrival to room. Patient very SOB upon moving to bed, and SAT dropped to 64% on 6L when lying flat to be pulled up. Placed on 15L HFNC and currently 94%. Drops into the 80's when talking. MD aware

## 2020-10-08 NOTE — ED Provider Notes (Signed)
[facility] EMERGENCY DEPARTMENT Provider Note   CSN: [id number] Arrival date & time: 10/08/20  1761     History Chief Complaint  Patient presents with  . Shortness of Breath    COVID+    Jordan [name] is a 69 y.o. female.  The history is provided by the patient and medical records. No language interpreter was used.  Shortness of Breath Severity:  Severe Onset quality:  Gradual Duration:  3 days Timing:  Constant Progression:  Worsening Chronicity:  New Context: URI   Relieved by:  Oxygen Worsened by:  Coughing Ineffective treatments:  None tried Associated symptoms: cough, diaphoresis and sputum production   Associated symptoms: no abdominal pain, no chest pain, no fever, no headaches, no neck pain, no rash, no vomiting and no wheezing        Past Medical History:  Diagnosis Date  . Cellulitis and abscess of finger, unspecified 401.9  . Cellulitis and abscess of foot, except toes   . Depressive disorder, not elsewhere classified   . GERD (gastroesophageal reflux disease)   . Gout, unspecified   . Hyperglycemia 05/08/2011  . Hyperlipidemia    borderline- not on meds  . Hypertension   . Insomnia   . Leg edema    chronic  . Obesity, unspecified   . Osteoarthritis   . Renal insufficiency, mild 05/08/2011  . Small bowel obstruction [facility])     Patient Active Problem List   Diagnosis Date Noted  . SBO (small bowel obstruction) ([place]) 12/01/2018  . Hypokalemia 12/01/2018  . Tobacco abuse 12/01/2018  . Benign paroxysmal positional vertigo 07/25/2015  . Routine general medical examination at a health care facility 07/06/2012  . GERD (gastroesophageal reflux disease) 01/03/2012  . Hyperglycemia 05/08/2011  . General medical examination 05/06/2011  . Gout 02/21/2010  . Degenerative disc disease, lumbar 07/11/2009  . Osteoarthritis 02/19/2009  . Hyperlipidemia 01/16/2009  . Obesity, Class III, BMI 40-49.9 (morbid obesity) ([place]) 02/17/2008  .  Depression 09/04/2007  . Essential hypertension 09/04/2007    Past Surgical History:  Procedure Laterality Date  . ABDOMINAL HYSTERECTOMY  2002   infection- no history of cancer  . APPENDECTOMY  2002  . catacact Right 11/01/2019  . CHOLECYSTECTOMY  1977  . ECTOPIC PREGNANCY SURGERY  1971  . right shoulder rotator cuff repair  06-07-10  . TOTAL HIP ARTHROPLASTY Left 1998     OB History   No obstetric history on file.     Family History  Problem Relation Age of Onset  . Diabetes Mother   . Cancer Mother        colon/ pancreatic  . Colon cancer Mother 59  . Hypertension Father   . Alcohol abuse Father   . Cirrhosis Father   . Other Sister        Pyeoderma gangrenosis  . Obesity Daughter   . Diabetes Sister        type 2  . Kidney failure Sister   . Liver disease Sister   . Heart attack Brother   . Cirrhosis Sister        had NASH, died heart failure    Social History   Tobacco Use  . Smoking status: Current Some Day Smoker    Packs/day: 0.25    Types: Cigarettes    Last attempt to quit: 12/26/2014    Years since quitting: 5.7  . Smokeless tobacco: Never Used  Vaping Use  . Vaping Use: Never used  Substance Use Topics  .  Alcohol use: Yes    Alcohol/week: 0.0 standard drinks    Comment: rare  . Drug use: No    Home Medications Prior to Admission medications   Medication Sig Start Date End Date Taking? Authorizing Provider  allopurinol (ZYLOPRIM) 100 MG tablet TAKE 2 TABLETS BY MOUTH DAILY 04/24/20   [name], NP  aspirin EC 81 MG tablet Take 81 mg by mouth daily.    [provider]  azithromycin (ZITHROMAX) 250 MG tablet Take 2 tabs by mouth today, then 1 tab once daily for 4 more days 06/09/20   [name], NP  Cholecalciferol 25 MCG (1000 UT) capsule Take 1,000 Units by mouth daily.    [provider]  DULoxetine (CYMBALTA) 60 MG capsule TAKE 1 CAPSULE BY MOUTH DAILY 04/24/20   [name], NP  furosemide (LASIX)  40 MG tablet TAKE 1 TABLET (40 MG TOTAL) BY MOUTH DAILY AS NEEDED. 03/06/20   [name], NP  Multiple Vitamin (MULTIVITAMIN WITH MINERALS) TABS tablet Take 1 tablet by mouth daily.    [provider]  ondansetron (ZOFRAN) 4 MG tablet Take 1 tablet (4 mg total) by mouth every 8 (eight) hours as needed for nausea or vomiting. 10/04/20   [name], NP  pantoprazole (PROTONIX) 40 MG tablet TAKE 1 TABLET BY MOUTH DAILY. 03/22/20   [name], NP  triamterene-hydrochlorothiazide (DYAZIDE) 37.5-25 MG capsule TAKE 1 CAPSULE BY MOUTH ONCE EVERY MORNING 09/14/20   [name], NP  famotidine (PEPCID AC) 10 MG chewable tablet Chew 10 mg by mouth daily.    03/03/12  [provider]    Allergies    Patient has no known allergies.  Review of Systems   Review of Systems  Constitutional: Positive for chills, diaphoresis and fatigue. Negative for fever.  HENT: Positive for congestion.   Eyes: Negative for visual disturbance.  Respiratory: Positive for cough, sputum production, chest tightness and shortness of breath. Negative for wheezing and stridor.   Cardiovascular: Negative for chest pain, palpitations and leg swelling.  Gastrointestinal: Positive for nausea. Negative for abdominal pain, constipation, diarrhea and vomiting.  Genitourinary: Negative for dysuria.  Musculoskeletal: Negative for back pain, neck pain and neck stiffness.  Skin: Negative for rash and wound.  Neurological: Positive for light-headedness. Negative for syncope and headaches.  Psychiatric/Behavioral: Negative for agitation and confusion.  All other systems reviewed and are negative.   Physical Exam Updated Vital Signs BP 122/76 (BP Location: Left Arm)   Pulse 90   Temp 99.3 F (37.4 C) (Oral)   Resp (!) 24   SpO2 90%   Physical Exam Vitals and nursing note reviewed.  Constitutional:      General: She is not in acute distress.    Appearance: She is well-developed. She  is not ill-appearing, toxic-appearing or diaphoretic.     Interventions: She is not intubated. HENT:     Head: Normocephalic and atraumatic.     Right Ear: External ear normal.     Left Ear: External ear normal.     Nose: Nose normal.     Mouth/Throat:     Pharynx: No oropharyngeal exudate.  Eyes:     Conjunctiva/sclera: Conjunctivae normal.     Pupils: Pupils are equal, round, and reactive to light.  Cardiovascular:     Rate and Rhythm: Normal rate.  Pulmonary:     Effort: Tachypnea present. No respiratory distress. She is not intubated.     Breath sounds: No stridor. Rhonchi present. No wheezing or rales.  Chest:     Chest wall: No tenderness.  Abdominal:     General: There is no distension.     Palpations: Abdomen is soft.     Tenderness: There is no abdominal tenderness. There is no rebound.  Musculoskeletal:     Cervical back: Normal range of motion and neck supple.     Right lower leg: No tenderness ( ). Edema (mild) present.     Left lower leg: No tenderness. Edema (mild) present.  Skin:    General: Skin is warm.     Capillary Refill: Capillary refill takes less than 2 seconds.     Findings: No erythema or rash.  Neurological:     General: No focal deficit present.     Mental Status: She is alert and oriented to person, place, and time.     Motor: No abnormal muscle tone.     Coordination: Coordination normal.     Deep Tendon Reflexes: Reflexes are normal and symmetric.  Psychiatric:        Mood and Affect: Mood normal.     ED Results / Procedures / Treatments   Labs (all labs ordered are listed, but only abnormal results are displayed) Labs Reviewed  LACTIC ACID, PLASMA - Abnormal; Notable for the following components:      Result Value   Lactic Acid, Venous 2.3 (*)    All other components within normal limits  CBC WITH DIFFERENTIAL/PLATELET - Abnormal; Notable for the following components:   Abs Immature Granulocytes 0.12 (*)    All other components within  normal limits  COMPREHENSIVE METABOLIC PANEL - Abnormal; Notable for the following components:   CO2 21 (*)    Glucose, Bld 170 (*)    BUN 32 (*)    Creatinine, Ser 1.19 (*)    Calcium 8.8 (*)    Total Protein 6.4 (*)    Albumin 3.1 (*)    ALT 54 (*)    GFR, Estimated 46 (*)    All other components within normal limits  D-DIMER, QUANTITATIVE (NOT AT [facility]) - Abnormal; Notable for the following components:   D-Dimer, Quant 0.68 (*)    All other components within normal limits  LACTATE DEHYDROGENASE - Abnormal; Notable for the following components:   LDH 241 (*)    All other components within normal limits  FERRITIN - Abnormal; Notable for the following components:   Ferritin 422 (*)    All other components within normal limits  FIBRINOGEN - Abnormal; Notable for the following components:   Fibrinogen 764 (*)    All other components within normal limits  C-REACTIVE PROTEIN - Abnormal; Notable for the following components:   CRP 19.0 (*)    All other components within normal limits  CULTURE, BLOOD (ROUTINE X 2)  CULTURE, BLOOD (ROUTINE X 2)  LACTIC ACID, PLASMA  PROCALCITONIN  TRIGLYCERIDES  BRAIN NATRIURETIC PEPTIDE  TROPONIN I (HIGH SENSITIVITY)  TROPONIN I (HIGH SENSITIVITY)    EKG EKG Interpretation  Date/Time:  Sunday October 08 2020 10:12:57 EDT Ventricular Rate:  88 PR Interval:  146 QRS Duration: 84 QT Interval:  360 QTC Calculation: 435 R Axis:   -16 Text Interpretation: Normal sinus rhythm Moderate voltage criteria for LVH, may be normal variant ( R in aVL , Cornell product ) Borderline ECG When compared to prior, similar apperance. No STEMI Confirmed by Jordan [name] [phone]) on 10/08/2020 10:22:29 AM   Radiology CT Angio Chest PE W and/or Wo Contrast  Result Date: 10/08/2020 CLINICAL DATA:  Shortness of breath. COVID-19 positive. Positive D-dimer EXAM: CT ANGIOGRAPHY CHEST WITH CONTRAST TECHNIQUE: Multidetector CT imaging of the chest was performed using  the standard protocol during bolus administration of intravenous contrast. Multiplanar CT image reconstructions and MIPs were obtained to evaluate the vascular anatomy. CONTRAST:  65mL OMNIPAQUE IOHEXOL 350 MG/ML SOLN COMPARISON:  Chest radiograph October 08, 2020 FINDINGS: Cardiovascular: There is no evident pulmonary embolus. There is no thoracic aortic aneurysm or dissection. There is calcification at the origin of the left subclavian artery. Visualized great vessels otherwise appear unremarkable. There are foci of aortic atherosclerosis. There are scattered foci of coronary artery calcification. There is no pericardial effusion or pericardial thickening. Mediastinum/Nodes: There is a 1.0 cm nodular opacity in the left lobe of the thyroid. Thyroid otherwise appears unremarkable. There is no appreciable thoracic adenopathy. No esophageal lesions are evident. Lungs/Pleura: There is airspace opacity scattered throughout the lungs bilaterally without appreciable consolidation. No pleural effusions are appreciable. No areas of cavitation noted. Upper Abdomen: There is hepatic steatosis. There is upper abdominal aortic atherosclerosis. There is mild left adrenal hypertrophy bilaterally. Visualized upper abdominal structures otherwise appear unremarkable. Musculoskeletal: There is elevation of the right hemidiaphragm. There is degenerative change in the thoracic spine. There are no blastic or lytic bone lesions. No evident chest wall lesions. Review of the MIP images confirms the above findings. IMPRESSION: 1. No demonstrable pulmonary embolus. No thoracic aortic aneurysm or dissection. There are foci of aortic atherosclerosis as well as foci great vessel and coronary artery calcification. 2. Multifocal airspace opacity consistent with multifocal pneumonia, likely of atypical organism etiology. No consolidation. No pleural effusions. 3.  No evident adenopathy. 4.  Hepatic steatosis. Aortic Atherosclerosis (ICD10-I70.0).  Electronically Signed   By: Lowella Grip III M.D.   On: 10/08/2020 12:50   DG Chest Portable 1 View  Result Date: 10/08/2020 CLINICAL DATA:  Shortness of breath.  COVID-19 positive EXAM: PORTABLE CHEST 1 VIEW COMPARISON:  October 01, 2020 FINDINGS: There is slight bibasilar atelectasis. The lungs elsewhere are clear. Heart is upper normal in size with pulmonary vascularity normal. No adenopathy. No bone lesions. IMPRESSION: Slight bibasilar atelectasis. Lungs elsewhere clear. Stable cardiac silhouette. No adenopathy evident. Electronically Signed   By: Lowella Grip III M.D.   On: 10/08/2020 11:03    Procedures Procedures (including critical care time)  CRITICAL CARE Performed by: Gwenyth Allegra Jaliah Foody Total critical care time: 40 minutes Critical care time was exclusive of separately billable procedures and treating other patients. Critical care was necessary to treat or prevent imminent or life-threatening deterioration. Critical care was time spent personally by me on the following activities: development of treatment plan with patient and/or surrogate as well as nursing, discussions with consultants, evaluation of patient's response to treatment, examination of patient, obtaining history from patient or surrogate, ordering and performing treatments and interventions, ordering and review of laboratory studies, ordering and review of radiographic studies, pulse oximetry and re-evaluation of patient's condition.   Medications Ordered in ED Medications  remdesivir 100 mg in sodium chloride 0.9 % 100 mL IVPB (has no administration in time range)  dexamethasone (DECADRON) injection 6 mg (6 mg Intravenous Given 10/08/20 1054)  remdesivir 100 mg in sodium chloride 0.9 % 100 mL IVPB (0 mg Intravenous Stopped 10/08/20 1141)    Followed by  remdesivir 100 mg in sodium chloride 0.9 % 100 mL IVPB (0 mg Intravenous Stopped 10/08/20 1252)  iohexol (OMNIPAQUE) 350 MG/ML injection 100 mL (80  mLs Intravenous Contrast Given 10/08/20 1158)  ED Course  I have reviewed the triage vital signs and the nursing notes.  Pertinent labs & imaging results that were available during my care of the patient were reviewed by me and considered in my medical decision making (see chart for details).    MDM Rules/Calculators/A&P                          SABRIEL BORROMEO is a 69 y.o. 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  On arrival to the emergency department, when patient laid down, oxygen saturation was 64% on room air.  She does not take oxygen at home.  She does not have a history of COPD or asthma.  Patient did smoke tobacco prior to Covid infection.  Patient reports that she was diagnosed with COVID-19 10 days ago after a trip [place] the beach where they found a another traveler was positive.  Patient reports she has not had any chest pain but has had significantly worsening shortness of breath, cough, and fatigue.  Her shortness breath is exertional.  She denies history of DVT or PE and denies any new leg pain or leg swelling.  She does appear to take Lasix for edema but I do not see a diagnosis of CHF in the chart.  Patient reports she is having some nausea and had Zofran called in for her but has not had further nausea or vomiting at this time.  She denies palpitations.  She has not had syncope but has felt lightheaded.  Patient has had both Pfizer vaccines for COVID-19.  On exam, patient does have coarse breath sounds bilaterally with no significant wheezing.  No significant chest tenderness.  Abdomen nontender.  Good pulses in extremities.  Legs have minimal edema.  Back nontender.  EKG shows no STEMI.  Patient is now on 15 L moistened high flow nasal cannula to  maintain oxygen saturations in the low 90s.  Due to his new oxygen requirement, patient will need admission.  We will get screening labs and preadmission orders including a D-dimer and troponin.  We will get chest x-ray.  She will be given Decadron and will get remdesivir per pharmacy.  Patient reports that she qualified for Monocal antibody infusion but she did not want it last week.  Anticipate admission after work-up for further management of significant hypoxia in the setting of COVID-19 infection.  CT scan showed no pulmonary embolism after dimer was positive.  Lactic acid elevated but then improved.  Troponin negative.  Patient will be admitted for further management of hypoxia related to COVID-19 infection.  Remdesivir per pharmacy was ordered as was steroids.  Medicine will admit for further management of Covid with hypoxia.     Final Clinical Impression(s) / ED Diagnoses Final diagnoses:  Hypoxia  SOB (shortness of breath)  COVID-19     Clinical Impression: 1. Hypoxia   2. SOB (shortness of breath)   3. COVID-19     Disposition: Admit  This note was prepared with assistance of Dragon voice recognition software. Occasional wrong-word or sound-a-like substitutions may have occurred due to the inherent limitations of voice recognition software.     Blaise Grieshaber, Gwenyth Allegra, MD 10/08/20 [phone]

## 2020-10-08 NOTE — ED Notes (Signed)
Report given to [facility]. 

## 2020-10-08 NOTE — ED Notes (Signed)
Oxygen weaned down to 12 liters high flow tolerating well

## 2020-10-08 NOTE — ED Notes (Signed)
Bedside commode given to Pt

## 2020-10-09 DIAGNOSIS — K2100 Gastro-esophageal reflux disease with esophagitis, without bleeding: Secondary | ICD-10-CM

## 2020-10-09 DIAGNOSIS — J1282 Pneumonia due to coronavirus disease 2019: Secondary | ICD-10-CM

## 2020-10-09 DIAGNOSIS — F320 Major depressive disorder, single episode, mild: Secondary | ICD-10-CM

## 2020-10-09 DIAGNOSIS — I10 Essential (primary) hypertension: Secondary | ICD-10-CM

## 2020-10-09 LAB — PHOSPHORUS: Phosphorus: 3.7 mg/dL (ref 2.5–4.6)

## 2020-10-09 LAB — COMPREHENSIVE METABOLIC PANEL
ALT: 47 U/L — ABNORMAL HIGH (ref 0–44)
AST: 27 U/L (ref 15–41)
Albumin: 3.2 g/dL — ABNORMAL LOW (ref 3.5–5.0)
Alkaline Phosphatase: 102 U/L (ref 38–126)
Anion gap: 12 (ref 5–15)
BUN: 33 mg/dL — ABNORMAL HIGH (ref 8–23)
CO2: 25 mmol/L (ref 22–32)
Calcium: 8.9 mg/dL (ref 8.9–10.3)
Chloride: 101 mmol/L (ref 98–111)
Creatinine, Ser: 0.93 mg/dL (ref 0.44–1.00)
GFR, Estimated: >60 mL/min (ref >60)
Glucose, Bld: 194 mg/dL — ABNORMAL HIGH (ref 70–99)
Potassium: 3.7 mmol/L (ref 3.5–5.1)
Sodium: 138 mmol/L (ref 135–145)
Total Bilirubin: 0.8 mg/dL (ref 0.3–1.2)
Total Protein: 6.7 g/dL (ref 6.5–8.1)

## 2020-10-09 LAB — CBC WITH DIFFERENTIAL/PLATELET
Abs Immature Granulocytes: 0.08 10*3/uL — ABNORMAL HIGH (ref 0.00–0.07)
Basophils Absolute: 0 10*3/uL (ref 0.0–0.1)
Basophils Relative: 0 %
Eosinophils Absolute: 0 10*3/uL (ref 0.0–0.5)
Eosinophils Relative: 0 %
HCT: 40.3 % (ref 36.0–46.0)
Hemoglobin: 13.8 g/dL (ref 12.0–15.0)
Immature Granulocytes: 2 %
Lymphocytes Relative: 23 %
Lymphs Abs: 1.1 10*3/uL (ref 0.7–4.0)
MCH: 31.6 pg (ref 26.0–34.0)
MCHC: 34.2 g/dL (ref 30.0–36.0)
MCV: 92.2 fL (ref 80.0–100.0)
Monocytes Absolute: 0.2 10*3/uL (ref 0.1–1.0)
Monocytes Relative: 5 %
Neutro Abs: 3.5 10*3/uL (ref 1.7–7.7)
Neutrophils Relative %: 70 %
Platelets: 289 10*3/uL (ref 150–400)
RBC: 4.37 MIL/uL (ref 3.87–5.11)
RDW: 12.5 % (ref 11.5–15.5)
WBC: 5 10*3/uL (ref 4.0–10.5)
nRBC: 0 % (ref 0.0–0.2)

## 2020-10-09 LAB — MAGNESIUM: Magnesium: 2 mg/dL (ref 1.7–2.4)

## 2020-10-09 LAB — FERRITIN: Ferritin: 422 ng/mL — ABNORMAL HIGH (ref 11–307)

## 2020-10-09 LAB — HIV ANTIBODY (ROUTINE TESTING W REFLEX): HIV Screen 4th Generation wRfx: NONREACTIVE

## 2020-10-09 LAB — C-REACTIVE PROTEIN: CRP: 18.6 mg/dL — ABNORMAL HIGH (ref <1.0)

## 2020-10-09 LAB — D-DIMER, QUANTITATIVE: D-Dimer, Quant: 0.43 ug/mL-FEU (ref 0.00–0.50)

## 2020-10-09 NOTE — Progress Notes (Signed)
PROGRESS NOTE    Jordan [name]  ZOX:[id number] DOB: 12-29-1950 DOA: 10/08/2020 PCP: [name], NP     Brief Narrative:  Jordan [name] is a 69 y.o. WF PMHx Depression, HTN, HLD, osteoarthritis, MORBID obesity, Tobacco abuse, SBO,  who presented to [place] ED for evaluation of worsening shortness of breath.  Patient has received 2 doses of the Pfizer COVID-19 vaccine, last dose administered 08/16/2020.  She had a positive COVID-19 test on 09/28/2020 after exposure to a sick contact.  She was seen in the ED 10/01/2020 with symptoms of arthralgias, nausea, and fever.  Chest x-ray was without active disease and she was not hypoxic during that visit.  He was felt stable to discharge to home with recommendation for outpatient monoclonal antibody infusion however she declined infusion.  She says over the last few days she has been having frequent nausea without emesis.  She has had frequent nonproductive cough.  She says she becomes easily fatigued and short of breath with minimal activity.  She has been having subjective fevers.  She denies any chest pain, abdominal pain, lower extremity swelling, or dysuria.  She reports chronic loose/semisoft stools which is unchanged.  She came to the ED due to worsening symptoms.      [facility] ED Course:  Initial vitals showed BP 122/76, pulse 90, RR 24, temp 99.3 Fahrenheit, SPO2 86% on room air.  Patient was subsequently placed on 15 L HFNC with SPO2 >94%.   Subjective: A/O x4, positive S OB, negative nausea, negative vomiting, negative diarrhea.   Assessment & Plan: Covid vaccination; vaccinated   Principal Problem:   Acute hypoxemic respiratory failure due to COVID-19 [facility]) Active Problems:   Depression   Essential hypertension   GERD (gastroesophageal reflux disease)   Morbidly obese (HCC)   Acute hypoxemic respiratory failure/ COVID-19 multifocal pneumonia: COVID-19 Labs  Recent Labs     10/08/20 1045 10/09/20 0417  DDIMER 0.68* 0.43  FERRITIN 422* 422*  LDH 241*  --   CRP 19.0* 18.6*   10/7 SARS coronavirus positive (outside facility)   -Solu-Medrol 60 mg BID -Remdesivir 5 days per pharmacy protocol -Vitamin C and zinc per Covid protocol -Respimat QID -We will discuss starting Baricitinib with patient -Flutter valve -Incentive spirometry -Titrate O2 to maintain SPO2> 88% -Prone patient 8 hours/day; if patient cannot tolerate prone to 3 hours per shift    Hypertension: -Currently controlled on no medication. -Continue to hold home triamterene-HCTZ for now.  GERD: -Continue Protonix.  Depression: -Cymbalta 60 mg daily.  Morbidly obese   DVT prophylaxis: Lovenox Code Status: Full Family Communication:  Status is: Inpatient    Dispo: The patient is from: Home              Anticipated d/c is to: Home              Anticipated d/c date is: 10/23              Patient currently unstable      Consultants:    Procedures/Significant Events:    I have personally reviewed and interpreted all radiology studies and my findings are as above.  VENTILATOR SETTINGS: HFNC 10/18 Flow 10 L/min SPO2 98%   Cultures 10/7 SARS coronavirus positive (outside facility) 10/18 acute hepatitis panel pending  Antimicrobials: Anti-infectives (From admission, onward)   Start     Dose/Rate Route Frequency Ordered Stop   10/09/20 1000  remdesivir 100 mg in sodium chloride 0.9 %  100 mL IVPB        100 mg 200 mL/hr over 30 Minutes Intravenous Daily 10/08/20 1048 10/13/20 0959   10/08/20 1130  remdesivir 100 mg in sodium chloride 0.9 % 100 mL IVPB       "Followed by" Linked Group Details   100 mg 200 mL/hr over 30 Minutes Intravenous  Once 10/08/20 1048 10/08/20 1252   10/08/20 1100  remdesivir 100 mg in sodium chloride 0.9 % 100 mL IVPB       "Followed by" Linked Group Details   100 mg 200 mL/hr over 30 Minutes Intravenous  Once 10/08/20 1048 10/08/20  1141       Devices    LINES / TUBES:      Continuous Infusions: . remdesivir 100 mg in NS 100 mL 100 mg (10/09/20 1013)     Objective: Vitals:   10/08/20 2208 10/09/20 0152 10/09/20 0546 10/09/20 1414  BP: 124/67 (!) 119/57 117/62 132/78  Pulse: 67 65 66 78  Resp: 19 19 18 20   Temp: 98.5 F (36.9 C) 98.7 F (37.1 C) 98.6 F (37 C) 98.2 F (36.8 C)  TempSrc: Oral Oral Oral Oral  SpO2: 96% 96% 98% 93%  Weight:      Height:        Intake/Output Summary (Last 24 hours) at 10/09/2020 1804 Last data filed at 10/09/2020 1225 Gross per 24 hour  Intake 510 ml  Output --  Net 510 ml   Filed Weights   10/08/20 1821  Weight: 115.5 kg    Examination:  General: A/O x4, positive acute respiratory distress Eyes: negative scleral hemorrhage, negative anisocoria, negative icterus ENT: Negative Runny nose, negative gingival bleeding, Neck:  Negative scars, masses, torticollis, lymphadenopathy, JVD Lungs: tachypneic, decreased breath sounds bilaterally without wheezes or crackles Cardiovascular: Regular rate and rhythm without murmur gallop or rub normal S1 and S2 Abdomen: MORBIDLY OBESE, negative abdominal pain, nondistended, positive soft, bowel sounds, no rebound, no ascites, no appreciable mass Extremities: No significant cyanosis, clubbing, or edema bilateral lower extremities Skin: Negative rashes, lesions, ulcers Psychiatric:  Negative depression, negative anxiety, negative fatigue, negative mania  Central nervous system:  Cranial nerves II through XII intact, tongue/uvula midline, all extremities muscle strength 5/5, sensation intact throughout, negative dysarthria, negative expressive aphasia, negative receptive aphasia.  .     Data Reviewed: Care during the described time interval was provided by me .  I have reviewed this patient's available data, including medical history, events of note, physical examination, and all test results as part of my  evaluation.  CBC: Recent Labs  Lab 10/08/20 1045 10/09/20 0417  WBC 8.2 5.0  NEUTROABS 5.8 3.5  HGB 14.8 13.8  HCT 43.4 40.3  MCV 91.6 92.2  PLT 291 485   Basic Metabolic Panel: Recent Labs  Lab 10/08/20 1045 10/09/20 0417  NA 136 138  K 3.7 3.7  CL 101 101  CO2 21* 25  GLUCOSE 170* 194*  BUN 32* 33*  CREATININE 1.19* 0.93  CALCIUM 8.8* 8.9  MG  --  2.0  PHOS  --  3.7   GFR: Estimated Creatinine Clearance: 70.2 mL/min (by C-G formula based on SCr of 0.93 mg/dL). Liver Function Tests: Recent Labs  Lab 10/08/20 1045 10/09/20 0417  AST 35 27  ALT 54* 47*  ALKPHOS 97 102  BILITOT 0.6 0.8  PROT 6.4* 6.7  ALBUMIN 3.1* 3.2*   No results for input(s): LIPASE, AMYLASE in the last 168 hours. No results for input(s): AMMONIA  in the last 168 hours. Coagulation Profile: No results for input(s): INR, PROTIME in the last 168 hours. Cardiac Enzymes: No results for input(s): CKTOTAL, CKMB, CKMBINDEX, TROPONINI in the last 168 hours. BNP (last 3 results) No results for input(s): PROBNP in the last 8760 hours. HbA1C: No results for input(s): HGBA1C in the last 72 hours. CBG: No results for input(s): GLUCAP in the last 168 hours. Lipid Profile: Recent Labs    10/08/20 1045  TRIG 105   Thyroid Function Tests: No results for input(s): TSH, T4TOTAL, FREET4, T3FREE, THYROIDAB in the last 72 hours. Anemia Panel: Recent Labs    10/08/20 1045 10/09/20 0417  FERRITIN 422* 422*   Sepsis Labs: Recent Labs  Lab 10/08/20 1045 10/08/20 1103 10/08/20 1301  PROCALCITON <0.10  --   --   LATICACIDVEN  --  2.3* 1.3    Recent Results (from the past 240 hour(s))  Blood Culture (routine x 2)     Status: None (Preliminary result)   Collection Time: 10/08/20 10:25 AM   Specimen: Right Antecubital; Blood  Result Value Ref Range Status   Specimen Description   Final    RIGHT ANTECUBITAL Performed at [facility], [address] [place], [place] [address]     Special Requests   Final    BOTTLES DRAWN AEROBIC AND ANAEROBIC Blood Culture adequate volume Performed at [facility], [address] [place], [place] [address]    Culture   Final    NO GROWTH < 24 HOURS Performed at [facility], [place] [address] [place], [place] [address]    Report Status PENDING  Incomplete         Radiology Studies: CT Angio Chest PE W and/or Wo Contrast  Result Date: 10/08/2020 CLINICAL DATA:  Shortness of breath. COVID-19 positive. Positive D-dimer EXAM: CT ANGIOGRAPHY CHEST WITH CONTRAST TECHNIQUE: Multidetector CT imaging of the chest was performed using the standard protocol during bolus administration of intravenous contrast. Multiplanar CT image reconstructions and MIPs were obtained to evaluate the vascular anatomy. CONTRAST:  52mL OMNIPAQUE IOHEXOL 350 MG/ML SOLN COMPARISON:  Chest radiograph October 08, 2020 FINDINGS: Cardiovascular: There is no evident pulmonary embolus. There is no thoracic aortic aneurysm or dissection. There is calcification at the origin of the left subclavian artery. Visualized great vessels otherwise appear unremarkable. There are foci of aortic atherosclerosis. There are scattered foci of coronary artery calcification. There is no pericardial effusion or pericardial thickening. Mediastinum/Nodes: There is a 1.0 cm nodular opacity in the left lobe of the thyroid. Thyroid otherwise appears unremarkable. There is no appreciable thoracic adenopathy. No esophageal lesions are evident. Lungs/Pleura: There is airspace opacity scattered throughout the lungs bilaterally without appreciable consolidation. No pleural effusions are appreciable. No areas of cavitation noted. Upper Abdomen: There is hepatic steatosis. There is upper abdominal aortic atherosclerosis. There is mild left adrenal hypertrophy bilaterally. Visualized upper abdominal structures otherwise appear unremarkable. Musculoskeletal: There is elevation of the right  hemidiaphragm. There is degenerative change in the thoracic spine. There are no blastic or lytic bone lesions. No evident chest wall lesions. Review of the MIP images confirms the above findings. IMPRESSION: 1. No demonstrable pulmonary embolus. No thoracic aortic aneurysm or dissection. There are foci of aortic atherosclerosis as well as foci great vessel and coronary artery calcification. 2. Multifocal airspace opacity consistent with multifocal pneumonia, likely of atypical organism etiology. No consolidation. No pleural effusions. 3.  No evident adenopathy. 4.  Hepatic steatosis. Aortic Atherosclerosis (  ICD10-I70.0). Electronically Signed   By: Lowella Grip III M.D.   On: 10/08/2020 12:50   DG Chest Portable 1 View  Result Date: 10/08/2020 CLINICAL DATA:  Shortness of breath.  COVID-19 positive EXAM: PORTABLE CHEST 1 VIEW COMPARISON:  October 01, 2020 FINDINGS: There is slight bibasilar atelectasis. The lungs elsewhere are clear. Heart is upper normal in size with pulmonary vascularity normal. No adenopathy. No bone lesions. IMPRESSION: Slight bibasilar atelectasis. Lungs elsewhere clear. Stable cardiac silhouette. No adenopathy evident. Electronically Signed   By: Lowella Grip III M.D.   On: 10/08/2020 11:03        Scheduled Meds: . vitamin C  500 mg Oral Daily  . DULoxetine  60 mg Oral Daily  . enoxaparin (LOVENOX) injection  60 mg Subcutaneous Q24H  . Ipratropium-Albuterol  1 puff Inhalation Q6H  . methylPREDNISolone (SOLU-MEDROL) injection  40 mg Intravenous Q12H  . pantoprazole  40 mg Oral Daily  . sodium chloride flush  3 mL Intravenous Q12H  . zinc sulfate  220 mg Oral Daily   Continuous Infusions: . remdesivir 100 mg in NS 100 mL 100 mg (10/09/20 1013)     LOS: 1 day    Time spent:40 min    Joshwa Hemric, Geraldo Docker, MD [facility] Pager [phone]  If 7PM-7AM, please contact night-coverage [email or web address] Password [facility] 10/09/2020, 6:04 PM

## 2020-10-10 DIAGNOSIS — R0602 Shortness of breath: Secondary | ICD-10-CM

## 2020-10-10 LAB — CBC WITH DIFFERENTIAL/PLATELET
Abs Immature Granulocytes: 0.1 10*3/uL — ABNORMAL HIGH (ref 0.00–0.07)
Basophils Absolute: 0 10*3/uL (ref 0.0–0.1)
Basophils Relative: 0 %
Eosinophils Absolute: 0 10*3/uL (ref 0.0–0.5)
Eosinophils Relative: 0 %
HCT: 38.8 % (ref 36.0–46.0)
Hemoglobin: 13.3 g/dL (ref 12.0–15.0)
Immature Granulocytes: 1 %
Lymphocytes Relative: 17 %
Lymphs Abs: 1.4 10*3/uL (ref 0.7–4.0)
MCH: 31.8 pg (ref 26.0–34.0)
MCHC: 34.3 g/dL (ref 30.0–36.0)
MCV: 92.8 fL (ref 80.0–100.0)
Monocytes Absolute: 0.3 10*3/uL (ref 0.1–1.0)
Monocytes Relative: 4 %
Neutro Abs: 6.4 10*3/uL (ref 1.7–7.7)
Neutrophils Relative %: 78 %
Platelets: 314 10*3/uL (ref 150–400)
RBC: 4.18 MIL/uL (ref 3.87–5.11)
RDW: 12.6 % (ref 11.5–15.5)
WBC: 8.2 10*3/uL (ref 4.0–10.5)
nRBC: 0 % (ref 0.0–0.2)

## 2020-10-10 LAB — COMPREHENSIVE METABOLIC PANEL
ALT: 49 U/L — ABNORMAL HIGH (ref 0–44)
AST: 28 U/L (ref 15–41)
Albumin: 2.8 g/dL — ABNORMAL LOW (ref 3.5–5.0)
Alkaline Phosphatase: 83 U/L (ref 38–126)
Anion gap: 12 (ref 5–15)
BUN: 38 mg/dL — ABNORMAL HIGH (ref 8–23)
CO2: 25 mmol/L (ref 22–32)
Calcium: 8.8 mg/dL — ABNORMAL LOW (ref 8.9–10.3)
Chloride: 100 mmol/L (ref 98–111)
Creatinine, Ser: 0.98 mg/dL (ref 0.44–1.00)
GFR, Estimated: 58 mL/min — ABNORMAL LOW (ref >60)
Glucose, Bld: 184 mg/dL — ABNORMAL HIGH (ref 70–99)
Potassium: 3.8 mmol/L (ref 3.5–5.1)
Sodium: 137 mmol/L (ref 135–145)
Total Bilirubin: 0.6 mg/dL (ref 0.3–1.2)
Total Protein: 6.2 g/dL — ABNORMAL LOW (ref 6.5–8.1)

## 2020-10-10 LAB — TROPONIN I (HIGH SENSITIVITY): Troponin I (High Sensitivity): 4 ng/L (ref <18)

## 2020-10-10 LAB — HEPATITIS PANEL, ACUTE
HCV Ab: NONREACTIVE
Hep A IgM: NONREACTIVE
Hep B C IgM: NONREACTIVE
Hepatitis B Surface Ag: NONREACTIVE

## 2020-10-10 LAB — LACTATE DEHYDROGENASE: LDH: 173 U/L (ref 98–192)

## 2020-10-10 LAB — FERRITIN: Ferritin: 409 ng/mL — ABNORMAL HIGH (ref 11–307)

## 2020-10-10 LAB — C-REACTIVE PROTEIN: CRP: 7.1 mg/dL — ABNORMAL HIGH (ref <1.0)

## 2020-10-10 LAB — PHOSPHORUS: Phosphorus: 3.9 mg/dL (ref 2.5–4.6)

## 2020-10-10 LAB — MAGNESIUM: Magnesium: 1.8 mg/dL (ref 1.7–2.4)

## 2020-10-10 LAB — D-DIMER, QUANTITATIVE: D-Dimer, Quant: 0.38 ug/mL-FEU (ref 0.00–0.50)

## 2020-10-10 MED ORDER — ALUM & MAG HYDROXIDE-SIMETH 200-200-20 MG/5ML PO SUSP: 30.0000 mL | ORAL | Status: DC | PRN

## 2020-10-10 MED ORDER — ALUM & MAG HYDROXIDE-SIMETH 200-200-20 MG/5ML PO SUSP: 30.0000 mL | Freq: Once | ORAL | Status: AC

## 2020-10-10 NOTE — Progress Notes (Signed)
PROGRESS NOTE    Jordan [name]  ATF:[id number] DOB: 05-29-1951 DOA: 10/08/2020 PCP: [name], NP     Brief Narrative:  Jordan [name] is a 69 y.o. WF PMHx Depression, HTN, HLD, osteoarthritis, MORBID obesity, Tobacco abuse, SBO,  who presented to [place] ED for evaluation of worsening shortness of breath.  Patient has received 2 doses of the Pfizer COVID-19 vaccine, last dose administered 08/16/2020.  She had a positive COVID-19 test on 09/28/2020 after exposure to a sick contact.  She was seen in the ED 10/01/2020 with symptoms of arthralgias, nausea, and fever.  Chest x-ray was without active disease and she was not hypoxic during that visit.  He was felt stable to discharge to home with recommendation for outpatient monoclonal antibody infusion however she declined infusion.  She says over the last few days she has been having frequent nausea without emesis.  She has had frequent nonproductive cough.  She says she becomes easily fatigued and short of breath with minimal activity.  She has been having subjective fevers.  She denies any chest pain, abdominal pain, lower extremity swelling, or dysuria.  She reports chronic loose/semisoft stools which is unchanged.  She came to the ED due to worsening symptoms.      [facility] ED Course:  Initial vitals showed BP 122/76, pulse 90, RR 24, temp 99.3 Fahrenheit, SPO2 86% on room air.  Patient was subsequently placed on 15 L HFNC with SPO2 >94%.   Subjective: 10/19 afebrile overnight, positive S OB, negative nausea, negative vomiting, negative diarrhea.  Sitting in chair comfortably   Assessment & Plan: Covid vaccination; vaccinated   Principal Problem:   Acute hypoxemic respiratory failure due to COVID-19 [facility]) Active Problems:   Depression   Essential hypertension   GERD (gastroesophageal reflux disease)   Morbidly obese (HCC)   Acute hypoxemic respiratory failure/ COVID-19 multifocal  pneumonia: COVID-19 Labs  Recent Labs    10/08/20 1045 10/09/20 0417 10/10/20 0411  DDIMER 0.68* 0.43 0.38  FERRITIN 422* 422* 409*  LDH 241*  --  173  CRP 19.0* 18.6* 7.1*   10/7 SARS coronavirus positive (outside facility)   -Solu-Medrol 60 mg BID -Remdesivir 5 days per pharmacy protocol -Vitamin C and zinc per Covid protocol -Respimat QID -We will discuss starting Baricitinib with patient -Flutter valve -Incentive spirometry -Titrate O2 to maintain SPO2> 88% -Prone patient 8 hours/day; if patient cannot tolerate prone to 3 hours per shift    Hypertension: -Currently controlled on no medication. -Continue to hold home triamterene-HCTZ for now.  GERD: -Protonix 40 mg daily  Depression: -Cymbalta 60 mg daily.  Morbidly obese   DVT prophylaxis: Lovenox Code Status: Full Family Communication:  Status is: Inpatient    Dispo: The patient is from: Home              Anticipated d/c is to: Home              Anticipated d/c date is: 10/23              Patient currently unstable      Consultants:    Procedures/Significant Events:    I have personally reviewed and interpreted all radiology studies and my findings are as above.  VENTILATOR SETTINGS: HFNC 10/19 Flow 8 L/min SPO2 91%   Cultures 10/7 SARS coronavirus positive (outside facility) 10/18 acute hepatitis panel pending  Antimicrobials: Anti-infectives (From admission, onward)   Start     Ordered Stop  10/09/20 1000  remdesivir 100 mg in sodium chloride 0.9 % 100 mL IVPB        10/08/20 1048 10/13/20 0959   10/08/20 1130  remdesivir 100 mg in sodium chloride 0.9 % 100 mL IVPB       "Followed by" Linked Group Details   10/08/20 1048 10/08/20 1252   10/08/20 1100  remdesivir 100 mg in sodium chloride 0.9 % 100 mL IVPB       "Followed by" Linked Group Details   10/08/20 1048 10/08/20 1141       Devices    LINES / TUBES:      Continuous Infusions: . remdesivir 100 mg in NS  100 mL 100 mg (10/10/20 0905)     Objective: Vitals:   10/09/20 1414 10/09/20 1945 10/10/20 0337 10/10/20 0855  BP: 132/78 133/83 122/73   Pulse: 78 74 64   Resp: 20 18 18    Temp: 98.2 F (36.8 C) 98.2 F (36.8 C) 98.9 F (37.2 C)   TempSrc: Oral Oral Oral   SpO2: 93% 93% 96% 91%  Weight:      Height:        Intake/Output Summary (Last 24 hours) at 10/10/2020 1028 Last data filed at 10/09/2020 2129 Gross per 24 hour  Intake 533 ml  Output --  Net 533 ml   Filed Weights   10/08/20 1821  Weight: 115.5 kg   Physical Exam:  General: A/O x4, positive acute respiratory distress Eyes: negative scleral hemorrhage, negative anisocoria, negative icterus ENT: Negative Runny nose, negative gingival bleeding, Neck:  Negative scars, masses, torticollis, lymphadenopathy, JVD Lungs: decreased breath sounds bilaterally without wheezes or crackles Cardiovascular: Regular rate and rhythm without murmur gallop or rub normal S1 and S2 Abdomen: MORBIDLY OBESE, negative abdominal pain, nondistended, positive soft, bowel sounds, no rebound, no ascites, no appreciable mass Extremities: No significant cyanosis, clubbing, or edema bilateral lower extremities Skin: Negative rashes, lesions, ulcers Psychiatric:  Negative depression, negative anxiety, negative fatigue, negative mania  Central nervous system:  Cranial nerves II through XII intact, tongue/uvula midline, all extremities muscle strength 5/5, sensation intact throughout,negative dysarthria, negative expressive aphasia, negative receptive aphasia.      Data Reviewed: Care during the described time interval was provided by me .  I have reviewed this patient's available data, including medical history, events of note, physical examination, and all test results as part of my evaluation.  CBC: Recent Labs  Lab 10/08/20 1045 10/09/20 0417 10/10/20 0411  WBC 8.2 5.0 8.2  NEUTROABS 5.8 3.5 6.4  HGB 14.8 13.8 13.3  HCT 43.4 40.3 38.8   MCV 91.6 92.2 92.8  PLT 291 289 703   Basic Metabolic Panel: Recent Labs  Lab 10/08/20 1045 10/09/20 0417 10/10/20 0411  NA 136 138 137  K 3.7 3.7 3.8  CL 101 101 100  CO2 21* 25 25  GLUCOSE 170* 194* 184*  BUN 32* 33* 38*  CREATININE 1.19* 0.93 0.98  CALCIUM 8.8* 8.9 8.8*  MG  --  2.0 1.8  PHOS  --  3.7 3.9   GFR: Estimated Creatinine Clearance: 66.6 mL/min (by C-G formula based on SCr of 0.98 mg/dL). Liver Function Tests: Recent Labs  Lab 10/08/20 1045 10/09/20 0417 10/10/20 0411  AST 35 27 28  ALT 54* 47* 49*  ALKPHOS 97 102 83  BILITOT 0.6 0.8 0.6  PROT 6.4* 6.7 6.2*  ALBUMIN 3.1* 3.2* 2.8*   No results for input(s): LIPASE, AMYLASE in the last 168 hours. No results  for input(s): AMMONIA in the last 168 hours. Coagulation Profile: No results for input(s): INR, PROTIME in the last 168 hours. Cardiac Enzymes: No results for input(s): CKTOTAL, CKMB, CKMBINDEX, TROPONINI in the last 168 hours. BNP (last 3 results) No results for input(s): PROBNP in the last 8760 hours. HbA1C: No results for input(s): HGBA1C in the last 72 hours. CBG: No results for input(s): GLUCAP in the last 168 hours. Lipid Profile: Recent Labs    10/08/20 1045  TRIG 105   Thyroid Function Tests: No results for input(s): TSH, T4TOTAL, FREET4, T3FREE, THYROIDAB in the last 72 hours. Anemia Panel: Recent Labs    10/09/20 0417 10/10/20 0411  FERRITIN 422* 409*   Sepsis Labs: Recent Labs  Lab 10/08/20 1045 10/08/20 1103 10/08/20 1301  PROCALCITON <0.10  --   --   LATICACIDVEN  --  2.3* 1.3    Recent Results (from the past 240 hour(s))  Blood Culture (routine x 2)     Status: None (Preliminary result)   Collection Time: 10/08/20 10:25 AM   Specimen: Right Antecubital; Blood  Result Value Ref Range Status   Specimen Description   Final    RIGHT ANTECUBITAL Performed at [facility], [address] [place], [place] [address]    Special Requests   Final     BOTTLES DRAWN AEROBIC AND ANAEROBIC Blood Culture adequate volume Performed at [facility], [address] [place], [place] [address]    Culture   Final    NO GROWTH 2 DAYS Performed at [facility], [place] [address] [place], [place] [address]    Report Status PENDING  Incomplete         Radiology Studies: CT Angio Chest PE W and/or Wo Contrast  Result Date: 10/08/2020 CLINICAL DATA:  Shortness of breath. COVID-19 positive. Positive D-dimer EXAM: CT ANGIOGRAPHY CHEST WITH CONTRAST TECHNIQUE: Multidetector CT imaging of the chest was performed using the standard protocol during bolus administration of intravenous contrast. Multiplanar CT image reconstructions and MIPs were obtained to evaluate the vascular anatomy. CONTRAST:  30mL OMNIPAQUE IOHEXOL 350 MG/ML SOLN COMPARISON:  Chest radiograph October 08, 2020 FINDINGS: Cardiovascular: There is no evident pulmonary embolus. There is no thoracic aortic aneurysm or dissection. There is calcification at the origin of the left subclavian artery. Visualized great vessels otherwise appear unremarkable. There are foci of aortic atherosclerosis. There are scattered foci of coronary artery calcification. There is no pericardial effusion or pericardial thickening. Mediastinum/Nodes: There is a 1.0 cm nodular opacity in the left lobe of the thyroid. Thyroid otherwise appears unremarkable. There is no appreciable thoracic adenopathy. No esophageal lesions are evident. Lungs/Pleura: There is airspace opacity scattered throughout the lungs bilaterally without appreciable consolidation. No pleural effusions are appreciable. No areas of cavitation noted. Upper Abdomen: There is hepatic steatosis. There is upper abdominal aortic atherosclerosis. There is mild left adrenal hypertrophy bilaterally. Visualized upper abdominal structures otherwise appear unremarkable. Musculoskeletal: There is elevation of the right hemidiaphragm. There is degenerative  change in the thoracic spine. There are no blastic or lytic bone lesions. No evident chest wall lesions. Review of the MIP images confirms the above findings. IMPRESSION: 1. No demonstrable pulmonary embolus. No thoracic aortic aneurysm or dissection. There are foci of aortic atherosclerosis as well as foci great vessel and coronary artery calcification. 2. Multifocal airspace opacity consistent with multifocal pneumonia, likely of atypical organism etiology. No consolidation. No pleural effusions. 3.  No evident adenopathy. 4.  Hepatic steatosis.  Aortic Atherosclerosis (ICD10-I70.0). Electronically Signed   By: Lowella Grip III M.D.   On: 10/08/2020 12:50   DG Chest Portable 1 View  Result Date: 10/08/2020 CLINICAL DATA:  Shortness of breath.  COVID-19 positive EXAM: PORTABLE CHEST 1 VIEW COMPARISON:  October 01, 2020 FINDINGS: There is slight bibasilar atelectasis. The lungs elsewhere are clear. Heart is upper normal in size with pulmonary vascularity normal. No adenopathy. No bone lesions. IMPRESSION: Slight bibasilar atelectasis. Lungs elsewhere clear. Stable cardiac silhouette. No adenopathy evident. Electronically Signed   By: Lowella Grip III M.D.   On: 10/08/2020 11:03        Scheduled Meds: . vitamin C  500 mg Oral Daily  . DULoxetine  60 mg Oral Daily  . enoxaparin (LOVENOX) injection  60 mg Subcutaneous Q24H  . Ipratropium-Albuterol  1 puff Inhalation Q6H  . methylPREDNISolone (SOLU-MEDROL) injection  40 mg Intravenous Q12H  . pantoprazole  40 mg Oral Daily  . sodium chloride flush  3 mL Intravenous Q12H  . zinc sulfate  220 mg Oral Daily   Continuous Infusions: . remdesivir 100 mg in NS 100 mL 100 mg (10/10/20 0905)     LOS: 2 days    Time spent:40 min    Gladyse Corvin, Geraldo Docker, MD [facility] Pager [phone]  If 7PM-7AM, please contact night-coverage [email or web address] Password [email or web address] 10/10/2020, 10:28 AM

## 2020-10-10 NOTE — Progress Notes (Signed)
Pt c/o new onset chest pain while on phone with son.  Vitals obtained, with slightly elevated BP.  MD on call consulted, and EKG/labs obtained.  EKG showed NSR and chest discomfort resolved. Pt resting comfortably and family notified of results.  Will continue to monitor.

## 2020-10-11 LAB — C-REACTIVE PROTEIN: CRP: 2.4 mg/dL — ABNORMAL HIGH (ref <1.0)

## 2020-10-11 LAB — CBC WITH DIFFERENTIAL/PLATELET
Abs Immature Granulocytes: 0.14 10*3/uL — ABNORMAL HIGH (ref 0.00–0.07)
Basophils Absolute: 0 10*3/uL (ref 0.0–0.1)
Basophils Relative: 0 %
Eosinophils Absolute: 0 10*3/uL (ref 0.0–0.5)
Eosinophils Relative: 0 %
HCT: 39.1 % (ref 36.0–46.0)
Hemoglobin: 13.4 g/dL (ref 12.0–15.0)
Immature Granulocytes: 2 %
Lymphocytes Relative: 19 %
Lymphs Abs: 1.5 10*3/uL (ref 0.7–4.0)
MCH: 31.4 pg (ref 26.0–34.0)
MCHC: 34.3 g/dL (ref 30.0–36.0)
MCV: 91.6 fL (ref 80.0–100.0)
Monocytes Absolute: 0.3 10*3/uL (ref 0.1–1.0)
Monocytes Relative: 3 %
Neutro Abs: 6.1 10*3/uL (ref 1.7–7.7)
Neutrophils Relative %: 76 %
Platelets: 322 10*3/uL (ref 150–400)
RBC: 4.27 MIL/uL (ref 3.87–5.11)
RDW: 12.5 % (ref 11.5–15.5)
WBC: 8.1 10*3/uL (ref 4.0–10.5)
nRBC: 0 % (ref 0.0–0.2)

## 2020-10-11 LAB — COMPREHENSIVE METABOLIC PANEL
ALT: 58 U/L — ABNORMAL HIGH (ref 0–44)
AST: 34 U/L (ref 15–41)
Albumin: 2.9 g/dL — ABNORMAL LOW (ref 3.5–5.0)
Alkaline Phosphatase: 82 U/L (ref 38–126)
Anion gap: 10 (ref 5–15)
BUN: 35 mg/dL — ABNORMAL HIGH (ref 8–23)
CO2: 24 mmol/L (ref 22–32)
Calcium: 8.6 mg/dL — ABNORMAL LOW (ref 8.9–10.3)
Chloride: 102 mmol/L (ref 98–111)
Creatinine, Ser: 0.83 mg/dL (ref 0.44–1.00)
GFR, Estimated: >60 mL/min (ref >60)
Glucose, Bld: 177 mg/dL — ABNORMAL HIGH (ref 70–99)
Potassium: 4.1 mmol/L (ref 3.5–5.1)
Sodium: 136 mmol/L (ref 135–145)
Total Bilirubin: 0.6 mg/dL (ref 0.3–1.2)
Total Protein: 6 g/dL — ABNORMAL LOW (ref 6.5–8.1)

## 2020-10-11 LAB — D-DIMER, QUANTITATIVE: D-Dimer, Quant: <0.27 ug/mL-FEU (ref 0.00–0.50)

## 2020-10-11 LAB — TROPONIN I (HIGH SENSITIVITY): Troponin I (High Sensitivity): 5 ng/L (ref <18)

## 2020-10-11 LAB — FERRITIN: Ferritin: 365 ng/mL — ABNORMAL HIGH (ref 11–307)

## 2020-10-11 LAB — MAGNESIUM: Magnesium: 2 mg/dL (ref 1.7–2.4)

## 2020-10-11 LAB — PHOSPHORUS: Phosphorus: 3.7 mg/dL (ref 2.5–4.6)

## 2020-10-11 LAB — LACTATE DEHYDROGENASE: LDH: 155 U/L (ref 98–192)

## 2020-10-11 MED ORDER — IPRATROPIUM-ALBUTEROL 20-100 MCG/ACT IN AERS
1.0000 | INHALATION_SPRAY | Freq: Three times a day (TID) | RESPIRATORY_TRACT | Status: DC
  Administered 2020-10-11 – 2020-10-13 (×7): 1 via RESPIRATORY_TRACT
  Filled 2020-10-11: qty 4

## 2020-10-11 NOTE — Progress Notes (Signed)
PROGRESS NOTE    Jordan [name]  MOQ:[id number] DOB: 1951-07-16 DOA: 10/08/2020 PCP: [name], NP    Brief Narrative:  [name] with [facility] working diagnosis of acute hypoxic respiratory failure due to SARS COVID-19 viral pneumonia.  69 year old female with past medical history for hypertension, dyslipidemia, osteoarthritis, depression, obesity and tobacco abuse who presented with worsening dyspnea.  [name] vaccinated for COVID-19, last dose 08/16/2020.  Tested positive for COVID-19 10/7 after positive sick contact.  Her symptoms were consistent with arthralgias, nausea and fever.  Initially seen in [facility] ED 10/01/2020 and discharge home. She continued to have nausea, cough easy fatigability and dyspnea with minimal efforts.  She returned to be ED on 10/17, her blood pressure was 122/76, heart rate 90, respiratory rate 24, temperature 99.3, oxygen saturation 86% on room air.  Her lungs had bibasilar Rales, no wheezing, heart S1-S2, present rhythm, soft abdomen, no lower extremity edema. Chest radiograph with faint peripheral bilateral interstitial infiltrates.  [name] has been placed on medical therapy with high-dose systemic steroids, remdesivir and supplemental oxygen.    Assessment & Plan:   Principal Problem:   Acute hypoxemic respiratory failure due to COVID-19 [facility]) Active Problems:   Depression   Essential hypertension   GERD (gastroesophageal reflux disease)   Morbidly obese ([place])   1. Acute hypoxic respiratory failure due to SARS COVID 19 viral pneumonia.   RR: 19  Pulse oxymetry: 96% Fi02: 8 L/ HFNC   COVID-19 Labs  Recent Labs    10/09/20 0417 10/10/20 0411 10/11/20 0447  DDIMER 0.43 0.38 <0.27  FERRITIN 422* 409* 365*  LDH  --  173 155  CRP 18.6* 7.1* 2.4*    No results found for: SARSCOV2NAA  Inflammatory markers are trending down, [name] with improved appetite and energy. Out of bed to chair and doing incentive  spirometer and flutter valve.   Continue medical therapy with systemic steroids and remdesivir #4/5. Continue antitussive agents and bronchodilators.   Continue to follow on inflammatory markers, keep oxygenation more than 88% and consult PT/OT, out of bed to chair tid with meals.   2. HTN. Blood pressure systolic 354 to 656 mmHg, continue to hold on antihypertensive medications.   3. GERD. On pantoprazole, tolerating po well,.   4. Depression. Continue with duloxetine.   5. Obesity class 3. Calculated BMI is 43,6   [name] continue to be at high risk for worsening respiratory failure.   Status is: Inpatient  Remains inpatient appropriate because:IV treatments appropriate due to intensity of illness or inability to take PO   Dispo: Jordan [name] is from: Home              Anticipated d/c is to: Home              Anticipated d/c date is: 2 days              [name] currently is not medically stable to d/c.   DVT prophylaxis: Enoxaparin   Code Status:   full  Family Communication:  I spoke over Jordan phone with Jordan [name]'s son about [name]'s  condition, plan of care, prognosis and all questions were addressed.    Subjective: [name] is feeling better, dyspnea is improving along with appetite and energy, no nausea or vomiting, no chest pain.   Objective: Vitals:   10/10/20 0855 10/10/20 1321 10/10/20 2046 10/11/20 0437  BP:  111/69 (!) 141/84 108/70  Pulse:  68 74 62  Resp:  18  20 19  Temp:  98 F (36.7 C) 98.4 F (36.9 C) 98 F (36.7 C)  TempSrc:  Oral    SpO2: 91% 96% 94% 96%  Weight:      Height:        Intake/Output Summary (Last 24 hours) at 10/11/2020 1046 Last data filed at 10/11/2020 1044 Gross per 24 hour  Intake 606.97 ml  Output --  Net 606.97 ml   Filed Weights   10/08/20 1821  Weight: 115.5 kg    Examination:   General: Not in pain or dyspnea, deconditioned  Neurology: Awake and alert, non focal  E ENT: no pallor, no icterus, oral mucosa  moist Cardiovascular: No JVD. S1-S2 present, rhythmic, no gallops, rubs, or murmurs. No lower extremity edema. Pulmonary: positive breath sounds bilaterally, with no wheezing, rhonchi or rales. Gastrointestinal. Abdomen soft and non tender Skin. No rashes Musculoskeletal: no joint deformities     Data Reviewed: I have personally reviewed following labs and imaging studies  CBC: Recent Labs  Lab 10/08/20 1045 10/09/20 0417 10/10/20 0411 10/11/20 0447  WBC 8.2 5.0 8.2 8.1  NEUTROABS 5.8 3.5 6.4 6.1  HGB 14.8 13.8 13.3 13.4  HCT 43.4 40.3 38.8 39.1  MCV 91.6 92.2 92.8 91.6  PLT 291 289 314 588   Basic Metabolic Panel: Recent Labs  Lab 10/08/20 1045 10/09/20 0417 10/10/20 0411 10/11/20 0447  NA 136 138 137 136  K 3.7 3.7 3.8 4.1  CL 101 101 100 102  CO2 21* 25 25 24   GLUCOSE 170* 194* 184* 177*  BUN 32* 33* 38* 35*  CREATININE 1.19* 0.93 0.98 0.83  CALCIUM 8.8* 8.9 8.8* 8.6*  MG  --  2.0 1.8 2.0  PHOS  --  3.7 3.9 3.7   GFR: Estimated Creatinine Clearance: 78.7 mL/min (by C-G formula based on SCr of 0.83 mg/dL). Liver Function Tests: Recent Labs  Lab 10/08/20 1045 10/09/20 0417 10/10/20 0411 10/11/20 0447  AST 35 27 28 34  ALT 54* 47* 49* 58*  ALKPHOS 97 102 83 82  BILITOT 0.6 0.8 0.6 0.6  PROT 6.4* 6.7 6.2* 6.0*  ALBUMIN 3.1* 3.2* 2.8* 2.9*   No results for input(s): LIPASE, AMYLASE in [place] last 168 hours. No results for input(s): AMMONIA in [place] last 168 hours. Coagulation Profile: No results for input(s): INR, PROTIME in [place] last 168 hours. Cardiac Enzymes: No results for input(s): CKTOTAL, CKMB, CKMBINDEX, TROPONINI in [place] last 168 hours. BNP (last 3 results) No results for input(s): PROBNP in [place] last 8760 hours. HbA1C: No results for input(s): HGBA1C in Jordan last 72 hours. CBG: No results for input(s): GLUCAP in [place] last 168 hours. Lipid Profile: No results for input(s): CHOL, HDL, LDLCALC, TRIG, CHOLHDL, LDLDIRECT in Jordan last 72 hours. Thyroid  Function Tests: No results for input(s): TSH, T4TOTAL, FREET4, T3FREE, THYROIDAB in [place] last 72 hours. Anemia Panel: Recent Labs    10/10/20 0411 10/11/20 0447  FERRITIN 409* 365*      Radiology Studies: I have reviewed all of Jordan imaging during this hospital visit personally     Scheduled Meds: . vitamin C  500 mg Oral Daily  . DULoxetine  60 mg Oral Daily  . enoxaparin (LOVENOX) injection  60 mg Subcutaneous Q24H  . Ipratropium-Albuterol  1 puff Inhalation TID  . methylPREDNISolone (SOLU-MEDROL) injection  40 mg Intravenous Q12H  . pantoprazole  40 mg Oral Daily  . sodium chloride flush  3 mL Intravenous Q12H  . zinc sulfate  220 mg  Oral Daily   Continuous Infusions: . remdesivir 100 mg in NS 100 mL 100 mg (10/11/20 0959)     LOS: 3 days        Nelda Luckey Gerome Apley, MD

## 2020-10-11 NOTE — Progress Notes (Signed)
Decreased patient supplemental oxygen from 8L to 6L and she is maintaining O2 sats around 92-94%. Will continue to monitor.

## 2020-10-11 NOTE — Progress Notes (Signed)
Decreased patient supplemental oxygen from 6L to 5L. Her O2 sats are 96% at this time. Will continue to monitor.

## 2020-10-11 NOTE — Care Management Important Message (Signed)
Important Message  Patient Details IM Letter given to the Patient Name: Jordan [name] MRN: [id number] Date of Birth: 01-16-1951   Medicare Important Message Given:  Yes     Kerin Salen 10/11/2020, 10:56 AM

## 2020-10-12 LAB — COMPREHENSIVE METABOLIC PANEL
ALT: 90 U/L — ABNORMAL HIGH (ref 0–44)
AST: 45 U/L — ABNORMAL HIGH (ref 15–41)
Albumin: 2.9 g/dL — ABNORMAL LOW (ref 3.5–5.0)
Alkaline Phosphatase: 70 U/L (ref 38–126)
Anion gap: 10 (ref 5–15)
BUN: 31 mg/dL — ABNORMAL HIGH (ref 8–23)
CO2: 24 mmol/L (ref 22–32)
Calcium: 8.5 mg/dL — ABNORMAL LOW (ref 8.9–10.3)
Chloride: 101 mmol/L (ref 98–111)
Creatinine, Ser: 0.83 mg/dL (ref 0.44–1.00)
GFR, Estimated: >60 mL/min (ref >60)
Glucose, Bld: 184 mg/dL — ABNORMAL HIGH (ref 70–99)
Potassium: 4.4 mmol/L (ref 3.5–5.1)
Sodium: 135 mmol/L (ref 135–145)
Total Bilirubin: 0.8 mg/dL (ref 0.3–1.2)
Total Protein: 5.8 g/dL — ABNORMAL LOW (ref 6.5–8.1)

## 2020-10-12 LAB — FERRITIN: Ferritin: 308 ng/mL — ABNORMAL HIGH (ref 11–307)

## 2020-10-12 LAB — C-REACTIVE PROTEIN: CRP: 0.9 mg/dL (ref <1.0)

## 2020-10-12 LAB — D-DIMER, QUANTITATIVE: D-Dimer, Quant: 0.33 ug/mL-FEU (ref 0.00–0.50)

## 2020-10-12 MED ORDER — METHYLPREDNISOLONE SODIUM SUCC 40 MG IJ SOLR
40.0000 mg | Freq: Every day | INTRAMUSCULAR | Status: DC
  Administered 2020-10-13: 40 mg via INTRAVENOUS
  Filled 2020-10-12: qty 1

## 2020-10-12 NOTE — Progress Notes (Signed)
PROGRESS NOTE    Jordan [name]  OHY:[id number] DOB: [birth year] DOA: 10/08/2020 PCP: [name], NP    Brief Narrative:  Jordan [name] was admitted to the hospital with the working diagnosis of acute hypoxic respiratory failure due to SARS COVID-19 viral pneumonia.  69 year old female with past medical history for hypertension, dyslipidemia, osteoarthritis, depression, obesity and tobacco abuse who presented with worsening dyspnea.  Patient vaccinated for COVID-19, last dose 08/16/2020.  Tested positive for COVID-19 10/7 after positive sick contact.  Her symptoms were consistent with arthralgias, nausea and fever.  Initially seen in the ED 10/01/2020 and discharge home. She continued to have nausea, cough easy fatigability and dyspnea with minimal efforts.  She returned to be ED on 10/17, her blood pressure was 122/76, heart rate 90, respiratory rate 24, temperature 99.3, oxygen saturation 86% on room air.  Her lungs had bibasilar Rales, no wheezing, heart S1-S2, present rhythm, soft abdomen, no lower extremity edema. Chest radiograph with faint peripheral bilateral interstitial infiltrates.  Patient has been placed on medical therapy with high-dose systemic steroids, remdesivir and supplemental oxygen.  She has been responding well to medical therapy with improvement in her symptoms, inflammatory markers and oxygen requirements.    Assessment & Plan:   Principal Problem:   Acute hypoxemic respiratory failure due to COVID-19 [facility]) Active Problems:   Depression   Essential hypertension   GERD (gastroesophageal reflux disease)   Morbidly obese ([place])   1. Acute hypoxic respiratory failure due to SARS COVID 19 viral pneumonia  RR: 15 Pulse oxymetry: 89% to 90%  Fi02: 4 L/ min per HFNC  COVID-19 Labs  Recent Labs    10/10/20 0411 10/11/20 0447 10/12/20 0421  DDIMER 0.38 <0.27 0.33  FERRITIN 409* 365* 308*  LDH 173 155  --   CRP 7.1* 2.4* 0.9    No results found for:  SARSCOV2NAA  Today she is out of bed to chair, continue to improve dyspnea. Inflammatory markers are trending down.   Tolerating well systemic steroids (decrease dose to 40 mg daily) and today compeltes remdesivir #5/5. On antitussive agents, airway claering techniques and bronchodilators.   Pending PT and OT evaluation. Check ambulatory oxymetry on room air in preparation for discharge home in am.   2. HTN. Continue blood pressure monitoring, it has been well controlled, 121/55 mmHg.   3. GERD. Continue with pantoprazole.   4. Depression. On duloxetine.   5. Obesity class 3. BMI is 43,6    Status is: Inpatient  Remains inpatient appropriate because:IV treatments appropriate due to intensity of illness or inability to take PO   Dispo: The patient is from: Home              Anticipated d/c is to: Home              Anticipated d/c date is: 1 day              Patient currently is not medically stable to d/c. Plan to discharge home in am, if continue to improve.     DVT prophylaxis: Enoxaparin   Code Status:   full  Family Communication:  I spoke over the phone with the patient's son about patient's  condition, plan of care, prognosis and all questions were addressed.     Subjective: Patient is feeling better, continue to improve dyspnea but not yet back to baseline, no nausea or vomiting.   Objective: Vitals:   10/11/20 2038 10/11/20 2046 10/12/20 0350 10/12/20 0828  BP: Marland Kitchen)  146/76  (!) 121/55   Pulse: 67  60   Resp: 15  15   Temp: 98 F (36.7 C)  97.6 F (36.4 C)   TempSrc:   Oral   SpO2: 96% 90% 98% (!) 89%  Weight:      Height:        Intake/Output Summary (Last 24 hours) at 10/12/2020 0959 Last data filed at 10/12/2020 0800 Gross per 24 hour  Intake 1305 ml  Output --  Net 1305 ml   Filed Weights   10/08/20 1821  Weight: 115.5 kg    Examination:   General: Not in pain or dyspnea,  Neurology: Awake and alert, non focal  E ENT: no pallor, no  icterus, oral mucosa moist Cardiovascular: No JVD. S1-S2 present, rhythmic, no gallops, rubs, or murmurs. No lower extremity edema. Pulmonary: positive breath sounds bilaterally,  Gastrointestinal. Abdomen soft and non tender Skin. No rashes Musculoskeletal: no joint deformities     Data Reviewed: I have personally reviewed following labs and imaging studies  CBC: Recent Labs  Lab 10/08/20 1045 10/09/20 0417 10/10/20 0411 10/11/20 0447  WBC 8.2 5.0 8.2 8.1  NEUTROABS 5.8 3.5 6.4 6.1  HGB 14.8 13.8 13.3 13.4  HCT 43.4 40.3 38.8 39.1  MCV 91.6 92.2 92.8 91.6  PLT 291 289 314 341   Basic Metabolic Panel: Recent Labs  Lab 10/08/20 1045 10/09/20 0417 10/10/20 0411 10/11/20 0447 10/12/20 0421  NA 136 138 137 136 135  K 3.7 3.7 3.8 4.1 4.4  CL 101 101 100 102 101  CO2 21* 25 25 24 24   GLUCOSE 170* 194* 184* 177* 184*  BUN 32* 33* 38* 35* 31*  CREATININE 1.19* 0.93 0.98 0.83 0.83  CALCIUM 8.8* 8.9 8.8* 8.6* 8.5*  MG  --  2.0 1.8 2.0  --   PHOS  --  3.7 3.9 3.7  --    GFR: Estimated Creatinine Clearance: 78.7 mL/min (by C-G formula based on SCr of 0.83 mg/dL). Liver Function Tests: Recent Labs  Lab 10/08/20 1045 10/09/20 0417 10/10/20 0411 10/11/20 0447 10/12/20 0421  AST 35 27 28 34 45*  ALT 54* 47* 49* 58* 90*  ALKPHOS 97 102 83 82 70  BILITOT 0.6 0.8 0.6 0.6 0.8  PROT 6.4* 6.7 6.2* 6.0* 5.8*  ALBUMIN 3.1* 3.2* 2.8* 2.9* 2.9*   No results for input(s): LIPASE, AMYLASE in the last 168 hours. No results for input(s): AMMONIA in the last 168 hours. Coagulation Profile: No results for input(s): INR, PROTIME in the last 168 hours. Cardiac Enzymes: No results for input(s): CKTOTAL, CKMB, CKMBINDEX, TROPONINI in the last 168 hours. BNP (last 3 results) No results for input(s): PROBNP in the last 8760 hours. HbA1C: No results for input(s): HGBA1C in the last 72 hours. CBG: No results for input(s): GLUCAP in the last 168 hours. Lipid Profile: No results for  input(s): CHOL, HDL, LDLCALC, TRIG, CHOLHDL, LDLDIRECT in the last 72 hours. Thyroid Function Tests: No results for input(s): TSH, T4TOTAL, FREET4, T3FREE, THYROIDAB in the last 72 hours. Anemia Panel: Recent Labs    10/11/20 0447 10/12/20 0421  FERRITIN 365* 308*      Radiology Studies: I have reviewed all of the imaging during this hospital visit personally     Scheduled Meds: . vitamin C  500 mg Oral Daily  . DULoxetine  60 mg Oral Daily  . enoxaparin (LOVENOX) injection  60 mg Subcutaneous Q24H  . Ipratropium-Albuterol  1 puff Inhalation TID  . methylPREDNISolone (SOLU-MEDROL)  injection  40 mg Intravenous Q12H  . pantoprazole  40 mg Oral Daily  . sodium chloride flush  3 mL Intravenous Q12H  . zinc sulfate  220 mg Oral Daily   Continuous Infusions: . remdesivir 100 mg in NS 100 mL Stopped (10/11/20 1030)     LOS: 4 days        Jordan Gorton Gerome Apley, MD

## 2020-10-12 NOTE — Evaluation (Signed)
Physical Therapy Evaluation Patient Details Name: Jordan [name] MRN: [id number] DOB: 11-19-1951 Today's Date: 10/12/2020   History of Present Illness  69 y.o. female with PMH significant for HTN, HLD, osteoarthritis, depression, obesity, and tobacco use with c/o worsening shortness of breath.  Clinical Impression  Pt admitted with above diagnosis. Pt slightly below baseline, desat to 85% on RA with exercises and ambulation in room. Pt able to ambulate to restroom and complete toileting independently while therapist in room. PTA pt living in 2 story home, but only living on main level, very active with family activities and traveling. Pt currently not requiring physical assist, but does fatigue easily requiring seated rest breaks and pursed lip breathing cues. Educated pt on prone or sidelying when in bed and spirometer/flutter valve usage with pt verbalizing understanding and compliance. Returned 4L O2 at EOS and notified RN of SpO2 during session. Pt currently with functional limitations due to the deficits listed below (see PT Problem List). Pt will benefit from skilled PT to increase their independence and safety with mobility to allow discharge to the venue listed below.       Follow Up Recommendations No PT follow up    Equipment Recommendations  None recommended by PT    Recommendations for Other Services       Precautions / Restrictions Precautions Precautions: Other (comment) Precaution Comments: monitor O2 Restrictions Weight Bearing Restrictions: No      Mobility  Bed Mobility Overal bed mobility: Modified Independent  General bed mobility comments: light use of bedrail to come to sitting at EOB    Transfers Overall transfer level: Independent Equipment used: None  General transfer comment: able to power up with minimal use of BUE, no unsteadiness noted, slightly wide BOS  Ambulation/Gait Ambulation/Gait assistance: Supervision Gait Distance (Feet): 30  Feet Assistive device: None Gait Pattern/deviations: WFL(Within Functional Limits) Gait velocity: decreased   General Gait Details: slightly wide BOS, ambulates throughout room reaching onto furniture when passing, no overt LOB noted  Stairs            Wheelchair Mobility    Modified Rankin (Stroke Patients Only)       Balance Overall balance assessment: No apparent balance deficits (not formally assessed)          Pertinent Vitals/Pain Pain Assessment: No/denies pain    Home Living Family/patient expects to be discharged to:: Private residence Living Arrangements: Alone Available Help at Discharge: Friend(s) (roommate) Type of Home: House Home Access: Stairs to enter Entrance Stairs-Rails: Right;Left;Can reach both Entrance Stairs-Number of Steps: 4 Home Layout: Two level;Able to live on main level with bedroom/bathroom Home Equipment: None (has sisters old shower chair that has been using since covid+)      Prior Function Level of Independence: Independent         Comments: Pt reports independent with community ambulation, ADLs, drives, 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     Hand Dominance   Dominant Hand: Right    Extremity/Trunk Assessment   Upper Extremity Assessment Upper Extremity Assessment: Overall WFL for tasks assessed    Lower Extremity Assessment Lower Extremity Assessment: Overall WFL for tasks assessed    Cervical / Trunk Assessment Cervical / Trunk Assessment: Normal  Communication   Communication: No difficulties  Cognition Arousal/Alertness: Awake/alert Behavior During Therapy: WFL for tasks assessed/performed Overall Cognitive Status: Within Functional Limits for tasks assessed         General Comments General comments (skin integrity, edema, etc.): on 4L SpO2 95%  at rest; put on RA with exercise and ambulation with varying SpO2 85-90%, so returned 4L with SpO2 90% at EOS    Exercises General Exercises -  Lower Extremity Long Arc Quad: Seated;AROM;Strengthening;Both;15 reps Hip Flexion/Marching: Seated;AROM;Strengthening;Both;15 reps Other Exercises Other Exercises: STS, 10 reps, light UE assist to power up and no AD   Assessment/Plan    PT Assessment Patient needs continued PT services  PT Problem List Decreased activity tolerance;Decreased knowledge of use of DME;Cardiopulmonary status limiting activity;Obesity       PT Treatment Interventions DME instruction;Gait training;Stair training;Functional mobility training;Therapeutic activities;Therapeutic exercise;Neuromuscular re-education;Patient/family education    PT Goals (Current goals can be found in the Care Plan section)  Acute Rehab PT Goals Patient Stated Goal: "get rid of the oxygen" PT Goal Formulation: With patient Time For Goal Achievement: 10/19/20 Potential to Achieve Goals: Good    Frequency Min 2X/week   Barriers to discharge        Co-evaluation               AM-PAC PT "6 Clicks" Mobility  Outcome Measure Help needed turning from your back to your side while in a flat bed without using bedrails?: None Help needed moving from lying on your back to sitting on the side of a flat bed without using bedrails?: None Help needed moving to and from a bed to a chair (including a wheelchair)?: None Help needed standing up from a chair using your arms (e.g., wheelchair or bedside chair)?: None Help needed to walk in hospital room?: None Help needed climbing 3-5 steps with a railing? : None 6 Click Score: 24    End of Session Equipment Utilized During Treatment: Oxygen Activity Tolerance: Patient tolerated treatment well Patient left: in chair;with call bell/phone within reach Nurse Communication: Mobility status;Other (comment) (O2) PT Visit Diagnosis: Other abnormalities of gait and mobility (R26.89)    Time: 2500-3704 PT Time Calculation (min) (ACUTE ONLY): 20 min   Charges:   PT Evaluation $PT Eval  Low Complexity: 1 Low           Tori Elah Avellino PT, DPT 10/12/20, 3:02 PM

## 2020-10-13 ENCOUNTER — Other Ambulatory Visit (HOSPITAL_COMMUNITY): Payer: Self-pay | Admitting: Internal Medicine

## 2020-10-13 DIAGNOSIS — U071 COVID-19: Secondary | ICD-10-CM | POA: Diagnosis not present

## 2020-10-13 LAB — COMPREHENSIVE METABOLIC PANEL
ALT: 71 U/L — ABNORMAL HIGH (ref 0–44)
AST: 25 U/L (ref 15–41)
Albumin: 2.9 g/dL — ABNORMAL LOW (ref 3.5–5.0)
Alkaline Phosphatase: 69 U/L (ref 38–126)
Anion gap: 9 (ref 5–15)
BUN: 33 mg/dL — ABNORMAL HIGH (ref 8–23)
CO2: 25 mmol/L (ref 22–32)
Calcium: 8.5 mg/dL — ABNORMAL LOW (ref 8.9–10.3)
Chloride: 104 mmol/L (ref 98–111)
Creatinine, Ser: 1.01 mg/dL — ABNORMAL HIGH (ref 0.44–1.00)
GFR, Estimated: 60 mL/min — ABNORMAL LOW (ref >60)
Glucose, Bld: 112 mg/dL — ABNORMAL HIGH (ref 70–99)
Potassium: 3.9 mmol/L (ref 3.5–5.1)
Sodium: 138 mmol/L (ref 135–145)
Total Bilirubin: 0.6 mg/dL (ref 0.3–1.2)
Total Protein: 5.6 g/dL — ABNORMAL LOW (ref 6.5–8.1)

## 2020-10-13 LAB — CULTURE, BLOOD (ROUTINE X 2)
Culture: NO GROWTH
Special Requests: ADEQUATE

## 2020-10-13 LAB — D-DIMER, QUANTITATIVE: D-Dimer, Quant: 0.35 ug{FEU}/mL (ref 0.00–0.50)

## 2020-10-13 LAB — C-REACTIVE PROTEIN: CRP: 0.6 mg/dL (ref <1.0)

## 2020-10-13 LAB — FERRITIN: Ferritin: 288 ng/mL (ref 11–307)

## 2020-10-13 MED ORDER — GUAIFENESIN-DM 100-10 MG/5ML PO SYRP: 5.0000 mL | ORAL_SOLUTION | Freq: Four times a day (QID) | ORAL | 0 refills | Status: DC | PRN

## 2020-10-13 MED ORDER — PREDNISONE 20 MG PO TABS: 20.0000 mg | ORAL_TABLET | Freq: Every day | ORAL | 0 refills | Status: DC

## 2020-10-13 MED ORDER — PREDNISONE 20 MG PO TABS: 20.0000 mg | ORAL_TABLET | Freq: Every day | ORAL | Status: DC

## 2020-10-13 MED FILL — predniSONE 20 MG TABS: 20 | 5 days supply | Qty: 5 | Fill #0

## 2020-10-13 MED FILL — SM TUSSIN DM SYRUP: 100-10 | 6 days supply | Qty: 118 | Fill #0

## 2020-10-13 NOTE — Progress Notes (Signed)
durable medical equipment Home o2    Durable Medical Equipment  (From admission, onward)         Start     Ordered   10/13/20 1042  For home use only DME oxygen  Once       Question Answer Comment  Length of Need 6 Months   Mode or (Route) Nasal cannula   Liters per Minute 5   Frequency Continuous (stationary and portable oxygen unit needed)   Oxygen conserving device Yes   Oxygen delivery system Gas      10/13/20 1042

## 2020-10-13 NOTE — Progress Notes (Signed)
SATURATION QUALIFICATIONS: (This note is used to comply with regulatory documentation for home oxygen)  Patient Saturations on Room Air at Rest = 94%  Patient Saturations on Room Air while Ambulating = 85%  Patient Saturations on 4 Liters of oxygen while Ambulating = 92%  Please briefly explain why patient needs home oxygen: Desaturation of oxygen saturation below 88% with Room Air.

## 2020-10-13 NOTE — TOC Transition Note (Signed)
Transition of Care [facility]) - CM/SW Discharge Note   Patient Details  Name: Jordan [name] MRN: [id number] Date of Birth: 1951/10/21  [facility]) CM/SW Contact:  Leeroy Cha, RN Phone Number: 10/13/2020, 10:59 AM   Clinical Narrative:    Home and travel o2 ordered through Bethanne Ginger with [facility]. At 1058.    Final next level of care: [place] Barriers to Discharge: Barriers Resolved   Patient Goals and CMS Choice Patient states their goals for this hospitalization and ongoing recovery are:: to go home CMS Medicare.gov Compare Post Acute Care list provided to:: Patient    Discharge Placement                       Discharge Plan and Services   Discharge Planning Services: CM Consult Post Acute Care Choice: Durable Medical Equipment          DME Arranged: Oxygen DME Agency: [facility] Date DME Agency Contacted: 10/13/20 Time DME Agency Contacted: [phone] Representative spoke with at DME Agency: Zack blank            Social Determinants of Health ([place]) Interventions     Readmission Risk Interventions No flowsheet data found.

## 2020-10-13 NOTE — Evaluation (Signed)
Occupational Therapy Evaluation Patient Details Name: Jordan [name] MRN: [id number] DOB: 1951-06-27 Today's Date: 10/13/2020    History of Present Illness 69 y.o. female with PMH significant for HTN, HLD, osteoarthritis, depression, obesity, and tobacco use with c/o worsening shortness of breath.   Clinical Impression   Jordan [name] is a 69 year old woman who presents on 4 L [place] normally independent and still working PRN as Network engineer. On evaluation patient demonstrates ability to perform ADLs and functional mobility with modified independence - increased time and management of [place] cord required.  Patient o2 sat 92% on 4 L [place]. With approx 20 feet of ambulation and ADL task patient dropped to 88% and required o2 to recover.Patient has no significant complaints of shortness of breath or fatigue. Patient educated on energy conservation principles to enact home for safety. Therapist recommended continued use of shower chair and use of pulse oximeter to monitor o2 sat at home. Patient verbalized understanding.  No further OT needs at this time.  Follow Up Recommendations  No OT follow up    Equipment Recommendations  None recommended by OT    Recommendations for Other Services       Precautions / Restrictions Precautions Precautions: None Restrictions Weight Bearing Restrictions: No      Mobility Bed Mobility Overal bed mobility: Modified Independent                  Transfers Overall transfer level: Independent Equipment used: None             General transfer comment: Able to perform all mobility with mod I - increased time for sit to stand and managing [place].    Balance Overall balance assessment: No apparent balance deficits (not formally assessed)                                         ADL either performed or assessed with clinical judgement   ADL Overall ADL's : Modified independent                                        General ADL Comments: Patient reports ambulating to the bathroom independently while in hospital. Patient able to donn lower body clothing with increased time and stand at sinkf or grooming. increased time for all tasks.     Vision   Vision Assessment?: No apparent visual deficits     Perception     Praxis      Pertinent Vitals/Pain Pain Assessment: No/denies pain     Hand Dominance Right   Extremity/Trunk Assessment Upper Extremity Assessment Upper Extremity Assessment: Overall WFL for tasks assessed   Lower Extremity Assessment Lower Extremity Assessment: Defer to PT evaluation   Cervical / Trunk Assessment Cervical / Trunk Assessment: Normal   Communication Communication Communication: No difficulties   Cognition Arousal/Alertness: Awake/alert Behavior During Therapy: WFL for tasks assessed/performed Overall Cognitive Status: Within Functional Limits for tasks assessed                                     General Comments       Exercises     Shoulder Instructions      Home Living Family/patient expects to be discharged  to:: Private residence Living Arrangements: Alone Available Help at Discharge: Friend(s) (roommate) Type of Home: House Home Access: Stairs to enter [employer] of Steps: 4 Entrance Stairs-Rails: Right;Left;Can reach both [place]: Two level;Able to live on main level with bedroom/bathroom Alternate Level Stairs-Number of Steps: Flight   Bathroom Shower/Tub: Teacher, early years/pre: Standard     Home Equipment: None;Shower seat (has sisters old shower chair that has been using since covid+)   Additional Comments: walk in shower in roommates room if needed.      Prior Functioning/Environment Level of Independence: Independent        Comments: Pt reports independent with community ambulation, ADLs, drives, 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        OT Problem List: Decreased  activity tolerance;Cardiopulmonary status limiting activity      OT Treatment/Interventions:      OT Goals(Current goals can be found in the care plan section) Acute Rehab OT Goals Patient Stated Goal: return to independence OT Goal Formulation: All assessment and education complete, DC therapy  OT Frequency:     Barriers to D/C:            Co-evaluation              AM-PAC OT "6 Clicks" Daily Activity     Outcome Measure Help from another person eating meals?: None Help from another person taking care of personal grooming?: None Help from another person toileting, which includes using toliet, bedpan, or urinal?: None Help from another person bathing (including washing, rinsing, drying)?: None Help from another person to put on and taking off regular upper body clothing?: None Help from another person to put on and taking off regular lower body clothing?: None 6 Click Score: 24   End of Session Equipment Utilized During Treatment: Oxygen Nurse Communication:  (okay to see per Rn)  Activity Tolerance: Patient tolerated treatment well Patient left: in chair;with call bell/phone within reach  OT Visit Diagnosis: Muscle weakness (generalized) (M62.81)                Time: 3559-7416 OT Time Calculation (min): 22 min Charges:  OT General Charges $OT Visit: 1 Visit OT Evaluation $OT Eval Low Complexity: 1 Low  Landon Bassford, OTR/[place]  Office [phone] Pager: [phone]   Lenward Chancellor 10/13/2020, 10:12 AM

## 2020-10-13 NOTE — Discharge Summary (Signed)
Physician Discharge Summary  Jordan [name] BWG:[id number] DOB: [birth year] DOA: 10/08/2020  PCP: [name], NP  Admit date: 10/08/2020 Discharge date: 10/13/2020  Admitted From: Home  Disposition:  Home   Recommendations for Outpatient Follow-up and new medication changes:  1. Follow up with [name] in 2 weeks.  2. Continue self quarantine for 2 weeks, use a mask in public and maintain physical distancing.  3. Continue prednisone 20 mg daily for 5 more days. 4. Antitussive agents and bronchodilator as needed.   Home Health: no   Equipment/Devices: home 02    Discharge Condition: stable  CODE STATUS: full  Diet recommendation: heart healthy   Brief/Interim Summary: Jordan [name] admitted to the hospital with the working diagnosis of acute hypoxic respiratory failure due to SARS COVID-19 viral pneumonia.  69 year old female with past medical history for hypertension, dyslipidemia, osteoarthritis, depression, obesity and tobacco abuse who presented with worsening dyspnea. Patient vaccinated for COVID-19, last dose 08/16/2020. Tested positive for COVID-19 10/7 after positive sick contact. Her symptoms were consistent with arthralgias, nausea and fever. Initially seen in the ED 10/01/2020 anddischarge home. She continued to have nausea, cough, easy fatigability and dyspnea with minimal efforts. She returned to be ED on 10/17, her blood pressure was 122/76, heart rate 90, respiratory rate 24, temperature 99.3, oxygen saturation 86% on room air. Her lungs had bibasilar rales, no wheezing, heart S1-S2, present rhythm, soft abdomen, no lower extremity edema. Sodium 136, potassium 3.7, chloride 101, bicarb 21, glucose 117, BUN 33, creatinine 1.19, white count 8.2, hemoglobin 14.8, hematocrit 43.4, platelets 291.  Urinalysis negative for infection.  EKG 88 bpm, left axis deviation, left anterior fascicular block, sinus rhythm, no ST segment or T wave changes.  Chest  radiograph with faint peripheral bilateral interstitial infiltrates. Chest CT with multiple opacities bilaterally, no pulmonary embolism.  Patient was placed on medical therapy with high-dose systemic steroids, remdesivir and supplemental oxygen.  She has responding well to medical therapy with improvement in her symptoms, inflammatory markers and oxygen requirements.    1.  Acute hypoxic respiratory failure due to SARS COVID-19 viral pneumonia.  Patient was admitted to the medical ward, she was placed on a remote telemetry monitor, received submental oxygen per high flow nasal cannula with good toleration. Medical therapy with systemic corticosteroids and remdesivir.  She was treated with bronchodilator therapy, antitussive agents and airway clearing techniques.  Symptoms, oxygen requirements and inflammatory markers improved.  COVID-19 Labs  Recent Labs    10/11/20 0447 10/12/20 0421 10/13/20 0414  DDIMER <0.27 0.33 0.35  FERRITIN 365* 308* 288  LDH 155  --   --   CRP 2.4* 0.9 0.6    No results found for: SARSCOV2NAA  At discharge her oxygenation was 92% at rest on 4 L per nasal cannula, on ambulation she dropped to 88%. Patient will be discharged on home oxygen, and follow-up as an outpatient within 2 weeks. Continue prednisone 20 mg daily for 5 more days.  2.  Hypertension.  Her blood pressure remained well controlled, at discharge will resume her antihypertensive agents.   3.  GERD.  Continue pantoprazole.  4.  Depression.  Continue duloxetine.  5.  Obesity class III.  BMI 43.6, follow-up as an outpatient.     Discharge Diagnoses:  Principal Problem:   Acute hypoxemic respiratory failure due to COVID-19 [facility]) Active Problems:   Depression   Essential hypertension   GERD (gastroesophageal reflux disease)   Morbidly obese [facility])    Discharge Instructions  Allergies as of 10/13/2020   No Known Allergies     Medication List    STOP taking these  medications   azithromycin 250 MG tablet Commonly known as: ZITHROMAX     TAKE these medications   acetaminophen 500 MG tablet Commonly known as: TYLENOL Take 1,000 mg by mouth every 6 (six) hours as needed for fever.   allopurinol 100 MG tablet Commonly known as: ZYLOPRIM TAKE 2 TABLETS BY MOUTH DAILY What changed:   how much to take  when to take this   aspirin EC 81 MG tablet Take 81 mg by mouth daily.   Cholecalciferol 25 MCG (1000 UT) capsule Take 1,000 Units by mouth daily.   DULoxetine 60 MG capsule Commonly known as: CYMBALTA TAKE 1 CAPSULE BY MOUTH DAILY   furosemide 40 MG tablet Commonly known as: LASIX TAKE 1 TABLET (40 MG TOTAL) BY MOUTH DAILY AS NEEDED. What changed: reasons to take this   guaiFENesin-dextromethorphan 100-10 MG/5ML syrup Commonly known as: ROBITUSSIN DM Take 5 mLs by mouth every 6 (six) hours as needed for cough.   multivitamin with minerals Tabs tablet Take 1 tablet by mouth daily.   ondansetron 4 MG tablet Commonly known as: Zofran Take 1 tablet (4 mg total) by mouth every 8 (eight) hours as needed for nausea or vomiting.   pantoprazole 40 MG tablet Commonly known as: PROTONIX TAKE 1 TABLET BY MOUTH DAILY.   predniSONE 20 MG tablet Commonly known as: DELTASONE Take 1 tablet (20 mg total) by mouth daily with breakfast for 5 days. Start taking on: October 14, 2020   triamterene-hydrochlorothiazide 37.5-25 MG capsule Commonly known as: DYAZIDE TAKE 1 CAPSULE BY MOUTH ONCE EVERY MORNING What changed: See the new instructions.            Durable Medical Equipment  (From admission, onward)         Start     Ordered   10/13/20 1042  For home use only DME oxygen  Once       Question Answer Comment  Length of Need 6 Months   Mode or (Route) Nasal cannula   Liters per Minute 5   Frequency Continuous (stationary and portable oxygen unit needed)   Oxygen conserving device Yes   Oxygen delivery system Gas      10/13/20  1042          Follow-up Information    [name], NP Follow up in 2 week(s).   Specialty: Internal Medicine Contact information: [place] [address] [phone]              No Known Allergies      Procedures/Studies: CT Angio Chest PE W and/or Wo Contrast  Result Date: 10/08/2020 CLINICAL DATA:  Shortness of breath. COVID-19 positive. Positive D-dimer EXAM: CT ANGIOGRAPHY CHEST WITH CONTRAST TECHNIQUE: Multidetector CT imaging of the chest was performed using the standard protocol during bolus administration of intravenous contrast. Multiplanar CT image reconstructions and MIPs were obtained to evaluate the vascular anatomy. CONTRAST:  66mL OMNIPAQUE IOHEXOL 350 MG/ML SOLN COMPARISON:  Chest radiograph October 08, 2020 FINDINGS: Cardiovascular: There is no evident pulmonary embolus. There is no thoracic aortic aneurysm or dissection. There is calcification at the origin of the left subclavian artery. Visualized great vessels otherwise appear unremarkable. There are foci of aortic atherosclerosis. There are scattered foci of coronary artery calcification. There is no pericardial effusion or pericardial thickening. Mediastinum/Nodes: There is a 1.0 cm nodular opacity  in the left lobe of the thyroid. Thyroid otherwise appears unremarkable. There is no appreciable thoracic adenopathy. No esophageal lesions are evident. Lungs/Pleura: There is airspace opacity scattered throughout the lungs bilaterally without appreciable consolidation. No pleural effusions are appreciable. No areas of cavitation noted. Upper Abdomen: There is hepatic steatosis. There is upper abdominal aortic atherosclerosis. There is mild left adrenal hypertrophy bilaterally. Visualized upper abdominal structures otherwise appear unremarkable. Musculoskeletal: There is elevation of the right hemidiaphragm. There is degenerative change in the thoracic spine. There are no blastic or  lytic bone lesions. No evident chest wall lesions. Review of the MIP images confirms the above findings. IMPRESSION: 1. No demonstrable pulmonary embolus. No thoracic aortic aneurysm or dissection. There are foci of aortic atherosclerosis as well as foci great vessel and coronary artery calcification. 2. Multifocal airspace opacity consistent with multifocal pneumonia, likely of atypical organism etiology. No consolidation. No pleural effusions. 3.  No evident adenopathy. 4.  Hepatic steatosis. Aortic Atherosclerosis (ICD10-I70.0). Electronically Signed   By: Lowella Grip III M.D.   On: 10/08/2020 12:50   DG Chest Portable 1 View  Result Date: 10/08/2020 CLINICAL DATA:  Shortness of breath.  COVID-19 positive EXAM: PORTABLE CHEST 1 VIEW COMPARISON:  October 01, 2020 FINDINGS: There is slight bibasilar atelectasis. The lungs elsewhere are clear. Heart is upper normal in size with pulmonary vascularity normal. No adenopathy. No bone lesions. IMPRESSION: Slight bibasilar atelectasis. Lungs elsewhere clear. Stable cardiac silhouette. No adenopathy evident. Electronically Signed   By: Lowella Grip III M.D.   On: 10/08/2020 11:03   DG Chest Port 1 View  Result Date: 10/01/2020 CLINICAL DATA:  Shortness of breath.  COVID-19 virus infection. EXAM: PORTABLE CHEST 1 VIEW COMPARISON:  12/01/2018 FINDINGS: The heart size and mediastinal contours are within normal limits. Both lungs are clear. The visualized skeletal structures are unremarkable. IMPRESSION: No active disease. Electronically Signed   By: Marlaine Hind M.D.   On: 10/01/2020 19:09        Subjective: Patient is feeling better, dyspnea has improved, out of bed to chair.   Discharge Exam: Vitals:   10/12/20 2100 10/13/20 0312  BP:  135/72  Pulse:  (!) 58  Resp:  15  Temp:  98.3 F (36.8 C)  SpO2: 96% 98%   Vitals:   10/12/20 1329 10/12/20 1924 10/12/20 2100 10/13/20 0312  BP: 129/67 126/69  135/72  Pulse: 69 70  (!) 58  Resp:   15  15  Temp: 98 F (36.7 C) 98.3 F (36.8 C)  98.3 F (36.8 C)  TempSrc: Oral Oral  Oral  SpO2: 91% 96% 96% 98%  Weight:      Height:        General: Not in pain or dyspnea.  Neurology: Awake and alert, non focal  E ENT: no pallor, no icterus, oral mucosa moist Cardiovascular: No JVD. S1-S2 present, rhythmic, no gallops, rubs, or murmurs. No lower extremity edema. Pulmonary: positive breath sounds bilaterally, Gastrointestinal. Abdomen soft and non tender Skin. No rashes Musculoskeletal: no joint deformities   The results of significant diagnostics from this hospitalization (including imaging, microbiology, ancillary and laboratory) are listed below for reference.     Microbiology: Recent Results (from the past 240 hour(s))  Blood Culture (routine x 2)     Status: None   Collection Time: 10/08/20 10:25 AM   Specimen: Right Antecubital; Blood  Result Value Ref Range Status   Specimen Description   Final    RIGHT ANTECUBITAL Performed at [facility], [address] [place], [place] [address]    Special Requests   Final    BOTTLES DRAWN AEROBIC AND ANAEROBIC Blood Culture adequate volume Performed at [facility], [address] [place], [place] [address]    Culture   Final    NO GROWTH 5 DAYS Performed at [facility], [place] [address] [place], [place] [address]    Report Status 10/13/2020 FINAL  Final     Labs: BNP (last 3 results) Recent Labs    10/08/20 1046  BNP 78.9   Basic Metabolic Panel: Recent Labs  Lab 10/09/20 0417 10/10/20 0411 10/11/20 0447 10/12/20 0421 10/13/20 0414  NA 138 137 136 135 138  K 3.7 3.8 4.1 4.4 3.9  CL 101 100 102 101 104  CO2 25 25 24 24 25   GLUCOSE 194* 184* 177* 184* 112*  BUN 33* 38* 35* 31* 33*  CREATININE 0.93 0.98 0.83 0.83 1.01*  CALCIUM 8.9 8.8* 8.6* 8.5* 8.5*  MG 2.0 1.8 2.0  --   --   PHOS 3.7 3.9 3.7  --   --    Liver Function Tests: Recent Labs  Lab 10/09/20 0417  10/10/20 0411 10/11/20 0447 10/12/20 0421 10/13/20 0414  AST 27 28 34 45* 25  ALT 47* 49* 58* 90* 71*  ALKPHOS 102 83 82 70 69  BILITOT 0.8 0.6 0.6 0.8 0.6  PROT 6.7 6.2* 6.0* 5.8* 5.6*  ALBUMIN 3.2* 2.8* 2.9* 2.9* 2.9*   No results for input(s): LIPASE, AMYLASE in the last 168 hours. No results for input(s): AMMONIA in the last 168 hours. CBC: Recent Labs  Lab 10/08/20 1045 10/09/20 0417 10/10/20 0411 10/11/20 0447  WBC 8.2 5.0 8.2 8.1  NEUTROABS 5.8 3.5 6.4 6.1  HGB 14.8 13.8 13.3 13.4  HCT 43.4 40.3 38.8 39.1  MCV 91.6 92.2 92.8 91.6  PLT 291 289 314 322   Cardiac Enzymes: No results for input(s): CKTOTAL, CKMB, CKMBINDEX, TROPONINI in the last 168 hours. BNP: Invalid input(s): POCBNP CBG: No results for input(s): GLUCAP in the last 168 hours. D-Dimer Recent Labs    10/12/20 0421 10/13/20 0414  DDIMER 0.33 0.35   Hgb A1c No results for input(s): HGBA1C in the last 72 hours. Lipid Profile No results for input(s): CHOL, HDL, LDLCALC, TRIG, CHOLHDL, LDLDIRECT in the last 72 hours. Thyroid function studies No results for input(s): TSH, T4TOTAL, T3FREE, THYROIDAB in the last 72 hours.  Invalid input(s): FREET3 Anemia work up Recent Labs    10/12/20 0421 10/13/20 0414  FERRITIN 308* 288   Urinalysis    Component Value Date/Time   COLORURINE YELLOW 10/01/2020 [place] 10/01/2020 1737   LABSPEC 1.025 10/01/2020 1737   PHURINE 5.5 10/01/2020 1737   GLUCOSEU NEGATIVE 10/01/2020 1737   GLUCOSEU NEGATIVE 08/11/2015 1023   HGBUR TRACE (A) 10/01/2020 1737   BILIRUBINUR NEGATIVE 10/01/2020 1737   [place] 10/01/2020 1737   PROTEINUR 30 (A) 10/01/2020 1737   UROBILINOGEN 0.2 08/11/2015 1023   NITRITE NEGATIVE 10/01/2020 1737   LEUKOCYTESUR NEGATIVE 10/01/2020 1737   Sepsis Labs Invalid input(s): PROCALCITONIN,  WBC,  LACTICIDVEN Microbiology Recent Results (from the past 240 hour(s))  Blood Culture (routine x 2)     Status:  None   Collection Time: 10/08/20 10:25 AM   Specimen: Right Antecubital; Blood  Result Value Ref Range Status   Specimen Description   Final    RIGHT ANTECUBITAL Performed at [facility]  [address] [place] [place], [place] [address]    Special Requests   Final    BOTTLES DRAWN AEROBIC AND ANAEROBIC Blood Culture adequate volume Performed at [facility], [address] [place], [place] [address]    Culture   Final    NO GROWTH 5 DAYS Performed at [facility], [place] [address], [place], [place] [address]    Report Status 10/13/2020 FINAL  Final     Time coordinating discharge: 45 minutes  SIGNED:   Tawni Millers, MD  [facility] 10/13/2020, 10:46 AM

## 2020-10-16 ENCOUNTER — Other Ambulatory Visit: Payer: Self-pay | Admitting: Family

## 2020-10-16 MED FILL — PANTOPRAZOLE SOD DR 40 MG T: 40 | 90 days supply | Qty: 90 | Fill #0

## 2020-10-17 ENCOUNTER — Telehealth: Payer: Self-pay

## 2020-10-17 NOTE — Telephone Encounter (Signed)
Transition Care Management Follow-up Telephone Call  Date of discharge and from where: 10/13/20-  How have you been since you were released from the hospital? Doing alright  Any questions or concerns? No  Items Reviewed:  Did the pt receive and understand the discharge instructions provided? Yes   Medications obtained and verified? Yes   Other? Yes   Any new allergies since your discharge? No   Dietary orders reviewed? Yes  Do you have support at home? Yes   Home Care and Equipment/Supplies: Were home health services ordered? no If so, what is the name of the agency? N/A  Has the agency set up a time to come to the patient's home? not applicable Were any new equipment or medical supplies ordered?  Yes: oxygen What is the name of the medical supply agency? unknown Were you able to get the supplies/equipment? yes Do you have any questions related to the use of the equipment or supplies? No  Functional Questionnaire: (I = Independent and D = Dependent) ADLs: I  Bathing/Dressing- I  Meal Prep- I  Eating- I  Maintaining continence- I  Transferring/Ambulation- I  Managing Meds- I  Follow up appointments reviewed:   PCP Hospital f/u appt confirmed? Yes  Scheduled to see [name] on 10/27/2020 @ [facility] f/u appt confirmed? N/A   Are transportation arrangements needed? No   If their condition worsens, is the pt aware to call PCP or go to the Emergency Dept.? Yes  Was the patient provided with contact information for the PCP's office or ED? Yes  Was to pt encouraged to call back with questions or concerns? Yes

## 2020-10-17 NOTE — Telephone Encounter (Signed)
TCM call. No answer- Left message for pt to call back.

## 2020-10-17 NOTE — Telephone Encounter (Signed)
Pt called back and I let her know that you might be at lunch and that you would give her a call back

## 2020-10-23 ENCOUNTER — Ambulatory Visit: Payer: Self-pay | Admitting: *Deleted

## 2020-10-24 ENCOUNTER — Encounter (INDEPENDENT_AMBULATORY_CARE_PROVIDER_SITE_OTHER): Payer: Self-pay

## 2020-10-27 ENCOUNTER — Other Ambulatory Visit: Payer: Self-pay

## 2020-10-27 ENCOUNTER — Encounter: Payer: Self-pay | Admitting: Family

## 2020-10-27 ENCOUNTER — Ambulatory Visit (INDEPENDENT_AMBULATORY_CARE_PROVIDER_SITE_OTHER): Payer: PPO | Admitting: Family

## 2020-10-27 VITALS — BP 149/81 | HR 87 | Temp 98.7°F | Resp 16 | Ht 64.0 in | Wt 254.0 lb

## 2020-10-27 DIAGNOSIS — M25552 Pain in left hip: Secondary | ICD-10-CM

## 2020-10-27 DIAGNOSIS — Z23 Encounter for immunization: Secondary | ICD-10-CM | POA: Diagnosis not present

## 2020-10-27 DIAGNOSIS — U071 COVID-19: Secondary | ICD-10-CM

## 2020-10-27 DIAGNOSIS — R739 Hyperglycemia, unspecified: Secondary | ICD-10-CM | POA: Diagnosis not present

## 2020-10-27 DIAGNOSIS — Z8616 Personal history of COVID-19: Secondary | ICD-10-CM

## 2020-10-27 NOTE — Patient Instructions (Signed)
Use oxygen only as needed. Complete lab work prior to leaving.

## 2020-10-27 NOTE — Progress Notes (Signed)
Subjective:    Patient ID: Jordan [name], female    DOB: [birth year], 69 y.o.   MRN: [id number]  HPI  Patient is a 69 yr old female who presents today for follow up of her covid-19 infection. She was admitted 10/17-10/22/2021 due to covid-19 and hypoxia.  She was admitted, placed on oxygen, and high dose systemic steroids, remdesivir and supplemental oxygen. She was discharged home on a prednisone taper.   Notes occasional AM nausea.  She is doing small things around the house.  She has been trying to take her oxygen off for a few house.  She reports that she has been wearing at night.  Energy is still not back to her baseline.    Review of Systems See HPI  Past Medical History:  Diagnosis Date  . Cellulitis and abscess of finger, unspecified 401.9  . Cellulitis and abscess of foot, except toes   . Depressive disorder, not elsewhere classified   . GERD (gastroesophageal reflux disease)   . Gout, unspecified   . Hyperglycemia 05/08/2011  . Hyperlipidemia    borderline- not on meds  . Hypertension   . Insomnia   . Leg edema    chronic  . Obesity, unspecified   . Osteoarthritis   . Renal insufficiency, mild 05/08/2011  . Small bowel obstruction (HCC)      Social History   Socioeconomic History  . Marital status: Single    Spouse name: Not on file  . Number of children: 2  . Years of education: Not on file  . Highest education level: Not on file  Occupational History  . 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 Smoking status: Current Some Day Smoker    Packs/day: 0.25    Types: Cigarettes    Last attempt to quit: 12/26/2014    Years since quitting: 5.8  . Smokeless tobacco: Never Used  Vaping Use  . Vaping Use: Never used  Substance and Sexual Activity  . Alcohol use: Yes    Alcohol/week: 0.0 standard drinks    Comment: rare  . Drug use: No  . Sexual activity: Never  Other Topics Concern  . Not on file  Social History Narrative   2  children (daughter and son) both local. 3 grandchildren, one great grandchild   Works at ED front desk   Divorced (married x 30 years)   Social Determinants of Health   Financial Resource Strain:   . Difficulty of Paying Living Expenses: Not on file  Food Insecurity:   . Worried About Charity fundraiser in the Last Year: Not on file  . Ran Out of Food in the Last Year: Not on file  Transportation Needs:   . Lack of Transportation (Medical): Not on file  . Lack of Transportation (Non-Medical): Not on file  Physical Activity:   . Days of Exercise per Week: Not on file  . Minutes of Exercise per Session: Not on file  Stress:   . Feeling of Stress : Not on file  Social Connections:   . Frequency of Communication with Friends and Family: Not on file  . Frequency of Social Gatherings with Friends and Family: Not on file  . Attends Religious Services: Not on file  . Active Member of Clubs or Organizations: Not on file  . Attends Archivist Meetings: Not on file  . Marital Status: Not on file  Intimate Partner Violence:   . Fear  of Current or Ex-Partner: Not on file  . Emotionally Abused: Not on file  . Physically Abused: Not on file  . Sexually Abused: Not on file    Past Surgical History:  Procedure Laterality Date  . ABDOMINAL HYSTERECTOMY  2002   infection- no history of cancer  . APPENDECTOMY  2002  . catacact Right 11/01/2019  . CHOLECYSTECTOMY  1977  . ECTOPIC PREGNANCY SURGERY  1971  . right shoulder rotator cuff repair  06-07-10  . TOTAL HIP ARTHROPLASTY Left 1998    Family History  Problem Relation Age of Onset  . Diabetes Mother   . Cancer Mother        colon/ pancreatic  . Colon cancer Mother 38  . Hypertension Father   . Alcohol abuse Father   . Cirrhosis Father   . Other Sister        Pyeoderma gangrenosis  . Obesity Daughter   . Diabetes Sister        type 2  . Kidney failure Sister   . Liver disease Sister   . Heart attack Brother   .  Cirrhosis Sister        had NASH, died heart failure    No Known Allergies  Current Outpatient Medications on File Prior to Visit  Medication Sig Dispense Refill  . acetaminophen (TYLENOL) 500 MG tablet Take 1,000 mg by mouth every 6 (six) hours as needed for fever.    Marland Kitchen allopurinol (ZYLOPRIM) 100 MG tablet TAKE 2 TABLETS BY MOUTH DAILY (Patient taking differently: Take 100 mg by mouth 2 (two) times daily. ) 180 tablet 1  . aspirin EC 81 MG tablet Take 81 mg by mouth daily.    . Cholecalciferol 25 MCG (1000 UT) capsule Take 1,000 Units by mouth daily.    . DULoxetine (CYMBALTA) 60 MG capsule TAKE 1 CAPSULE BY MOUTH DAILY (Patient taking differently: Take 60 mg by mouth daily. ) 90 capsule 1  . furosemide (LASIX) 40 MG tablet TAKE 1 TABLET (40 MG TOTAL) BY MOUTH DAILY AS NEEDED. (Patient taking differently: Take 40 mg by mouth daily as needed for fluid. ) 90 tablet 1  . guaiFENesin-dextromethorphan (ROBITUSSIN DM) 100-10 MG/5ML syrup Take 5 mLs by mouth every 6 (six) hours as needed for cough. 118 mL 0  . Multiple Vitamin (MULTIVITAMIN WITH MINERALS) TABS tablet Take 1 tablet by mouth daily.    . ondansetron (ZOFRAN) 4 MG tablet Take 1 tablet (4 mg total) by mouth every 8 (eight) hours as needed for nausea or vomiting. 20 tablet 0  . pantoprazole (PROTONIX) 40 MG tablet TAKE 1 TABLET BY MOUTH DAILY. 90 tablet 1  . triamterene-hydrochlorothiazide (DYAZIDE) 37.5-25 MG capsule TAKE 1 CAPSULE BY MOUTH ONCE EVERY MORNING (Patient taking differently: Take 1 capsule by mouth daily. ) 90 capsule 0  . [DISCONTINUED] famotidine (PEPCID AC) 10 MG chewable tablet Chew 10 mg by mouth daily.       No current facility-administered medications on file prior to visit.    BP (!) 149/81 (BP Location: Right Arm, Patient Position: Sitting, Cuff Size: Small)   Pulse 87   Temp 98.7 F (37.1 C) (Oral)   Resp 16   Ht 5\' 4"  (1.626 m)   Wt 254 lb (115.2 kg)   SpO2 98%   BMI 43.60 kg/m       Objective:    Physical Exam Constitutional:      Appearance: She is well-developed.  Cardiovascular:     Rate and Rhythm: Normal  rate and regular rhythm.     Heart sounds: Normal heart sounds. No murmur heard.   Pulmonary:     Effort: Pulmonary effort is normal. No respiratory distress.     Breath sounds: Normal breath sounds. No wheezing.  Psychiatric:        Behavior: Behavior normal.        Thought Content: Thought content normal.        Judgment: Judgment normal.           Assessment & Plan:  COVID-19- She is continuing to improve following her hospital discharge.  I did check a room air sat with ambulation and she remained at 96%.  I advised her that at this point, she only needs the oxygen on an as needed basis.  I recommended that she get a 6 month covid-19 booster which she is not due for until 02/16/21.   Hyperglycemia- will obtain follow up A1C. Recent steroids may have raised her A1C.  Lab Results  Component Value Date   HGBA1C 6.2 07/20/2019   Left hip pain- will refer to orthopedics.   This visit occurred during the SARS-CoV-2 public health emergency.  Safety protocols were in place, including screening questions prior to the visit, additional usage of staff PPE, and extensive cleaning of exam room while observing appropriate contact time as indicated for disinfecting solutions.

## 2020-10-28 ENCOUNTER — Telehealth: Payer: Self-pay | Admitting: Family

## 2020-10-28 LAB — COMPREHENSIVE METABOLIC PANEL
AG Ratio: 2 (calc) (ref 1.0–2.5)
ALT: 35 U/L — ABNORMAL HIGH (ref 6–29)
AST: 27 U/L (ref 10–35)
Albumin: 4.5 g/dL (ref 3.6–5.1)
Alkaline phosphatase (APISO): 66 U/L (ref 37–153)
BUN/Creatinine Ratio: 19 (calc) (ref 6–22)
BUN: 22 mg/dL (ref 7–25)
CO2: 33 mmol/L — ABNORMAL HIGH (ref 20–32)
Calcium: 10 mg/dL (ref 8.6–10.4)
Chloride: 91 mmol/L — ABNORMAL LOW (ref 98–110)
Creat: 1.14 mg/dL — ABNORMAL HIGH (ref 0.60–0.93)
Globulin: 2.3 g/dL (calc) (ref 1.9–3.7)
Glucose, Bld: 121 mg/dL — ABNORMAL HIGH (ref 65–99)
Potassium: 4.2 mmol/L (ref 3.5–5.3)
Sodium: 133 mmol/L — ABNORMAL LOW (ref 135–146)
Total Bilirubin: 0.6 mg/dL (ref 0.2–1.2)
Total Protein: 6.8 g/dL (ref 6.1–8.1)

## 2020-10-28 LAB — CBC WITH DIFFERENTIAL/PLATELET
Absolute Monocytes: 518 cells/uL (ref 200–950)
Basophils Absolute: 38 cells/uL (ref 0–200)
Basophils Relative: 0.6 %
Eosinophils Absolute: 192 cells/uL (ref 15–500)
Eosinophils Relative: 3 %
HCT: 42.2 % (ref 35.0–45.0)
Hemoglobin: 14.3 g/dL (ref 11.7–15.5)
Lymphs Abs: 1952 cells/uL (ref 850–3900)
MCH: 31.7 pg (ref 27.0–33.0)
MCHC: 33.9 g/dL (ref 32.0–36.0)
MCV: 93.6 fL (ref 80.0–100.0)
MPV: 11.6 fL (ref 7.5–12.5)
Monocytes Relative: 8.1 %
Neutro Abs: 3699 cells/uL (ref 1500–7800)
Neutrophils Relative %: 57.8 %
Platelets: 171 10*3/uL (ref 140–400)
RBC: 4.51 10*6/uL (ref 3.80–5.10)
RDW: 13.6 % (ref 11.0–15.0)
Total Lymphocyte: 30.5 %
WBC: 6.4 10*3/uL (ref 3.8–10.8)

## 2020-10-28 LAB — HEMOGLOBIN A1C
Hgb A1c MFr Bld: 6.2 % of total Hgb — ABNORMAL HIGH (ref <5.7)
Mean Plasma Glucose: 131 (calc)
eAG (mmol/L): 7.3 (calc)

## 2020-10-28 NOTE — Telephone Encounter (Signed)
See mychart.  

## 2020-10-30 ENCOUNTER — Other Ambulatory Visit: Payer: Self-pay | Admitting: Family

## 2020-10-30 ENCOUNTER — Telehealth: Payer: Self-pay | Admitting: Family

## 2020-10-30 MED ORDER — HYDROCHLOROTHIAZIDE 25 MG PO TABS: 25.0000 mg | ORAL_TABLET | Freq: Every day | ORAL | 1 refills | Status: DC

## 2020-10-30 MED FILL — HYDROCHLOROTHIAZIDE 25 MG T: 25 | 90 days supply | Qty: 90 | Fill #0

## 2020-10-30 NOTE — Telephone Encounter (Signed)
See mychart.  

## 2020-11-03 ENCOUNTER — Other Ambulatory Visit: Payer: Self-pay | Admitting: Family

## 2020-11-03 MED FILL — ALLOPURINOL 100 MG TABS: 100 | 90 days supply | Qty: 180 | Fill #0

## 2020-11-03 MED FILL — DULOXETINE HCL 60 MG CPEP: 60 | 90 days supply | Qty: 90 | Fill #0

## 2020-11-08 ENCOUNTER — Encounter: Payer: Self-pay | Admitting: *Deleted

## 2020-11-13 DIAGNOSIS — U071 COVID-19: Secondary | ICD-10-CM | POA: Diagnosis not present

## 2020-11-14 ENCOUNTER — Other Ambulatory Visit: Payer: Self-pay

## 2020-11-14 ENCOUNTER — Ambulatory Visit: Payer: PPO | Admitting: Orthopaedic Surgery

## 2020-11-14 ENCOUNTER — Ambulatory Visit (INDEPENDENT_AMBULATORY_CARE_PROVIDER_SITE_OTHER): Payer: PPO

## 2020-11-14 ENCOUNTER — Other Ambulatory Visit: Payer: Self-pay | Admitting: Orthopaedic Surgery

## 2020-11-14 VITALS — Ht 64.0 in | Wt 250.0 lb

## 2020-11-14 DIAGNOSIS — M25552 Pain in left hip: Secondary | ICD-10-CM

## 2020-11-14 MED ORDER — DICLOFENAC SODIUM 75 MG PO TBEC: 75.0000 mg | DELAYED_RELEASE_TABLET | Freq: Two times a day (BID) | ORAL | 2 refills | Status: DC

## 2020-11-14 MED FILL — DICLOFENAC SODIUM 75 MG TAB: 75 | 15 days supply | Qty: 30 | Fill #0

## 2020-11-14 NOTE — Progress Notes (Signed)
Office Visit Note   Patient: Jordan [name]           Date of Birth: [birth year]           MRN: [id number] Visit Date: 11/14/2020              Requested by: [name], NP [address] [place],  [place] [address] PCP: [name], NP   Assessment & Plan: Visit Diagnoses:  1. Pain in left hip     Plan: Impression is left SI joint dysfunction versus referred low back pain.  Since she did get relief from diclofenac I went ahead and gave her a formal prescription for this.  She is to contact us if she does not feel any improvement from the diclofenac in the next couple weeks.  We will see her back as needed.  Follow-Up Instructions: Return if symptoms worsen or fail to improve.   Orders:  Orders Placed This Encounter  Procedures  . XR HIP UNILAT W OR W/O PELVIS 2-3 VIEWS LEFT   Meds ordered this encounter  Medications  . diclofenac (VOLTAREN) 75 MG EC tablet    Sig: Take 1 tablet (75 mg total) by mouth 2 (two) times daily.    Dispense:  30 tablet    Refill:  2      Procedures: No procedures performed   Clinical Data: No additional findings.   Subjective: Chief Complaint  Patient presents with  . Left Hip - Pain    Jordan [name] is a 69 year old female who is status post left total hip replacement 20 to 25 years ago in [place] by Dr. Forestine Na.  She contracted Covid about 6 weeks ago and since then she has had increasing left buttock and hip pain.  Denies any radicular symptoms.  She states that the pain will sometimes radiate across the hip into the front in the groin.  She did take a diclofenac from one of her friends and it helped tremendously.  Denies any bowel or bladder dysfunction or constitutional symptoms.  Standing and ambulation make the pain more severe.   Review of Systems  Constitutional: Negative.   HENT: Negative.   Eyes: Negative.   Respiratory: Negative.   Cardiovascular: Negative.   Endocrine: Negative.     Musculoskeletal: Negative.   Neurological: Negative.   Hematological: Negative.   Psychiatric/Behavioral: Negative.   All other systems reviewed and are negative.    Objective: Vital Signs: Ht 5\' 4"  (1.626 m)   Wt 250 lb (113.4 kg)   BMI 42.91 kg/m   Physical Exam Vitals and nursing note reviewed.  Constitutional:      Appearance: She is well-developed.  HENT:     Head: Normocephalic and atraumatic.  Pulmonary:     Effort: Pulmonary effort is normal.  Abdominal:     Palpations: Abdomen is soft.  Musculoskeletal:     Cervical back: Neck supple.  Skin:    General: Skin is warm.     Capillary Refill: Capillary refill takes less than 2 seconds.  Neurological:     Mental Status: She is alert and oriented to person, place, and time.  Psychiatric:        Behavior: Behavior normal.        Thought Content: Thought content normal.        Judgment: Judgment normal.     Ortho Exam Left hip shows a fully healed surgical scar.  She is quite tender over the paraspinous soft tissues of the  lumbar spine as well as the left SI joint.  Negative Faber.  Hip range of motion is pain-free.  Lateral hip is nontender to palpation. Specialty Comments:  No specialty comments available.  Imaging: XR HIP UNILAT W OR W/O PELVIS 2-3 VIEWS LEFT  Result Date: 11/14/2020 Status post left total hip placement.  No evidence of acute abnormalities.    PMFS History: Patient Active Problem List   Diagnosis Date Noted  . Morbidly obese ([place]) 10/09/2020  . Acute hypoxemic respiratory failure due to COVID-19 ([facility]) 10/08/2020  . SBO (small bowel obstruction) ([facility]) 12/01/2018  . Hypokalemia 12/01/2018  . Tobacco abuse 12/01/2018  . Benign paroxysmal positional vertigo 07/25/2015  . Routine general medical examination at a health care facility 07/06/2012  . GERD (gastroesophageal reflux disease) 01/03/2012  . Hyperglycemia 05/08/2011  . General medical examination 05/06/2011  . Gout 02/21/2010   . Degenerative disc disease, lumbar 07/11/2009  . Osteoarthritis 02/19/2009  . Hyperlipidemia 01/16/2009  . Obesity, Class III, BMI 40-49.9 (morbid obesity) ([facility]) 02/17/2008  . Depression 09/04/2007  . Essential hypertension 09/04/2007   Past Medical History:  Diagnosis Date  . Cellulitis and abscess of finger, unspecified 401.9  . Cellulitis and abscess of foot, except toes   . Depressive disorder, not elsewhere classified   . GERD (gastroesophageal reflux disease)   . Gout, unspecified   . Hyperglycemia 05/08/2011  . Hyperlipidemia    borderline- not on meds  . Hypertension   . Insomnia   . Leg edema    chronic  . Obesity, unspecified   . Osteoarthritis   . Renal insufficiency, mild 05/08/2011  . Small bowel obstruction (HCC)     Family History  Problem Relation Age of Onset  . Diabetes Mother   . Cancer Mother        colon/ pancreatic  . Colon cancer Mother 60  . Hypertension Father   . Alcohol abuse Father   . Cirrhosis Father   . Other Sister        Pyeoderma gangrenosis  . Obesity Daughter   . Diabetes Sister        type 2  . Kidney failure Sister   . Liver disease Sister   . Heart attack Brother   . Cirrhosis Sister        had NASH, died heart failure    Past Surgical History:  Procedure Laterality Date  . ABDOMINAL HYSTERECTOMY  2002   infection- no history of cancer  . APPENDECTOMY  2002  . catacact Right 11/01/2019  . CHOLECYSTECTOMY  1977  . ECTOPIC PREGNANCY SURGERY  1971  . right shoulder rotator cuff repair  06-07-10  . TOTAL HIP ARTHROPLASTY Left 1998   Social History   Occupational History  . 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 Smoking status: Current Some Day Smoker    Packs/day: 0.25    Types: Cigarettes    Last attempt to quit: 12/26/2014    Years since quitting: 5.8  . Smokeless tobacco: Never Used  Vaping Use  . Vaping Use: Never used  Substance and Sexual Activity  . Alcohol use: Yes     Alcohol/week: 0.0 standard drinks    Comment: rare  . Drug use: No  . Sexual activity: Never

## 2020-11-20 ENCOUNTER — Ambulatory Visit: Payer: PPO | Admitting: Family

## 2020-11-20 ENCOUNTER — Telehealth: Payer: Self-pay | Admitting: Family

## 2020-11-20 NOTE — Telephone Encounter (Signed)
Pt had an appt today with Melissa 11-20-2020 and had to be rescheduled, pt is wanting to know if possible to have someone from [facility] to pick up the oxygen machine since pt is not needing any more oxygen for her to use. Please advise.

## 2020-11-20 NOTE — Telephone Encounter (Signed)
Called [facility] to dc oxygen, they will come pick up tank and have patient sign a waver

## 2020-11-27 MED FILL — DICLOFENAC SODIUM 75 MG TAB: 75 | 15 days supply | Qty: 30 | Fill #1

## 2020-11-28 ENCOUNTER — Telehealth: Payer: Self-pay | Admitting: Pharmacist

## 2020-11-28 NOTE — Progress Notes (Addendum)
Chronic Care Management Pharmacy Assistant   Name: Jordan [name]  MRN: [id number] DOB: [birth year]  Reason for Encounter: HTN Disease State  Patient Questions:  1.  Have you seen any other providers since your last visit? Yes  2.  Any changes in your medicines or health? Yes    PCP : [name], NP   Their chronic conditions include: Tobacco Use, Pre-DM, HTN, HLD, Gerd, Gout, Depression, Osteoarthritis, Obesity.  Office Visits: 10-27-2020 (PCP): Patient presented in the office for Covid f/u. Patient reports continuous improvement since hospital discharge. Room air saturation with ambulation remained at 96%. She was advised to use O2 as needed. Labwork was ordered. No medication changes.  10-04-2020 (PCP) Video visit with PCP to discuss covid 19. C/o severe joint pain, weakness, nausea and dizziness. Prescribed a trial of Zofran and referred to antibody treatment, but she declined.  Consults: 11-14-2020 (Ortho) Patient presented in the office c/o left hip pain. X-ray of hip was taken. Patient prescribed Dicolfenac 75 mg tab; one tab two times a day  Hospitalizations: 10-08-2020: Patient presented in the ED with with hypoxia, fatigue, cough, and worsening shortness of breath. She reported being diagnosed with Covid after a trip to the beach.  Patient reports she has not had any chest pain but has had significantly worsening shortness of breath, cough, and fatigue.  Her shortness breath is exertional. Was admitted for significant hypoxia. CT scan showed pulmonary embolism. She was placed on oxygen, and high dose systemic steroids, remdesivir and supplemental oxygen during her stay. Discharged 10-13-2020.  10-01-2020 Patient went to the ED c/o arthralgias and nausea. Was positive for Covid for five days. Lab work and chest x-ray were completed. CMP shows BUN and creatinine within normal limits.  CBC shows no leukocytosis.  UA shows no evidence of infectious etiology.  This x-ray  negative for any acute infectious etiology. Patient was referred to monoclonal antibody infusion for further evaluation.  Allergies:  No Known Allergies  Medications: Outpatient Encounter Medications as of 11/28/2020  Medication Sig   acetaminophen (TYLENOL) 500 MG tablet Take 1,000 mg by mouth every 6 (six) hours as needed for fever.   allopurinol (ZYLOPRIM) 100 MG tablet TAKE 2 TABLETS BY MOUTH DAILY   aspirin EC 81 MG tablet Take 81 mg by mouth daily.   Cholecalciferol 25 MCG (1000 UT) capsule Take 1,000 Units by mouth daily.   diclofenac (VOLTAREN) 75 MG EC tablet Take 1 tablet (75 mg total) by mouth 2 (two) times daily.   DULoxetine (CYMBALTA) 60 MG capsule TAKE 1 CAPSULE BY MOUTH DAILY   furosemide (LASIX) 40 MG tablet TAKE 1 TABLET (40 MG TOTAL) BY MOUTH DAILY AS NEEDED. (Patient taking differently: Take 40 mg by mouth daily as needed for fluid. )   guaiFENesin-dextromethorphan (ROBITUSSIN DM) 100-10 MG/5ML syrup Take 5 mLs by mouth every 6 (six) hours as needed for cough.   hydrochlorothiazide (HYDRODIURIL) 25 MG tablet Take 1 tablet (25 mg total) by mouth daily.   Multiple Vitamin (MULTIVITAMIN WITH MINERALS) TABS tablet Take 1 tablet by mouth daily.   ondansetron (ZOFRAN) 4 MG tablet Take 1 tablet (4 mg total) by mouth every 8 (eight) hours as needed for nausea or vomiting.   pantoprazole (PROTONIX) 40 MG tablet TAKE 1 TABLET BY MOUTH DAILY.   [DISCONTINUED] famotidine (PEPCID AC) 10 MG chewable tablet Chew 10 mg by mouth daily.     No facility-administered encounter medications on file as of 11/28/2020.    Current Diagnosis: Patient  Active Problem List   Diagnosis Date Noted   Morbidly obese ([place]) 10/09/2020   Acute hypoxemic respiratory failure due to COVID-19 [facility]) 10/08/2020   SBO (small bowel obstruction) ([facility] [facility]) 12/01/2018   Hypokalemia 12/01/2018   Tobacco abuse 12/01/2018   Benign paroxysmal positional vertigo 07/25/2015   Routine general medical examination at a health  care facility 07/06/2012   GERD (gastroesophageal reflux disease) 01/03/2012   Hyperglycemia 05/08/2011   General medical examination 05/06/2011   Gout 02/21/2010   Degenerative disc disease, lumbar 07/11/2009   Osteoarthritis 02/19/2009   Hyperlipidemia 01/16/2009   Obesity, Class III, BMI 40-49.9 (morbid obesity) ([place] [facility]) 02/17/2008   Depression 09/04/2007   Essential hypertension 09/04/2007    Goals Addressed   None    Reviewed chart prior to disease state call. Spoke with patient regarding BP  Recent Office Vitals: BP Readings from Last 3 Encounters:  10/27/20 (!) 149/81  10/13/20 118/78  10/01/20 127/75   Pulse Readings from Last 3 Encounters:  10/27/20 87  10/13/20 70  10/01/20 84    Wt Readings from Last 3 Encounters:  11/14/20 250 lb (113.4 kg)  10/27/20 254 lb (115.2 kg)  10/08/20 254 lb 10.1 oz (115.5 kg)     Kidney Function Lab Results  Component Value Date/Time   CREATININE 1.14 (H) 10/27/2020 10:45 AM   CREATININE 1.01 (H) 10/13/2020 04:14 AM   CREATININE 0.83 10/12/2020 04:21 AM   CREATININE 1.08 04/25/2014 11:04 AM   GFR 51.43 (L) 07/20/2019 11:10 AM   GFRNONAA 60 (L) 10/13/2020 04:14 AM   GFRNONAA 57 (L) 10/25/2013 10:42 AM   GFRAA >60 12/06/2018 05:45 AM   GFRAA 66 10/25/2013 10:42 AM    BMP Latest Ref Rng & Units 10/27/2020 10/13/2020 10/12/2020  Glucose 65 - 99 mg/dL 121(H) 112(H) 184(H)  BUN 7 - 25 mg/dL 22 33(H) 31(H)  Creatinine 0.60 - 0.93 mg/dL 1.14(H) 1.01(H) 0.83  BUN/Creat Ratio 6 - 22 (calc) 19 - -  Sodium 135 - 146 mmol/L 133(L) 138 135  Potassium 3.5 - 5.3 mmol/L 4.2 3.9 4.4  Chloride 98 - 110 mmol/L 91(L) 104 101  CO2 20 - 32 mmol/L 33(H) 25 24  Calcium 8.6 - 10.4 mg/dL 10.0 8.5(L) 8.5(L)    Current antihypertensive regimen:  Furosemide 40mg  PRN   Third unsuccessful telephone outreach was attempted today. The patient was referred to the pharmacist for assistance with care management and care coordination. The patient's  telephone goes straight to voicemail. I have left several voicemails with no success   Follow-Up:  Pharmacist Review   Fanny Skates, [facility] Pharmacist Assistant [phone]  Reviewed by: De Blanch, PharmD, BCACP Clinical Pharmacist [facility] [phone]

## 2020-12-05 ENCOUNTER — Other Ambulatory Visit: Payer: Self-pay

## 2020-12-05 ENCOUNTER — Ambulatory Visit (INDEPENDENT_AMBULATORY_CARE_PROVIDER_SITE_OTHER): Payer: PPO | Admitting: Family

## 2020-12-05 VITALS — BP 138/88 | HR 79 | Temp 97.8°F | Resp 16 | Ht 64.0 in | Wt 257.0 lb

## 2020-12-05 DIAGNOSIS — E871 Hypo-osmolality and hyponatremia: Secondary | ICD-10-CM

## 2020-12-05 DIAGNOSIS — Z8616 Personal history of COVID-19: Secondary | ICD-10-CM | POA: Diagnosis not present

## 2020-12-05 DIAGNOSIS — I10 Essential (primary) hypertension: Secondary | ICD-10-CM | POA: Diagnosis not present

## 2020-12-05 LAB — BASIC METABOLIC PANEL
BUN: 27 mg/dL — ABNORMAL HIGH (ref 6–23)
CO2: 25 mEq/L (ref 19–32)
Calcium: 9.1 mg/dL (ref 8.4–10.5)
Chloride: 105 mEq/L (ref 96–112)
Creatinine, Ser: 0.95 mg/dL (ref 0.40–1.20)
GFR: 60.83 mL/min (ref >60.00)
Glucose, Bld: 106 mg/dL — ABNORMAL HIGH (ref 70–99)
Potassium: 4.1 mEq/L (ref 3.5–5.1)
Sodium: 140 mEq/L (ref 135–145)

## 2020-12-05 NOTE — Progress Notes (Signed)
Subjective:    Patient ID: Jordan [name], female    DOB: 12-22-1951, 69 y.o.   MRN: [id number]  HPI  Last visit sodium was noted to be low.  We made the following adjustments to her medications: diazide was changed to hctz 25mg .  Still sob some days following covid-19 infection. BP Readings from Last 3 Encounters:  12/05/20 138/88  10/27/20 (!) 149/81  10/13/20 118/78        Review of Systems    see HPI  Past Medical History:  Diagnosis Date   Cellulitis and abscess of finger, unspecified 401.9   Cellulitis and abscess of foot, except toes    Depressive disorder, not elsewhere classified    GERD (gastroesophageal reflux disease)    Gout, unspecified    Hyperglycemia 05/08/2011   Hyperlipidemia    borderline- not on meds   Hypertension    Insomnia    Leg edema    chronic   Obesity, unspecified    Osteoarthritis    Renal insufficiency, mild 05/08/2011   Small bowel obstruction (HCC)      Social History   Socioeconomic History   Marital status: Single    Spouse name: Not on file   Number of children: 2   Years of education: Not on file   Highest education level: Not on file  Occupational History   Occupation: works prn at [employer]    Employer: [employer]  Tobacco Use   Smoking status: Current Some Day Smoker    Packs/day: 0.25    Types: Cigarettes    Last attempt to quit: 12/26/2014    Years since quitting: 5.9   Smokeless tobacco: Never Used  Vaping Use   Vaping Use: Never used  Substance and Sexual Activity   Alcohol use: Yes    Alcohol/week: 0.0 standard drinks    Comment: rare   Drug use: No   Sexual activity: Never  Other Topics Concern   Not on file  Social History Narrative   2 children (daughter and son) both local. 3 grandchildren, one great grandchild   Works at ED front desk   Divorced (married x 30 years)   Social Determinants of Health   Financial Resource Strain: Not on file  Food Insecurity: Not on file   Transportation Needs: Not on file  Physical Activity: Not on file  Stress: Not on file  Social Connections: Not on file  Intimate Partner Violence: Not on file    Past Surgical History:  Procedure Laterality Date   ABDOMINAL HYSTERECTOMY  2002   infection- no history of cancer   APPENDECTOMY  2002   catacact Right 11/01/2019   [place]   right shoulder rotator cuff repair  06-07-10   TOTAL HIP ARTHROPLASTY Left 1998    Family History  Problem Relation Age of Onset   Diabetes Mother    Cancer Mother        colon/ pancreatic   Colon cancer Mother 41   Hypertension Father    Alcohol abuse Father    Cirrhosis Father    Other Sister        Pyeoderma gangrenosis   Obesity Daughter    Diabetes Sister        type 2   Kidney failure Sister    Liver disease Sister    Heart attack Brother    Cirrhosis Sister        had NASH, died heart failure  No Known Allergies  Current Outpatient Medications on File Prior to Visit  Medication Sig Dispense Refill   allopurinol (ZYLOPRIM) 100 MG tablet TAKE 2 TABLETS BY MOUTH DAILY 180 tablet 1   aspirin EC 81 MG tablet Take 81 mg by mouth daily.     Cholecalciferol 25 MCG (1000 UT) capsule Take 1,000 Units by mouth daily.     diclofenac (VOLTAREN) 75 MG EC tablet Take 1 tablet (75 mg total) by mouth 2 (two) times daily. 30 tablet 2   DULoxetine (CYMBALTA) 60 MG capsule TAKE 1 CAPSULE BY MOUTH DAILY 90 capsule 1   furosemide (LASIX) 40 MG tablet TAKE 1 TABLET (40 MG TOTAL) BY MOUTH DAILY AS NEEDED. (Patient taking differently: Take 40 mg by mouth daily as needed for fluid.) 90 tablet 1   hydrochlorothiazide (HYDRODIURIL) 25 MG tablet Take 1 tablet (25 mg total) by mouth daily. 90 tablet 1   Multiple Vitamin (MULTIVITAMIN WITH MINERALS) TABS tablet Take 1 tablet by mouth daily.     pantoprazole (PROTONIX) 40 MG tablet TAKE 1 TABLET BY MOUTH DAILY. 90 tablet 1    acetaminophen (TYLENOL) 500 MG tablet Take 1,000 mg by mouth every 6 (six) hours as needed for fever.     guaiFENesin-dextromethorphan (ROBITUSSIN DM) 100-10 MG/5ML syrup Take 5 mLs by mouth every 6 (six) hours as needed for cough. 118 mL 0   ondansetron (ZOFRAN) 4 MG tablet Take 1 tablet (4 mg total) by mouth every 8 (eight) hours as needed for nausea or vomiting. 20 tablet 0   [DISCONTINUED] famotidine (PEPCID AC) 10 MG chewable tablet Chew 10 mg by mouth daily.       No current facility-administered medications on file prior to visit.    BP 138/88    Pulse 79    Temp 97.8 F (36.6 C) (Oral)    Resp 16    Ht 5\' 4"  (1.626 m)    Wt 257 lb (116.6 kg)    SpO2 98%    BMI 44.11 kg/m    Objective:   Physical Exam Constitutional:      Appearance: She is well-developed and well-nourished.  Neck:     Thyroid: No thyromegaly.  Cardiovascular:     Rate and Rhythm: Normal rate and regular rhythm.     Heart sounds: Normal heart sounds. No murmur heard.   Pulmonary:     Effort: Pulmonary effort is normal. No respiratory distress.     Breath sounds: Normal breath sounds. No wheezing.  Musculoskeletal:     Cervical back: Neck supple.  Skin:    General: Skin is warm and dry.  Neurological:     Mental Status: She is alert and oriented to person, place, and time.  Psychiatric:        Mood and Affect: Mood and affect normal.        Behavior: Behavior normal.        Thought Content: Thought content normal.        Judgment: Judgment normal.           Assessment & Plan:  HTN- repeat BP at goal.  Continue hctz.  Hyponatremia- obtain follow up sodium level.   Hx of covid-19- overall stable. Lungs clear.oxygen 98%.  Monitor.   This visit occurred during the SARS-CoV-2 public health emergency.  Safety protocols were in place, including screening questions prior to the visit, additional usage of staff PPE, and extensive cleaning of exam room while observing appropriate contact time as  indicated for disinfecting  solutions.

## 2020-12-05 NOTE — Patient Instructions (Signed)
Please complete lab work prior to leaving.   

## 2020-12-14 MED FILL — DICLOFENAC SODIUM 75 MG TAB: 75 | 15 days supply | Qty: 30 | Fill #2

## 2021-01-15 ENCOUNTER — Other Ambulatory Visit: Payer: Self-pay | Admitting: Physician Assistant

## 2021-01-15 ENCOUNTER — Other Ambulatory Visit: Payer: Self-pay | Admitting: Orthopaedic Surgery

## 2021-01-15 MED FILL — ALLOPURINOL 100 MG TABS: 100 | 90 days supply | Qty: 180 | Fill #1

## 2021-01-15 MED FILL — HYDROCHLOROTHIAZIDE 25 MG T: 25 | 90 days supply | Qty: 90 | Fill #1

## 2021-01-15 MED FILL — PANTOPRAZOLE SOD DR 40 MG T: 40 | 90 days supply | Qty: 90 | Fill #1

## 2021-01-15 MED FILL — DICLOFENAC SODIUM 75 MG TAB: 75 | 15 days supply | Qty: 30 | Fill #0

## 2021-01-30 ENCOUNTER — Ambulatory Visit: Payer: PPO | Admitting: Family

## 2021-02-08 MED FILL — DULOXETINE HCL 60 MG CPEP: 60 | 90 days supply | Qty: 90 | Fill #1

## 2021-02-22 MED FILL — DICLOFENAC SODIUM 75 MG TAB: 75 | 15 days supply | Qty: 30 | Fill #1

## 2021-02-26 NOTE — Progress Notes (Deleted)
Chronic Care Management Pharmacy Note  02/26/2021 Name:  Jordan [name] MRN:  [id number] DOB:  [birth year]  Subjective: Jordan [name] is an 70 y.o. year old female who is a primary patient of [name], NP.  The CCM team was consulted for assistance with disease management and care coordination needs.    Engaged with patient by telephone for follow up visit in response to provider referral for pharmacy case management and/or care coordination services.   Consent to Services:  The patient was given the following information about Chronic Care Management services today, agreed to services, and gave verbal consent: 1. CCM service includes personalized support from designated clinical staff supervised by the primary care provider, including individualized plan of care and coordination with other care providers 2. 24/7 contact phone numbers for assistance for urgent and routine care needs. 3. Service will only be billed when office clinical staff spend 20 minutes or more in a month to coordinate care. 4. Only one practitioner may furnish and bill the service in a calendar month. 5.The patient may stop CCM services at any time (effective at the end of the month) by phone call to the office staff. 6. The patient will be responsible for cost sharing (co-pay) of up to 20% of the service fee (after annual deductible is met). Patient agreed to services and consent obtained.  Patient Care Team: [name], NP as PCP - General Day, Melvenia Beam, [facility] (Inactive) as Pharmacist (Pharmacist)  Recent office visits: 12/05/20 [facility]da [facility]) - no med changes, sodium level drawn and it has normalized.  10/27/20 Inda [facility]) - Follow up post discharge from hospital from COVID-19.  No changes regular labs drawn.  Recent consult visits: 11/14/20 Erlinda Hong, Ortho) - L hip pain  Hospital visits: 10/01/20 (ED) - diagnosed with COVID-19  Objective:  Lab Results  Component Value Date   CREATININE 0.95  12/05/2020   BUN 27 (H) 12/05/2020   GFR 60.83 12/05/2020   GFRNONAA 60 (L) 10/13/2020   GFRAA >60 12/06/2018   NA 140 12/05/2020   K 4.1 12/05/2020   CALCIUM 9.1 12/05/2020   CO2 25 12/05/2020    Lab Results  Component Value Date/Time   HGBA1C 6.2 (H) 10/27/2020 10:45 AM   HGBA1C 6.2 07/20/2019 11:10 AM   GFR 60.83 12/05/2020 10:49 AM   GFR 51.43 (L) 07/20/2019 11:10 AM   MICROALBUR <0.7 06/10/2018 10:43 AM    Last diabetic Eye exam: No results found for: HMDIABEYEEXA  Last diabetic Foot exam: No results found for: HMDIABFOOTEX   Lab Results  Component Value Date   CHOL 190 07/20/2019   HDL 50.10 07/20/2019   LDLCALC 93 06/10/2018   LDLDIRECT 111.0 07/20/2019   TRIG 105 10/08/2020   CHOLHDL 4 07/20/2019    Hepatic Function Latest Ref Rng & Units 10/27/2020 10/13/2020 10/12/2020  Total Protein 6.1 - 8.1 g/dL 6.8 5.6(L) 5.8(L)  Albumin 3.5 - 5.0 g/dL - 2.9(L) 2.9(L)  AST 10 - 35 U/L 27 25 45(H)  ALT 6 - 29 U/L 35(H) 71(H) 90(H)  Alk Phosphatase 38 - 126 U/L - 69 70  Total Bilirubin 0.2 - 1.2 mg/dL 0.6 0.6 0.8  Bilirubin, Direct 0.0 - 0.3 mg/dL - - -    Lab Results  Component Value Date/Time   TSH 3.46 08/11/2015 10:23 AM   TSH 1.956 10/25/2013 10:42 AM    CBC Latest Ref Rng & Units 10/27/2020 10/11/2020 10/10/2020  WBC 3.8 - 10.8 Thousand/uL 6.4 8.1 8.2  Hemoglobin 11.7 - 15.5 g/dL  14.3 13.4 13.3  Hematocrit 35.0 - 45.0 % 42.2 39.1 38.8  Platelets 140 - 400 Thousand/uL 171 322 314    No results found for: VD25OH  Clinical ASCVD: {YES/NO:21197} The 10-year ASCVD risk score Mikey Bussing DC Jr., et al., 2013) is: 39.5%   Values used to calculate the score:     Age: 65 years     Sex: Female     Is Non-Hispanic African American: No     Diabetic: Yes     Tobacco smoker: Yes     Systolic Blood Pressure: 409 mmHg     Is BP treated: Yes     HDL Cholesterol: 50.1 mg/dL     Total Cholesterol: 190 mg/dL    Depression screen [facility] 2/9 08/31/2020 10/21/2019 12/11/2018   Decreased Interest 0 0 0  Down, Depressed, Hopeless 1 0 0  PHQ - 2 Score 1 0 0  Altered sleeping 0 - 0  Tired, decreased energy 0 - 0  Change in appetite 1 - 0  Feeling bad or failure about yourself  0 - 0  Trouble concentrating 0 - 0  Moving slowly or fidgety/restless 0 - 0  Suicidal thoughts 0 - 0  PHQ-9 Score 2 - 0  Difficult doing work/chores - - -     ***Other: (CHADS2VASc if Afib, MMRC or CAT for COPD, ACT, DEXA)  Social History   Tobacco Use  Smoking Status Current Some Day Smoker  . Packs/day: 0.25  . Types: Cigarettes  . Last attempt to quit: 12/26/2014  . Years since quitting: 6.1  Smokeless Tobacco Never Used   BP Readings from Last 3 Encounters:  12/05/20 138/88  10/27/20 (!) 149/81  10/13/20 118/78   Pulse Readings from Last 3 Encounters:  12/05/20 79  10/27/20 87  10/13/20 70   Wt Readings from Last 3 Encounters:  12/05/20 257 lb (116.6 kg)  11/14/20 250 lb (113.4 kg)  10/27/20 254 lb (115.2 kg)    Assessment/Interventions: Review of patient past medical history, allergies, medications, health status, including review of consultants reports, laboratory and other test data, was performed as part of comprehensive evaluation and provision of chronic care management services.   SDOH:  (Social Determinants of Health) assessments and interventions performed: {yes/no:20286}   CCM Care Plan  No Known Allergies  Medications Reviewed Today    Reviewed by Jiles Prows, CMA (Certified Medical Assistant) on 12/05/20 at 76  Med List Status: <None>  Medication Order Taking? Sig Documenting Provider Last Dose Status Informant  acetaminophen (TYLENOL) 500 MG tablet [id number]  Take 1,000 mg by mouth every 6 (six) hours as needed for fever. [provider]  Active Self  allopurinol (ZYLOPRIM) 100 MG tablet [id number] Yes TAKE 2 TABLETS BY MOUTH DAILY [name], NP Taking Active   aspirin EC 81 MG tablet [id number] Yes Take 81 mg by mouth daily.  [provider] Taking Active Self  Cholecalciferol 25 MCG (1000 UT) capsule [id number] Yes Take 1,000 Units by mouth daily. [provider] Taking Active Self  diclofenac (VOLTAREN) 75 MG EC tablet [id number] Yes Take 1 tablet (75 mg total) by mouth 2 (two) times daily. Leandrew Koyanagi, MD Taking Active   DULoxetine (CYMBALTA) 60 MG capsule [id number] Yes TAKE 1 CAPSULE BY MOUTH DAILY [name], NP Taking Active         Discontinued 03/03/12 0914   furosemide (LASIX) 40 MG tablet [id number] Yes TAKE 1 TABLET (40 MG TOTAL) BY MOUTH DAILY AS NEEDED.  Patient taking  differently: Take 40 mg by mouth daily as needed for fluid.   [name], NP Taking Active Self  guaiFENesin-dextromethorphan (ROBITUSSIN DM) 100-10 MG/5ML syrup [id number]  Take 5 mLs by mouth every 6 (six) hours as needed for cough. Arrien, Jimmy Picket, MD  Active   hydrochlorothiazide (HYDRODIURIL) 25 MG tablet [id number] Yes Take 1 tablet (25 mg total) by mouth daily. [name], NP Taking Active   Multiple Vitamin (MULTIVITAMIN WITH MINERALS) TABS tablet [id number] Yes Take 1 tablet by mouth daily. [provider] Taking Active Self  ondansetron (ZOFRAN) 4 MG tablet [id number]  Take 1 tablet (4 mg total) by mouth every 8 (eight) hours as needed for nausea or vomiting. [name], NP  Active Self  pantoprazole (PROTONIX) 40 MG tablet [id number] Yes TAKE 1 TABLET BY MOUTH DAILY. [name], NP Taking Active           Patient Active Problem List   Diagnosis Date Noted  . Morbidly obese ([place]) 10/09/2020  . Acute hypoxemic respiratory failure due to COVID-19 ([place]) 10/08/2020  . SBO (small bowel obstruction) ([place]) 12/01/2018  . Hypokalemia 12/01/2018  . Tobacco abuse 12/01/2018  . Benign paroxysmal positional vertigo 07/25/2015  . Routine general medical examination at a health care facility 07/06/2012  . GERD (gastroesophageal reflux disease) 01/03/2012  .  Hyperglycemia 05/08/2011  . General medical examination 05/06/2011  . Gout 02/21/2010  . Degenerative disc disease, lumbar 07/11/2009  . Osteoarthritis 02/19/2009  . Hyperlipidemia 01/16/2009  . Obesity, Class III, BMI 40-49.9 (morbid obesity) ([place]) 02/17/2008  . Depression 09/04/2007  . Essential hypertension 09/04/2007    Immunization History  Administered Date(s) Administered  . Fluad Quad(high Dose 65+) 10/07/2019, 10/27/2020  . Influenza Whole 10/12/2009  . Influenza, High Dose Seasonal PF 09/02/2017, 09/07/2018  . Influenza-Unspecified 09/21/2013, 08/23/2014, 09/22/2016  . PFIZER(Purple Top)SARS-COV-2 Vaccination 07/26/2020, 08/16/2020  . Pneumococcal Conjugate-13 09/04/2016  . Pneumococcal Polysaccharide-23 12/23/2013, 10/07/2019  . Td 12/23/2004  . Tdap 08/11/2015  . Zoster 06/19/2011    Conditions to be addressed/monitored:  Tobacco Use, Pre-DM, HTN, HLD, Gerd, Gout, Depression, Osteoarthritis, Obesity   There are no care plans that you recently modified to display for this patient.    Medication Assistance: {MEDASSISTANCEINFO:25044}  Patient's preferred pharmacy is:  [facility] [place], [address] [place] [address] Phone: [phone] Fax: [phone]  Uses pill box? {Yes or If no, why not?:20788} Pt endorses ***% compliance  We discussed: {Pharmacy options:24294} Patient decided to: {US Pharmacy Plan:23885}  Care Plan and Follow Up Patient Decision:  {FOLLOWUP:24991}  Plan: {CM FOLLOW UP PLAN:25073}  *** Current Barriers:  . {pharmacybarriers:24917} . ***  Pharmacist Clinical Goal(s):  Marland Kitchen Over the next *** days, patient will {PHARMACYGOALCHOICES:24921} through collaboration with PharmD and provider.  . ***  Interventions: . 1:1 collaboration with [name], NP regarding development and update of comprehensive plan of care as evidenced by provider  attestation and co-signature . Inter-disciplinary care team collaboration (see longitudinal plan of care) . Comprehensive medication review performed; medication list updated in electronic medical record  Hypertension (BP goal {CHL HP UPSTREAM Pharmacist BP [phone]}) -{US controlled/uncontrolled:25276} -Current treatment:  Furosemide 55m PRN -Medications previously tried: ***  -Current home readings: *** -Current dietary habits: *** -Current exercise habits: *** -{ACTIONS;DENIES/REPORTS:21021675::"Denies"} hypotensive/hypertensive symptoms -Educated on {CCM BP Counseling:25124} -Counseled to monitor BP at home ***, document, and provide log at future appointments -{CCMPHARMDINTERVENTION:25122}  Hyperlipidemia: (LDL goal < ***) -{US  controlled/uncontrolled:25276} -Current treatment:  None -Medications previously tried: ***  -Current dietary patterns: *** -Current exercise habits: *** -Educated on {CCM HLD Counseling:25126} -{CCMPHARMDINTERVENTION:25122}  Depression/Anxiety (Goal: ***) -{US controlled/uncontrolled:25276} -Current treatment:  Duloxetine 81m daily -Medications previously tried/failed: *** -PHQ9: *** -GAD7: *** -Connected with *** for mental health support -Educated on {CCM mental health counseling:25127} -{CCMPHARMDINTERVENTION:25122}  Tobacco use (Goal ***) -{US controlled/uncontrolled:25276} -Previous quit attempts: *** -Current treatment  . *** -Patient smokes {Time to first cigarette:23873} -Patient triggers include: {Smoking Triggers:23882} -On a scale of 1-10, reports MOTIVATION to quit is *** -On a scale of 1-10, reports CONFIDENCE in quitting is *** -{Smoking Cessation Counseling:23883} -{CCMPHARMDINTERVENTION:25122}   Patient Goals/Self-Care Activities . Over the next *** days, patient will:  - {pharmacypatientgoals:24919}  Follow Up Plan: {CM FOLLOW UP PQASU:01561}

## 2021-02-28 ENCOUNTER — Telehealth: Payer: PPO

## 2021-03-05 ENCOUNTER — Ambulatory Visit: Payer: PPO | Admitting: Family

## 2021-03-09 ENCOUNTER — Other Ambulatory Visit (INDEPENDENT_AMBULATORY_CARE_PROVIDER_SITE_OTHER): Payer: PPO

## 2021-03-09 ENCOUNTER — Other Ambulatory Visit: Payer: Self-pay | Admitting: Family

## 2021-03-09 ENCOUNTER — Ambulatory Visit (INDEPENDENT_AMBULATORY_CARE_PROVIDER_SITE_OTHER): Payer: PPO | Admitting: Family

## 2021-03-09 ENCOUNTER — Other Ambulatory Visit: Payer: Self-pay

## 2021-03-09 VITALS — BP 145/73 | HR 84 | Temp 98.0°F | Resp 16 | Ht 64.0 in | Wt 268.0 lb

## 2021-03-09 DIAGNOSIS — I10 Essential (primary) hypertension: Secondary | ICD-10-CM

## 2021-03-09 DIAGNOSIS — K2100 Gastro-esophageal reflux disease with esophagitis, without bleeding: Secondary | ICD-10-CM

## 2021-03-09 DIAGNOSIS — E039 Hypothyroidism, unspecified: Secondary | ICD-10-CM

## 2021-03-09 DIAGNOSIS — R5383 Other fatigue: Secondary | ICD-10-CM

## 2021-03-09 DIAGNOSIS — F320 Major depressive disorder, single episode, mild: Secondary | ICD-10-CM

## 2021-03-09 DIAGNOSIS — M109 Gout, unspecified: Secondary | ICD-10-CM | POA: Diagnosis not present

## 2021-03-09 DIAGNOSIS — R739 Hyperglycemia, unspecified: Secondary | ICD-10-CM

## 2021-03-09 LAB — COMPREHENSIVE METABOLIC PANEL
ALT: 39 U/L — ABNORMAL HIGH (ref 0–35)
AST: 29 U/L (ref 0–37)
Albumin: 4.5 g/dL (ref 3.5–5.2)
Alkaline Phosphatase: 91 U/L (ref 39–117)
BUN: 27 mg/dL — ABNORMAL HIGH (ref 6–23)
CO2: 24 mEq/L (ref 19–32)
Calcium: 9.8 mg/dL (ref 8.4–10.5)
Chloride: 103 mEq/L (ref 96–112)
Creatinine, Ser: 1.24 mg/dL — ABNORMAL HIGH (ref 0.40–1.20)
GFR: 44.11 mL/min — ABNORMAL LOW (ref >60.00)
Glucose, Bld: 128 mg/dL — ABNORMAL HIGH (ref 70–99)
Potassium: 4 mEq/L (ref 3.5–5.1)
Sodium: 139 mEq/L (ref 135–145)
Total Bilirubin: 0.4 mg/dL (ref 0.2–1.2)
Total Protein: 6.9 g/dL (ref 6.0–8.3)

## 2021-03-09 LAB — HEMOGLOBIN A1C: Hgb A1c MFr Bld: 6.4 % (ref 4.6–6.5)

## 2021-03-09 LAB — TSH: TSH: 9.31 u[IU]/mL — ABNORMAL HIGH (ref 0.35–4.50)

## 2021-03-09 LAB — T4, FREE: Free T4: 0.75 ng/dL (ref 0.60–1.60)

## 2021-03-09 LAB — T3, FREE: T3, Free: 7.4 pg/mL — ABNORMAL HIGH (ref 2.3–4.2)

## 2021-03-09 MED ORDER — DULOXETINE HCL 60 MG PO CPEP: 60.0000 mg | ORAL_CAPSULE | Freq: Every day | ORAL | 1 refills | Status: DC

## 2021-03-09 MED ORDER — FUROSEMIDE 40 MG PO TABS: 40.0000 mg | ORAL_TABLET | Freq: Every day | ORAL | 1 refills | Status: DC | PRN

## 2021-03-09 MED ORDER — PANTOPRAZOLE SODIUM 40 MG PO TBEC: 40.0000 mg | DELAYED_RELEASE_TABLET | Freq: Every day | ORAL | 1 refills | Status: DC

## 2021-03-09 MED ORDER — ALLOPURINOL 100 MG PO TABS: 200.0000 mg | ORAL_TABLET | Freq: Every day | ORAL | 1 refills | Status: DC

## 2021-03-09 MED ORDER — HYDROCHLOROTHIAZIDE 25 MG PO TABS: 25.0000 mg | ORAL_TABLET | Freq: Every day | ORAL | 1 refills | Status: DC

## 2021-03-09 NOTE — Progress Notes (Signed)
Can you please ask lab to add on free t3 and free t4, dx hypothryoid?

## 2021-03-09 NOTE — Patient Instructions (Signed)
Please complete lab work prior to leaving.   

## 2021-03-09 NOTE — Progress Notes (Signed)
Subjective:    Patient ID: Jordan [name], female    DOB: [birth year], 70 y.o.   MRN: [id number]  HPI   Patient is a 70 yr old female who presents today for follow up.    HTN- on hctz 25mg  once daily- has not yet taken this AM.   Reports that she has LE edema if she works 12 hr shifts.   BP Readings from Last 3 Encounters:  03/09/21 (!) 145/73  12/05/20 138/88  10/27/20 (!) 149/81   Depression- reports mood is OK on cymbalta 60mg .  Feels like the winter is more difficult for her.   Reports ongoing fatigue since she had covid.  GERD- on protonix 40mg . Reports symptoms are stable.  Gout- continues allopurinol. No recent flares.     Review of Systems See HPI  Past Medical History:  Diagnosis Date  . Cellulitis and abscess of finger, unspecified 401.9  . Cellulitis and abscess of foot, except toes   . Depressive disorder, not elsewhere classified   . GERD (gastroesophageal reflux disease)   . Gout, unspecified   . Hyperglycemia 05/08/2011  . Hyperlipidemia    borderline- not on meds  . Hypertension   . Insomnia   . Leg edema    chronic  . Obesity, unspecified   . Osteoarthritis   . Renal insufficiency, mild 05/08/2011  . Small bowel obstruction (HCC)      Social History   Socioeconomic History  . Marital status: Single    Spouse name: Not on file  . Number of children: 2  . Years of education: Not on file  . Highest education level: Not on file  Occupational History  . Occupation: works prn at [employer]  Tobacco Use  . Smoking status: Current Some Day Smoker    Packs/day: 0.25    Types: Cigarettes    Last attempt to quit: 12/26/2014    Years since quitting: 6.2  . Smokeless tobacco: Never Used  Vaping Use  . Vaping Use: Never used  Substance and Sexual Activity  . Alcohol use: Yes    Alcohol/week: 0.0 standard drinks    Comment: rare  . Drug use: No  . Sexual activity: Never  Other Topics Concern  . Not on file  Social History  Narrative   2 children (daughter and son) both local. 3 grandchildren, one great grandchild   Works at ED front desk   Divorced (married x 30 years)   Social Determinants of Radio broadcast assistant Strain: Not on file  Food Insecurity: Not on file  Transportation Needs: Not on file  Physical Activity: Not on file  Stress: Not on file  Social Connections: Not on file  Intimate Partner Violence: Not on file    Past Surgical History:  Procedure Laterality Date  . ABDOMINAL HYSTERECTOMY  2002   infection- no history of cancer  . APPENDECTOMY  2002  . catacact Right 11/01/2019  . CHOLECYSTECTOMY  1977  . ECTOPIC PREGNANCY SURGERY  1971  . right shoulder rotator cuff repair  06-07-10  . TOTAL HIP ARTHROPLASTY Left 1998    Family History  Problem Relation Age of Onset  . Diabetes Mother   . Cancer Mother        colon/ pancreatic  . Colon cancer Mother 67  . Hypertension Father   . Alcohol abuse Father   . Cirrhosis Father   . Other Sister        Pyeoderma gangrenosis  .  Obesity Daughter   . Diabetes Sister        type 2  . Kidney failure Sister   . Liver disease Sister   . Heart attack Brother   . Cirrhosis Sister        had NASH, died heart failure    No Known Allergies  Current Outpatient Medications on File Prior to Visit  Medication Sig Dispense Refill  . allopurinol (ZYLOPRIM) 100 MG tablet TAKE 2 TABLETS BY MOUTH DAILY 180 tablet 1  . aspirin EC 81 MG tablet Take 81 mg by mouth daily.    . Cholecalciferol 25 MCG (1000 UT) capsule Take 1,000 Units by mouth daily.    . diclofenac (VOLTAREN) 75 MG EC tablet TAKE 1 TABLET (75 MG TOTAL) BY MOUTH 2 (TWO) TIMES DAILY. 30 tablet 2  . DULoxetine (CYMBALTA) 60 MG capsule TAKE 1 CAPSULE BY MOUTH DAILY 90 capsule 1  . furosemide (LASIX) 40 MG tablet TAKE 1 TABLET (40 MG TOTAL) BY MOUTH DAILY AS NEEDED. (Patient taking differently: Take 40 mg by mouth daily as needed for fluid.) 90 tablet 1  . hydrochlorothiazide  (HYDRODIURIL) 25 MG tablet Take 1 tablet (25 mg total) by mouth daily. 90 tablet 1  . Multiple Vitamin (MULTIVITAMIN WITH MINERALS) TABS tablet Take 1 tablet by mouth daily.    . pantoprazole (PROTONIX) 40 MG tablet TAKE 1 TABLET BY MOUTH DAILY. 90 tablet 1  . [DISCONTINUED] famotidine (PEPCID AC) 10 MG chewable tablet Chew 10 mg by mouth daily.       No current facility-administered medications on file prior to visit.    BP (!) 145/73 (BP Location: Right Arm, Patient Position: Sitting, Cuff Size: Large)   Pulse 84   Temp 98 F (36.7 C) (Oral)   Resp 16   Ht 5\' 4"  (1.626 m)   Wt 268 lb (121.6 kg)   SpO2 97%   BMI 46.00 kg/m       Objective:   Physical Exam Constitutional:      Appearance: She is well-developed.  Cardiovascular:     Rate and Rhythm: Normal rate and regular rhythm.     Heart sounds: Normal heart sounds. No murmur heard.   Pulmonary:     Effort: Pulmonary effort is normal. No respiratory distress.     Breath sounds: Normal breath sounds. No wheezing.  Psychiatric:        Behavior: Behavior normal.        Thought Content: Thought content normal.        Judgment: Judgment normal.           Assessment & Plan:  HTN-  bp mildly elevated- has not yet taken hctz.  Just finished night shift. Obtain bmet. Continue hctz 25mg  once daily.  Gout- stable on allopurinol 100mg .  GERD- stable. Continue protonix 40mg  once daily.    Depression- stable. Continue cymbalta 60mg .  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 Will check TSH. Had normal CBC post covid.   This visit occurred during the SARS-CoV-2 public health emergency.  Safety protocols were in place, including screening questions prior to the visit, additional usage of staff PPE, and extensive cleaning of exam room while observing appropriate contact time as indicated for disinfecting solutions.

## 2021-03-12 ENCOUNTER — Telehealth: Payer: Self-pay | Admitting: Family

## 2021-03-12 DIAGNOSIS — E039 Hypothyroidism, unspecified: Secondary | ICD-10-CM

## 2021-03-12 NOTE — Telephone Encounter (Signed)
Please advise pt that her sugar is stable.  Liver function is stable. Kidney function is slightly worse than the last time we checked it.  Her lab work is showing possible underactive thyroid. I would like for her to meet with endocrinology for further evaluation.

## 2021-03-12 NOTE — Telephone Encounter (Signed)
Patient advised of results and referral to endo.

## 2021-03-21 ENCOUNTER — Telehealth: Payer: Self-pay | Admitting: Pharmacist

## 2021-03-21 NOTE — Progress Notes (Addendum)
    Chronic Care Management Pharmacy Assistant   Name: Jordan [name]  MRN: [id number] DOB: 1951-10-18  Reason for Encounter:Adherencce Review  Reviewed the patients chart for any medical/health changes and/or medication changes. The patient has STOPPED Acetaminophen, Dextromethorphan-Guaifenesin, and Ondansetron.    Follow-Up: Pharmacist Review  Charlann Lange, [place] Pharmacist Assistant [phone]

## 2021-03-28 ENCOUNTER — Other Ambulatory Visit (HOSPITAL_BASED_OUTPATIENT_CLINIC_OR_DEPARTMENT_OTHER): Payer: Self-pay

## 2021-03-28 MED FILL — Diclofenac Sodium Tab Delayed Release 75 MG: ORAL | 15 days supply | Qty: 30 | Fill #0 | Status: AC

## 2021-04-09 NOTE — Progress Notes (Signed)
Name: Jordan [name]  MRN/ DOB: [id number], 03/01/1951    Age/ Sex: 70 y.o., female    PCP: [name], NP   Reason for Endocrinology Evaluation: Hypothyroidism     Date of Initial Endocrinology Evaluation: 04/10/2021     HPI: [name] is a 70 y.o. female with a past medical history of HTN, Gout, GERD and depression. The patient presented for initial endocrinology clinic visit on 04/10/2021 for consultative assistance with her Hypothyroidism.   During evaluation for fatigue she was noted to have a TSH of 9.31 uIU/mL in 02/2021 with normal FT4 at 0.75 ng/dL and elevated FT3 at 7.4 pg/mL   Of note, the pt had COVID infection in 09/2020 requiring steroid intake   She has been noted with weight gain  Has back neck pain Denies constipation but has loose bowels Continues with fatigue and mood swings  Snores at night, no prior diagnosis for sleep apnea   No biotin intake  No prior exposure to radiation  No FH of thyroid disease    HISTORY:  Past Medical History:  Past Medical History:  Diagnosis Date  . Cellulitis and abscess of finger, unspecified 401.9  . Cellulitis and abscess of foot, except toes   . Depressive disorder, not elsewhere classified   . GERD (gastroesophageal reflux disease)   . Gout, unspecified   . Hyperglycemia 05/08/2011  . Hyperlipidemia    borderline- not on meds  . Hypertension   . Insomnia   . Leg edema    chronic  . Obesity, unspecified   . Osteoarthritis   . Renal insufficiency, mild 05/08/2011  . Small bowel obstruction [facility])    Past Surgical History:  Past Surgical History:  Procedure Laterality Date  . ABDOMINAL HYSTERECTOMY  2002   infection- no history of cancer  . APPENDECTOMY  2002  . catacact Right 11/01/2019  . CHOLECYSTECTOMY  1977  . ECTOPIC PREGNANCY SURGERY  1971  . right shoulder rotator cuff repair  06-07-10  . TOTAL HIP ARTHROPLASTY Left 1998      Social History:  reports that she quit smoking about 6  years ago. Her smoking use included cigarettes. She smoked 0.25 packs per day. She has never used smokeless tobacco. She reports current alcohol use. She reports that she does not use drugs.  Family History: family history includes Alcohol abuse in her father; Cancer in her mother; Cirrhosis in her father and sister; Colon cancer (age of onset: 28) in her mother; Diabetes in her mother and sister; Heart attack in her brother; Hypertension in her father; Kidney failure in her sister; Liver disease in her sister; Obesity in her daughter; Other in her sister.   HOME MEDICATIONS: Allergies as of 04/10/2021   No Known Allergies     Medication List       Accurate as of April 10, 2021  8:42 AM. If you have any questions, ask your nurse or doctor.        allopurinol 100 MG tablet Commonly known as: ZYLOPRIM TAKE 2 TABLETS BY MOUTH DAILY   aspirin EC 81 MG tablet Take 81 mg by mouth daily.   Cholecalciferol 25 MCG (1000 UT) capsule Take 1,000 Units by mouth daily.   diclofenac 75 MG EC tablet Commonly known as: VOLTAREN TAKE 1 TABLET BY MOUTH TWICE DAILY   DULoxetine 60 MG capsule Commonly known as: CYMBALTA TAKE 1 CAPSULE BY MOUTH ONCE DAILY   furosemide 40 MG tablet Commonly known as: LASIX TAKE  1 TABLET BY MOUTH ONCE DAILY AS NEEDED   hydrochlorothiazide 25 MG tablet Commonly known as: HYDRODIURIL TAKE 1 TABLET BY MOUTH ONCE DAILY   multivitamin with minerals Tabs tablet Take 1 tablet by mouth daily.   pantoprazole 40 MG tablet Commonly known as: PROTONIX TAKE 1 TABLET BY MOUTH ONCE DAILY   predniSONE 20 MG tablet Commonly known as: DELTASONE TAKE 1 TABLET (20 MG TOTAL) BY MOUTH DAILY WITH BREAKFAST FOR 5 DAYS.   SM Tussin DM 10-100 MG/5ML liquid Generic drug: Dextromethorphan-guaiFENesin TAKE 5 MLS BY MOUTH EVERY 6 (SIX) HOURS AS NEEDED FOR COUGH.         REVIEW OF SYSTEMS: A comprehensive ROS was conducted with the patient and is negative except as per HPI     OBJECTIVE:  VS: BP 134/82   Pulse 90   Ht 5\' 4"  (1.626 m)   Wt 269 lb 8 oz (122.2 kg)   SpO2 94%   BMI 46.26 kg/m    Wt Readings from Last 3 Encounters:  04/10/21 269 lb 8 oz (122.2 kg)  03/09/21 268 lb (121.6 kg)  12/05/20 257 lb (116.6 kg)     EXAM: General: Pt appears well and is in NAD  Neck: General: Supple without adenopathy. Thyroid: Thyroid size normal.  No goiter or nodules appreciated.   Lungs: Clear with good BS bilat with no rales, rhonchi, or wheezes  Heart: Auscultation: RRR.  Abdomen: Normoactive bowel sounds, soft, nontender, without masses or organomegaly palpable  Extremities:  BL LE: Left LE trace edema.  Skin: Hair: Texture and amount normal with gender appropriate distribution Skin Inspection: No rashes, acanthosis nigricans/skin tags. No lipohypertrophy Skin Palpation: Skin temperature, texture, and thickness normal to palpation  Neuro:  DTRs: 2+ and symmetric in UE without delay in relaxation phase  Mental Status: Judgment, insight: Intact Orientation: Oriented to time, place, and person Mood and affect: No depression, anxiety, or agitation     DATA REVIEWED:   Results for TIEA, MANNINEN (MRN [id number]) as of 04/11/2021 12:57  Ref. Range 03/09/2021 08:11 03/09/2021 14:48 04/10/2021 08:48  TSH Latest Ref Range: 0.35 - 4.50 uIU/mL 9.31 (H)  5.65 (H)  Triiodothyronine,Free,Serum Latest Ref Range: 2.3 - 4.2 pg/mL  7.4 (H) 4.9 (H)  T4,Free(Direct) Latest Ref Range: 0.60 - 1.60 ng/dL  0.75 0.72    ASSESSMENT/PLAN/RECOMMENDATIONS:   1. Subclinical Hypothyroidism:  - I suspect this has to do with COVID infection ( thyroiditis)  - Her F4 remains normal but her TSH and FT3 remain abnormal and are trending down in the right direction, at this time I would recommend monitoring these levels until we reach a steady level.     F/U 4 months     Signed electronically by: Mack Guise, MD  [facility] Endocrinology  [facility]  [address] [place], [place] [address] Phone: [phone] FAX: [phone]   CC: [name], [address] [place] [address] Phone: [phone] Fax: [phone]   Return to Endocrinology clinic as below: Future Appointments  Date Time Provider [place]  06/12/2021 10:00 AM [name], NP [id number]

## 2021-04-10 ENCOUNTER — Other Ambulatory Visit: Payer: Self-pay

## 2021-04-10 ENCOUNTER — Encounter: Payer: Self-pay | Admitting: Internal Medicine

## 2021-04-10 ENCOUNTER — Ambulatory Visit: Payer: PPO | Admitting: Internal Medicine

## 2021-04-10 VITALS — BP 134/82 | HR 90 | Ht 64.0 in | Wt 269.5 lb

## 2021-04-10 DIAGNOSIS — R5383 Other fatigue: Secondary | ICD-10-CM | POA: Diagnosis not present

## 2021-04-10 LAB — T4, FREE: Free T4: 0.72 ng/dL (ref 0.60–1.60)

## 2021-04-10 LAB — T3, FREE: T3, Free: 4.9 pg/mL — ABNORMAL HIGH (ref 2.3–4.2)

## 2021-04-10 LAB — TSH: TSH: 5.65 u[IU]/mL — ABNORMAL HIGH (ref 0.35–4.50)

## 2021-04-11 ENCOUNTER — Encounter: Payer: Self-pay | Admitting: Internal Medicine

## 2021-04-19 ENCOUNTER — Other Ambulatory Visit: Payer: Self-pay | Admitting: Physician Assistant

## 2021-04-19 ENCOUNTER — Encounter: Payer: Self-pay | Admitting: Family

## 2021-04-19 MED FILL — Hydrochlorothiazide Tab 25 MG: ORAL | 90 days supply | Qty: 90 | Fill #0 | Status: AC

## 2021-04-19 MED FILL — Pantoprazole Sodium EC Tab 40 MG (Base Equiv): ORAL | 90 days supply | Qty: 90 | Fill #0 | Status: AC

## 2021-04-19 MED FILL — Allopurinol Tab 100 MG: ORAL | 90 days supply | Qty: 180 | Fill #0 | Status: AC

## 2021-04-20 ENCOUNTER — Other Ambulatory Visit: Payer: Self-pay | Admitting: Family

## 2021-04-20 ENCOUNTER — Other Ambulatory Visit (HOSPITAL_BASED_OUTPATIENT_CLINIC_OR_DEPARTMENT_OTHER): Payer: Self-pay

## 2021-04-20 MED ORDER — SCOPOLAMINE 1 MG/3DAYS TD PT72
1.0000 | MEDICATED_PATCH | TRANSDERMAL | 0 refills | Status: DC
  Filled 2021-04-20: qty 3, 9d supply, fill #0

## 2021-04-23 ENCOUNTER — Other Ambulatory Visit (HOSPITAL_BASED_OUTPATIENT_CLINIC_OR_DEPARTMENT_OTHER): Payer: Self-pay

## 2021-04-24 ENCOUNTER — Other Ambulatory Visit (HOSPITAL_BASED_OUTPATIENT_CLINIC_OR_DEPARTMENT_OTHER): Payer: Self-pay

## 2021-04-24 ENCOUNTER — Other Ambulatory Visit: Payer: Self-pay | Admitting: Physician Assistant

## 2021-04-25 ENCOUNTER — Other Ambulatory Visit (HOSPITAL_BASED_OUTPATIENT_CLINIC_OR_DEPARTMENT_OTHER): Payer: Self-pay

## 2021-04-27 ENCOUNTER — Other Ambulatory Visit (HOSPITAL_BASED_OUTPATIENT_CLINIC_OR_DEPARTMENT_OTHER): Payer: Self-pay

## 2021-05-01 ENCOUNTER — Other Ambulatory Visit (HOSPITAL_BASED_OUTPATIENT_CLINIC_OR_DEPARTMENT_OTHER): Payer: Self-pay

## 2021-05-06 ENCOUNTER — Other Ambulatory Visit: Payer: Self-pay | Admitting: Physician Assistant

## 2021-05-06 MED FILL — Duloxetine HCl Enteric Coated Pellets Cap 60 MG (Base Eq): ORAL | 90 days supply | Qty: 90 | Fill #0 | Status: AC

## 2021-05-07 ENCOUNTER — Other Ambulatory Visit: Payer: Self-pay | Admitting: Physician Assistant

## 2021-05-07 ENCOUNTER — Other Ambulatory Visit (HOSPITAL_BASED_OUTPATIENT_CLINIC_OR_DEPARTMENT_OTHER): Payer: Self-pay

## 2021-05-08 ENCOUNTER — Other Ambulatory Visit (HOSPITAL_BASED_OUTPATIENT_CLINIC_OR_DEPARTMENT_OTHER): Payer: Self-pay

## 2021-05-08 ENCOUNTER — Other Ambulatory Visit: Payer: Self-pay | Admitting: Physician Assistant

## 2021-05-09 ENCOUNTER — Other Ambulatory Visit (HOSPITAL_BASED_OUTPATIENT_CLINIC_OR_DEPARTMENT_OTHER): Payer: Self-pay

## 2021-05-11 ENCOUNTER — Other Ambulatory Visit (HOSPITAL_BASED_OUTPATIENT_CLINIC_OR_DEPARTMENT_OTHER): Payer: Self-pay

## 2021-05-14 ENCOUNTER — Other Ambulatory Visit (HOSPITAL_BASED_OUTPATIENT_CLINIC_OR_DEPARTMENT_OTHER): Payer: Self-pay

## 2021-05-14 ENCOUNTER — Other Ambulatory Visit: Payer: Self-pay | Admitting: Physician Assistant

## 2021-05-15 ENCOUNTER — Other Ambulatory Visit (HOSPITAL_BASED_OUTPATIENT_CLINIC_OR_DEPARTMENT_OTHER): Payer: Self-pay

## 2021-06-12 ENCOUNTER — Encounter: Payer: PPO | Admitting: Family

## 2021-06-26 ENCOUNTER — Ambulatory Visit (INDEPENDENT_AMBULATORY_CARE_PROVIDER_SITE_OTHER): Payer: PPO | Admitting: Family

## 2021-06-26 ENCOUNTER — Other Ambulatory Visit (HOSPITAL_BASED_OUTPATIENT_CLINIC_OR_DEPARTMENT_OTHER): Payer: Self-pay

## 2021-06-26 ENCOUNTER — Other Ambulatory Visit: Payer: Self-pay

## 2021-06-26 ENCOUNTER — Encounter: Payer: Self-pay | Admitting: Family

## 2021-06-26 VITALS — BP 128/80 | HR 81 | Temp 97.1°F | Resp 18 | Ht 64.0 in | Wt 276.0 lb

## 2021-06-26 DIAGNOSIS — R1310 Dysphagia, unspecified: Secondary | ICD-10-CM

## 2021-06-26 DIAGNOSIS — Z0000 Encounter for general adult medical examination without abnormal findings: Secondary | ICD-10-CM

## 2021-06-26 DIAGNOSIS — Z87891 Personal history of nicotine dependence: Secondary | ICD-10-CM | POA: Diagnosis not present

## 2021-06-26 DIAGNOSIS — G894 Chronic pain syndrome: Secondary | ICD-10-CM | POA: Diagnosis not present

## 2021-06-26 DIAGNOSIS — R739 Hyperglycemia, unspecified: Secondary | ICD-10-CM | POA: Diagnosis not present

## 2021-06-26 LAB — BASIC METABOLIC PANEL
BUN: 21 mg/dL (ref 6–23)
CO2: 29 mEq/L (ref 19–32)
Calcium: 9.4 mg/dL (ref 8.4–10.5)
Chloride: 105 mEq/L (ref 96–112)
Creatinine, Ser: 1.08 mg/dL (ref 0.40–1.20)
GFR: 51.95 mL/min — ABNORMAL LOW (ref >60.00)
Glucose, Bld: 111 mg/dL — ABNORMAL HIGH (ref 70–99)
Potassium: 4.4 mEq/L (ref 3.5–5.1)
Sodium: 141 mEq/L (ref 135–145)

## 2021-06-26 LAB — HEMOGLOBIN A1C: Hgb A1c MFr Bld: 6.5 % (ref 4.6–6.5)

## 2021-06-26 MED ORDER — SHINGRIX 50 MCG/0.5ML IM SUSR
0.5000 mL | Freq: Once | INTRAMUSCULAR | 1 refills | Status: AC
  Filled 2021-06-26: qty 0.5, 1d supply, fill #0

## 2021-06-26 MED ORDER — TRAMADOL HCL 50 MG PO TABS
50.0000 mg | ORAL_TABLET | Freq: Three times a day (TID) | ORAL | 0 refills | Status: DC | PRN
  Filled 2021-06-26: qty 21, 7d supply, fill #0
  Filled 2021-07-23: qty 9, 3d supply, fill #1

## 2021-06-26 NOTE — Assessment & Plan Note (Signed)
Quit smoking when she had covid. I commended pt on this.

## 2021-06-26 NOTE — Assessment & Plan Note (Signed)
New. Secondary to osteoarthritis.  She is using ibuprofen which I advised her to discontinue. Will rx with tramadol 50mg  bid prn in addition to tylenol prn. Reviewed controlled substance registry.  UDS will be ordered and controlled substance contract is updated.

## 2021-06-26 NOTE — Assessment & Plan Note (Addendum)
Discussed healthy diet, exercise and weight loss.  Refer for mammo. Recommended that she obtain her second covid booster.

## 2021-06-26 NOTE — Progress Notes (Addendum)
Subjective:     Patient ID: Jordan [name], female    DOB: [birth year], 70 y.o.   MRN: [id number]  Chief Complaint  Patient presents with   Annual Exam    HPI Patient is in today for CPX.  Patient presents today for complete physical.  Immunizations: due for shingrix, tetanus, flu up to date.  She has not had a covid booster.  Diet: She reports that she is not "dieting".  Appetite is up and down.   Wt Readings from Last 3 Encounters:  06/26/21 276 lb (125.2 kg)  04/10/21 269 lb 8 oz (122.2 kg)  03/09/21 268 lb (121.6 kg)  Exercise:  some exercise in the pool.   Colonoscopy: due Dexa: 2019- normal. Pap Smear: N/A Mammogram: Lab Results  Component Value Date   HGBA1C 6.5 06/26/2021   She also complains of dysphagia- notes that it feels like food gets stuck at the top of her throat sometimes.    Health Maintenance Due  Topic Date Due   OPHTHALMOLOGY EXAM  08/23/2016   COLONOSCOPY (Pts 45-98yrs Insurance coverage will need to be confirmed)  01/14/2019   COVID-19 Vaccine (3 - Pfizer risk series) 09/13/2020    Past Medical History:  Diagnosis Date   Cellulitis and abscess of finger, unspecified 401.9   Cellulitis and abscess of foot, except toes    Depressive disorder, not elsewhere classified    GERD (gastroesophageal reflux disease)    Gout, unspecified    History of tobacco abuse 12/01/2018   Hyperglycemia 05/08/2011   Hyperlipidemia    borderline- not on meds   Hypertension    Insomnia    Leg edema    chronic   Obesity, unspecified    Osteoarthritis    Renal insufficiency, mild 05/08/2011   Small bowel obstruction (HCC)     Past Surgical History:  Procedure Laterality Date   ABDOMINAL HYSTERECTOMY  2002   infection- no history of cancer   APPENDECTOMY  2002   catacact Right 11/01/2019   CHOLECYSTECTOMY  1977   ECTOPIC PREGNANCY SURGERY  1971   right shoulder rotator cuff repair  06-07-10   TOTAL HIP ARTHROPLASTY Left 1998    Family History   Problem Relation Age of Onset   Diabetes Mother    Cancer Mother        colon/ pancreatic   Colon cancer Mother 58   Hypertension Father    Alcohol abuse Father    Cirrhosis Father    Other Sister        Pyeoderma gangrenosis   Obesity Daughter    Diabetes Sister        type 2   Kidney failure Sister    Liver disease Sister    Heart attack Brother    Cirrhosis Sister        had NASH, died heart failure    Social History   Socioeconomic History   Marital status: Single    Spouse name: Not on file   Number of children: 2   Years of education: Not on file   Highest education level: Not on file  Occupational History   Occupation: works prn at [employer]    Employer: [employer]  Tobacco Use   Smoking status: Former    Packs/day: 0.25    Pack years: 0.00    Types: Cigarettes    Quit date: 12/26/2014    Years since quitting: 6.5   Smokeless tobacco: Never  Vaping Use   Vaping Use: Never used  Substance and Sexual Activity   Alcohol use: Yes    Alcohol/week: 0.0 standard drinks    Comment: rare   Drug use: No   Sexual activity: Not Currently  Other Topics Concern   Not on file  Social History Narrative   2 children (daughter and son) both local. 3 grandchildren, one great grandchild   Works at ED front desk   Divorced (married x 30 years)   Social Determinants of Radio broadcast assistant Strain: Not on file  Food Insecurity: Not on file  Transportation Needs: Not on file  Physical Activity: Not on file  Stress: Not on file  Social Connections: Not on file  Intimate Partner Violence: Not on file    Outpatient Medications Prior to Visit  Medication Sig Dispense Refill   allopurinol (ZYLOPRIM) 100 MG tablet TAKE 2 TABLETS BY MOUTH DAILY 180 tablet 1   aspirin EC 81 MG tablet Take 81 mg by mouth daily.     Cholecalciferol 25 MCG (1000 UT) capsule Take 1,000 Units by mouth daily.     DULoxetine (CYMBALTA) 60 MG capsule TAKE 1 CAPSULE BY MOUTH ONCE DAILY 90 capsule  1   furosemide (LASIX) 40 MG tablet TAKE 1 TABLET BY MOUTH ONCE DAILY AS NEEDED 90 tablet 1   hydrochlorothiazide (HYDRODIURIL) 25 MG tablet TAKE 1 TABLET BY MOUTH ONCE DAILY 90 tablet 1   Multiple Vitamin (MULTIVITAMIN WITH MINERALS) TABS tablet Take 1 tablet by mouth daily.     pantoprazole (PROTONIX) 40 MG tablet TAKE 1 TABLET BY MOUTH ONCE DAILY 90 tablet 1   scopolamine (TRANSDERM-SCOP) 1 MG/3DAYS Place 1 patch (1.5 mg total) onto the skin every 3 (three) days. 3 patch 0   diclofenac (VOLTAREN) 75 MG EC tablet TAKE 1 TABLET BY MOUTH TWICE DAILY 30 tablet 2   Dextromethorphan-guaiFENesin 10-100 MG/5ML liquid TAKE 5 MLS BY MOUTH EVERY 6 (SIX) HOURS AS NEEDED FOR COUGH. (Patient not taking: Reported on 04/10/2021) 118 mL 0   predniSONE (DELTASONE) 20 MG tablet TAKE 1 TABLET (20 MG TOTAL) BY MOUTH DAILY WITH BREAKFAST FOR 5 DAYS. (Patient not taking: Reported on 04/10/2021) 5 tablet 0   No facility-administered medications prior to visit.    No Known Allergies  Review of Systems  Constitutional:  Negative for weight loss.  HENT:  Negative for congestion.   Respiratory:  Negative for cough.   Gastrointestinal:  Positive for diarrhea (chronic loose bowels).  Genitourinary:  Negative for dysuria and frequency.  Musculoskeletal:  Positive for joint pain and myalgias.  Skin:  Negative for rash.  Neurological:  Negative for headaches.  Psychiatric/Behavioral:  Negative for depression.       Objective:    Physical Exam  BP 128/80 (BP Location: Left Arm, Patient Position: Sitting, Cuff Size: Large)   Pulse 81   Temp (!) 97.1 F (36.2 C)   Resp 18   Ht 5\' 4"  (1.626 m)   Wt 276 lb (125.2 kg)   SpO2 98%   BMI 47.38 kg/m  Wt Readings from Last 3 Encounters:  06/26/21 276 lb (125.2 kg)  04/10/21 269 lb 8 oz (122.2 kg)  03/09/21 268 lb (121.6 kg)   Physical Exam  Constitutional: She is oriented to person, place, and time. She appears well-developed and well-nourished. No distress.   HENT:  Head: Normocephalic and atraumatic.  Right Ear: Tympanic membrane and ear canal normal.  Left Ear: Tympanic membrane and ear canal normal.  Mouth/Throat: Not examined- pt wearing mask Eyes: Pupils are  equal, round, and reactive to light. No scleral icterus.  Neck: Normal range of motion. No thyromegaly present.  Cardiovascular: Normal rate and regular rhythm.   No murmur heard. Pulmonary/Chest: Effort normal and breath sounds normal. No respiratory distress. He has no wheezes. She has no rales. She exhibits no tenderness.  Abdominal: Soft. Bowel sounds are normal. She exhibits no distension and no mass. There is no tenderness. There is no rebound and no guarding.  Musculoskeletal: She exhibits no edema.  Lymphadenopathy:    She has no cervical adenopathy.  Neurological: She is alert and oriented to person, place, and time. She has normal patellar reflexes. She exhibits normal muscle tone. Coordination normal.  Skin: Skin is warm and dry.  Psychiatric: She has a normal mood and affect. Her behavior is normal. Judgment and thought content normal.  Breast/pelvic: deferred           Assessment & Plan:       Assessment & Plan:   Problem List Items Addressed This Visit       Unprioritized   Preventative health care - Primary    Discussed healthy diet, exercise and weight loss.  Refer for mammo. Recommended that she obtain her second covid booster.        Relevant Orders   MM 3D SCREEN BREAST BILATERAL   Ambulatory referral to Gastroenterology   Hyperglycemia   Relevant Orders   Hemoglobin A1c (Completed)   Basic metabolic panel (Completed)   History of tobacco abuse    Quit smoking when she had covid. I commended pt on this.        Chronic pain syndrome    New. Secondary to osteoarthritis.  She is using ibuprofen which I advised her to discontinue. Will rx with tramadol 50mg  bid prn in addition to tylenol prn. Reviewed controlled substance registry.  UDS will  be ordered and controlled substance contract is updated.        Relevant Medications   traMADol (ULTRAM) 50 MG tablet   Other Relevant Orders   DRUG MONITORING, PANEL 8 WITH CONFIRMATION, URINE   Other Visit Diagnoses     Dysphagia, unspecified type       Relevant Orders   Ambulatory referral to Gastroenterology       I have discontinued Diego Cory. Carnathan's diclofenac, predniSONE, and Dextromethorphan-guaiFENesin. I am also having her start on Shingrix and traMADol. Additionally, I am having her maintain her aspirin EC, multivitamin with minerals, Cholecalciferol, pantoprazole, DULoxetine, hydrochlorothiazide, furosemide, allopurinol, and scopolamine.  Meds ordered this encounter  Medications   Zoster Vaccine Adjuvanted [facility]) injection    Sig: Inject 0.5 mLs into the muscle once for 1 dose. Repeat in 2-6 months    Dispense:  0.5 mL    Refill:  1    Order Specific Question:   Supervising Provider    Answer:   Penni Homans A [[id number]]   traMADol (ULTRAM) 50 MG tablet    Sig: Take 1 tablet (50 mg total) by mouth every 8 (eight) hours as needed.    Dispense:  30 tablet    Refill:  0    Order Specific Question:   Supervising Provider    Answer:   Penni Homans A [[id number]]

## 2021-06-26 NOTE — Addendum Note (Signed)
Addended by: [name] on: 06/26/2021 01:23 PM   Modules accepted: Orders

## 2021-06-26 NOTE — Patient Instructions (Addendum)
Please get your covid booster and your shingles shot at the pharmacy downstairs.   Please schedule routine eye exam.   Stop Voltaren and ibuprofen.  You may use tramadol and tylenol as needed.   Complete lab work prior to leaving.

## 2021-06-29 LAB — DRUG MONITORING, PANEL 8 WITH CONFIRMATION, URINE
6 Acetylmorphine: NEGATIVE ng/mL (ref <10)
Alcohol Metabolites: NEGATIVE ng/mL (ref <500)
Amphetamines: NEGATIVE ng/mL (ref <500)
Benzodiazepines: NEGATIVE ng/mL (ref <100)
Buprenorphine, Urine: NEGATIVE ng/mL (ref <5)
Cocaine Metabolite: NEGATIVE ng/mL (ref <150)
Creatinine: 119.9 mg/dL (ref >=20.0)
MDMA: NEGATIVE ng/mL (ref <500)
Marijuana Metabolite: NEGATIVE ng/mL (ref <20)
Opiates: NEGATIVE ng/mL (ref <100)
Oxidant: NEGATIVE ug/mL (ref <200)
Oxycodone: NEGATIVE ng/mL (ref <100)
pH: 5.6 (ref 4.5–9.0)

## 2021-06-29 LAB — DM TEMPLATE

## 2021-07-13 NOTE — Progress Notes (Signed)
Subjective:   Jordan [name] is a 70 y.o. female who presents for Medicare Annual (Subsequent) preventive examination.  I connected with [name] today by telephone and verified that I am speaking with the correct person using two identifiers. Location patient: home Location provider: work Persons participating in the virtual visit: patient, Marine scientist.    I discussed the limitations, risks, security and privacy concerns of performing an evaluation and management service by telephone and the availability of in person appointments. I also discussed with the patient that there may be a patient responsible charge related to this service. The patient expressed understanding and verbally consented to this telephonic visit.    Interactive audio and video telecommunications were attempted between this provider and patient, however failed, due to patient having technical difficulties OR patient did not have access to video capability.  We continued and completed visit with audio only.  Some vital signs may be absent or patient reported.   Time Spent with patient on telephone encounter: 20 minutes   Review of Systems     Cardiac Risk Factors include: advanced age (>69mn, >>62women);hypertension;dyslipidemia;obesity (BMI >30kg/m2)     Objective:    Today's Vitals   07/16/21 0822  Weight: 276 lb (125.2 kg)  Height: '5\' 4"'$  (1.626 m)   Body mass index is 47.38 kg/m.  Advanced Directives 07/16/2021 10/08/2020 10/08/2020 10/08/2020 10/01/2020 10/21/2019 12/01/2018  Does Patient Have a Medical Advance Directive? Yes - Yes Yes Yes Yes No  Type of Advance Directive [place] Living will - - - [place] will -  Does patient want to make changes to medical advance directive? - No - Patient declined - - - No - Patient declined -  Copy of [place] Chart? Yes - validated most recent copy scanned in chart (See row information) -  - - - No - copy requested -  Would patient like information on creating a medical advance directive? - No - Patient declined - - - - No - Patient declined    Current Medications (verified) Outpatient Encounter Medications as of 07/16/2021  Medication Sig   allopurinol (ZYLOPRIM) 100 MG tablet TAKE 2 TABLETS BY MOUTH DAILY   aspirin EC 81 MG tablet Take 81 mg by mouth daily.   Cholecalciferol 25 MCG (1000 UT) capsule Take 1,000 Units by mouth daily.   DULoxetine (CYMBALTA) 60 MG capsule TAKE 1 CAPSULE BY MOUTH ONCE DAILY   furosemide (LASIX) 40 MG tablet TAKE 1 TABLET BY MOUTH ONCE DAILY AS NEEDED   hydrochlorothiazide (HYDRODIURIL) 25 MG tablet TAKE 1 TABLET BY MOUTH ONCE DAILY   Multiple Vitamin (MULTIVITAMIN WITH MINERALS) TABS tablet Take 1 tablet by mouth daily.   pantoprazole (PROTONIX) 40 MG tablet TAKE 1 TABLET BY MOUTH ONCE DAILY   scopolamine (TRANSDERM-SCOP) 1 MG/3DAYS Place 1 patch (1.5 mg total) onto the skin every 3 (three) days.   traMADol (ULTRAM) 50 MG tablet Take 1 tablet (50 mg total) by mouth every 8 (eight) hours as needed.   [DISCONTINUED] famotidine (PEPCID AC) 10 MG chewable tablet Chew 10 mg by mouth daily.   No facility-administered encounter medications on file as of 07/16/2021.    Allergies (verified) Patient has no known allergies.   History: Past Medical History:  Diagnosis Date   Cellulitis and abscess of finger, unspecified 401.9   Cellulitis and abscess of foot, except toes    Depressive disorder, not elsewhere classified    GERD (gastroesophageal reflux disease)  Gout, unspecified    History of tobacco abuse 12/01/2018   Hyperglycemia 05/08/2011   Hyperlipidemia    borderline- not on meds   Hypertension    Insomnia    Leg edema    chronic   Obesity, unspecified    Osteoarthritis    Renal insufficiency, mild 05/08/2011   Small bowel obstruction (HCC)    Past Surgical History:  Procedure Laterality Date   ABDOMINAL HYSTERECTOMY  2002    infection- no history of cancer   APPENDECTOMY  2002   catacact Right 11/01/2019   CHOLECYSTECTOMY  1977   ECTOPIC PREGNANCY SURGERY  1971   right shoulder rotator cuff repair  06-07-10   TOTAL HIP ARTHROPLASTY Left 1998   Family History  Problem Relation Age of Onset   Diabetes Mother    Cancer Mother        colon/ pancreatic   Colon cancer Mother 27   Hypertension Father    Alcohol abuse Father    Cirrhosis Father    Other Sister        Pyeoderma gangrenosis   Obesity Daughter    Diabetes Sister        type 2   Kidney failure Sister    Liver disease Sister    Heart attack Brother    Cirrhosis Sister        had NASH, died heart failure   Social History   Socioeconomic History   Marital status: Single    Spouse name: Not on file   Number of children: 2   Years of education: Not on file   Highest education level: Not on file  Occupational History   Occupation: works prn at [employer]    Employer: [employer]  Tobacco Use   Smoking status: Former    Packs/day: 0.25    Types: Cigarettes    Quit date: 12/26/2014    Years since quitting: 6.5   Smokeless tobacco: Never  Vaping Use   Vaping Use: Never used  Substance and Sexual Activity   Alcohol use: Yes    Alcohol/week: 0.0 standard drinks    Comment: rare   Drug use: No   Sexual activity: Not Currently  Other Topics Concern   Not on file  Social History Narrative   2 children (daughter and son) both local. 3 grandchildren, one great grandchild   Works at ED front desk   Divorced (married x 30 years)   Social Determinants of Radio broadcast assistant Strain: Low Risk    Difficulty of Paying Living Expenses: Not hard at all  Food Insecurity: No Food Insecurity   Worried About Charity fundraiser in the Last Year: Never true   Arboriculturist in the Last Year: Never true  Transportation Needs: No Transportation Needs   Lack of Transportation (Medical): No   Lack of Transportation (Non-Medical): No  Physical  Activity: Insufficiently Active   Days of Exercise per Week: 3 days   Minutes of Exercise per Session: 30 min  Stress: No Stress Concern Present   Feeling of Stress : Not at all  Social Connections: Moderately Isolated   Frequency of Communication with Friends and Family: More than three times a week   Frequency of Social Gatherings with Friends and Family: More than three times a week   Attends Religious Services: More than 4 times per year   Active Member of [employer] or Organizations: No   Attends Archivist Meetings: Never   Marital Status:  Divorced    Tobacco Counseling Counseling given: Not Answered   Clinical Intake:  Pre-visit preparation completed: Yes  Pain : No/denies pain     Nutritional Status: BMI > 30  Obese Nutritional Risks: None Diabetes: No  How often do you need to have someone help you when you read instructions, pamphlets, or other written materials from your doctor or pharmacy?: 1 - Never  Diabetic?No  Interpreter Needed?: No  Information entered by :: Caroleen Hamman LPN   Activities of Daily Living In your present state of health, do you have any difficulty performing the following activities: 07/16/2021 10/08/2020  Hearing? N N  Vision? N N  Difficulty concentrating or making decisions? N N  Walking or climbing stairs? N N  Dressing or bathing? N N  Doing errands, shopping? N N  Preparing Food and eating ? N -  Using the Toilet? N -  In the past six months, have you accidently leaked urine? N -  Do you have problems with loss of bowel control? N -  Managing your Medications? N -  Managing your Finances? N -  Housekeeping or managing your Housekeeping? N -  Some recent data might be hidden    Patient Care Team: [name], NP as PCP - General Day, Melvenia Beam, [facility] (Inactive) as Pharmacist (Pharmacist)  Indicate any recent Medical Services you may have received from other than [facility] providers in the past year (date may be  approximate).     Assessment:   This is a routine wellness examination for Jordan [name].  Hearing/Vision screen Hearing Screening - Comments:: No issues Vision Screening - Comments:: Last eye exam-2021-[facility] [place] [facility]  Dietary issues and exercise activities discussed: Current Exercise Habits: Home exercise routine, Type of exercise: walking, Time (Minutes): 30, Frequency (Times/Week): 3, Weekly Exercise (Minutes/Week): 90, Intensity: Mild, Exercise limited by: orthopedic condition(s) (back pain)   Goals Addressed             This Visit's Progress    COMPLETED: Continue thinking about stopping smoking       Increase physical activity   On track      Depression Screen PHQ 2/9 Scores 07/16/2021 06/26/2021 08/31/2020 10/21/2019 12/11/2018 10/16/2018 09/07/2018  PHQ - 2 Score 0 0 1 0 0 0 0  PHQ- 9 Score - - 2 - 0 - 0  Exception Documentation - - - - - - -    Fall Risk Fall Risk  07/16/2021 06/26/2021 07/18/2020 10/21/2019 10/16/2018  Falls in the past year? '1 1 1 '$ 0 No  Comment - - Emmi Telephone Survey: data to providers prior to load - -  Number falls in past yr: '1 1 1 '$ 0 -  Comment - - Emmi Telephone Survey Actual Response = 2 - -  Injury with Fall? 0 0 0 0 -  Risk for fall due to : - - - - -  Follow up Falls prevention discussed - - - -    FALL RISK PREVENTION PERTAINING TO THE HOME:  Any stairs in or around the home? Yes  If so, are there any without handrails? No  Home free of loose throw rugs in walkways, pet beds, electrical cords, etc? Yes  Adequate lighting in your home to reduce risk of falls? Yes   ASSISTIVE DEVICES UTILIZED TO PREVENT FALLS:  Life alert? No  Use of a cane, walker or w/c? Yes occasionally Grab bars in the bathroom? No  Shower chair or bench in shower? Yes  Elevated  toilet seat or a handicapped toilet? No   TIMED UP AND GO:  Was the test performed? No . Phone visit   Cognitive Function:Normal cognitive status assessed by   this Nurse Health Advisor. No abnormalities found.   MMSE - Mini Mental State Exam 01/07/2017  Orientation to time 5  Orientation to Place 5  Registration 3  Attention/ Calculation 5  Recall 2  Language- name 2 objects 2  Language- repeat 1  Language- follow 3 step command 3  Language- read & follow direction 1  Write a sentence 1  Copy design 1  Total score 29        Immunizations Immunization History  Administered Date(s) Administered   Fluad Quad(high Dose 65+) 10/07/2019, 10/27/2020   Influenza Whole 10/12/2009   Influenza, High Dose Seasonal PF 09/02/2017, 09/07/2018   Influenza-Unspecified 09/21/2013, 08/23/2014, 09/22/2016   PFIZER(Purple Top)SARS-COV-2 Vaccination 07/26/2020, 08/16/2020   Pneumococcal Conjugate-13 09/04/2016   Pneumococcal Polysaccharide-23 12/23/2013, 10/07/2019   Td 12/23/2004   Tdap 08/11/2015   Zoster Recombinat (Shingrix) 06/26/2021   Zoster, Live 06/19/2011    TDAP status: Up to date  Flu Vaccine status: Up to date  Pneumococcal vaccine status: Up to date  Covid-19 vaccine status: Information provided on how to obtain vaccines. Booster due  Qualifies for Shingles Vaccine? No   Zostavax completed Yes   Shingrix Completed?: Yes  Screening Tests Health Maintenance  Topic Date Due   OPHTHALMOLOGY EXAM  08/23/2016   COLONOSCOPY (Pts 45-84yr Insurance coverage will need to be confirmed)  01/14/2019   COVID-19 Vaccine (3 - Pfizer risk series) 09/13/2020   INFLUENZA VACCINE  07/23/2021   Zoster Vaccines- Shingrix (2 of 2) 08/21/2021   MAMMOGRAM  10/21/2021   HEMOGLOBIN A1C  12/27/2021   TETANUS/TDAP  08/10/2025   DEXA SCAN  Completed   Hepatitis C Screening  Completed   PNA vac Low Risk Adult  Completed   HPV VACCINES  Aged Out   FOOT EXAM  Discontinued    Health Maintenance  Health Maintenance Due  Topic Date Due   OPHTHALMOLOGY EXAM  08/23/2016   COLONOSCOPY (Pts 45-470yrInsurance coverage will need to be confirmed)   01/14/2019   COVID-19 Vaccine (3 - Pfizer risk series) 09/13/2020    Colorectal cancer screening: Referral to GI placed by PCP. Pt aware the office will call re: appt.  Mammogram status: Scheduled for 07/30/2021  Bone Density status: Due-Patient declined today  Lung Cancer Screening: (Low Dose CT Chest recommended if Age 71-80ears, 30 pack-year currently smoking OR have quit w/in 15years.) does not qualify.     Additional Screening:  Hepatitis C Screening: Completed 10/09/2020  Vision Screening: Recommended annual ophthalmology exams for early detection of glaucoma and other disorders of the eye. Is the patient up to date with their annual eye exam?  Yes  Who is the provider or what is the name of the office in which the patient attends annual eye exams? [facility]: Recommended annual dental exams for proper oral hygiene  Community Resource Referral / Chronic Care Management: CRR required this visit?  No   CCM required this visit?  No      Plan:     I have personally reviewed and noted the following in the patient's chart:   Medical and social history Use of alcohol, tobacco or illicit drugs  Current medications and supplements including opioid prescriptions.  Functional ability and status Nutritional status Physical activity Advanced directives List of  other physicians Hospitalizations, surgeries, and ER visits in previous 12 months Vitals Screenings to include cognitive, depression, and falls Referrals and appointments  In addition, I have reviewed and discussed with patient certain preventive protocols, quality metrics, and best practice recommendations. A written personalized care plan for preventive services as well as general preventive health recommendations were provided to patient.   Due to this being a telephonic visit, the after visit summary with patients personalized plan was offered to patient via mail or my-chart.  Patient would like to access on my-chart   Marta Antu, [place]   [id number]  Nurse Health Advisor  Nurse Notes: None

## 2021-07-16 ENCOUNTER — Ambulatory Visit (INDEPENDENT_AMBULATORY_CARE_PROVIDER_SITE_OTHER): Payer: PPO

## 2021-07-16 VITALS — Ht 64.0 in | Wt 276.0 lb

## 2021-07-16 DIAGNOSIS — Z0000 Encounter for general adult medical examination without abnormal findings: Secondary | ICD-10-CM

## 2021-07-16 NOTE — Patient Instructions (Signed)
Jordan [name] , Thank you for taking time to complete your Medicare Wellness Visit. I appreciate your ongoing commitment to your health goals. Please review the following plan we discussed and let me know if I can assist you in the future.   Screening recommendations/referrals: Colonoscopy: Ordered by PCP.  Mammogram: Scheduled for 07/30/2021 Bone Density:Due Recommended yearly ophthalmology/optometry visit for glaucoma screening and checkup Recommended yearly dental visit for hygiene and checkup  Vaccinations: Influenza vaccine: Up to date Pneumococcal vaccine: Up to date Tdap vaccine: Up to date-Due 08/10/2025 Shingles vaccine: Completed first dose of Shingrix   Covid-19:Booster due-May obtain vaccine at your local pharmcy  Advanced directives: Copy in chart  Conditions/risks identified: See problem list  Next appointment: Follow up in one year for your annual wellness visit 07/18/2022 @ 8:20   Preventive Care 16 Years and Older, Female Preventive care refers to lifestyle choices and visits with your health care provider that can promote health and wellness. What does preventive care include? A yearly physical exam. This is also called an annual well check. Dental exams once or twice a year. Routine eye exams. Ask your health care provider how often you should have your eyes checked. Personal lifestyle choices, including: Daily care of your teeth and gums. Regular physical activity. Eating a healthy diet. Avoiding tobacco and drug use. Limiting alcohol use. Practicing safe sex. Taking low-dose aspirin every day. Taking vitamin and mineral supplements as recommended by your health care provider. What happens during an annual well check? The services and screenings done by your health care provider during your annual well check will depend on your age, overall health, lifestyle risk factors, and family history of disease. Counseling  Your health care provider may ask you questions  about your: Alcohol use. Tobacco use. Drug use. Emotional well-being. Home and relationship well-being. Sexual activity. Eating habits. History of falls. Memory and ability to understand (cognition). Work and work Statistician. Reproductive health. Screening  You may have the following tests or measurements: Height, weight, and BMI. Blood pressure. Lipid and cholesterol levels. These may be checked every 5 years, or more frequently if you are over 83 years old. Skin check. Lung cancer screening. You may have this screening every year starting at age 41 if you have a 30-pack-year history of smoking and currently smoke or have quit within the past 15 years. Fecal occult blood test (FOBT) of the stool. You may have this test every year starting at age 41. Flexible sigmoidoscopy or colonoscopy. You may have a sigmoidoscopy every 5 years or a colonoscopy every 10 years starting at age 64. Hepatitis C blood test. Hepatitis B blood test. Sexually transmitted disease (STD) testing. Diabetes screening. This is done by checking your blood sugar (glucose) after you have not eaten for a while (fasting). You may have this done every 1-3 years. Bone density scan. This is done to screen for osteoporosis. You may have this done starting at age 81. Mammogram. This may be done every 1-2 years. Talk to your health care provider about how often you should have regular mammograms. Talk with your health care provider about your test results, treatment options, and if necessary, the need for more tests. Vaccines  Your health care provider may recommend certain vaccines, such as: Influenza vaccine. This is recommended every year. Tetanus, diphtheria, and acellular pertussis (Tdap, Td) vaccine. You may need a Td booster every 10 years. Zoster vaccine. You may need this after age 48. Pneumococcal 13-valent conjugate (PCV13) vaccine. One dose is  recommended after age 52. Pneumococcal polysaccharide (PPSV23)  vaccine. One dose is recommended after age 38. Talk to your health care provider about which screenings and vaccines you need and how often you need them. This information is not intended to replace advice given to you by your health care provider. Make sure you discuss any questions you have with your health care provider. Document Released: 01/05/2016 Document Revised: 08/28/2016 Document Reviewed: 10/10/2015 Elsevier Interactive Patient Education  2017 [place] Prevention in the Home Falls can cause injuries. They can happen to people of all ages. There are many things you can do to make your home safe and to help prevent falls. What can I do on the outside of my home? Regularly fix the edges of walkways and driveways and fix any cracks. Remove anything that might make you trip as you walk through a door, such as a raised step or threshold. Trim any bushes or trees on the path to your home. Use bright outdoor lighting. Clear any walking paths of anything that might make someone trip, such as rocks or tools. Regularly check to see if handrails are loose or broken. Make sure that both sides of any steps have handrails. Any raised decks and porches should have guardrails on the edges. Have any leaves, snow, or ice cleared regularly. Use sand or salt on walking paths during winter. Clean up any spills in your garage right away. This includes oil or grease spills. What can I do in the bathroom? Use night lights. Install grab bars by the toilet and in the tub and shower. Do not use towel bars as grab bars. Use non-skid mats or decals in the tub or shower. If you need to sit down in the shower, use a plastic, non-slip stool. Keep the floor dry. Clean up any water that spills on the floor as soon as it happens. Remove soap buildup in the tub or shower regularly. Attach bath mats securely with double-sided non-slip rug tape. Do not have throw rugs and other things on the floor that  can make you trip. What can I do in the bedroom? Use night lights. Make sure that you have a light by your bed that is easy to reach. Do not use any sheets or blankets that are too big for your bed. They should not hang down onto the floor. Have a firm chair that has side arms. You can use this for support while you get dressed. Do not have throw rugs and other things on the floor that can make you trip. What can I do in the kitchen? Clean up any spills right away. Avoid walking on wet floors. Keep items that you use a lot in easy-to-reach places. If you need to reach something above you, use a strong step stool that has a grab bar. Keep electrical cords out of the way. Do not use floor polish or wax that makes floors slippery. If you must use wax, use non-skid floor wax. Do not have throw rugs and other things on the floor that can make you trip. What can I do with my stairs? Do not leave any items on the stairs. Make sure that there are handrails on both sides of the stairs and use them. Fix handrails that are broken or loose. Make sure that handrails are as long as the stairways. Check any carpeting to make sure that it is firmly attached to the stairs. Fix any carpet that is loose or worn. Avoid having  throw rugs at the top or bottom of the stairs. If you do have throw rugs, attach them to the floor with carpet tape. Make sure that you have a light switch at the top of the stairs and the bottom of the stairs. If you do not have them, ask someone to add them for you. What else can I do to help prevent falls? Wear shoes that: Do not have high heels. Have rubber bottoms. Are comfortable and fit you well. Are closed at the toe. Do not wear sandals. If you use a stepladder: Make sure that it is fully opened. Do not climb a closed stepladder. Make sure that both sides of the stepladder are locked into place. Ask someone to hold it for you, if possible. Clearly mark and make sure that you  can see: Any grab bars or handrails. First and last steps. Where the edge of each step is. Use tools that help you move around (mobility aids) if they are needed. These include: Canes. Walkers. Scooters. Crutches. Turn on the lights when you go into a dark area. Replace any light bulbs as soon as they burn out. Set up your furniture so you have a clear path. Avoid moving your furniture around. If any of your floors are uneven, fix them. If there are any pets around you, be aware of where they are. Review your medicines with your doctor. Some medicines can make you feel dizzy. This can increase your chance of falling. Ask your doctor what other things that you can do to help prevent falls. This information is not intended to replace advice given to you by your health care provider. Make sure you discuss any questions you have with your health care provider. Document Released: 10/05/2009 Document Revised: 05/16/2016 Document Reviewed: 01/13/2015 Elsevier Interactive Patient Education  2017 [employer].

## 2021-07-23 ENCOUNTER — Other Ambulatory Visit (HOSPITAL_BASED_OUTPATIENT_CLINIC_OR_DEPARTMENT_OTHER): Payer: Self-pay

## 2021-07-23 MED FILL — Pantoprazole Sodium EC Tab 40 MG (Base Equiv): ORAL | 90 days supply | Qty: 90 | Fill #1 | Status: AC

## 2021-07-23 MED FILL — Hydrochlorothiazide Tab 25 MG: ORAL | 90 days supply | Qty: 90 | Fill #1 | Status: AC

## 2021-07-30 ENCOUNTER — Other Ambulatory Visit: Payer: Self-pay

## 2021-07-30 ENCOUNTER — Ambulatory Visit (HOSPITAL_BASED_OUTPATIENT_CLINIC_OR_DEPARTMENT_OTHER)
Admission: RE | Admit: 2021-07-30 | Discharge: 2021-07-30 | Disposition: A | Discharge status: Home / Self Care | Payer: PPO | Source: Ambulatory Visit | Attending: Family | Admitting: Family

## 2021-07-30 ENCOUNTER — Encounter (HOSPITAL_BASED_OUTPATIENT_CLINIC_OR_DEPARTMENT_OTHER): Payer: Self-pay

## 2021-07-30 ENCOUNTER — Encounter: Payer: Self-pay | Admitting: Gastroenterology

## 2021-07-30 DIAGNOSIS — Z0000 Encounter for general adult medical examination without abnormal findings: Secondary | ICD-10-CM

## 2021-07-30 DIAGNOSIS — Z1231 Encounter for screening mammogram for malignant neoplasm of breast: Secondary | ICD-10-CM | POA: Insufficient documentation

## 2021-08-01 ENCOUNTER — Encounter: Payer: Self-pay | Admitting: Gastroenterology

## 2021-08-07 ENCOUNTER — Ambulatory Visit: Payer: PPO | Admitting: Family

## 2021-08-08 ENCOUNTER — Telehealth (INDEPENDENT_AMBULATORY_CARE_PROVIDER_SITE_OTHER): Payer: PPO | Admitting: Family

## 2021-08-08 ENCOUNTER — Other Ambulatory Visit: Payer: Self-pay

## 2021-08-08 DIAGNOSIS — M1990 Unspecified osteoarthritis, unspecified site: Secondary | ICD-10-CM | POA: Diagnosis not present

## 2021-08-08 DIAGNOSIS — U071 COVID-19: Secondary | ICD-10-CM | POA: Diagnosis not present

## 2021-08-08 NOTE — Assessment & Plan Note (Signed)
Clinically stable/improving. Declines paxlovid. Advised of CDC guidelines for self isolation/ ending isolation.  Advised of safe practice guidelines. Symptom Tier reviewed.  Encouraged to monitor for any worsening symptoms; watch for increased shortness of breath, weakness, and signs of dehydration. Advised when to seek emergency care.  Instructed to rest and hydrate well.  Advised to leave the house during recommended isolation period, only if it is necessary to seek medical care

## 2021-08-08 NOTE — Progress Notes (Signed)
MyChart Video Visit    Virtual Visit via Video Note   This visit type was conducted due to national recommendations for restrictions regarding the COVID-19 Pandemic (e.g. social distancing) in an effort to limit this patient's exposure and mitigate transmission in our community. This patient is at least at moderate risk for complications without adequate follow up. This format is felt to be most appropriate for this patient at this time. Physical exam was limited by quality of the video and audio technology used for the visit. Cma was able to get the patient set up on a video visit.  Patient location: Home Patient and provider in visit Provider location: Office  I discussed the limitations of evaluation and management by telemedicine and the availability of in person appointments. The patient expressed understanding and agreed to proceed.  Visit Date: 08/08/2021  Today's healthcare provider: Nance Pear, NP     Subjective:    Patient ID: Jordan [name], female    DOB: 12-18-1951, 70 y.o.   MRN: [id number]  Chief Complaint  Patient presents with   Chronic pain syndrome    Follow up, doing much better   Covid Positive    Tested  positive 08-05-2021, symptoms started same day.     HPI Patient is in today for a video visit. She complains of sore throat and sneezing. She reports recently testing positive with Covid-19. Her symptoms started while out of town with her family on Saturday and after coming back home Monday she tested positive for Covid-19. She notes that her first symptoms were sore throat and burning sinus but soon her burning sinus resolved. She denies having any loss of appetite at this time. She has 2 Covid-19 vaccines at this time.  Tramadol- She is requesting to continues taking tramadol to manage her back and hip pain. She reports her sports medicine initially prescribed her Voltaren but stopped due to putting too much strain on her kidneys. She reports  doing well while on Voltaren. She continues taking Tramadol 2x daily PRN at this time and reports doing well on it.    Past Medical History:  Diagnosis Date   Cellulitis and abscess of finger, unspecified 401.9   Cellulitis and abscess of foot, except toes    Depressive disorder, not elsewhere classified    GERD (gastroesophageal reflux disease)    Gout, unspecified    History of tobacco abuse 12/01/2018   Hyperglycemia 05/08/2011   Hyperlipidemia    borderline- not on meds   Hypertension    Insomnia    Leg edema    chronic   Obesity, unspecified    Osteoarthritis    Renal insufficiency, mild 05/08/2011   Small bowel obstruction ([place])     Past Surgical History:  Procedure Laterality Date   ABDOMINAL HYSTERECTOMY  2002   infection- no history of cancer   APPENDECTOMY  2002   catacact Right 11/01/2019   [place]   right shoulder rotator cuff repair  06-07-10   TOTAL HIP ARTHROPLASTY Left 1998    Family History  Problem Relation Age of Onset   Diabetes Mother    Cancer Mother        colon/ pancreatic   Colon cancer Mother 72   Hypertension Father    Alcohol abuse Father    Cirrhosis Father    Other Sister        Pyeoderma gangrenosis   Obesity Daughter    Diabetes  Sister        type 2   Kidney failure Sister    Liver disease Sister    Heart attack Brother    Cirrhosis Sister        had NASH, died heart failure    Social History   Socioeconomic History   Marital status: Single    Spouse name: Not on file   Number of children: 2   Years of education: Not on file   Highest education level: Not on file  Occupational History   Occupation: works prn at [employer]    Employer: [employer]  Tobacco Use   Smoking status: Former    Packs/day: 0.25    Types: Cigarettes    Quit date: 12/26/2014    Years since quitting: 6.6   Smokeless tobacco: Never  Vaping Use   Vaping Use: Never used  Substance and Sexual Activity    Alcohol use: Yes    Alcohol/week: 0.0 standard drinks    Comment: rare   Drug use: No   Sexual activity: Not Currently  Other Topics Concern   Not on file  Social History Narrative   2 children (daughter and son) both local. 3 grandchildren, one great grandchild   Works at ED front desk   Divorced (married x 30 years)   Social Determinants of Radio broadcast assistant Strain: Low Risk    Difficulty of Paying Living Expenses: Not hard at all  Food Insecurity: No Food Insecurity   Worried About Charity fundraiser in the Last Year: Never true   Arboriculturist in the Last Year: Never true  Transportation Needs: No Transportation Needs   Lack of Transportation (Medical): No   Lack of Transportation (Non-Medical): No  Physical Activity: Insufficiently Active   Days of Exercise per Week: 3 days   Minutes of Exercise per Session: 30 min  Stress: No Stress Concern Present   Feeling of Stress : Not at all  Social Connections: Moderately Isolated   Frequency of Communication with Friends and Family: More than three times a week   Frequency of Social Gatherings with Friends and Family: More than three times a week   Attends Religious Services: More than 4 times per year   Active Member of [employer] or Organizations: No   Attends Archivist Meetings: Never   Marital Status: Divorced  Human resources officer Violence: Not At Risk   Fear of Current or Ex-Partner: No   Emotionally Abused: No   Physically Abused: No   Sexually Abused: No    Outpatient Medications Prior to Visit  Medication Sig Dispense Refill   allopurinol (ZYLOPRIM) 100 MG tablet TAKE 2 TABLETS BY MOUTH DAILY 180 tablet 1   aspirin EC 81 MG tablet Take 81 mg by mouth daily.     Cholecalciferol 25 MCG (1000 UT) capsule Take 1,000 Units by mouth daily.     DULoxetine (CYMBALTA) 60 MG capsule TAKE 1 CAPSULE BY MOUTH ONCE DAILY 90 capsule 1   furosemide (LASIX) 40 MG tablet TAKE 1 TABLET BY MOUTH ONCE DAILY AS NEEDED 90  tablet 1   hydrochlorothiazide (HYDRODIURIL) 25 MG tablet TAKE 1 TABLET BY MOUTH ONCE DAILY 90 tablet 1   Multiple Vitamin (MULTIVITAMIN WITH MINERALS) TABS tablet Take 1 tablet by mouth daily.     pantoprazole (PROTONIX) 40 MG tablet TAKE 1 TABLET BY MOUTH ONCE DAILY 90 tablet 1   scopolamine (TRANSDERM-SCOP) 1 MG/3DAYS Place 1 patch (1.5 mg total) onto the  skin every 3 (three) days. 3 patch 0   No facility-administered medications prior to visit.    No Known Allergies  Review of Systems  HENT:  Positive for sore throat.        (+)sneezing  Musculoskeletal:  Positive for back pain and joint pain (hip).      Objective:    Physical Exam Constitutional:      Appearance: Normal appearance.  HENT:     Head: Normocephalic and atraumatic.     Right Ear: External ear normal.     Left Ear: External ear normal.  Neurological:     Mental Status: She is alert.  Psychiatric:        Behavior: Behavior normal.        Judgment: Judgment normal.    There were no vitals taken for this visit. Wt Readings from Last 3 Encounters:  07/16/21 276 lb (125.2 kg)  06/26/21 276 lb (125.2 kg)  04/10/21 269 lb 8 oz (122.2 kg)    Diabetic Foot Exam - Simple   No data filed    Lab Results  Component Value Date   WBC 6.4 10/27/2020   HGB 14.3 10/27/2020   HCT 42.2 10/27/2020   PLT 171 10/27/2020   GLUCOSE 111 (H) 06/26/2021   CHOL 190 07/20/2019   TRIG 105 10/08/2020   HDL 50.10 07/20/2019   LDLDIRECT 111.0 07/20/2019   LDLCALC 93 06/10/2018   ALT 39 (H) 03/09/2021   AST 29 03/09/2021   NA 141 06/26/2021   K 4.4 06/26/2021   CL 105 06/26/2021   CREATININE 1.08 06/26/2021   BUN 21 06/26/2021   CO2 29 06/26/2021   TSH 5.65 (H) 04/10/2021   HGBA1C 6.5 06/26/2021   MICROALBUR <0.7 06/10/2018     Gen: Awake, alert, no acute distress Resp: Breathing is even and non-labored Psych: calm/pleasant demeanor Neuro: Alert and Oriented x 3, + facial symmetry, speech is clear.      Assessment & Plan:   Problem List Items Addressed This Visit       Unprioritized   Osteoarthritis    Notes that her pain is well controlled with prn use of tramadol '50mg'$ .  Continue same.       COVID-19 virus infection - Primary    Clinically stable/improving. Declines paxlovid. Advised of CDC guidelines for self isolation/ ending isolation.  Advised of safe practice guidelines. Symptom Tier reviewed.  Encouraged to monitor for any worsening symptoms; watch for increased shortness of breath, weakness, and signs of dehydration. Advised when to seek emergency care.  Instructed to rest and hydrate well.  Advised to leave the house during recommended isolation period, only if it is necessary to seek medical care         No orders of the defined types were placed in this encounter.   I discussed the assessment and treatment plan with the patient. The patient was provided an opportunity to ask questions and all were answered. The patient agreed with the plan and demonstrated an understanding of the instructions.   The patient was advised to call back or seek an in-person evaluation if the symptoms worsen or if the condition fails to improve as anticipated.   I,Shehryar Multimedia programmer as a Education administrator for [employer], NP.,have documented all relevant documentation on the behalf of Nance Pear, NP,as directed by  Nance Pear, NP while in the presence of Nance Pear, NP.  I provided 20 minutes of face-to-face time during this encounter.  Nance Pear, NP [employer] [facility] [employer] [phone] (phone) [phone] (fax)  [place]

## 2021-08-08 NOTE — Assessment & Plan Note (Signed)
Notes that her pain is well controlled with prn use of tramadol '50mg'$ .  Continue same.

## 2021-08-09 ENCOUNTER — Other Ambulatory Visit (HOSPITAL_BASED_OUTPATIENT_CLINIC_OR_DEPARTMENT_OTHER): Payer: Self-pay

## 2021-08-09 ENCOUNTER — Ambulatory Visit: Payer: PPO | Admitting: Gastroenterology

## 2021-08-09 ENCOUNTER — Other Ambulatory Visit: Payer: Self-pay | Admitting: Family

## 2021-08-09 MED ORDER — TRAMADOL HCL 50 MG PO TABS
50.0000 mg | ORAL_TABLET | Freq: Three times a day (TID) | ORAL | 0 refills | Status: DC | PRN
  Filled 2021-08-09: qty 30, 10d supply, fill #0

## 2021-08-09 MED FILL — Duloxetine HCl Enteric Coated Pellets Cap 60 MG (Base Eq): ORAL | 90 days supply | Qty: 90 | Fill #1 | Status: AC

## 2021-08-09 MED FILL — Allopurinol Tab 100 MG: ORAL | 90 days supply | Qty: 180 | Fill #1 | Status: AC

## 2021-08-09 NOTE — Telephone Encounter (Signed)
Requesting: TRAMADOL  Contract: 7/55/22  UDS: 06/26/21 Last Visit: 08/08/21 Next Visit: NONE Last Refill: 06/26/21  Please Advise

## 2021-08-10 ENCOUNTER — Other Ambulatory Visit (HOSPITAL_BASED_OUTPATIENT_CLINIC_OR_DEPARTMENT_OTHER): Payer: Self-pay

## 2021-09-06 ENCOUNTER — Other Ambulatory Visit (HOSPITAL_BASED_OUTPATIENT_CLINIC_OR_DEPARTMENT_OTHER): Payer: Self-pay

## 2021-09-06 ENCOUNTER — Encounter: Payer: Self-pay | Admitting: Gastroenterology

## 2021-09-06 ENCOUNTER — Ambulatory Visit (INDEPENDENT_AMBULATORY_CARE_PROVIDER_SITE_OTHER): Payer: PPO | Admitting: Gastroenterology

## 2021-09-06 VITALS — BP 138/88 | HR 82 | Ht 64.0 in | Wt 266.2 lb

## 2021-09-06 DIAGNOSIS — R1310 Dysphagia, unspecified: Secondary | ICD-10-CM | POA: Diagnosis not present

## 2021-09-06 DIAGNOSIS — Z85038 Personal history of other malignant neoplasm of large intestine: Secondary | ICD-10-CM | POA: Diagnosis not present

## 2021-09-06 DIAGNOSIS — K219 Gastro-esophageal reflux disease without esophagitis: Secondary | ICD-10-CM | POA: Diagnosis not present

## 2021-09-06 DIAGNOSIS — K5730 Diverticulosis of large intestine without perforation or abscess without bleeding: Secondary | ICD-10-CM

## 2021-09-06 MED ORDER — CLENPIQ 10-3.5-12 MG-GM -GM/160ML PO SOLN
1.0000 | Freq: Once | ORAL | 0 refills | Status: AC
  Filled 2021-09-06: qty 320, 1d supply, fill #0

## 2021-09-06 NOTE — Progress Notes (Signed)
Chief Complaint: Dysphagia, colon cancer screening   Referring Provider:     [name], NP   HPI:     Jordan [name] is a 70 y.o. female with a history of HLD, HTN, obesity (BMI 51), osteoarthritis, prior SBO (treated non-operatively), hysterectomy, cholecystectomy, appendectomy, renal insufficiency (creat 1-1.2), referred to the Gastroenterology Clinic for evaluation of dysphagia.  Has had intermittent, solid food dysphagia for the last year or so. Points to mid sternum. Has had to regurgitate fluids out. More frequent now. Weight stable.  No ER evaluations/acute food impactions.  Has a long-standing hx of GERD. Index sxs of HB, regurgitation. previously treated with Nexium for years, then changed to Protonix 40 mg due to insurance coverage. PPI works well for her with rare breakthrough with dietary indiscretions. No prior EGD.   Separately, she is due for colonoscopy for ongoing CRC screening.  Family history notable for CRC- mother.  No lower GI symptoms.   Endoscopic History: - Colonoscopy (04/2006): Diverticulosis, otherwise normal.  Repeat in 5 years due to family history - Colonoscopy (12/2013): Severe sigmoid diverticulosis with angulation and tortuosity.  Repeat in 5 years due to family history   Past Medical History:  Diagnosis Date   Cellulitis and abscess of finger, unspecified 401.9   Cellulitis and abscess of foot, except toes    Depressive disorder, not elsewhere classified    GERD (gastroesophageal reflux disease)    Gout, unspecified    History of tobacco abuse 12/01/2018   Hyperglycemia 05/08/2011   Hyperlipidemia    borderline- not on meds   Hypertension    Insomnia    Leg edema    chronic   Obesity, unspecified    Osteoarthritis    Renal insufficiency, mild 05/08/2011   Small bowel obstruction ([place])      Past Surgical History:  Procedure Laterality Date   ABDOMINAL HYSTERECTOMY  2002   infection- no history of cancer    APPENDECTOMY  2002   catacact Right 11/01/2019   CHOLECYSTECTOMY  1977   ECTOPIC PREGNANCY SURGERY  1971   right shoulder rotator cuff repair  06-07-10   TOTAL HIP ARTHROPLASTY Left 1998   Family History  Problem Relation Age of Onset   Diabetes Mother    Cancer Mother        colon/ pancreatic   Colon cancer Mother 17   Hypertension Father    Alcohol abuse Father    Cirrhosis Father    Other Sister        Pyeoderma gangrenosis   Obesity Daughter    Diabetes Sister        type 2   Kidney failure Sister    Liver disease Sister    Heart attack Brother    Cirrhosis Sister        had NASH, died heart failure   Social History   Tobacco Use   Smoking status: Former    Packs/day: 0.25    Types: Cigarettes    Quit date: 09/25/2020    Years since quitting: 0.9   Smokeless tobacco: Never  Vaping Use   Vaping Use: Never used  Substance Use Topics   Alcohol use: Yes    Alcohol/week: 0.0 standard drinks    Comment: rare   Drug use: No   Current Outpatient Medications  Medication Sig Dispense Refill   allopurinol (ZYLOPRIM) 100 MG tablet TAKE 2 TABLETS BY MOUTH DAILY 180 tablet 1  aspirin EC 81 MG tablet Take 81 mg by mouth daily.     Cholecalciferol 25 MCG (1000 UT) capsule Take 1,000 Units by mouth daily.     DULoxetine (CYMBALTA) 60 MG capsule TAKE 1 CAPSULE BY MOUTH ONCE DAILY 90 capsule 1   furosemide (LASIX) 40 MG tablet TAKE 1 TABLET BY MOUTH ONCE DAILY AS NEEDED 90 tablet 1   hydrochlorothiazide (HYDRODIURIL) 25 MG tablet TAKE 1 TABLET BY MOUTH ONCE DAILY 90 tablet 1   Multiple Vitamin (MULTIVITAMIN WITH MINERALS) TABS tablet Take 1 tablet by mouth daily.     pantoprazole (PROTONIX) 40 MG tablet TAKE 1 TABLET BY MOUTH ONCE DAILY 90 tablet 1   traMADol (ULTRAM) 50 MG tablet Take 1 tablet (50 mg total) by mouth every 8 (eight) hours as needed. 30 tablet 0   No current facility-administered medications for this visit.   No Known Allergies   Review of Systems: All  systems reviewed and negative except where noted in HPI.     Physical Exam:    Wt Readings from Last 3 Encounters:  09/06/21 266 lb 4 oz (120.8 kg)  07/16/21 276 lb (125.2 kg)  06/26/21 276 lb (125.2 kg)    Ht '5\' 4"'$  (1.626 m)   Wt 266 lb 4 oz (120.8 kg)   BMI 45.70 kg/m  Constitutional:  Pleasant, in no acute distress. Cardiovascular: Normal rate, regular rhythm. No edema Pulmonary/chest: Effort normal and breath sounds normal. No wheezing, rales or rhonchi. Abdominal: Soft, nondistended, nontender. Bowel sounds active throughout. There are no masses palpable. No hepatomegaly. Neurological: Alert and oriented to person place and time. Skin: Skin is warm and dry. No rashes noted.   ASSESSMENT AND PLAN;   1) Dysphagia 2) GERD - EGD with possible esophageal dilation - Evaluate for esophageal stricture, luminal narrowing, etc. - Evaluate for LES laxity, hiatal hernia, erosive esophagitis - Continue PPI as prescribed - Continue antireflux lifestyle/dietary modifications - Advised patient to cut food into small pieces, eat small bites, chew food thoroughly and with plenty of liquids to avoid food impaction.  3) Family history of colon cancer (mother) - Due for ongoing colon cancer screening - Schedule colonoscopy  4) Diverticulosis - History of diverticulosis and previous colonoscopy with tortuosity and acute angulation.  Plan to start with pediatric colonoscope  The indications, risks, and benefits of EGD and colonoscopy were explained to the patient in detail. Risks include but are not limited to bleeding, perforation, adverse reaction to medications, and cardiopulmonary compromise. Sequelae include but are not limited to the possibility of surgery, hospitalization, and mortality. The patient verbalized understanding and wished to proceed. All questions answered, referred to scheduler and bowel prep ordered. Further recommendations pending results of the exam.     Lavena Bullion, DO, FACG  09/06/2021, 2:37 PM   [name], NP

## 2021-09-06 NOTE — Patient Instructions (Signed)
If you are age 71 or older, your body mass index should be between 23-30. Your Body mass index is 45.7 kg/m. If this is out of the aforementioned range listed, please consider follow up with your Primary Care Provider.  If you are age 76 or younger, your body mass index should be between 19-25. Your Body mass index is 45.7 kg/m. If this is out of the aformentioned range listed, please consider follow up with your Primary Care Provider.   __________________________________________________________  The [facility] providers would like to encourage you to use [facility] to communicate with providers for non-urgent requests or questions.  Due to long hold times on the telephone, sending your provider a message by [facility] may be a faster and more efficient way to get a response.  Please allow 48 business hours for a response.  Please remember that this is for non-urgent requests.   You have been scheduled for an endoscopy and colonoscopy. Please follow the written instructions given to you at your visit today. Please pick up your prep supplies at the pharmacy within the next 1-3 days. If you use inhalers (even only as needed), please bring them with you on the day of your procedure.  We have sent the following medications to your pharmacy for you to pick up at your convenience: Clenpiq  It was a pleasure to see you today!  Vito Cirigliano, D.O.

## 2021-09-14 ENCOUNTER — Telehealth: Payer: Self-pay | Admitting: Gastroenterology

## 2021-09-14 ENCOUNTER — Other Ambulatory Visit (HOSPITAL_BASED_OUTPATIENT_CLINIC_OR_DEPARTMENT_OTHER): Payer: Self-pay

## 2021-09-14 NOTE — Telephone Encounter (Signed)
Inbound call from patient stating Clenpiq is over $100 and is requesting more affordable to be sent to the pharmacy.  Please advise.

## 2021-09-17 ENCOUNTER — Encounter: Payer: Self-pay | Admitting: Family

## 2021-09-17 ENCOUNTER — Other Ambulatory Visit (HOSPITAL_BASED_OUTPATIENT_CLINIC_OR_DEPARTMENT_OTHER): Payer: Self-pay

## 2021-09-17 MED ORDER — CEPHALEXIN 500 MG PO CAPS
500.0000 mg | ORAL_CAPSULE | Freq: Two times a day (BID) | ORAL | 0 refills | Status: DC
Start: 2021-09-17 — End: 2021-10-01
  Filled 2021-09-17 (×2): qty 10, 5d supply, fill #0

## 2021-09-17 NOTE — Telephone Encounter (Signed)
Left a detailed message for the patient stating that I would change her prep to Miralax/ducolax and send the instructions via mychart. Told patient to call with any questions

## 2021-09-21 ENCOUNTER — Ambulatory Visit (AMBULATORY_SURGERY_CENTER): Payer: PPO | Admitting: Gastroenterology

## 2021-09-21 ENCOUNTER — Other Ambulatory Visit: Payer: Self-pay

## 2021-09-21 ENCOUNTER — Encounter: Payer: Self-pay | Admitting: Gastroenterology

## 2021-09-21 VITALS — BP 144/88 | HR 67 | Temp 98.9°F | Resp 20 | Ht 64.0 in | Wt 263.0 lb

## 2021-09-21 DIAGNOSIS — R1310 Dysphagia, unspecified: Secondary | ICD-10-CM

## 2021-09-21 DIAGNOSIS — Z800 Family history of malignant neoplasm of digestive organs: Secondary | ICD-10-CM

## 2021-09-21 DIAGNOSIS — Z1211 Encounter for screening for malignant neoplasm of colon: Secondary | ICD-10-CM

## 2021-09-21 DIAGNOSIS — K219 Gastro-esophageal reflux disease without esophagitis: Secondary | ICD-10-CM

## 2021-09-21 DIAGNOSIS — Z85038 Personal history of other malignant neoplasm of large intestine: Secondary | ICD-10-CM | POA: Diagnosis not present

## 2021-09-21 DIAGNOSIS — K2970 Gastritis, unspecified, without bleeding: Secondary | ICD-10-CM

## 2021-09-21 DIAGNOSIS — K259 Gastric ulcer, unspecified as acute or chronic, without hemorrhage or perforation: Secondary | ICD-10-CM | POA: Diagnosis not present

## 2021-09-21 DIAGNOSIS — K5730 Diverticulosis of large intestine without perforation or abscess without bleeding: Secondary | ICD-10-CM

## 2021-09-21 MED ORDER — SODIUM CHLORIDE 0.9 % IV SOLN
500.0000 mL | Freq: Once | INTRAVENOUS | Status: DC
Start: 2021-09-21 — End: 2021-09-21

## 2021-09-21 NOTE — Progress Notes (Signed)
Pt Drowsy. VSS. To PACU, report to RN. No anesthetic complications noted.  

## 2021-09-21 NOTE — Op Note (Signed)
[place] Patient Name: Jordan [name] Procedure Date: 09/21/2021 11:36 AM MRN: [id number] Endoscopist: Gerrit Heck , MD Age: 70 Referring MD:  Date of Birth: 25-Dec-1950 Gender: Female Account #: [id number] Procedure:                Upper GI endoscopy Indications:              Dysphagia, Esophageal reflux Medicines:                Monitored Anesthesia Care Procedure:                Pre-Anesthesia Assessment:                           - Prior to the procedure, a History and Physical                            was performed, and patient medications and                            allergies were reviewed. The patient's tolerance of                            previous anesthesia was also reviewed. The risks                            and benefits of the procedure and the sedation                            options and risks were discussed with the patient.                            All questions were answered, and informed consent                            was obtained. Prior Anticoagulants: The patient has                            taken no previous anticoagulant or antiplatelet                            agents. ASA Grade Assessment: II - A patient with                            mild systemic disease. After reviewing the risks                            and benefits, the patient was deemed in                            satisfactory condition to undergo the procedure.                           After obtaining informed consent, the endoscope was  passed under direct vision. Throughout the                            procedure, the patient's blood pressure, pulse, and                            oxygen saturations were monitored continuously. The                            GIF [id number] #[id number] was introduced through the                            mouth, and advanced to the second part of duodenum.                            The upper GI endoscopy was  accomplished without                            difficulty. The patient tolerated the procedure                            well. Scope In: Scope Out: Findings:                 The examined esophagus was normal. Given the                            dysphagia symptoms, the decision was made to                            perform empiric esophageal dilation. A guidewire                            was placed and the scope was withdrawn. Dilation                            was performed with a Savary dilator with no                            resistance at 18 mm. The dilation site was examined                            following endoscope reinsertion and showed no                            bleeding, mucosal tear or perforation. Estimated                            blood loss: none.                           Localized mild inflammation characterized by                            erythema was found in the gastric  antrum. Biopsies                            were taken with a cold forceps for Helicobacter                            pylori testing. Estimated blood loss was minimal.                           The examined duodenum was normal. Complications:            No immediate complications. Estimated Blood Loss:     Estimated blood loss was minimal. Impression:               - Normal esophagus. Dilated with 18 mm Savary                            dilator.                           - Gastritis. Biopsied.                           - Normal examined duodenum. Recommendation:           - Patient has a contact number available for                            emergencies. The signs and symptoms of potential                            delayed complications were discussed with the                            patient. Return to normal activities tomorrow.                            Written discharge instructions were provided to the                            patient.                           - Resume  previous diet.                           - Continue present medications.                           - Await pathology results.                           - Repeat upper endoscopy PRN for retreatment.                           - Return to GI clinic PRN. Gerrit Heck, MD 09/21/2021 12:16:23 PM

## 2021-09-21 NOTE — Patient Instructions (Signed)
Handouts on gastritis and diverticulosis. Await pathology results. Repeat upper endoscopy as needed for re-treatment. Follow-up in the GI clinic in 5 years to discuss appropriate timing of repeat colonoscopy.   YOU HAD AN ENDOSCOPIC PROCEDURE TODAY AT [facility]:   Refer to the procedure report that was given to you for any specific questions about what was found during the examination.  If the procedure report does not answer your questions, please call your gastroenterologist to clarify.  If you requested that your care partner not be given the details of your procedure findings, then the procedure report has been included in a sealed envelope for you to review at your convenience later.  YOU SHOULD EXPECT: Some feelings of bloating in the abdomen. Passage of more gas than usual.  Walking can help get rid of the air that was put into your GI tract during the procedure and reduce the bloating. If you had a lower endoscopy (such as a colonoscopy or flexible sigmoidoscopy) you may notice spotting of blood in your stool or on the toilet paper. If you underwent a bowel prep for your procedure, you may not have a normal bowel movement for a few days.  Please Note:  You might notice some irritation and congestion in your nose or some drainage.  This is from the oxygen used during your procedure.  There is no need for concern and it should clear up in a day or so.  SYMPTOMS TO REPORT IMMEDIATELY:  Following lower endoscopy (colonoscopy or flexible sigmoidoscopy):  Excessive amounts of blood in the stool  Significant tenderness or worsening of abdominal pains  Swelling of the abdomen that is new, acute  Fever of 100F or higher  Following upper endoscopy (EGD)  Vomiting of blood or coffee ground material  New chest pain or pain under the shoulder blades  Painful or persistently difficult swallowing  New shortness of breath  Fever of 100F or higher  Black, tarry-looking  stools  For urgent or emergent issues, a gastroenterologist can be reached at any hour by calling [phone]. Do not use MyChart messaging for urgent concerns.    DIET:  We do recommend a small meal at first, but then you may proceed to your regular diet.  Drink plenty of fluids but you should avoid alcoholic beverages for 24 hours.  ACTIVITY:  You should plan to take it easy for the rest of today and you should NOT DRIVE or use heavy machinery until tomorrow (because of the sedation medicines used during the test).    FOLLOW UP: Our staff will call the number listed on your records 48-72 hours following your procedure to check on you and address any questions or concerns that you may have regarding the information given to you following your procedure. If we do not reach you, we will leave a message.  We will attempt to reach you two times.  During this call, we will ask if you have developed any symptoms of COVID 19. If you develop any symptoms (ie: fever, flu-like symptoms, shortness of breath, cough etc.) before then, please call [phone].  If you test positive for Covid 19 in the 2 weeks post procedure, please call and report this information to [place].    If any biopsies were taken you will be contacted by phone or by letter within the next 1-3 weeks.  Please call us at [phone] if you have not heard about the biopsies in 3 weeks.    SIGNATURES/CONFIDENTIALITY:  You and/or your care partner have signed paperwork which will be entered into your electronic medical record.  These signatures attest to the fact that that the information above on your After Visit Summary has been reviewed and is understood.  Full responsibility of the confidentiality of this discharge information lies with you and/or your care-partner.

## 2021-09-21 NOTE — Progress Notes (Signed)
Pt's states no medical or surgical changes since previsit or office visit. 

## 2021-09-21 NOTE — Progress Notes (Signed)
Called to room to assist during endoscopic procedure.  Patient ID and intended procedure confirmed with present staff. Received instructions for my participation in the procedure from the performing physician.  

## 2021-09-21 NOTE — Telephone Encounter (Signed)
Lm on vm letting patient know her appt has been moved to 11 am on the 4th floor and she will need to arrive at 10 am with a care partner. Raquel Sarna in [facility] admitting is aware.

## 2021-09-21 NOTE — Op Note (Signed)
[place] Patient Name: Jordan [name] Procedure Date: 09/21/2021 11:36 AM MRN: [id number] Endoscopist: Gerrit Heck , MD Age: 70 Referring MD:  Date of Birth: 1951/08/16 Gender: Female Account #: [id number] Procedure:                Colonoscopy Indications:              Colon cancer screening in patient at increased                            risk: Colorectal cancer in mother Medicines:                Monitored Anesthesia Care Procedure:                Pre-Anesthesia Assessment:                           - Prior to the procedure, a History and Physical                            was performed, and patient medications and                            allergies were reviewed. The patient's tolerance of                            previous anesthesia was also reviewed. The risks                            and benefits of the procedure and the sedation                            options and risks were discussed with the patient.                            All questions were answered, and informed consent                            was obtained. Prior Anticoagulants: The patient has                            taken no previous anticoagulant or antiplatelet                            agents. ASA Grade Assessment: II - A patient with                            mild systemic disease. After reviewing the risks                            and benefits, the patient was deemed in                            satisfactory condition to undergo the procedure.  After obtaining informed consent, the colonoscope                            was passed under direct vision. Throughout the                            procedure, the patient's blood pressure, pulse, and                            oxygen saturations were monitored continuously. The                            PCF-HQ190L Colonoscope was introduced through the                            anus and advanced to the the  cecum, identified by                            appendiceal orifice and ileocecal valve. The                            colonoscopy was technically difficult and complex                            due to a tortuous colon. Successful completion of                            the procedure was aided by applying abdominal                            pressure. The patient tolerated the procedure well.                            The quality of the bowel preparation was good. The                            ileocecal valve, appendiceal orifice, and rectum                            were photographed. Scope In: 11:48:02 AM Scope Out: 12:09:33 PM Scope Withdrawal Time: 0 hours 12 minutes 42 seconds  Total Procedure Duration: 0 hours 21 minutes 31 seconds  Findings:                 The perianal and digital rectal examinations were                            normal.                           Multiple small and large-mouthed diverticula were                            found in the sigmoid colon and descending colon.  There was significant tortuosity with acute                            angulation in the sigmoid colon.                           There were 3 medium to large sized lipomas in the                            ascending colon.                           The exam was otherwise normal throughout the                            examined colon.                           The retroflexed view of the distal rectum and anal                            verge was normal and showed no anal or rectal                            abnormalities. Complications:            No immediate complications. Estimated Blood Loss:     Estimated blood loss: none. Impression:               - Diverticulosis in the sigmoid colon and in the                            descending colon. There was significant tortuosity                            with acute angulation in the sigmoid colon.                            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                           - The distal rectum and anal verge are normal on                            retroflexion view.                           - No specimens collected. Recommendation:           - Patient has a contact number available for                            emergencies. The signs and symptoms of potential  delayed complications were discussed with the                            patient. Return to normal activities tomorrow.                            Written discharge instructions were provided to the                            patient.                           - Resume previous diet.                           - Continue present medications.                           - Follow-up in the GI clinic in 5 years to discuss                            appropriate timing of repeat colonoscopy.                           - Return to GI office PRN. Gerrit Heck, MD 09/21/2021 12:24:31 PM

## 2021-09-21 NOTE — Progress Notes (Signed)
GASTROENTEROLOGY PROCEDURE H&P NOTE   Primary Care Physician: [name], NP    Reason for Procedure:   Dysphagia, GERD, Family history of colon cancer  Plan:    EGD with biopsies and dilation; Colonoscopy  Patient is appropriate for endoscopic procedure(s) in the ambulatory ([place]) setting.  The nature of the procedure, as well as the risks, benefits, and alternatives were carefully and thoroughly reviewed with the patient. Ample time for discussion and questions allowed. The patient understood, was satisfied, and agreed to proceed.     HPI: Jordan [name] is a 70 y.o. female who presents for EGD and Colonoscopy for evaluation of dysphagia, GERD, and family history of colon cancer (mother).  Patient was most recently seen in the Gastroenterology Clinic on 09/06/2021 by me.  No interval change in medical history since that appointment. Please refer to that note for full details regarding GI history and clinical presentation.   Past Medical History:  Diagnosis Date   Cellulitis and abscess of finger, unspecified 401.9   Cellulitis and abscess of foot, except toes    Depressive disorder, not elsewhere classified    GERD (gastroesophageal reflux disease)    Gout, unspecified    History of tobacco abuse 12/01/2018   Hyperglycemia 05/08/2011   Hyperlipidemia    borderline- not on meds   Hypertension    Insomnia    Leg edema    chronic   Obesity, unspecified    Osteoarthritis    Renal insufficiency, mild 05/08/2011   Small bowel obstruction [facility])     Past Surgical History:  Procedure Laterality Date   ABDOMINAL HYSTERECTOMY  2002   infection- no history of cancer   APPENDECTOMY  2002   catacact Right 11/01/2019   CHOLECYSTECTOMY  1977   ECTOPIC PREGNANCY SURGERY  1971   right shoulder rotator cuff repair  06-07-10   TOTAL HIP ARTHROPLASTY Left 1998    Prior to Admission medications   Medication Sig Start Date End Date Taking? Authorizing Provider  allopurinol  (ZYLOPRIM) 100 MG tablet TAKE 2 TABLETS BY MOUTH DAILY 03/09/21 03/09/22 Yes [name], NP  aspirin EC 81 MG tablet Take 81 mg by mouth daily.   Yes [provider]  cephALEXin (KEFLEX) 500 MG capsule Take 1 capsule (500 mg total) by mouth 2 (two) times daily. 09/17/21  Yes [name], NP  Cholecalciferol 25 MCG (1000 UT) capsule Take 1,000 Units by mouth daily.   Yes [provider]  DULoxetine (CYMBALTA) 60 MG capsule TAKE 1 CAPSULE BY MOUTH ONCE DAILY 03/09/21 03/09/22 Yes [name], NP  hydrochlorothiazide (HYDRODIURIL) 25 MG tablet TAKE 1 TABLET BY MOUTH ONCE DAILY 03/09/21 03/09/22 Yes [name], NP  Multiple Vitamin (MULTIVITAMIN WITH MINERALS) TABS tablet Take 1 tablet by mouth daily.   Yes [provider]  pantoprazole (PROTONIX) 40 MG tablet TAKE 1 TABLET BY MOUTH ONCE DAILY 03/09/21 03/09/22 Yes [name], NP  furosemide (LASIX) 40 MG tablet TAKE 1 TABLET BY MOUTH ONCE DAILY AS NEEDED 03/09/21 03/09/22  [name], NP  famotidine (PEPCID AC) 10 MG chewable tablet Chew 10 mg by mouth daily.  03/03/12  [provider]    Current Outpatient Medications  Medication Sig Dispense Refill   allopurinol (ZYLOPRIM) 100 MG tablet TAKE 2 TABLETS BY MOUTH DAILY 180 tablet 1   aspirin EC 81 MG tablet Take 81 mg by mouth daily.     cephALEXin (KEFLEX) 500 MG capsule Take 1 capsule (500 mg total) by mouth 2 (two)  times daily. 10 capsule 0   Cholecalciferol 25 MCG (1000 UT) capsule Take 1,000 Units by mouth daily.     DULoxetine (CYMBALTA) 60 MG capsule TAKE 1 CAPSULE BY MOUTH ONCE DAILY 90 capsule 1   hydrochlorothiazide (HYDRODIURIL) 25 MG tablet TAKE 1 TABLET BY MOUTH ONCE DAILY 90 tablet 1   Multiple Vitamin (MULTIVITAMIN WITH MINERALS) TABS tablet Take 1 tablet by mouth daily.     pantoprazole (PROTONIX) 40 MG tablet TAKE 1 TABLET BY MOUTH ONCE DAILY 90 tablet 1   furosemide (LASIX) 40 MG tablet TAKE 1 TABLET BY  MOUTH ONCE DAILY AS NEEDED 90 tablet 1   Current Facility-Administered Medications  Medication Dose Route Frequency Provider Last Rate Last Admin   0.9 %  sodium chloride infusion  500 mL Intravenous Once Sheriece Jefcoat V, DO        Allergies as of 09/21/2021   (No Known Allergies)    Family History  Problem Relation Age of Onset   Diabetes Mother    Cancer Mother        colon/ pancreatic   Colon cancer Mother 58   Hypertension Father    Alcohol abuse Father    Cirrhosis Father    Other Sister        Pyeoderma gangrenosis   Diabetes Sister        type 2   Kidney failure Sister    Liver disease Sister    Cirrhosis Sister        had NASH, died heart failure   Heart attack Brother    Stomach cancer Maternal Grandfather    Obesity Daughter    Rectal cancer Neg Hx    Esophageal cancer Neg Hx     Social History   Socioeconomic History   Marital status: Divorced    Spouse name: Not on file   Number of children: 2   Years of education: Not on file   Highest education level: Not on file  Occupational History   Occupation: works prn at [employer]    Employer: [employer]  Tobacco Use   Smoking status: Former    Packs/day: 0.25    Types: Cigarettes    Quit date: 09/25/2020    Years since quitting: 0.9   Smokeless tobacco: Never  Vaping Use   Vaping Use: Never used  Substance and Sexual Activity   Alcohol use: Yes    Alcohol/week: 0.0 standard drinks    Comment: rare   Drug use: No   Sexual activity: Not Currently  Other Topics Concern   Not on file  Social History Narrative   2 children (daughter and son) both local. 3 grandchildren, one great grandchild   Works at ED front desk   Divorced (married x 30 years)   Social Determinants of Radio broadcast assistant Strain: Low Risk    Difficulty of Paying Living Expenses: Not hard at all  Food Insecurity: No Food Insecurity   Worried About Charity fundraiser in the Last Year: Never true   Arboriculturist in the  Last Year: Never true  Transportation Needs: No Transportation Needs   Lack of Transportation (Medical): No   Lack of Transportation (Non-Medical): No  Physical Activity: Insufficiently Active   Days of Exercise per Week: 3 days   Minutes of Exercise per Session: 30 min  Stress: No Stress Concern Present   Feeling of Stress : Not at all  Social Connections: Moderately Isolated   Frequency of Communication with  Friends and Family: More than three times a week   Frequency of Social Gatherings with Friends and Family: More than three times a week   Attends Religious Services: More than 4 times per year   Active Member of [employer] or Organizations: No   Attends Archivist Meetings: Never   Marital Status: Divorced  Human resources officer Violence: Not At Risk   Fear of Current or Ex-Partner: No   Emotionally Abused: No   Physically Abused: No   Sexually Abused: No    Physical Exam: Vital signs in last 24 hours: @BP  (!) 154/84   Pulse 80   Temp 98.9 F (37.2 C)   Ht 5\' 4"  (1.626 m)   Wt 263 lb (119.3 kg)   SpO2 97%   BMI 45.14 kg/m  GEN: NAD EYE: Sclerae anicteric ENT: MMM CV: Non-tachycardic Pulm: CTA b/l GI: Soft, NT/ND NEURO:  Alert & Oriented x 3   Gerrit Heck, DO [facility]   09/21/2021 11:32 AM

## 2021-09-25 ENCOUNTER — Telehealth: Payer: Self-pay | Admitting: *Deleted

## 2021-09-25 ENCOUNTER — Other Ambulatory Visit (HOSPITAL_BASED_OUTPATIENT_CLINIC_OR_DEPARTMENT_OTHER): Payer: Self-pay

## 2021-09-25 ENCOUNTER — Telehealth: Payer: Self-pay

## 2021-09-25 ENCOUNTER — Other Ambulatory Visit: Payer: Self-pay | Admitting: Family

## 2021-09-25 NOTE — Telephone Encounter (Signed)
Attempted 2nd phone call. No answer. Left message.

## 2021-09-25 NOTE — Telephone Encounter (Signed)
NO ANSWER, MESSAGE LEFT FOR PATIENT. 

## 2021-09-26 MED ORDER — TRAMADOL HCL 50 MG PO TABS
50.0000 mg | ORAL_TABLET | Freq: Three times a day (TID) | ORAL | 0 refills | Status: DC | PRN
  Filled 2021-09-26: qty 30, 10d supply, fill #0

## 2021-09-26 NOTE — Telephone Encounter (Signed)
Refill request for tramadol- no longer on med list. Please advise?  

## 2021-09-27 ENCOUNTER — Other Ambulatory Visit (HOSPITAL_BASED_OUTPATIENT_CLINIC_OR_DEPARTMENT_OTHER): Payer: Self-pay

## 2021-09-27 ENCOUNTER — Encounter: Payer: Self-pay | Admitting: Gastroenterology

## 2021-09-27 MED ORDER — FLUAD QUADRIVALENT 0.5 ML IM PRSY
PREFILLED_SYRINGE | INTRAMUSCULAR | 0 refills | Status: DC
  Filled 2021-09-27: qty 0.5, 1d supply, fill #0

## 2021-09-30 ENCOUNTER — Encounter: Payer: Self-pay | Admitting: Family

## 2021-10-01 ENCOUNTER — Other Ambulatory Visit: Payer: Self-pay

## 2021-10-01 ENCOUNTER — Encounter: Payer: Self-pay | Admitting: Family

## 2021-10-01 ENCOUNTER — Ambulatory Visit (INDEPENDENT_AMBULATORY_CARE_PROVIDER_SITE_OTHER): Payer: PPO | Admitting: Family Medicine

## 2021-10-01 ENCOUNTER — Encounter: Payer: Self-pay | Admitting: Family Medicine

## 2021-10-01 ENCOUNTER — Other Ambulatory Visit (HOSPITAL_COMMUNITY)
Admission: RE | Admit: 2021-10-01 | Discharge: 2021-10-01 | Disposition: A | Discharge status: Home / Self Care | Payer: PPO | Source: Ambulatory Visit | Attending: Family Medicine | Admitting: Family Medicine

## 2021-10-01 ENCOUNTER — Other Ambulatory Visit (HOSPITAL_BASED_OUTPATIENT_CLINIC_OR_DEPARTMENT_OTHER): Payer: Self-pay

## 2021-10-01 VITALS — BP 140/100 | HR 81 | Temp 98.2°F | Resp 18 | Ht 64.0 in | Wt 268.2 lb

## 2021-10-01 DIAGNOSIS — N949 Unspecified condition associated with female genital organs and menstrual cycle: Secondary | ICD-10-CM | POA: Diagnosis not present

## 2021-10-01 DIAGNOSIS — B354 Tinea corporis: Secondary | ICD-10-CM

## 2021-10-01 DIAGNOSIS — R300 Dysuria: Secondary | ICD-10-CM | POA: Diagnosis not present

## 2021-10-01 LAB — POC URINALSYSI DIPSTICK (AUTOMATED)
Bilirubin, UA: NEGATIVE
Blood, UA: NEGATIVE
Glucose, UA: NEGATIVE
Ketones, UA: NEGATIVE
Nitrite, UA: NEGATIVE
Protein, UA: POSITIVE — AB
Spec Grav, UA: 1.02 (ref 1.010–1.025)
Urobilinogen, UA: 0.2 E.U./dL
pH, UA: 6 (ref 5.0–8.0)

## 2021-10-01 MED ORDER — NYSTATIN 100000 UNIT/GM EX POWD
1.0000 "application " | Freq: Three times a day (TID) | CUTANEOUS | 0 refills | Status: DC
  Filled 2021-10-01: qty 15, 5d supply, fill #0

## 2021-10-01 MED ORDER — NITROFURANTOIN MONOHYD MACRO 100 MG PO CAPS
100.0000 mg | ORAL_CAPSULE | Freq: Two times a day (BID) | ORAL | 0 refills | Status: DC
  Filled 2021-10-01: qty 14, 7d supply, fill #0

## 2021-10-01 MED ORDER — FLUCONAZOLE 150 MG PO TABS
ORAL_TABLET | ORAL | 0 refills | Status: DC
  Filled 2021-10-01: qty 2, 4d supply, fill #0

## 2021-10-01 NOTE — Addendum Note (Signed)
Addended by: Sanda Linger on: 10/01/2021 04:34 PM   Modules accepted: Orders

## 2021-10-01 NOTE — Telephone Encounter (Signed)
Appt scheduled w/ Dr. Lowne.  

## 2021-10-01 NOTE — Patient Instructions (Signed)
Dysuria ?Dysuria is pain or discomfort during urination. The pain or discomfort may be felt in the part of the body that drains urine from the bladder (urethra) or in the surrounding tissue of the genitals. The pain may also be felt in the groin area, lower abdomen, or lower back. ?You may have to urinate frequently or have the sudden feeling that you have to urinate (urgency). Dysuria can affect anyone, but it is more common in females. Dysuria can be caused by many different things, including: ?Urinary tract infection. ?Kidney stones or bladder stones. ?Certain STIs (sexually transmitted infections), such as chlamydia. ?Dehydration. ?Inflammation of the tissues of the vagina. ?Use of certain medicines. ?Use of certain soaps or scented products that cause irritation. ?Follow these instructions at home: ?Medicines ?Take over-the-counter and prescription medicines only as told by your health care provider. ?If you were prescribed an antibiotic medicine, take it as told by your health care provider. Do not stop taking the antibiotic even if you start to feel better. ?Eating and drinking ? ?Drink enough fluid to keep your urine pale yellow. ?Avoid caffeinated beverages, tea, and alcohol. These beverages can irritate the bladder and make dysuria worse. In males, alcohol may irritate the prostate. ?General instructions ?Watch your condition for any changes. ?Urinate often. Avoid holding urine for long periods of time. ?If you are female, you should wipe from front to back after urinating or having a bowel movement. Use each piece of toilet paper only once. ?Empty your bladder after sex. ?Keep all follow-up visits. This is important. ?If you had any tests done to find the cause of dysuria, it is up to you to get your test results. Ask your health care provider, or the department that is doing the test, when your results will be ready. ?Contact a health care provider if: ?You have a fever. ?You develop pain in your back or  sides. ?You have nausea or vomiting. ?You have blood in your urine. ?You are not urinating as often as you usually do. ?Get help right away if: ?Your pain is severe and not relieved with medicines. ?You cannot eat or drink without vomiting. ?You are confused. ?You have a rapid heartbeat while resting. ?You have shaking or chills. ?You feel extremely weak. ?Summary ?Dysuria is pain or discomfort while urinating. Many different conditions can lead to dysuria. ?If you have dysuria, you may have to urinate frequently or have the sudden feeling that you have to urinate (urgency). ?Watch your condition for any changes. Keep all follow-up visits. ?Make sure that you urinate often and drink enough fluid to keep your urine pale yellow. ?This information is not intended to replace advice given to you by your health care provider. Make sure you discuss any questions you have with your health care provider. ?Document Revised: 07/21/2020 Document Reviewed: 07/21/2020 ?Elsevier Patient Education ? 2022 [employer] ? ?

## 2021-10-01 NOTE — Progress Notes (Signed)
Subjective:   By signing my name below, I, Zite Okoli, attest that this documentation has been prepared under the direction and in the presence of Ann Held, DO. 10/01/2021     Patient ID: Jordan [name], female    DOB: May 25, 1951, 70 y.o.   MRN: [id number]  Chief Complaint  Patient presents with   Dysuria    X2 weeks, pt states burning got worse over the weekend. Pt states having headache and nausea. Pt states trying Monistat 3 and using Azo.    HPI Patient is in today for an office visit.  She reports that 2 weeks ago, she started itching and burning in her vaginal area and went to the store to buy Monistat. She used it for 3 days but it did not help. She then bought AZO which did not provide any relief. She reports she feels raw in her vaginal area and it is burning with more intensity. She is also experiencing bouts of nausea and dizziness. She was prescribed Keflex by her primary care provider and it provides little relief but there is still a rash on her vulva and labia. She endorses vaginal discharge and has been feeling full in her lower abdomen.   No back pain or abd pain.  No fevers.    Past Medical History:  Diagnosis Date   Cellulitis and abscess of finger, unspecified 401.9   Cellulitis and abscess of foot, except toes    Depressive disorder, not elsewhere classified    GERD (gastroesophageal reflux disease)    Gout, unspecified    History of tobacco abuse 12/01/2018   Hyperglycemia 05/08/2011   Hyperlipidemia    borderline- not on meds   Hypertension    Insomnia    Leg edema    chronic   Obesity, unspecified    Osteoarthritis    Renal insufficiency, mild 05/08/2011   Small bowel obstruction ([place])     Past Surgical History:  Procedure Laterality Date   ABDOMINAL HYSTERECTOMY  2002   infection- no history of cancer   APPENDECTOMY  2002   catacact Right 11/01/2019   CHOLECYSTECTOMY  1977   ECTOPIC PREGNANCY SURGERY  1971   right shoulder rotator  cuff repair  06-07-10   TOTAL HIP ARTHROPLASTY Left 1998    Family History  Problem Relation Age of Onset   Diabetes Mother    Cancer Mother        colon/ pancreatic   Colon cancer Mother 29   Hypertension Father    Alcohol abuse Father    Cirrhosis Father    Other Sister        Pyeoderma gangrenosis   Diabetes Sister        type 2   Kidney failure Sister    Liver disease Sister    Cirrhosis Sister        had NASH, died heart failure   Heart attack Brother    Stomach cancer Maternal Grandfather    Obesity Daughter    Rectal cancer Neg Hx    Esophageal cancer Neg Hx     Social History   Socioeconomic History   Marital status: Divorced    Spouse name: Not on file   Number of children: 2   Years of education: Not on file   Highest education level: Not on file  Occupational History   Occupation: works prn at [employer]    Employer: [employer]  Tobacco Use   Smoking status: Former    Packs/day:  0.25    Types: Cigarettes    Quit date: 09/25/2020    Years since quitting: 1.0   Smokeless tobacco: Never  Vaping Use   Vaping Use: Never used  Substance and Sexual Activity   Alcohol use: Yes    Alcohol/week: 0.0 standard drinks    Comment: rare   Drug use: No   Sexual activity: Not Currently  Other Topics Concern   Not on file  Social History Narrative   2 children (daughter and son) both local. 3 grandchildren, one great grandchild   Works at ED front desk   Divorced (married x 30 years)   Social Determinants of Radio broadcast assistant Strain: Low Risk    Difficulty of Paying Living Expenses: Not hard at all  Food Insecurity: No Food Insecurity   Worried About Charity fundraiser in the Last Year: Never true   Arboriculturist in the Last Year: Never true  Transportation Needs: No Transportation Needs   Lack of Transportation (Medical): No   Lack of Transportation (Non-Medical): No  Physical Activity: Insufficiently Active   Days of Exercise per Week: 3 days    Minutes of Exercise per Session: 30 min  Stress: No Stress Concern Present   Feeling of Stress : Not at all  Social Connections: Moderately Isolated   Frequency of Communication with Friends and Family: More than three times a week   Frequency of Social Gatherings with Friends and Family: More than three times a week   Attends Religious Services: More than 4 times per year   Active Member of [employer] or Organizations: No   Attends Archivist Meetings: Never   Marital Status: Divorced  Human resources officer Violence: Not At Risk   Fear of Current or Ex-Partner: No   Emotionally Abused: No   Physically Abused: No   Sexually Abused: No    Outpatient Medications Prior to Visit  Medication Sig Dispense Refill   allopurinol (ZYLOPRIM) 100 MG tablet TAKE 2 TABLETS BY MOUTH DAILY 180 tablet 1   aspirin EC 81 MG tablet Take 81 mg by mouth daily.     Cholecalciferol 25 MCG (1000 UT) capsule Take 1,000 Units by mouth daily.     DULoxetine (CYMBALTA) 60 MG capsule TAKE 1 CAPSULE BY MOUTH ONCE DAILY 90 capsule 1   furosemide (LASIX) 40 MG tablet TAKE 1 TABLET BY MOUTH ONCE DAILY AS NEEDED 90 tablet 1   hydrochlorothiazide (HYDRODIURIL) 25 MG tablet TAKE 1 TABLET BY MOUTH ONCE DAILY 90 tablet 1   influenza vaccine adjuvanted (FLUAD QUADRIVALENT) 0.5 ML injection Inject into the muscle. 0.5 mL 0   Multiple Vitamin (MULTIVITAMIN WITH MINERALS) TABS tablet Take 1 tablet by mouth daily.     pantoprazole (PROTONIX) 40 MG tablet TAKE 1 TABLET BY MOUTH ONCE DAILY 90 tablet 1   traMADol (ULTRAM) 50 MG tablet Take 1 tablet (50 mg total) by mouth every 8 (eight) hours as needed. 30 tablet 0   cephALEXin (KEFLEX) 500 MG capsule Take 1 capsule (500 mg total) by mouth 2 (two) times daily. 10 capsule 0   No facility-administered medications prior to visit.    No Known Allergies  Review of Systems  Constitutional:  Negative for chills and fever.  HENT:  Negative for congestion, ear pain, hearing loss,  sinus pain and sore throat.   Eyes:  Negative for blurred vision and pain.  Respiratory:  Negative for cough, sputum production, shortness of breath and wheezing.   Cardiovascular:  Negative for chest pain and palpitations.  Gastrointestinal:  Positive for nausea. Negative for blood in stool, constipation, diarrhea and vomiting.  Genitourinary:  Positive for dysuria. Negative for frequency, hematuria and urgency.  Musculoskeletal:  Negative for back pain, falls and myalgias.  Skin:  Positive for itching and rash.  Neurological:  Positive for dizziness. Negative for sensory change, loss of consciousness, weakness and headaches.  Endo/Heme/Allergies:  Negative for environmental allergies. Does not bruise/bleed easily.  Psychiatric/Behavioral:  Negative for depression and suicidal ideas. The patient is not nervous/anxious and does not have insomnia.       Objective:    Physical Exam Vitals and nursing note reviewed.  Constitutional:      General: She is not in acute distress.    Appearance: Normal appearance. She is not ill-appearing.  HENT:     Head: Normocephalic and atraumatic.     Right Ear: External ear normal.     Left Ear: External ear normal.  Eyes:     Extraocular Movements: Extraocular movements intact.     Pupils: Pupils are equal, round, and reactive to light.  Cardiovascular:     Rate and Rhythm: Normal rate and regular rhythm.     Pulses: Normal pulses.     Heart sounds: Normal heart sounds. No murmur heard.   No gallop.  Pulmonary:     Effort: Pulmonary effort is normal. No respiratory distress.     Breath sounds: Normal breath sounds. No wheezing, rhonchi or rales.  Abdominal:     General: Bowel sounds are normal. There is no distension.     Palpations: Abdomen is soft. There is no mass.     Tenderness: There is no abdominal tenderness. There is no guarding or rebound.     Hernia: No hernia is present.  Genitourinary:    Labia:        Right: Rash present.    Musculoskeletal:     Cervical back: Normal range of motion and neck supple.  Lymphadenopathy:     Cervical: No cervical adenopathy.  Skin:    General: Skin is warm and dry.  Neurological:     Mental Status: She is alert and oriented to person, place, and time.  Psychiatric:        Mood and Affect: Mood normal.        Behavior: Behavior normal.        Thought Content: Thought content normal.        Judgment: Judgment normal.    BP (!) 140/100 (BP Location: Left Arm, Patient Position: Sitting, Cuff Size: Large)   Pulse 81   Temp 98.2 F (36.8 C) (Oral)   Resp 18   Ht 5\' 4"  (1.626 m)   Wt 268 lb 3.2 oz (121.7 kg)   SpO2 96%   BMI 46.04 kg/m  Wt Readings from Last 3 Encounters:  10/01/21 268 lb 3.2 oz (121.7 kg)  09/21/21 263 lb (119.3 kg)  09/06/21 266 lb 4 oz (120.8 kg)    Diabetic Foot Exam - Simple   No data filed    Lab Results  Component Value Date   WBC 6.4 10/27/2020   HGB 14.3 10/27/2020   HCT 42.2 10/27/2020   PLT 171 10/27/2020   GLUCOSE 111 (H) 06/26/2021   CHOL 190 07/20/2019   TRIG 105 10/08/2020   HDL 50.10 07/20/2019   LDLDIRECT 111.0 07/20/2019   LDLCALC 93 06/10/2018   ALT 39 (H) 03/09/2021   AST 29 03/09/2021   NA 141 06/26/2021  K 4.4 06/26/2021   CL 105 06/26/2021   CREATININE 1.08 06/26/2021   BUN 21 06/26/2021   CO2 29 06/26/2021   TSH 5.65 (H) 04/10/2021   HGBA1C 6.5 06/26/2021   MICROALBUR <0.7 06/10/2018    Lab Results  Component Value Date   TSH 5.65 (H) 04/10/2021   Lab Results  Component Value Date   WBC 6.4 10/27/2020   HGB 14.3 10/27/2020   HCT 42.2 10/27/2020   MCV 93.6 10/27/2020   PLT 171 10/27/2020   Lab Results  Component Value Date   NA 141 06/26/2021   K 4.4 06/26/2021   CO2 29 06/26/2021   GLUCOSE 111 (H) 06/26/2021   BUN 21 06/26/2021   CREATININE 1.08 06/26/2021   BILITOT 0.4 03/09/2021   ALKPHOS 91 03/09/2021   AST 29 03/09/2021   ALT 39 (H) 03/09/2021   PROT 6.9 03/09/2021   ALBUMIN 4.5  03/09/2021   CALCIUM 9.4 06/26/2021   ANIONGAP 9 10/13/2020   GFR 51.95 (L) 06/26/2021   Lab Results  Component Value Date   CHOL 190 07/20/2019   Lab Results  Component Value Date   HDL 50.10 07/20/2019   Lab Results  Component Value Date   LDLCALC 93 06/10/2018   Lab Results  Component Value Date   TRIG 105 10/08/2020   Lab Results  Component Value Date   CHOLHDL 4 07/20/2019   Lab Results  Component Value Date   HGBA1C 6.5 06/26/2021       Assessment & Plan:   Problem List Items Addressed This Visit   None Visit Diagnoses     Dysuria    -  Primary   Relevant Medications   nitrofurantoin, macrocrystal-monohydrate, (MACROBID) 100 MG capsule   fluconazole (DIFLUCAN) 150 MG tablet   Other Relevant Orders   POCT Urinalysis Dipstick (Automated) (Completed)   Urine Culture   Tinea corporis       Relevant Medications   nitrofurantoin, macrocrystal-monohydrate, (MACROBID) 100 MG capsule   fluconazole (DIFLUCAN) 150 MG tablet   nystatin powder     If symptoms do not improve f/u pcp for pelvic exam   Meds ordered this encounter  Medications   nitrofurantoin, macrocrystal-monohydrate, (MACROBID) 100 MG capsule    Sig: Take 1 capsule (100 mg total) by mouth 2 (two) times daily.    Dispense:  14 capsule    Refill:  0   fluconazole (DIFLUCAN) 150 MG tablet    Sig: Take 1 tablet by mouth once. May repeat in 3 days as needed.    Dispense:  2 tablet    Refill:  0   nystatin powder    Sig: Apply 1 application on to the skin 3 (three) times daily.    Dispense:  15 g    Refill:  0    I,Zite Okoli,acting as a scribe for [employer], DO.,have documented all relevant documentation on the behalf of Ann Held, DO,as directed by  Ann Held, DO while in the presence of Ann Held, DO.   I, Ann Held, DO., personally preformed the services described in this documentation.  All medical record entries made by the scribe  were at my direction and in my presence.  I have reviewed the chart and discharge instructions (if applicable) and agree that the record reflects my personal performance and is accurate and complete. 10/01/2021

## 2021-10-02 LAB — URINE CULTURE
MICRO NUMBER:: 12481887
SPECIMEN QUALITY:: ADEQUATE

## 2021-10-03 LAB — CERVICOVAGINAL ANCILLARY ONLY
Bacterial Vaginitis (gardnerella): NEGATIVE
Candida Glabrata: NEGATIVE
Candida Vaginitis: NEGATIVE
Chlamydia: NEGATIVE
Comment: NEGATIVE
Comment: NEGATIVE
Comment: NEGATIVE
Comment: NEGATIVE
Comment: NEGATIVE
Comment: NORMAL
Neisseria Gonorrhea: NEGATIVE
Trichomonas: NEGATIVE

## 2021-10-16 ENCOUNTER — Other Ambulatory Visit: Payer: Self-pay | Admitting: Family

## 2021-10-16 ENCOUNTER — Other Ambulatory Visit (HOSPITAL_BASED_OUTPATIENT_CLINIC_OR_DEPARTMENT_OTHER): Payer: Self-pay

## 2021-10-16 MED ORDER — PANTOPRAZOLE SODIUM 40 MG PO TBEC
DELAYED_RELEASE_TABLET | Freq: Every day | ORAL | 1 refills | Status: DC
  Filled 2021-10-16: qty 90, fill #0
  Filled 2021-10-18: qty 90, 90d supply, fill #0
  Filled 2022-01-11: qty 90, 90d supply, fill #1

## 2021-10-18 ENCOUNTER — Other Ambulatory Visit (HOSPITAL_BASED_OUTPATIENT_CLINIC_OR_DEPARTMENT_OTHER): Payer: Self-pay

## 2021-11-01 ENCOUNTER — Other Ambulatory Visit (HOSPITAL_BASED_OUTPATIENT_CLINIC_OR_DEPARTMENT_OTHER): Payer: Self-pay

## 2021-11-01 ENCOUNTER — Encounter: Payer: Self-pay | Admitting: Family

## 2021-11-01 MED ORDER — SHINGRIX 50 MCG/0.5ML IM SUSR
INTRAMUSCULAR | 0 refills | Status: DC
  Filled 2021-11-01: qty 0.5, 1d supply, fill #0

## 2021-11-02 ENCOUNTER — Telehealth: Payer: Self-pay

## 2021-11-02 NOTE — Telephone Encounter (Signed)
Spoke to patient and offered her an appointment. She reports the pain she is having is different than the one she had before when she saw Dr. Erlinda Hong but she called his office and lvm. She prefers to wait and see if they will call her. I advised her to call us back if she changes her mind about the appointment.   I also told her we will start her handicap sicker application.

## 2021-11-02 NOTE — Telephone Encounter (Signed)
No notes

## 2021-11-05 NOTE — Telephone Encounter (Signed)
Patient will get form when she comes in for f/up on friday

## 2021-11-05 NOTE — Telephone Encounter (Signed)
Can we please mail her a copy of her handicap placard form?

## 2021-11-07 ENCOUNTER — Other Ambulatory Visit: Payer: Self-pay | Admitting: Family

## 2021-11-08 ENCOUNTER — Other Ambulatory Visit (HOSPITAL_BASED_OUTPATIENT_CLINIC_OR_DEPARTMENT_OTHER): Payer: Self-pay

## 2021-11-08 MED ORDER — HYDROCHLOROTHIAZIDE 25 MG PO TABS
25.0000 mg | ORAL_TABLET | Freq: Every day | ORAL | 0 refills | Status: DC
  Filled 2021-11-08: qty 90, 90d supply, fill #0

## 2021-11-08 MED ORDER — DULOXETINE HCL 60 MG PO CPEP
60.0000 mg | ORAL_CAPSULE | Freq: Every day | ORAL | 0 refills | Status: DC
  Filled 2021-11-08: qty 90, 90d supply, fill #0

## 2021-11-08 MED ORDER — ALLOPURINOL 100 MG PO TABS
200.0000 mg | ORAL_TABLET | Freq: Every day | ORAL | 0 refills | Status: DC
  Filled 2021-11-08: qty 180, 90d supply, fill #0

## 2021-11-08 NOTE — Telephone Encounter (Signed)
Refill request for tramadol 50mg- no longer on med list. Please advise?  

## 2021-11-09 ENCOUNTER — Ambulatory Visit (INDEPENDENT_AMBULATORY_CARE_PROVIDER_SITE_OTHER): Payer: PPO | Admitting: Family

## 2021-11-09 ENCOUNTER — Other Ambulatory Visit (HOSPITAL_BASED_OUTPATIENT_CLINIC_OR_DEPARTMENT_OTHER): Payer: Self-pay

## 2021-11-09 ENCOUNTER — Ambulatory Visit (HOSPITAL_BASED_OUTPATIENT_CLINIC_OR_DEPARTMENT_OTHER)
Admission: RE | Admit: 2021-11-09 | Discharge: 2021-11-09 | Disposition: A | Discharge status: Home / Self Care | Payer: PPO | Source: Ambulatory Visit | Attending: Family | Admitting: Family

## 2021-11-09 ENCOUNTER — Other Ambulatory Visit: Payer: Self-pay

## 2021-11-09 VITALS — BP 162/88 | HR 82 | Temp 98.0°F | Resp 16 | Ht 64.0 in | Wt 266.0 lb

## 2021-11-09 DIAGNOSIS — I10 Essential (primary) hypertension: Secondary | ICD-10-CM

## 2021-11-09 DIAGNOSIS — M545 Low back pain, unspecified: Secondary | ICD-10-CM | POA: Diagnosis not present

## 2021-11-09 DIAGNOSIS — M5416 Radiculopathy, lumbar region: Secondary | ICD-10-CM | POA: Insufficient documentation

## 2021-11-09 DIAGNOSIS — M4316 Spondylolisthesis, lumbar region: Secondary | ICD-10-CM | POA: Diagnosis not present

## 2021-11-09 DIAGNOSIS — M5412 Radiculopathy, cervical region: Secondary | ICD-10-CM

## 2021-11-09 DIAGNOSIS — M47812 Spondylosis without myelopathy or radiculopathy, cervical region: Secondary | ICD-10-CM | POA: Diagnosis not present

## 2021-11-09 DIAGNOSIS — M4802 Spinal stenosis, cervical region: Secondary | ICD-10-CM | POA: Diagnosis not present

## 2021-11-09 MED ORDER — METHYLPREDNISOLONE 4 MG PO TBPK
ORAL_TABLET | ORAL | 0 refills | Status: DC
  Filled 2021-11-09: qty 21, 6d supply, fill #0

## 2021-11-09 MED ORDER — TRAMADOL HCL 50 MG PO TABS
50.0000 mg | ORAL_TABLET | Freq: Three times a day (TID) | ORAL | 0 refills | Status: DC | PRN
  Filled 2021-11-09: qty 30, 10d supply, fill #0

## 2021-11-09 NOTE — Assessment & Plan Note (Signed)
BP is elevated today.  She is in a good bit of pain. Plan to recheck at her follow up in 1 month.

## 2021-11-09 NOTE — Progress Notes (Signed)
Subjective:     Patient ID: Jordan [name], female    DOB: [birth year], 70 y.o.   MRN: [id number]  Chief Complaint  Patient presents with   Hip Pain    Complains of left hip pain radiating down leg up to knee    Hip Pain    Reports that she has pain from left hip radiating down to the left knee. This pain began about 2 weeks ago.  She has tried tramadol prn pain.  She reports that she has been having numbness, tingling and burning down both legs.  This discomfort has been present for 6 months. Reports that her left sided hip pain is severe.  She started using her cane again which has helped some.    Reports a tingling and crawling pain in the left arm as well as left sided neck pain.  Health Maintenance Due  Topic Date Due   OPHTHALMOLOGY EXAM  08/23/2016   COVID-19 Vaccine (3 - Pfizer risk series) 09/13/2020   INFLUENZA VACCINE  07/23/2021    Past Medical History:  Diagnosis Date   Cellulitis and abscess of finger, unspecified 401.9   Cellulitis and abscess of foot, except toes    Depressive disorder, not elsewhere classified    GERD (gastroesophageal reflux disease)    Gout, unspecified    History of tobacco abuse 12/01/2018   Hyperglycemia 05/08/2011   Hyperlipidemia    borderline- not on meds   Hypertension    Insomnia    Leg edema    chronic   Obesity, unspecified    Osteoarthritis    Renal insufficiency, mild 05/08/2011   Small bowel obstruction ([place])     Past Surgical History:  Procedure Laterality Date   ABDOMINAL HYSTERECTOMY  2002   infection- no history of cancer   APPENDECTOMY  2002   catacact Right 11/01/2019   CHOLECYSTECTOMY  1977   ECTOPIC PREGNANCY SURGERY  1971   right shoulder rotator cuff repair  06-07-10   TOTAL HIP ARTHROPLASTY Left 1998    Family History  Problem Relation Age of Onset   Diabetes Mother    Cancer Mother        colon/ pancreatic   Colon cancer Mother 48   Hypertension Father    Alcohol abuse Father    Cirrhosis  Father    Other Sister        Pyeoderma gangrenosis   Diabetes Sister        type 2   Kidney failure Sister    Liver disease Sister    Cirrhosis Sister        had NASH, died heart failure   Heart attack Brother    Stomach cancer Maternal Grandfather    Obesity Daughter    Rectal cancer Neg Hx    Esophageal cancer Neg Hx     Social History   Socioeconomic History   Marital status: Divorced    Spouse name: Not on file   Number of children: 2   Years of education: Not on file   Highest education level: Not on file  Occupational History   Occupation: works prn at [employer]    Employer: [employer]  Tobacco Use   Smoking status: Former    Packs/day: 0.25    Types: Cigarettes    Quit date: 09/25/2020    Years since quitting: 1.1   Smokeless tobacco: Never  Vaping Use   Vaping Use: Never used  Substance and Sexual Activity   Alcohol use: Yes  Alcohol/week: 0.0 standard drinks    Comment: rare   Drug use: No   Sexual activity: Not Currently  Other Topics Concern   Not on file  Social History Narrative   2 children (daughter and son) both local. 3 grandchildren, one great grandchild   Works at ED front desk   Divorced (married x 30 years)   Social Determinants of Radio broadcast assistant Strain: Low Risk    Difficulty of Paying Living Expenses: Not hard at all  Food Insecurity: No Food Insecurity   Worried About Charity fundraiser in the Last Year: Never true   Arboriculturist in the Last Year: Never true  Transportation Needs: No Transportation Needs   Lack of Transportation (Medical): No   Lack of Transportation (Non-Medical): No  Physical Activity: Insufficiently Active   Days of Exercise per Week: 3 days   Minutes of Exercise per Session: 30 min  Stress: No Stress Concern Present   Feeling of Stress : Not at all  Social Connections: Moderately Isolated   Frequency of Communication with Friends and Family: More than three times a week   Frequency of Social  Gatherings with Friends and Family: More than three times a week   Attends Religious Services: More than 4 times per year   Active Member of [employer] or Organizations: No   Attends Archivist Meetings: Never   Marital Status: Divorced  Human resources officer Violence: Not At Risk   Fear of Current or Ex-Partner: No   Emotionally Abused: No   Physically Abused: No   Sexually Abused: No    Outpatient Medications Prior to Visit  Medication Sig Dispense Refill   allopurinol (ZYLOPRIM) 100 MG tablet Take 2 tablets (200 mg total) by mouth daily. 180 tablet 0   aspirin EC 81 MG tablet Take 81 mg by mouth daily.     Cholecalciferol 25 MCG (1000 UT) capsule Take 1,000 Units by mouth daily.     DULoxetine (CYMBALTA) 60 MG capsule Take 1 capsule (60 mg total) by mouth daily. 90 capsule 0   furosemide (LASIX) 40 MG tablet TAKE 1 TABLET BY MOUTH ONCE DAILY AS NEEDED 90 tablet 1   hydrochlorothiazide (HYDRODIURIL) 25 MG tablet Take 1 tablet (25 mg total) by mouth daily. 90 tablet 0   influenza vaccine adjuvanted (FLUAD QUADRIVALENT) 0.5 ML injection Inject into the muscle. 0.5 mL 0   Multiple Vitamin (MULTIVITAMIN WITH MINERALS) TABS tablet Take 1 tablet by mouth daily.     nystatin powder Apply 1 application on to the skin 3 (three) times daily. 15 g 0   pantoprazole (PROTONIX) 40 MG tablet TAKE 1 TABLET BY MOUTH ONCE DAILY 90 tablet 1   Zoster Vaccine Adjuvanted [facility]) injection Inject into the muscle. 0.5 mL 0   fluconazole (DIFLUCAN) 150 MG tablet Take 1 tablet by mouth once. May repeat in 3 days as needed. 2 tablet 0   nitrofurantoin, macrocrystal-monohydrate, (MACROBID) 100 MG capsule Take 1 capsule (100 mg total) by mouth 2 (two) times daily. 14 capsule 0   No facility-administered medications prior to visit.    No Known Allergies  ROS See HPI    Objective:    Physical Exam Constitutional:      Appearance: Normal appearance.  HENT:     Head: Normocephalic and atraumatic.   Cardiovascular:     Rate and Rhythm: Normal rate.  Pulmonary:     Effort: Pulmonary effort is normal.  Skin:    General:  Skin is warm and dry.  Neurological:     Mental Status: She is alert and oriented to person, place, and time.     Comments: Unable to feel monofilament bilateral upper anterior thighs Decreased sensation left dorsal foot Bilateral UE/LE strength is 5/5 LUE sensation is intact  Psychiatric:        Attention and Perception: Attention and perception normal.        Mood and Affect: Mood and affect normal.        Speech: Speech normal.        Behavior: Behavior normal.    BP (!) 162/88 (BP Location: Left Arm, Patient Position: Sitting, Cuff Size: Large)   Pulse 82   Temp 98 F (36.7 C) (Oral)   Resp 16   Ht 5\' 4"  (1.626 m)   Wt 266 lb (120.7 kg)   SpO2 98%   BMI 45.66 kg/m  Wt Readings from Last 3 Encounters:  11/09/21 266 lb (120.7 kg)  10/01/21 268 lb 3.2 oz (121.7 kg)  09/21/21 263 lb (119.3 kg)       Assessment & Plan:   Problem List Items Addressed This Visit       Unprioritized   Lumbar radiculopathy - Primary    Uncontrolled.  Having severe pain.  Continue tramadol prn pain. Will also rx with medrol dose pak and obtain X-ray of lumbar spine for further evaluation.       Relevant Orders   DG Lumbar Spine Complete   Essential hypertension    BP is elevated today.  She is in a good bit of pain. Plan to recheck at her follow up in 1 month.       Cervical radiculopathy    Uncontrolled. Can't take NSAIDS- continue tramadol prn pain. Will also rx with medrol dose pak and obtain X-ray of C spine for further evaluation.       Relevant Orders   DG Cervical Spine Complete    I have discontinued Diego Cory. Krupinski's nitrofurantoin (macrocrystal-monohydrate) and fluconazole. I am also having her start on methylPREDNISolone. Additionally, I am having her maintain her aspirin EC, multivitamin with minerals, Cholecalciferol, furosemide, Fluad  Quadrivalent, nystatin, pantoprazole, Shingrix, hydrochlorothiazide, allopurinol, and DULoxetine.  Meds ordered this encounter  Medications   methylPREDNISolone (MEDROL DOSEPAK) 4 MG TBPK tablet    Sig: Take 6 tablets by mouth on day 1. 5 tablets on day 2, 4 tablets on day 3, 3 tablets on day 4, 2 tablets on day 5, and 1 tablet on day 6    Dispense:  21 tablet    Refill:  0    Order Specific Question:   Supervising Provider    Answer:   Penni Homans A [[id number]]

## 2021-11-09 NOTE — Assessment & Plan Note (Signed)
Uncontrolled. Can't take NSAIDS- continue tramadol prn pain. Will also rx with medrol dose pak and obtain X-ray of C spine for further evaluation.

## 2021-11-09 NOTE — Assessment & Plan Note (Signed)
Uncontrolled.  Having severe pain.  Continue tramadol prn pain. Will also rx with medrol dose pak and obtain X-ray of lumbar spine for further evaluation.

## 2021-11-09 NOTE — Patient Instructions (Signed)
Start medrol dose pak.  Complete x-rays on the first floor.

## 2021-11-13 ENCOUNTER — Telehealth: Payer: Self-pay | Admitting: Family

## 2021-11-13 DIAGNOSIS — M5412 Radiculopathy, cervical region: Secondary | ICD-10-CM

## 2021-11-13 DIAGNOSIS — M5416 Radiculopathy, lumbar region: Secondary | ICD-10-CM

## 2021-11-13 NOTE — Telephone Encounter (Signed)
See order(s).

## 2021-11-24 ENCOUNTER — Ambulatory Visit (HOSPITAL_BASED_OUTPATIENT_CLINIC_OR_DEPARTMENT_OTHER)
Admission: RE | Admit: 2021-11-24 | Discharge: 2021-11-24 | Disposition: A | Discharge status: Home / Self Care | Payer: PPO | Source: Ambulatory Visit | Attending: Family | Admitting: Family

## 2021-11-24 ENCOUNTER — Other Ambulatory Visit: Payer: Self-pay

## 2021-11-24 DIAGNOSIS — M542 Cervicalgia: Secondary | ICD-10-CM | POA: Diagnosis not present

## 2021-11-24 DIAGNOSIS — M5416 Radiculopathy, lumbar region: Secondary | ICD-10-CM | POA: Insufficient documentation

## 2021-11-24 DIAGNOSIS — R202 Paresthesia of skin: Secondary | ICD-10-CM | POA: Diagnosis not present

## 2021-11-24 DIAGNOSIS — M545 Low back pain, unspecified: Secondary | ICD-10-CM | POA: Diagnosis not present

## 2021-11-24 DIAGNOSIS — M5412 Radiculopathy, cervical region: Secondary | ICD-10-CM | POA: Diagnosis not present

## 2021-11-26 ENCOUNTER — Telehealth: Payer: Self-pay | Admitting: Family

## 2021-11-26 DIAGNOSIS — M5030 Other cervical disc degeneration, unspecified cervical region: Secondary | ICD-10-CM

## 2021-11-26 DIAGNOSIS — M5136 Other intervertebral disc degeneration, lumbar region: Secondary | ICD-10-CM

## 2021-11-26 DIAGNOSIS — G9520 Unspecified cord compression: Secondary | ICD-10-CM

## 2021-11-26 NOTE — Telephone Encounter (Signed)
Lvm for patient to call back for radiology results.

## 2021-11-26 NOTE — Telephone Encounter (Signed)
Please contact pt and let her know that I reviewed her MRI's of her neck and lower back.  MRI's show that she has a lot of arthritis/disc issues in neck and back as well as some pinched nerves in her lower spine. I would recommend that she meet with neurosurgeon to further discuss.

## 2021-11-27 NOTE — Telephone Encounter (Signed)
Patient advised of results and referral. She verbalized understanding.  

## 2021-12-07 ENCOUNTER — Ambulatory Visit (INDEPENDENT_AMBULATORY_CARE_PROVIDER_SITE_OTHER): Payer: PPO | Admitting: Family

## 2021-12-07 DIAGNOSIS — G894 Chronic pain syndrome: Secondary | ICD-10-CM

## 2021-12-07 DIAGNOSIS — I10 Essential (primary) hypertension: Secondary | ICD-10-CM | POA: Diagnosis not present

## 2021-12-07 NOTE — Progress Notes (Signed)
Subjective:   By signing my name below, I, Jordan [name], attest that this documentation has been prepared under the direction and in the presence of [name], NP, 12/07/2021   Patient ID: Jordan [name], female    DOB: 05-Nov-1951, 70 y.o.   MRN: [id number]  Chief Complaint  Patient presents with   Hypertension    Here for follow up   Lumbar radiculopathy    Follow up    HPI Patient is in today for an office visit.  Blood pressure: Her blood pressure is within good range today. She is compliant in taking 25 mg hydrochlorothiazide and has no concerns with this at this time.  BP Readings from Last 3 Encounters:  12/07/21 136/66  11/09/21 (!) 162/88  10/01/21 (!) 140/100  Previous pain: She was experiencing pain in her back and neck to the point that she was suggested to get an MRI of her c spine and lumbar spine.  She was prescribed a methylprednisolone pack and reports that when she laid down one night and positioned herself the pain relieved. She experiences relief in certain body positions. She remarks that the back pain has resolved. She has an appointment with a neurosurgeon on 01/23/2022 to have this further investigated.   MRI results were as follows:    IMPRESSION: 1. Cervical spine degeneration with generalized progression since 2007. Mild C3-4 anterolisthesis. 2. Marked active facet arthritis on the left at C2-3. Discogenic edema leftward at C4-5. 3. C4-5 left paracentral to foraminal herniation flattening the left cord and causing advanced left foraminal stenosis. 4. C5-6 left foraminal impingement. 5. C6-7 central herniation indenting the ventral cord.  IMPRESSION: 1. Multilevel spinal degeneration with hyperlordosis, scoliosis, and L4-5, L5-S1 anterolisthesis. 2. Compressive spinal stenosis at L2-3 and L4-5. 3. Right-sided foraminal impingement at L1-2, L2-3 and L4-5. Left foraminal impingement at T12-L1 and especially L4-5.      Health  Maintenance Due  Topic Date Due   OPHTHALMOLOGY EXAM  08/23/2016   COVID-19 Vaccine (3 - Pfizer risk series) 09/13/2020    Past Medical History:  Diagnosis Date   Cellulitis and abscess of finger, unspecified 401.9   Cellulitis and abscess of foot, except toes    Depressive disorder, not elsewhere classified    GERD (gastroesophageal reflux disease)    Gout, unspecified    History of tobacco abuse 12/01/2018   Hyperglycemia 05/08/2011   Hyperlipidemia    borderline- not on meds   Hypertension    Insomnia    Leg edema    chronic   Obesity, unspecified    Osteoarthritis    Renal insufficiency, mild 05/08/2011   Small bowel obstruction ([place])     Past Surgical History:  Procedure Laterality Date   ABDOMINAL HYSTERECTOMY  2002   infection- no history of cancer   APPENDECTOMY  2002   catacact Right 11/01/2019   [place]   right shoulder rotator cuff repair  06-07-10   TOTAL HIP ARTHROPLASTY Left 1998    Family History  Problem Relation Age of Onset   Diabetes Mother    Cancer Mother        colon/ pancreatic   Colon cancer Mother 23   Hypertension Father    Alcohol abuse Father    Cirrhosis Father    Other Sister        Pyeoderma gangrenosis   Diabetes Sister        type 2   Kidney failure Sister  Liver disease Sister    Cirrhosis Sister        had NASH, died heart failure   Heart attack Brother    Stomach cancer Maternal Grandfather    Obesity Daughter    Rectal cancer Neg Hx    Esophageal cancer Neg Hx     Social History   Socioeconomic History   Marital status: Divorced    Spouse name: Not on file   Number of children: 2   Years of education: Not on file   Highest education level: Not on file  Occupational History   Occupation: works prn at [employer]    Employer: [place] [place]  Tobacco Use   Smoking status: Former    Packs/day: 0.25    Types: Cigarettes    Quit date: 09/25/2020    Years since quitting: 1.2    Smokeless tobacco: Never  Vaping Use   Vaping Use: Never used  Substance and Sexual Activity   Alcohol use: Yes    Alcohol/week: 0.0 standard drinks    Comment: rare   Drug use: No   Sexual activity: Not Currently  Other Topics Concern   Not on file  Social History Narrative   2 children (daughter and son) both local. 3 grandchildren, one great grandchild   Works at ED front desk   Divorced (married x 30 years)   Social Determinants of Radio broadcast assistant Strain: Low Risk    Difficulty of Paying Living Expenses: Not hard at all  Food Insecurity: No Food Insecurity   Worried About Charity fundraiser in the Last Year: Never true   Arboriculturist in the Last Year: Never true  Transportation Needs: No Transportation Needs   Lack of Transportation (Medical): No   Lack of Transportation (Non-Medical): No  Physical Activity: Insufficiently Active   Days of Exercise per Week: 3 days   Minutes of Exercise per Session: 30 min  Stress: No Stress Concern Present   Feeling of Stress : Not at all  Social Connections: Moderately Isolated   Frequency of Communication with Friends and Family: More than three times a week   Frequency of Social Gatherings with Friends and Family: More than three times a week   Attends Religious Services: More than 4 times per year   Active Member of [employer] or Organizations: No   Attends Archivist Meetings: Never   Marital Status: Divorced  Human resources officer Violence: Not At Risk   Fear of Current or Ex-Partner: No   Emotionally Abused: No   Physically Abused: No   Sexually Abused: No    Outpatient Medications Prior to Visit  Medication Sig Dispense Refill   allopurinol (ZYLOPRIM) 100 MG tablet Take 2 tablets (200 mg total) by mouth daily. 180 tablet 0   aspirin EC 81 MG tablet Take 81 mg by mouth daily.     Cholecalciferol 25 MCG (1000 UT) capsule Take 1,000 Units by mouth daily.     DULoxetine (CYMBALTA) 60 MG capsule Take 1  capsule (60 mg total) by mouth daily. 90 capsule 0   furosemide (LASIX) 40 MG tablet TAKE 1 TABLET BY MOUTH ONCE DAILY AS NEEDED 90 tablet 1   hydrochlorothiazide (HYDRODIURIL) 25 MG tablet Take 1 tablet (25 mg total) by mouth daily. 90 tablet 0   influenza vaccine adjuvanted (FLUAD QUADRIVALENT) 0.5 ML injection Inject into the muscle. 0.5 mL 0   methylPREDNISolone (MEDROL DOSEPAK) 4 MG TBPK tablet Take 6 tablets by mouth on day  1. 5 tablets on day 2, 4 tablets on day 3, 3 tablets on day 4, 2 tablets on day 5, and 1 tablet on day 6 21 tablet 0   Multiple Vitamin (MULTIVITAMIN WITH MINERALS) TABS tablet Take 1 tablet by mouth daily.     nystatin powder Apply 1 application on to the skin 3 (three) times daily. 15 g 0   pantoprazole (PROTONIX) 40 MG tablet TAKE 1 TABLET BY MOUTH ONCE DAILY 90 tablet 1   traMADol (ULTRAM) 50 MG tablet Take 1 tablet (50 mg total) by mouth every 8 (eight) hours as needed. 30 tablet 0   Zoster Vaccine Adjuvanted [facility]) injection Inject into the muscle. 0.5 mL 0   No facility-administered medications prior to visit.    No Known Allergies  Review of Systems  Musculoskeletal:  Positive for neck pain (improved but not resolved). Negative for back pain (resolved since last treatment).  Neurological:  Positive for tingling (associated with the neck pain).      Objective:    Physical Exam Constitutional:      General: She is not in acute distress.    Appearance: Normal appearance. She is not ill-appearing.  HENT:     Head: Normocephalic and atraumatic.     Right Ear: External ear normal.     Left Ear: External ear normal.  Eyes:     Extraocular Movements: Extraocular movements intact.     Pupils: Pupils are equal, round, and reactive to light.  Cardiovascular:     Rate and Rhythm: Normal rate and regular rhythm.     Heart sounds: Normal heart sounds. No murmur heard.   No gallop.  Pulmonary:     Effort: Pulmonary effort is normal. No respiratory  distress.     Breath sounds: Normal breath sounds. No wheezing or rales.  Skin:    General: Skin is warm and dry.  Neurological:     Mental Status: She is alert and oriented to person, place, and time.  Psychiatric:        Behavior: Behavior normal.        Judgment: Judgment normal.    BP 136/66 (BP Location: Right Arm, Patient Position: Sitting, Cuff Size: Large)    Pulse 83    Temp 98.4 F (36.9 C) (Oral)    Resp 16    Ht 5\' 4"  (1.626 m)    Wt 267 lb 3.2 oz (121.2 kg)    SpO2 96%    BMI 45.86 kg/m  Wt Readings from Last 3 Encounters:  12/07/21 267 lb 3.2 oz (121.2 kg)  11/09/21 266 lb (120.7 kg)  10/01/21 268 lb 3.2 oz (121.7 kg)       Assessment & Plan:   Problem List Items Addressed This Visit       Unprioritized   Essential hypertension    BP Readings from Last 3 Encounters:  12/07/21 136/66  11/09/21 (!) 162/88  10/01/21 (!) 140/100  BP looks much better today now that she is no longer in severe pain. Continue HCTZ.       Chronic pain syndrome    She continues to have good pain relief with as needed tramadol and wishes to continue. Controlled substance contract is updated. She is encouraged to keep her upcoming appointment scheduled with neurosurgery.       No orders of the defined types were placed in this encounter.  20 minutes spent on today's visit. Time was spent counseling patient and reviewing records.   I, [name],  NP, personally preformed the services described in this documentation.  All medical record entries made by the scribe were at my direction and in my presence.  I have reviewed the chart and discharge instructions (if applicable) and agree that the record reflects my personal performance and is accurate and complete. 12/07/2021  I,Jordan [name],acting as a Education administrator for Nance Pear, NP.,have documented all relevant documentation on the behalf of Nance Pear, NP,as directed by  Nance Pear, NP while in the  presence of Nance Pear, NP.  Nance Pear, NP

## 2021-12-07 NOTE — Assessment & Plan Note (Addendum)
BP Readings from Last 3 Encounters:  12/07/21 136/66  11/09/21 (!) 162/88  10/01/21 (!) 140/100   BP looks much better today now that she is no longer in severe pain. Continue HCTZ.

## 2021-12-07 NOTE — Patient Instructions (Signed)
Please schedule follow up appointment to have blood pressure rechecked 4 months from now.

## 2021-12-07 NOTE — Assessment & Plan Note (Addendum)
She continues to have good pain relief with as needed tramadol and wishes to continue. Controlled substance contract is updated. She is encouraged to keep her upcoming appointment scheduled with neurosurgery.

## 2021-12-13 ENCOUNTER — Other Ambulatory Visit (HOSPITAL_BASED_OUTPATIENT_CLINIC_OR_DEPARTMENT_OTHER): Payer: Self-pay

## 2021-12-13 ENCOUNTER — Other Ambulatory Visit: Payer: Self-pay | Admitting: Family

## 2021-12-13 MED ORDER — TRAMADOL HCL 50 MG PO TABS
50.0000 mg | ORAL_TABLET | Freq: Three times a day (TID) | ORAL | 0 refills | Status: DC | PRN
  Filled 2021-12-13: qty 30, 10d supply, fill #0

## 2022-01-11 ENCOUNTER — Other Ambulatory Visit (HOSPITAL_BASED_OUTPATIENT_CLINIC_OR_DEPARTMENT_OTHER): Payer: Self-pay

## 2022-01-23 DIAGNOSIS — I10 Essential (primary) hypertension: Secondary | ICD-10-CM | POA: Diagnosis not present

## 2022-01-23 DIAGNOSIS — M4316 Spondylolisthesis, lumbar region: Secondary | ICD-10-CM | POA: Diagnosis not present

## 2022-01-23 DIAGNOSIS — Z6841 Body Mass Index (BMI) 40.0 and over, adult: Secondary | ICD-10-CM | POA: Diagnosis not present

## 2022-01-25 ENCOUNTER — Other Ambulatory Visit (HOSPITAL_BASED_OUTPATIENT_CLINIC_OR_DEPARTMENT_OTHER): Payer: Self-pay

## 2022-01-25 ENCOUNTER — Other Ambulatory Visit: Payer: Self-pay | Admitting: Family

## 2022-01-25 MED ORDER — TRAMADOL HCL 50 MG PO TABS
50.0000 mg | ORAL_TABLET | Freq: Three times a day (TID) | ORAL | 0 refills | Status: DC | PRN
  Filled 2022-01-25: qty 30, 10d supply, fill #0

## 2022-01-29 ENCOUNTER — Ambulatory Visit: Payer: PPO | Attending: Neurological Surgery | Admitting: Physical Therapy

## 2022-01-29 ENCOUNTER — Encounter: Payer: Self-pay | Admitting: Physical Therapy

## 2022-01-29 ENCOUNTER — Other Ambulatory Visit: Payer: Self-pay

## 2022-01-29 DIAGNOSIS — M62830 Muscle spasm of back: Secondary | ICD-10-CM | POA: Diagnosis not present

## 2022-01-29 DIAGNOSIS — M542 Cervicalgia: Secondary | ICD-10-CM | POA: Diagnosis not present

## 2022-01-29 DIAGNOSIS — G8929 Other chronic pain: Secondary | ICD-10-CM | POA: Insufficient documentation

## 2022-01-29 DIAGNOSIS — M5412 Radiculopathy, cervical region: Secondary | ICD-10-CM | POA: Diagnosis not present

## 2022-01-29 DIAGNOSIS — R293 Abnormal posture: Secondary | ICD-10-CM | POA: Insufficient documentation

## 2022-01-29 DIAGNOSIS — M5416 Radiculopathy, lumbar region: Secondary | ICD-10-CM | POA: Diagnosis not present

## 2022-01-29 DIAGNOSIS — M545 Low back pain, unspecified: Secondary | ICD-10-CM | POA: Insufficient documentation

## 2022-01-29 DIAGNOSIS — M6281 Muscle weakness (generalized): Secondary | ICD-10-CM | POA: Insufficient documentation

## 2022-01-29 NOTE — Therapy (Signed)
[facility] [address]  [place], [place], [address] Phone: [phone]   Fax:  [phone]  Physical Therapy Evaluation  Patient Details  Name: Jordan [name] MRN: [id number] Date of Birth: January 11, 1951 Referring Provider (PT): Kristeen Miss, MD   Encounter Date: 01/29/2022   PT End of Session - 01/29/22 0803     Visit Number 1    Number of Visits 12    Date for PT Re-Evaluation 03/12/22    Authorization Type [employer]    PT Start Time 0803    PT Stop Time 0852    PT Time Calculation (min) 49 min    Activity Tolerance Patient tolerated treatment well    Behavior During Therapy [facility] for tasks assessed/performed             Past Medical History:  Diagnosis Date   Cellulitis and abscess of finger, unspecified 401.9   Cellulitis and abscess of foot, except toes    Depressive disorder, not elsewhere classified    GERD (gastroesophageal reflux disease)    Gout, unspecified    History of tobacco abuse 12/01/2018   Hyperglycemia 05/08/2011   Hyperlipidemia    borderline- not on meds   Hypertension    Insomnia    Leg edema    chronic   Obesity, unspecified    Osteoarthritis    Renal insufficiency, mild 05/08/2011   Small bowel obstruction [facility])     Past Surgical History:  Procedure Laterality Date   ABDOMINAL HYSTERECTOMY  2002   infection- no history of cancer   APPENDECTOMY  2002   catacact Right 11/01/2019   CHOLECYSTECTOMY  1977   ECTOPIC PREGNANCY SURGERY  1971   right shoulder rotator cuff repair  06-07-10   TOTAL HIP ARTHROPLASTY Left 1998    There were no vitals filed for this visit.    Subjective Assessment - 01/29/22 0806     Subjective Pt reports MD clains she has a lot going on in her neck and back. He states she needs surgery on her back but gives her a 20% chance that surgery would not work (due to nonunion) as result of her age and weight, hence he wants her to try PT. Pt reports  fear of trying PT due to concern that her pain will increase. Neck pain worsening over the past 6 with reports of cervicogenic dizziness and nausea at times.    Pertinent History L THA 20 yrs ago; R RCR    Limitations Sitting;Standing;Walking;Lifting;House hold activities    Diagnostic tests 11/24/2021  - Lumbar MRI: 1. Multilevel spinal degeneration with hyperlordosis, scoliosis, and  L4-5, L5-S1 anterolisthesis.  2. Compressive spinal stenosis at L2-3 and L4-5.  3. Right-sided foraminal impingement at L1-2, L2-3 and L4-5. Left foraminal impingement at T12-L1 and especially L4-5.   Cervical MRI: 1. Cervical spine degeneration with generalized progression since 2007. Mild C3-4 anterolisthesis.  2. Marked active facet arthritis on the left at C2-3. Discogenic edema leftward at C4-5.  3. C4-5 left paracentral to foraminal herniation flattening the left cord and causing advanced left foraminal stenosis.  4. C5-6 left foraminal impingement.  5. C6-7 central herniation indenting the ventral cord.    Patient Stated Goals "not be in pain and do what I like to do"    Currently in Pain? 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)    Pain Relieving Factors walking with cane or rollator, laying down    Effect of Pain on Daily Activities limits walking tolerance, limits household chores and yardwork - needs lots of sit down breaks    Multiple Pain Sites Yes    Pain Score 0   up to 4-5/10   Pain Location Neck    Pain Orientation Left    Pain Descriptors / Indicators  Numbness;Tingling;Tightness    Pain Type Chronic pain    Pain Radiating Towards numbness on L side of head & tingling down L arm; reports headaches and cervicogenic dizziness/nausea at times    Pain Onset More than a month ago   ~6+ months   Pain Frequency Intermittent    Aggravating Factors  turning head to left, laying on a pillow wrong, sitting w/o support    Pain Relieving Factors repositioning, Aleve    Effect of Pain on Daily Activities has to prop head up when sitting on couch, difficulty looking/bending down                [facility] PT Assessment - 01/29/22 0803       Assessment   Medical Diagnosis 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 1-2    Has the patient had a decrease in activity level because of a fear of falling?  No    Is the patient reluctant to leave their home because of a fear of falling?  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 [employer] - typically 20-36 hrs but sometimes up to 50 hrs a week    Leisure  gardening and yardwork; Cytogeneticist   Overall Cognitive Status Within Functional Limits for tasks assessed      Observation/Other Assessments   Focus on Therapeutic Outcomes (FOTO)  Lumbar = 54 (  risk adjusted = 39); predicted D/C FS = 61      Sensation   Additional Comments numbness in L side of neck with tingling in L UE      Posture/Postural Control   Posture/Postural Control Postural limitations    Postural Limitations Forward head;Rounded Shoulders;Increased thoracic kyphosis;Increased lumbar lordosis    Posture Comments scoliosis      ROM / Strength   AROM / PROM / Strength AROM;Strength      AROM   AROM Assessment Site Cervical;Lumbar    Cervical Flexion 26    Cervical Extension 60    Cervical - Right Side Bend 28    Cervical - Left Side Bend 18   pain   Cervical - Right Rotation 68    Cervical - Left Rotation 54   pain   Lumbar Flexion fingertips to foor    Lumbar Extension 25% limited    Lumbar - Right Side Bend hand to upper calf    Lumbar - Left Side Bend hand to upper calf    Lumbar - Right Rotation 25% limited - mild pain    Lumbar - Left Rotation WFL      Strength   Overall Strength Comments tested in sitting    Strength Assessment Site Shoulder;Hip;Knee;Ankle    Right/Left Shoulder Right;Left    Right Shoulder Flexion 3+/5    Right Shoulder ABduction 3+/5    Right Shoulder Internal Rotation 4+/5    Right Shoulder External Rotation 4/5    Left Shoulder Flexion 4-/5    Left Shoulder ABduction 4-/5    Left Shoulder Internal Rotation 4+/5    Left Shoulder External Rotation 4/5    Right/Left Hip Right;Left    Right Hip Flexion 4/5    Right Hip Extension 4-/5    Right Hip External Rotation  4/5    Right Hip Internal Rotation 4+/5    Right Hip ABduction 4/5    Right Hip ADduction 4/5    Left Hip Flexion 2+/5    Left Hip Extension 4-/5    Left Hip External Rotation 3+/5   limited ROM   Left Hip Internal Rotation 4+/5    Left Hip  ABduction 4-/5    Left Hip ADduction 4-/5    Right/Left Knee Right;Left    Right Knee Flexion 4+/5    Right Knee Extension 4+/5    Left Knee Flexion 4+/5    Left Knee Extension 4+/5    Right/Left Ankle Right;Left    Right Ankle Dorsiflexion 4+/5    Left Ankle Dorsiflexion 4+/5                        Objective measurements completed on examination: See above findings.                  PT Short Term Goals - 01/29/22 0852       PT SHORT TERM GOAL #1   Title Patient will be independent with initial HEP    Status New    Target Date 02/19/22      PT SHORT TERM GOAL #2   Title Patient will verbalize/demonstrate good awareness of neutral spine posture and proper body mechanics for daily tasks    Status New    Target Date 02/19/22               PT Long Term Goals - 01/29/22 0852       PT LONG TERM GOAL #1   Title  Patient will be independent with ongoing/advanced HEP for self-management at home    Status New    Target Date 03/12/22      PT LONG TERM GOAL #2   Title Patient to demonstrate ability to achieve and maintain good spinal alignment/posturing and body mechanics needed for daily activities    Status New    Target Date 03/12/22      PT LONG TERM GOAL #3   Title Patient to report reduction in frequency and intensity of neck and low back pain +/- radiculopathy by >/= 50% to allow for improved activity tolerance    Status New    Target Date 03/12/22      PT LONG TERM GOAL #4   Title Patient to report >/=50% reduction in frequency and intensity of headaches and cervicogenic dizziness/nausea    Status New    Target Date 03/12/22      PT LONG TERM GOAL #5   Title Patient to improve cervical and lumbar AROM to [facility] without pain provocation    Status New    Target Date 03/12/22      PT LONG TERM GOAL #6   Title Patient will demonstrate improved B proximal UE and LE strength to >/= 4/5 for improved stability and ease of mobility as well  as functional UE use    Status New    Target Date 03/12/22      PT LONG TERM GOAL #7   Title Patient to report ability to perform ADLs, household, leisure and work-related tasks without increased neck or low back pain or radiculopathy    Status New    Target Date 03/12/22                    Plan - 01/29/22 0851     Clinical Impression Statement Jordan [name] is a 71 y/o female who presents to OP PT for chronic L-sided low back and neck pain with L lumbar and cervical radiculopathy from lumbar and cervical spondylolisthesis. She reports an acute exacerbation of her LBP w/o known trigger following her MRI back in Dec 2022 with pain so severe it caused her to have to leave work and go home. Cervical pain and radiculopathy has been ongoing for at least 6 months with associated intermittent cervicogenic headaches and dizziness/nausea. Lumbar and cervical MRI performed on 11/24/21 revealing extensive degenerative changes, abnormal alignment, multilevel foraminal impingement and flattening/indention of the cervical cord. She reports MD feels lumbar surgery is indicated but cannot assure positive outcome, therefore referred to PT for trial of conservative management. Pain limits activity tolerance with walking, work tasks and typical daily chores and yardwork requiring more frequent breaks and positional changes. Deficits include L-sided neck and low back pain with constant L UE and intermittent L LE radiculopathy, limited cervical > lumbar ROM, decreased flexibility, postural abnormalities and postural/core along with proximal UE/LE weakness. Jordan [name] will benefit from skilled PT to address above deficits and improve flexibility/ROM along with core/proximal UE/LE strength improve activity tolerance with decreased pain interference. Given h/o falls, she may also benefit from formal balance assessment with goals to be established based on findings once we see how she responds to PT for her neck and back.     Personal Factors and Comorbidities Age;Comorbidity 3+;Fitness;Past/Current Experience;Profession;Time since onset of injury/illness/exacerbation    Comorbidities HTN, OA, L THA, R RCR, GERD, BPPV, obesity, chronic pain syndrome, anxiety, depression    Examination-Activity Limitations Bend;Lift;Carry;Sit;Squat;Stand;Locomotion Level    Examination-Participation Restrictions Cleaning;Community Activity;Laundry;Meal Prep;Occupation;[employer] Work  Stability/Clinical Decision Making Evolving/Moderate complexity    Clinical Decision Making Moderate    Rehab Potential Good    PT Frequency 2x / week    PT Duration 6 weeks    PT Treatment/Interventions ADLs/Self Care Home Management;Cryotherapy;Electrical Stimulation;Iontophoresis 4mg /ml Dexamethasone;Moist Heat;Traction;Gait training;Stair training;Functional mobility training;Therapeutic activities;Therapeutic exercise;Balance training;Neuromuscular re-education;Patient/family education;Manual techniques;Passive range of motion;Dry needling;Taping;Spinal Manipulations    PT Next Visit Plan Assess flexibility & establish initial HEP for stretching, postural and core strengthening/stabilization; posture & body mechanics education    Consulted and Agree with Plan of Care Patient             Patient will benefit from skilled therapeutic intervention in order to improve the following deficits and impairments:  Abnormal gait, Decreased activity tolerance, Decreased balance, Decreased endurance, Decreased knowledge of precautions, Decreased mobility, Decreased range of motion, Decreased safety awareness, Decreased strength, Difficulty walking, Increased fascial restricitons, Increased muscle spasms, Impaired perceived functional ability, Impaired flexibility, Impaired sensation, Impaired UE functional use, Improper body mechanics, Postural dysfunction, Obesity, Pain  Visit Diagnosis: Chronic left-sided low back pain, unspecified whether sciatica  present  Radiculopathy, lumbar region  Cervicalgia  Radiculopathy, cervical region  Abnormal posture  Muscle weakness (generalized)  Muscle spasm of back     Problem List Patient Active Problem List   Diagnosis Date Noted   Lumbar radiculopathy 11/09/2021   Cervical radiculopathy 11/09/2021   COVID-19 virus infection 08/08/2021   Chronic pain syndrome 06/26/2021   Morbidly obese ([facility]) 10/09/2020   Acute hypoxemic respiratory failure due to COVID-19 ([facility]) 10/08/2020   SBO (small bowel obstruction) ([facility]) 12/01/2018   Hypokalemia 12/01/2018   History of tobacco abuse 12/01/2018   Benign paroxysmal positional vertigo 07/25/2015   Preventative health care 07/06/2012   GERD (gastroesophageal reflux disease) 01/03/2012   Hyperglycemia 05/08/2011   General medical examination 05/06/2011   Gout 02/21/2010   Degenerative disc disease, lumbar 07/11/2009   Osteoarthritis 02/19/2009   Hyperlipidemia 01/16/2009   Obesity, Class III, BMI 40-49.9 (morbid obesity) ([facility]) 02/17/2008   Depression 09/04/2007   Essential hypertension 09/04/2007    Percival Spanish, PT 01/29/2022, 11:53 AM  [facility] [address]  [place], [place], [address] Phone: [phone]   Fax:  [phone]  Name: Jordan [name] MRN: [id number] Date of Birth: [birth year]

## 2022-02-01 ENCOUNTER — Ambulatory Visit: Payer: PPO | Admitting: Physical Therapy

## 2022-02-01 ENCOUNTER — Other Ambulatory Visit: Payer: Self-pay

## 2022-02-01 ENCOUNTER — Encounter: Payer: Self-pay | Admitting: Physical Therapy

## 2022-02-01 DIAGNOSIS — M5412 Radiculopathy, cervical region: Secondary | ICD-10-CM

## 2022-02-01 DIAGNOSIS — M542 Cervicalgia: Secondary | ICD-10-CM

## 2022-02-01 DIAGNOSIS — R293 Abnormal posture: Secondary | ICD-10-CM

## 2022-02-01 DIAGNOSIS — M545 Low back pain, unspecified: Secondary | ICD-10-CM

## 2022-02-01 DIAGNOSIS — M6281 Muscle weakness (generalized): Secondary | ICD-10-CM

## 2022-02-01 DIAGNOSIS — M5416 Radiculopathy, lumbar region: Secondary | ICD-10-CM

## 2022-02-01 DIAGNOSIS — M62830 Muscle spasm of back: Secondary | ICD-10-CM

## 2022-02-01 NOTE — Patient Instructions (Addendum)
° ° ° ° ° °  Access Code: [email or web address] URL: [email or web address] Date: 02/01/2022 Prepared by: Annie Paras  Exercises Seated Cervical Retraction - 2 x daily - 7 x weekly - 2 sets - 10 reps - 3-5 sec hold Seated Gentle Upper Trapezius Stretch - 2 x daily - 7 x weekly - 3 reps - 30 sec hold Gentle Levator Scapulae Stretch - 2 x daily - 7 x weekly - 3 reps - 30 sec hold Seated Scapular Retraction - 2 x daily - 7 x weekly - 2 sets - 10 reps - 5 sec hold Hooklying Single Knee to Chest Stretch with Towel - 2 x daily - 7 x weekly - 3 reps - 30 sec hold Supine Piriformis Stretch with Foot on Ground - 2 x daily - 7 x weekly - 3 reps - 30 sec hold Supine Figure 4 Piriformis Stretch - 2 x daily - 7 x weekly - 3 reps - 30 sec hold Supine Lower Trunk Rotation - 2 x daily - 7 x weekly - 5 reps - 10 sec hold Supine Posterior Pelvic Tilt - 2 x daily - 7 x weekly - 2 sets - 10 reps - 5 sec hold Supine March with Posterior Pelvic Tilt - 2 x daily - 7 x weekly - 2 sets - 10 reps - 3 sec hold Bent Knee Fallouts with Alternating Legs - 2 x daily - 7 x weekly - 2 sets - 10 reps - 3 sec hold Seated Flexion Stretch with Swiss Ball - 2 x daily - 7 x weekly - 3 reps - 30 sec hold

## 2022-02-01 NOTE — Therapy (Signed)
[facility] [address]  [place], [place], [address] Phone: [phone]   Fax:  [phone]  Physical Therapy Treatment  Patient Details  Name: Jordan [name] MRN: [id number] Date of Birth: Sep 27, 1951 Referring Provider (PT): Kristeen Miss, MD   Encounter Date: 02/01/2022   PT End of Session - 02/01/22 0931     Visit Number 2    Number of Visits 12    Date for PT Re-Evaluation 03/12/22    Authorization Type [employer]    PT Start Time 0931    PT Stop Time 1015    PT Time Calculation (min) 44 min    Activity Tolerance Patient tolerated treatment well    Behavior During Therapy [facility] for tasks assessed/performed             Past Medical History:  Diagnosis Date   Cellulitis and abscess of finger, unspecified 401.9   Cellulitis and abscess of foot, except toes    Depressive disorder, not elsewhere classified    GERD (gastroesophageal reflux disease)    Gout, unspecified    History of tobacco abuse 12/01/2018   Hyperglycemia 05/08/2011   Hyperlipidemia    borderline- not on meds   Hypertension    Insomnia    Leg edema    chronic   Obesity, unspecified    Osteoarthritis    Renal insufficiency, mild 05/08/2011   Small bowel obstruction [facility])     Past Surgical History:  Procedure Laterality Date   ABDOMINAL HYSTERECTOMY  2002   infection- no history of cancer   APPENDECTOMY  2002   catacact Right 11/01/2019   CHOLECYSTECTOMY  1977   ECTOPIC PREGNANCY SURGERY  1971   right shoulder rotator cuff repair  06-07-10   TOTAL HIP ARTHROPLASTY Left 1998    There were no vitals filed for this visit.   Subjective Assessment - 02/01/22 0934     Subjective Pt denies any pain this morning.    Pertinent History L THA 20 yrs ago; R RCR    Diagnostic tests 11/24/2021  - Lumbar MRI: 1. Multilevel spinal degeneration with hyperlordosis, scoliosis, and  L4-5, L5-S1 anterolisthesis.  2. Compressive spinal stenosis  at L2-3 and L4-5.  3. Right-sided foraminal impingement at L1-2, L2-3 and L4-5. Left foraminal impingement at T12-L1 and especially L4-5.   Cervical MRI: 1. Cervical spine degeneration with generalized progression since 2007. Mild C3-4 anterolisthesis.  2. Marked active facet arthritis on the left at C2-3. Discogenic edema leftward at C4-5.  3. C4-5 left paracentral to foraminal herniation flattening the left cord and causing advanced left foraminal stenosis.  4. C5-6 left foraminal impingement.  5. C6-7 central herniation indenting the ventral cord.    Patient Stated Goals "not be in pain and do what I like to do"    Currently in Pain? No/denies                [facility] PT Assessment - 02/01/22 0931       Flexibility   Soft Tissue Assessment /Muscle Length yes    Hamstrings very mild tight L    ITB mild tight B    Piriformis mod tight L>R                           [facility] Adult PT Treatment/Exercise - 02/01/22 0931       Neck Exercises: Seated   Neck Retraction 10 reps;5 secs  Other Seated Exercise Scap retraction 10 x 5" - cues to avoid shoulder shrug      Neck Exercises: Stretches   Upper Trapezius Stretch Right;Left;1 rep;30 seconds    Levator Stretch Right;Left;1 rep;30 seconds      Lumbar Exercises: Stretches   Single Knee to Chest Stretch Right;Left;1 rep;30 seconds    Single Knee to Chest Stretch Limitations towel assist for ease of reach    Lower Trunk Rotation 5 reps   5-10 sec hold   Piriformis Stretch Right;Left;1 rep;30 seconds    Piriformis Stretch Limitations KTOS - towel assist for ease of reach    Figure 4 Stretch 1 rep;30 seconds;Supine;With overpressure    Other Lumbar Stretch Exercise Seated 3-way trunk flexion/prayer stretch with UE support on cane 3 x 30 sec      Lumbar Exercises: Aerobic   Nustep L3 x 6 min (UE/LE)      Lumbar Exercises: Supine   Pelvic Tilt 10 reps;5 seconds    Pelvic Tilt Limitations PPT    Clam 10 reps;3 seconds     Clam Limitations TrA + bent knee fallout    Bent Knee Raise 10 reps;3 seconds    Bent Knee Raise Limitations TrA/PPT + march                     PT Education - 02/01/22 1015     Education Details Initial HEP - Access Code: [email or web address] including instruction on how to access instructional videos for HEP PRN    Person(s) Educated Patient    Methods Explanation;Demonstration;Verbal cues;Tactile cues;Handout    Comprehension Verbalized understanding;Verbal cues required;Tactile cues required;Returned demonstration;Need further instruction              PT Short Term Goals - 02/01/22 0935       PT SHORT TERM GOAL #1   Title Patient will be independent with initial HEP    Status On-going    Target Date 02/19/22      PT SHORT TERM GOAL #2   Title Patient will verbalize/demonstrate good awareness of neutral spine posture and proper body mechanics for daily tasks    Status On-going    Target Date 02/19/22               PT Long Term Goals - 02/01/22 0935       PT LONG TERM GOAL #1   Title Patient will be independent with ongoing/advanced HEP for self-management at home    Status On-going    Target Date 03/12/22      PT LONG TERM GOAL #2   Title Patient to demonstrate ability to achieve and maintain good spinal alignment/posturing and body mechanics needed for daily activities    Status On-going    Target Date 03/12/22      PT LONG TERM GOAL #3   Title Patient to report reduction in frequency and intensity of neck and low back pain +/- radiculopathy by >/= 50% to allow for improved activity tolerance    Status On-going    Target Date 03/12/22      PT LONG TERM GOAL #4   Title Patient to report >/=50% reduction in frequency and intensity of headaches and cervicogenic dizziness/nausea    Status On-going    Target Date 03/12/22      PT LONG TERM GOAL #5   Title Patient to improve cervical and lumbar AROM to [facility] without pain provocation    Status On-going     Target Date 03/12/22  PT LONG TERM GOAL #6   Title Patient will demonstrate improved B proximal UE and LE strength to >/= 4/5 for improved stability and ease of mobility as well as functional UE use    Status On-going    Target Date 03/12/22      PT LONG TERM GOAL #7   Title Patient to report ability to perform ADLs, household, leisure and work-related tasks without increased neck or low back pain or radiculopathy    Status On-going    Target Date 03/12/22                   Plan - 02/01/22 0935     Clinical Impression Statement Jordan [name] was instructed in basic stretches and postural/core strengthening exercises to improve cervical and lumbar flexibility and muscular support for improved stability and decreased pain. HEP created based on pt response to exercises with pt cautioned to avoid pushing any exercises to level that results in increased pain and to let PT know of any issues with home performance on next visit.    Comorbidities HTN, OA, L THA, R RCR, GERD, BPPV, obesity, chronic pain syndrome, anxiety, depression    Rehab Potential Good    PT Frequency 2x / week    PT Duration 6 weeks    PT Treatment/Interventions ADLs/Self Care Home Management;Cryotherapy;Electrical Stimulation;Iontophoresis 4mg /ml Dexamethasone;Moist Heat;Traction;Gait training;Stair training;Functional mobility training;Therapeutic activities;Therapeutic exercise;Balance training;Neuromuscular re-education;Patient/family education;Manual techniques;Passive range of motion;Dry needling;Taping;Spinal Manipulations    PT Next Visit Plan review initial HEP; progress stretching, postural and core strengthening/stabilization as tolerated; posture & body mechanics education    PT Home Exercise Plan Access Code: [email or web address] (2/10)    Consulted and Agree with Plan of Care Patient             Patient will benefit from skilled therapeutic intervention in order to improve the following deficits and impairments:   Abnormal gait, Decreased activity tolerance, Decreased balance, Decreased endurance, Decreased knowledge of precautions, Decreased mobility, Decreased range of motion, Decreased safety awareness, Decreased strength, Difficulty walking, Increased fascial restricitons, Increased muscle spasms, Impaired perceived functional ability, Impaired flexibility, Impaired sensation, Impaired UE functional use, Improper body mechanics, Postural dysfunction, Obesity, Pain  Visit Diagnosis: Chronic left-sided low back pain, unspecified whether sciatica present  Radiculopathy, lumbar region  Cervicalgia  Radiculopathy, cervical region  Abnormal posture  Muscle weakness (generalized)  Muscle spasm of back     Problem List Patient Active Problem List   Diagnosis Date Noted   Lumbar radiculopathy 11/09/2021   Cervical radiculopathy 11/09/2021   COVID-19 virus infection 08/08/2021   Chronic pain syndrome 06/26/2021   Morbidly obese ([place]) 10/09/2020   Acute hypoxemic respiratory failure due to COVID-19 ([place]) 10/08/2020   SBO (small bowel obstruction) ([facility]) 12/01/2018   Hypokalemia 12/01/2018   History of tobacco abuse 12/01/2018   Benign paroxysmal positional vertigo 07/25/2015   Preventative health care 07/06/2012   GERD (gastroesophageal reflux disease) 01/03/2012   Hyperglycemia 05/08/2011   General medical examination 05/06/2011   Gout 02/21/2010   Degenerative disc disease, lumbar 07/11/2009   Osteoarthritis 02/19/2009   Hyperlipidemia 01/16/2009   Obesity, Class III, BMI 40-49.9 (morbid obesity) ([place]) 02/17/2008   Depression 09/04/2007   Essential hypertension 09/04/2007    Percival Spanish, PT 02/01/2022, 1:19 PM  [facility] [address]  [place], [place], [address] Phone: [phone]   Fax:  [phone]  Name: Jordan [name] MRN: [id number] Date of Birth: 03/07/1951

## 2022-02-04 ENCOUNTER — Other Ambulatory Visit (HOSPITAL_BASED_OUTPATIENT_CLINIC_OR_DEPARTMENT_OTHER): Payer: Self-pay

## 2022-02-04 ENCOUNTER — Other Ambulatory Visit: Payer: Self-pay | Admitting: Family

## 2022-02-04 MED ORDER — HYDROCHLOROTHIAZIDE 25 MG PO TABS
25.0000 mg | ORAL_TABLET | Freq: Every day | ORAL | 0 refills | Status: DC
  Filled 2022-02-04: qty 90, 90d supply, fill #0

## 2022-02-04 MED ORDER — DULOXETINE HCL 60 MG PO CPEP
60.0000 mg | ORAL_CAPSULE | Freq: Every day | ORAL | 0 refills | Status: DC
  Filled 2022-02-04: qty 90, 90d supply, fill #0

## 2022-02-04 MED ORDER — ALLOPURINOL 100 MG PO TABS
200.0000 mg | ORAL_TABLET | Freq: Every day | ORAL | 0 refills | Status: DC
  Filled 2022-02-04: qty 180, 90d supply, fill #0

## 2022-02-06 ENCOUNTER — Encounter: Payer: Self-pay | Admitting: Physical Therapy

## 2022-02-06 ENCOUNTER — Other Ambulatory Visit: Payer: Self-pay

## 2022-02-06 ENCOUNTER — Ambulatory Visit: Payer: PPO | Admitting: Physical Therapy

## 2022-02-06 DIAGNOSIS — M542 Cervicalgia: Secondary | ICD-10-CM

## 2022-02-06 DIAGNOSIS — M5412 Radiculopathy, cervical region: Secondary | ICD-10-CM

## 2022-02-06 DIAGNOSIS — M62830 Muscle spasm of back: Secondary | ICD-10-CM

## 2022-02-06 DIAGNOSIS — M545 Low back pain, unspecified: Secondary | ICD-10-CM

## 2022-02-06 DIAGNOSIS — M5416 Radiculopathy, lumbar region: Secondary | ICD-10-CM

## 2022-02-06 DIAGNOSIS — R293 Abnormal posture: Secondary | ICD-10-CM

## 2022-02-06 DIAGNOSIS — M6281 Muscle weakness (generalized): Secondary | ICD-10-CM

## 2022-02-06 NOTE — Therapy (Signed)
[facility] [address]  [place], [place], [address] Phone: [phone]   Fax:  [phone]  Physical Therapy Treatment  Patient Details  Name: LORA CHAVERS MRN: [id number] Date of Birth: [birth year] Referring Provider (PT): Kristeen Miss, MD   Encounter Date: 02/06/2022   PT End of Session - 02/06/22 0846     Visit Number 3    Number of Visits 12    Date for PT Re-Evaluation 03/12/22    Authorization Type [employer]    PT Start Time 0846    PT Stop Time 0934    PT Time Calculation (min) 48 min    Activity Tolerance Patient tolerated treatment well    Behavior During Therapy [facility] for tasks assessed/performed             Past Medical History:  Diagnosis Date   Cellulitis and abscess of finger, unspecified 401.9   Cellulitis and abscess of foot, except toes    Depressive disorder, not elsewhere classified    GERD (gastroesophageal reflux disease)    Gout, unspecified    History of tobacco abuse 12/01/2018   Hyperglycemia 05/08/2011   Hyperlipidemia    borderline- not on meds   Hypertension    Insomnia    Leg edema    chronic   Obesity, unspecified    Osteoarthritis    Renal insufficiency, mild 05/08/2011   Small bowel obstruction [facility])     Past Surgical History:  Procedure Laterality Date   ABDOMINAL HYSTERECTOMY  2002   infection- no history of cancer   APPENDECTOMY  2002   catacact Right 11/01/2019   CHOLECYSTECTOMY  1977   ECTOPIC PREGNANCY SURGERY  1971   right shoulder rotator cuff repair  06-07-10   TOTAL HIP ARTHROPLASTY Left 1998    There were no vitals filed for this visit.   Subjective Assessment - 02/06/22 0850     Subjective Pt reports her neck has been bothering her somewhat but not to the degree it was and no longer feeling the crawling feeling in her L arm that she had previously noted. Still having intermittent low back pain.    Pertinent History L THA 20 yrs ago; R RCR     Diagnostic tests 11/24/2021  - Lumbar MRI: 1. Multilevel spinal degeneration with hyperlordosis, scoliosis, and  L4-5, L5-S1 anterolisthesis.  2. Compressive spinal stenosis at L2-3 and L4-5.  3. Right-sided foraminal impingement at L1-2, L2-3 and L4-5. Left foraminal impingement at T12-L1 and especially L4-5.   Cervical MRI: 1. Cervical spine degeneration with generalized progression since 2007. Mild C3-4 anterolisthesis.  2. Marked active facet arthritis on the left at C2-3. Discogenic edema leftward at C4-5.  3. C4-5 left paracentral to foraminal herniation flattening the left cord and causing advanced left foraminal stenosis.  4. C5-6 left foraminal impingement.  5. C6-7 central herniation indenting the ventral cord.    Patient Stated Goals "not be in pain and do what I like to do"    Currently in Pain? No/denies                               [facility] Adult PT Treatment/Exercise - 02/06/22 0846       Self-Care   Self-Care Posture    Posture Provided education on proper posture and body mechanics for sleeping, workstation set-up and typical daily chores/tasks to reduce strain on cervical and lumbar spine.  Neck Exercises: Seated   Neck Retraction 10 reps;5 secs    Other Seated Exercise Scap retraction 10 x 5" - cues to avoid shoulder shrug      Neck Exercises: Stretches   Upper Trapezius Stretch Right;Left;2 reps;30 seconds    Upper Trapezius Stretch Limitations cues to increase hold time    Levator Stretch Right;Left;2 reps;30 seconds      Lumbar Exercises: Stretches   Single Knee to Chest Stretch Right;Left;3 reps;30 seconds    Single Knee to Chest Stretch Limitations towel assist for ease of reach    Lower Trunk Rotation 5 reps   5-10 sec hold   Piriformis Stretch Right;Left;2 reps;30 seconds    Piriformis Stretch Limitations KTOS - towel assist for ease of reach    Figure 4 Stretch 2 reps;30 seconds;Supine;With overpressure      Lumbar Exercises: Aerobic    Nustep L4 x 6 min (UE/LE)      Lumbar Exercises: Supine   Pelvic Tilt 10 reps;5 seconds    Pelvic Tilt Limitations PPT    Clam 10 reps;3 seconds    Clam Limitations TrA + bent knee fallout   cues to avoid trunk rotation   Bent Knee Raise 10 reps;3 seconds    Bent Knee Raise Limitations TrA/PPT + march                     PT Education - 02/06/22 0933     Education Details Posture and body mechanics education for home and work activities    Person(s) Educated Patient    Methods Explanation;Demonstration;Handout    Comprehension Verbalized understanding              PT Short Term Goals - 02/01/22 0935       PT SHORT TERM GOAL #1   Title Patient will be independent with initial HEP    Status On-going    Target Date 02/19/22      PT SHORT TERM GOAL #2   Title Patient will verbalize/demonstrate good awareness of neutral spine posture and proper body mechanics for daily tasks    Status On-going    Target Date 02/19/22               PT Long Term Goals - 02/01/22 0935       PT LONG TERM GOAL #1   Title Patient will be independent with ongoing/advanced HEP for self-management at home    Status On-going    Target Date 03/12/22      PT LONG TERM GOAL #2   Title Patient to demonstrate ability to achieve and maintain good spinal alignment/posturing and body mechanics needed for daily activities    Status On-going    Target Date 03/12/22      PT LONG TERM GOAL #3   Title Patient to report reduction in frequency and intensity of neck and low back pain +/- radiculopathy by >/= 50% to allow for improved activity tolerance    Status On-going    Target Date 03/12/22      PT LONG TERM GOAL #4   Title Patient to report >/=50% reduction in frequency and intensity of headaches and cervicogenic dizziness/nausea    Status On-going    Target Date 03/12/22      PT LONG TERM GOAL #5   Title Patient to improve cervical and lumbar AROM to [facility] without pain provocation     Status On-going    Target Date 03/12/22      PT  LONG TERM GOAL #6   Title Patient will demonstrate improved B proximal UE and LE strength to >/= 4/5 for improved stability and ease of mobility as well as functional UE use    Status On-going    Target Date 03/12/22      PT LONG TERM GOAL #7   Title Patient to report ability to perform ADLs, household, leisure and work-related tasks without increased neck or low back pain or radiculopathy    Status On-going    Target Date 03/12/22                   Plan - 02/06/22 0854     Clinical Impression Statement Debroh reports limited opportunity to work on HEP due to working nights all weekend but did try to do some of the seated exercises while at her desk. HEP reviewed clarifying positioning, movement pattern and hold times between stretches and ROM/strengthening exercises. Pt having more difficulty with positioning for supine stretches on the left but denies increased pain. Session concluded with education on proper posture and body mechanics for sleeping position, workstation set-up, transitional mobility and typical daily chores/tasks to reduce strain on cervical and lumbar spine.    Comorbidities HTN, OA, L THA, R RCR, GERD, BPPV, obesity, chronic pain syndrome, anxiety, depression    Rehab Potential Good    PT Frequency 2x / week    PT Duration 6 weeks    PT Treatment/Interventions ADLs/Self Care Home Management;Cryotherapy;Electrical Stimulation;Iontophoresis 4mg /ml Dexamethasone;Moist Heat;Traction;Gait training;Stair training;Functional mobility training;Therapeutic activities;Therapeutic exercise;Balance training;Neuromuscular re-education;Patient/family education;Manual techniques;Passive range of motion;Dry needling;Taping;Spinal Manipulations    PT Next Visit Plan progress stretching, postural and core strengthening/stabilization as tolerated - HEP updates as indicated; posture & body mechanics education review PRN    PT Home  Exercise Plan Access Code: [email or web address] ([email or web address])    Consulted and Agree with Plan of Care Patient             Patient will benefit from skilled therapeutic intervention in order to improve the following deficits and impairments:  Abnormal gait, Decreased activity tolerance, Decreased balance, Decreased endurance, Decreased knowledge of precautions, Decreased mobility, Decreased range of motion, Decreased safety awareness, Decreased strength, Difficulty walking, Increased fascial restricitons, Increased muscle spasms, Impaired perceived functional ability, Impaired flexibility, Impaired sensation, Impaired UE functional use, Improper body mechanics, Postural dysfunction, Obesity, Pain  Visit Diagnosis: Chronic left-sided low back pain, unspecified whether sciatica present  Radiculopathy, lumbar region  Cervicalgia  Radiculopathy, cervical region  Abnormal posture  Muscle weakness (generalized)  Muscle spasm of back     Problem List Patient Active Problem List   Diagnosis Date Noted   Lumbar radiculopathy 11/09/2021   Cervical radiculopathy 11/09/2021   COVID-19 virus infection 08/08/2021   Chronic pain syndrome 06/26/2021   Morbidly obese ([place]) 10/09/2020   Acute hypoxemic respiratory failure due to COVID-19 ([place]) 10/08/2020   SBO (small bowel obstruction) ([facility]) 12/01/2018   Hypokalemia 12/01/2018   History of tobacco abuse 12/01/2018   Benign paroxysmal positional vertigo 07/25/2015   Preventative health care 07/06/2012   GERD (gastroesophageal reflux disease) 01/03/2012   Hyperglycemia 05/08/2011   General medical examination 05/06/2011   Gout 02/21/2010   Degenerative disc disease, lumbar 07/11/2009   Osteoarthritis 02/19/2009   Hyperlipidemia 01/16/2009   Obesity, Class III, BMI 40-49.9 (morbid obesity) ([place]) 02/17/2008   Depression 09/04/2007   Essential hypertension 09/04/2007    Percival Spanish, PT 02/06/2022, 11:24 AM  [facility] [address]  [place], [place], [address] Phone: [phone]   Fax:  [phone]  Name: CHALONDA SCHLATTER MRN: [id number] Date of Birth: 1951/09/12

## 2022-02-06 NOTE — Patient Instructions (Signed)
Access Code: [email or web address] URL: [email or web address] Date: 02/06/2022 Prepared by: Annie Paras  Exercises Seated Cervical Retraction - 2 x daily - 7 x weekly - 2 sets - 10 reps - 3-5 sec hold Seated Gentle Upper Trapezius Stretch - 2 x daily - 7 x weekly - 3 reps - 30 sec hold Gentle Levator Scapulae Stretch - 2 x daily - 7 x weekly - 3 reps - 30 sec hold Seated Scapular Retraction - 2 x daily - 7 x weekly - 2 sets - 10 reps - 5 sec hold Hooklying Single Knee to Chest Stretch with Towel - 2 x daily - 7 x weekly - 3 reps - 30 sec hold Supine Piriformis Stretch with Foot on Ground - 2 x daily - 7 x weekly - 3 reps - 30 sec hold Supine Figure 4 Piriformis Stretch - 2 x daily - 7 x weekly - 3 reps - 30 sec hold Supine Lower Trunk Rotation - 2 x daily - 7 x weekly - 5 reps - 10 sec hold Supine Posterior Pelvic Tilt - 2 x daily - 7 x weekly - 2 sets - 10 reps - 5 sec hold Supine March with Posterior Pelvic Tilt - 2 x daily - 7 x weekly - 2 sets - 10 reps - 3 sec hold Bent Knee Fallouts with Alternating Legs - 2 x daily - 7 x weekly - 2 sets - 10 reps - 3 sec hold Seated Flexion Stretch with Swiss Ball - 2 x daily - 7 x weekly - 3 reps - 30 sec hold  Patient Education Regulatory affairs officer

## 2022-02-13 ENCOUNTER — Ambulatory Visit: Payer: PPO

## 2022-02-14 ENCOUNTER — Other Ambulatory Visit: Payer: Self-pay

## 2022-02-14 ENCOUNTER — Ambulatory Visit: Payer: PPO | Admitting: Physical Therapy

## 2022-02-14 ENCOUNTER — Encounter: Payer: Self-pay | Admitting: Physical Therapy

## 2022-02-14 DIAGNOSIS — M6281 Muscle weakness (generalized): Secondary | ICD-10-CM

## 2022-02-14 DIAGNOSIS — G8929 Other chronic pain: Secondary | ICD-10-CM

## 2022-02-14 DIAGNOSIS — M545 Low back pain, unspecified: Secondary | ICD-10-CM | POA: Diagnosis not present

## 2022-02-14 DIAGNOSIS — M5416 Radiculopathy, lumbar region: Secondary | ICD-10-CM

## 2022-02-14 DIAGNOSIS — M5412 Radiculopathy, cervical region: Secondary | ICD-10-CM

## 2022-02-14 DIAGNOSIS — R293 Abnormal posture: Secondary | ICD-10-CM

## 2022-02-14 DIAGNOSIS — M542 Cervicalgia: Secondary | ICD-10-CM

## 2022-02-14 DIAGNOSIS — M62830 Muscle spasm of back: Secondary | ICD-10-CM

## 2022-02-14 NOTE — Therapy (Signed)
[facility] [address]  [place], [place], [address] Phone: [phone]   Fax:  [phone]  Physical Therapy Treatment  Patient Details  Name: Jordan [name] MRN: [id number] Date of Birth: Jul 29, 1951 Referring Provider (PT): Kristeen Miss, MD   Encounter Date: 02/14/2022   PT End of Session - 02/14/22 0846     Visit Number 4    Number of Visits 12    Date for PT Re-Evaluation 03/12/22    Authorization Type [employer]    PT Start Time 0846    PT Stop Time 0930    PT Time Calculation (min) 44 min    Activity Tolerance Patient tolerated treatment well    Behavior During Therapy [facility] for tasks assessed/performed             Past Medical History:  Diagnosis Date   Cellulitis and abscess of finger, unspecified 401.9   Cellulitis and abscess of foot, except toes    Depressive disorder, not elsewhere classified    GERD (gastroesophageal reflux disease)    Gout, unspecified    History of tobacco abuse 12/01/2018   Hyperglycemia 05/08/2011   Hyperlipidemia    borderline- not on meds   Hypertension    Insomnia    Leg edema    chronic   Obesity, unspecified    Osteoarthritis    Renal insufficiency, mild 05/08/2011   Small bowel obstruction [facility])     Past Surgical History:  Procedure Laterality Date   ABDOMINAL HYSTERECTOMY  2002   infection- no history of cancer   APPENDECTOMY  2002   catacact Right 11/01/2019   CHOLECYSTECTOMY  1977   ECTOPIC PREGNANCY SURGERY  1971   right shoulder rotator cuff repair  06-07-10   TOTAL HIP ARTHROPLASTY Left 1998    There were no vitals filed for this visit.   Subjective Assessment - 02/14/22 0849     Subjective Pt reports she has had to do her HEP on the bed rather than the floor due to difficulty getting up. She notes some soreness from doing the exercises but no pain, although she does note her sciatica (into B buttocks) has been acting up more frequently. She  thinks she may be also experiencing som RLS at night.    Pertinent History L THA 20 yrs ago; R RCR    Diagnostic tests 11/24/2021  - Lumbar MRI: 1. Multilevel spinal degeneration with hyperlordosis, scoliosis, and  L4-5, L5-S1 anterolisthesis.  2. Compressive spinal stenosis at L2-3 and L4-5.  3. Right-sided foraminal impingement at L1-2, L2-3 and L4-5. Left foraminal impingement at T12-L1 and especially L4-5.   Cervical MRI: 1. Cervical spine degeneration with generalized progression since 2007. Mild C3-4 anterolisthesis.  2. Marked active facet arthritis on the left at C2-3. Discogenic edema leftward at C4-5.  3. C4-5 left paracentral to foraminal herniation flattening the left cord and causing advanced left foraminal stenosis.  4. C5-6 left foraminal impingement.  5. C6-7 central herniation indenting the ventral cord.    Patient Stated Goals "not be in pain and do what I like to do"    Currently in Pain? No/denies                               [facility] Adult PT Treatment/Exercise - 02/14/22 0846       Neck Exercises: Theraband   Shoulder Extension 15 reps;Red    Shoulder Extension  Limitations cues for scap retraction & TrA isometric   standing   Rows 15 reps;Red    Rows Limitations cues for scap retraction & TrA isometric   standing   Shoulder External Rotation 10 reps;Red    Shoulder External Rotation Limitations cues for scap retraction & TrA isometric   seated   Horizontal ABduction 10 reps;Red    Horizontal ABduction Limitations cues for scap retraction & TrA isometric   seated   Other Theraband Exercises Alt red TB UE diagonals x 10, cues for scap retraction & TrA isometric   seated     Lumbar Exercises: Stretches   Piriformis Stretch Right;Left;2 reps;30 seconds    Piriformis Stretch Limitations seated KTOS    Figure 4 Stretch 2 reps;30 seconds;Seated    Figure 4 Stretch Limitations side-sitting hip hinge      Lumbar Exercises: Aerobic   Nustep L5 x 6 min (UE/LE)       Lumbar Exercises: Seated   Hip Flexion on Ball Both;10 reps    Hip Flexion on Ball Limitations w/o ball      Knee/Hip Exercises: Seated   Clamshell with TheraBand Red   alternatinf single LE + TrA isometric 10 x 3"   Marching Both;10 reps    Marching Limitations red TB + TrA isometric                     PT Education - 02/14/22 0930     Education Details HEP update    Person(s) Educated Patient    Methods Explanation;Demonstration;Verbal cues;Handout    Comprehension Verbalized understanding;Verbal cues required;Returned demonstration;Need further instruction              PT Short Term Goals - 02/14/22 0928       PT SHORT TERM GOAL #1   Title Patient will be independent with initial HEP    Status Achieved   02/14/22     PT SHORT TERM GOAL #2   Title Patient will verbalize/demonstrate good awareness of neutral spine posture and proper body mechanics for daily tasks    Status Achieved   02/14/22              PT Long Term Goals - 02/01/22 0935       PT LONG TERM GOAL #1   Title Patient will be independent with ongoing/advanced HEP for self-management at home    Status On-going    Target Date 03/12/22      PT LONG TERM GOAL #2   Title Patient to demonstrate ability to achieve and maintain good spinal alignment/posturing and body mechanics needed for daily activities    Status On-going    Target Date 03/12/22      PT LONG TERM GOAL #3   Title Patient to report reduction in frequency and intensity of neck and low back pain +/- radiculopathy by >/= 50% to allow for improved activity tolerance    Status On-going    Target Date 03/12/22      PT LONG TERM GOAL #4   Title Patient to report >/=50% reduction in frequency and intensity of headaches and cervicogenic dizziness/nausea    Status On-going    Target Date 03/12/22      PT LONG TERM GOAL #5   Title Patient to improve cervical and lumbar AROM to [facility] without pain provocation    Status  On-going    Target Date 03/12/22      PT LONG TERM GOAL #6   Title Patient  will demonstrate improved B proximal UE and LE strength to >/= 4/5 for improved stability and ease of mobility as well as functional UE use    Status On-going    Target Date 03/12/22      PT LONG TERM GOAL #7   Title Patient to report ability to perform ADLs, household, leisure and work-related tasks without increased neck or low back pain or radiculopathy    Status On-going    Target Date 03/12/22                   Plan - 02/14/22 0930     Clinical Impression Statement Candy reports good understanding of the posture and body mechanics education provided last visit and reports she has been trying to make sure her workstation is set-up properly as well as being more mindful of how she approaches transitional mobility such as car transfers, and tasks around her home - STG #2 met. She reports no issues with initial HEP but does admit to complete neck exercises more frequently than lower body exercises as she can fit them in while seated at work or during other seated tasks - STG #1 met. As such, provided seated alternatives for some of lower body stretches and strengthening activities including HEP instructions at pt request. Progressed postural strengthening while reinforcing core/lumbopelvic muscle engagement with good tolerance, updating HEP per pt request.    Comorbidities HTN, OA, L THA, R RCR, GERD, BPPV, obesity, chronic pain syndrome, anxiety, depression    Rehab Potential Good    PT Frequency 2x / week    PT Duration 6 weeks    PT Treatment/Interventions ADLs/Self Care Home Management;Cryotherapy;Electrical Stimulation;Iontophoresis 6m/ml Dexamethasone;Moist Heat;Traction;Gait training;Stair training;Functional mobility training;Therapeutic activities;Therapeutic exercise;Balance training;Neuromuscular re-education;Patient/family education;Manual techniques;Passive range of motion;Dry  needling;Taping;Spinal Manipulations    PT Next Visit Plan progress stretching, postural and core strengthening/stabilization as tolerated - HEP updates as indicated; posture & body mechanics education review PRN    PT Home Exercise Plan Access Code: [email or web address])    Consulted and Agree with Plan of Care Patient             Patient will benefit from skilled therapeutic intervention in order to improve the following deficits and impairments:  Abnormal gait, Decreased activity tolerance, Decreased balance, Decreased endurance, Decreased knowledge of precautions, Decreased mobility, Decreased range of motion, Decreased safety awareness, Decreased strength, Difficulty walking, Increased fascial restricitons, Increased muscle spasms, Impaired perceived functional ability, Impaired flexibility, Impaired sensation, Impaired UE functional use, Improper body mechanics, Postural dysfunction, Obesity, Pain  Visit Diagnosis: Chronic left-sided low back pain, unspecified whether sciatica present  Radiculopathy, lumbar region  Cervicalgia  Radiculopathy, cervical region  Abnormal posture  Muscle weakness (generalized)  Muscle spasm of back     Problem List Patient Active Problem List   Diagnosis Date Noted   Lumbar radiculopathy 11/09/2021   Cervical radiculopathy 11/09/2021   COVID-19 virus infection 08/08/2021   Chronic pain syndrome 06/26/2021   Morbidly obese ([facility] 10/09/2020   Acute hypoxemic respiratory failure due to COVID-19 ([facility] 10/08/2020   SBO (small bowel obstruction) (HCC) 12/01/2018   Hypokalemia 12/01/2018   History of tobacco abuse 12/01/2018   Benign paroxysmal positional vertigo 07/25/2015   Preventative health care 07/06/2012   GERD (gastroesophageal reflux disease) 01/03/2012   Hyperglycemia 05/08/2011   General medical examination 05/06/2011   Gout 02/21/2010   Degenerative disc disease, lumbar 07/11/2009   Osteoarthritis 02/19/2009   Hyperlipidemia  01/16/2009   Obesity, Class III, BMI 40-49.9 (morbid  obesity) ([place]) 02/17/2008   Depression 09/04/2007   Essential hypertension 09/04/2007    Percival Spanish, PT 02/14/2022, 12:28 PM  [facility] [address]  [place], [place], [address] Phone: [phone]   Fax:  [phone]  Name: RUTHEL MARTINE MRN: [id number] Date of Birth: Sep 28, 1951

## 2022-02-14 NOTE — Patient Instructions (Signed)
° ° ° °  Access Code: [email or web address] URL: [email or web address] Date: 02/14/2022 Prepared by: Annie Paras  Exercises Seated Cervical Retraction - 2 x daily - 7 x weekly - 2 sets - 10 reps - 3-5 sec hold Seated Gentle Upper Trapezius Stretch - 2 x daily - 7 x weekly - 3 reps - 30 sec hold Gentle Levator Scapulae Stretch - 2 x daily - 7 x weekly - 3 reps - 30 sec hold Seated Scapular Retraction - 2 x daily - 7 x weekly - 2 sets - 10 reps - 5 sec hold Hooklying Single Knee to Chest Stretch with Towel - 2 x daily - 7 x weekly - 3 reps - 30 sec hold Supine Piriformis Stretch with Foot on Ground - 2 x daily - 7 x weekly - 3 reps - 30 sec hold Supine Figure 4 Piriformis Stretch - 2 x daily - 7 x weekly - 3 reps - 30 sec hold Supine Lower Trunk Rotation - 2 x daily - 7 x weekly - 5 reps - 10 sec hold Supine Posterior Pelvic Tilt - 2 x daily - 7 x weekly - 2 sets - 10 reps - 5 sec hold Supine March with Posterior Pelvic Tilt - 2 x daily - 7 x weekly - 2 sets - 10 reps - 3 sec hold Bent Knee Fallouts with Alternating Legs - 2 x daily - 7 x weekly - 2 sets - 10 reps - 3 sec hold Seated Flexion Stretch with Swiss Ball - 2 x daily - 7 x weekly - 3 reps - 30 sec hold Seated Table Piriformis Stretch - 2-3 x daily - 7 x weekly - 3 reps - 30 sec hold Seated Piriformis Stretch - 2-3 x daily - 7 x weekly - 3 reps - 30 sec hold Seated Transversus Abdominis Bracing - 1 x daily - 7 x weekly - 2 sets - 10 reps - 3 sec hold Seated March with Resistance - 1 x daily - 3 x weekly - 2 sets - 10 reps - 3 sec hold Seated Isometric Hip Abduction with Resistance - 1 x daily - 3 x weekly - 2 sets - 10 reps - 3 sec hold Standing Shoulder Row with Anchored Resistance - 1 x daily - 3 x weekly - 2 sets - 10 reps - 5 sec hold Scapular Retraction with Resistance Advanced - 1 x daily - 3 x weekly - 2 sets - 10 reps - 5 sec hold Standing Shoulder Horizontal Abduction with Resistance - 1 x daily - 3 x weekly - 2 sets - 10  reps - 3 sec hold Standing Shoulder Diagonal Horizontal Abduction 60/120 Degrees with Resistance - 1 x daily - 3 x weekly - 2 sets - 10 reps - 3 sec hold  Patient Education Agricultural consultant Posture

## 2022-02-15 ENCOUNTER — Telehealth: Payer: Self-pay | Admitting: *Deleted

## 2022-02-15 NOTE — Chronic Care Management (AMB) (Signed)
Chronic Care Management   Note  02/15/2022 Name: Jordan [name] MRN: [id number] DOB: Apr 27, 1951  Jordan [name] is a 71 y.o. year old female who is a primary care patient of [name], NP. I reached out to [name] by phone today in response to a referral sent by Ms. Shongopovi PCP.  Ms. Roat was given information about Chronic Care Management services today including:  CCM service includes personalized support from designated clinical staff supervised by her physician, including individualized plan of care and coordination with other care providers 24/7 contact phone numbers for assistance for urgent and routine care needs. Service will only be billed when office clinical staff spend 20 minutes or more in a month to coordinate care. Only one practitioner may furnish and bill the service in a calendar month. The patient may stop CCM services at any time (effective at the end of the month) by phone call to the office staff. The patient is responsible for co-pay (up to 20% after annual deductible is met) if co-pay is required by the individual health plan.   Patient did not agree to enrollment in care management services and does not wish to consider at this time.  Follow up plan: Patient declines further follow up and engagement by the care management team. Appropriate care team members and provider have been notified via electronic communication.   Julian Hy, [facility]  Direct Dial: [phone]

## 2022-02-18 ENCOUNTER — Ambulatory Visit: Payer: PPO | Admitting: Physical Therapy

## 2022-02-18 ENCOUNTER — Other Ambulatory Visit: Payer: Self-pay

## 2022-02-18 ENCOUNTER — Encounter: Payer: Self-pay | Admitting: Physical Therapy

## 2022-02-18 DIAGNOSIS — M545 Low back pain, unspecified: Secondary | ICD-10-CM | POA: Diagnosis not present

## 2022-02-18 DIAGNOSIS — G8929 Other chronic pain: Secondary | ICD-10-CM

## 2022-02-18 DIAGNOSIS — M5416 Radiculopathy, lumbar region: Secondary | ICD-10-CM

## 2022-02-18 DIAGNOSIS — M542 Cervicalgia: Secondary | ICD-10-CM

## 2022-02-18 DIAGNOSIS — M6281 Muscle weakness (generalized): Secondary | ICD-10-CM

## 2022-02-18 DIAGNOSIS — M5412 Radiculopathy, cervical region: Secondary | ICD-10-CM

## 2022-02-18 DIAGNOSIS — M62830 Muscle spasm of back: Secondary | ICD-10-CM

## 2022-02-18 DIAGNOSIS — R293 Abnormal posture: Secondary | ICD-10-CM

## 2022-02-18 NOTE — Patient Instructions (Signed)
°  Access Code: [email or web address] URL: [email or web address] Date: 02/18/2022 Prepared by: Annie Paras  Exercises Seated Cervical Retraction - 2 x daily - 7 x weekly - 2 sets - 10 reps - 3-5 sec hold Seated Gentle Upper Trapezius Stretch - 2 x daily - 7 x weekly - 3 reps - 30 sec hold Gentle Levator Scapulae Stretch - 2 x daily - 7 x weekly - 3 reps - 30 sec hold Seated Scapular Retraction - 2 x daily - 7 x weekly - 2 sets - 10 reps - 5 sec hold Hooklying Single Knee to Chest Stretch with Towel - 2 x daily - 7 x weekly - 3 reps - 30 sec hold Supine Piriformis Stretch with Foot on Ground - 2 x daily - 7 x weekly - 3 reps - 30 sec hold Supine Figure 4 Piriformis Stretch - 2 x daily - 7 x weekly - 3 reps - 30 sec hold Supine Lower Trunk Rotation - 2 x daily - 7 x weekly - 5 reps - 10 sec hold Supine Posterior Pelvic Tilt - 2 x daily - 7 x weekly - 2 sets - 10 reps - 5 sec hold Supine March with Posterior Pelvic Tilt - 2 x daily - 7 x weekly - 2 sets - 10 reps - 3 sec hold Bent Knee Fallouts with Alternating Legs - 2 x daily - 7 x weekly - 2 sets - 10 reps - 3 sec hold Seated Flexion Stretch with Swiss Ball - 2 x daily - 7 x weekly - 3 reps - 30 sec hold Seated Table Piriformis Stretch - 2-3 x daily - 7 x weekly - 3 reps - 30 sec hold Seated Piriformis Stretch - 2-3 x daily - 7 x weekly - 3 reps - 30 sec hold Seated Transversus Abdominis Bracing - 1 x daily - 7 x weekly - 2 sets - 10 reps - 3 sec hold Seated March with Resistance - 1 x daily - 3 x weekly - 2 sets - 10 reps - 3 sec hold Seated Isometric Hip Abduction with Resistance - 1 x daily - 3 x weekly - 2 sets - 10 reps - 3 sec hold Standing Shoulder Row with Anchored Resistance - 1 x daily - 3 x weekly - 2 sets - 10 reps - 5 sec hold Scapular Retraction with Resistance Advanced - 1 x daily - 3 x weekly - 2 sets - 10 reps - 5 sec hold Standing Shoulder Horizontal Abduction with Resistance - 1 x daily - 3 x weekly - 2 sets - 10 reps - 3  sec hold Standing Shoulder Diagonal Horizontal Abduction 60/120 Degrees with Resistance - 1 x daily - 3 x weekly - 2 sets - 10 reps - 3 sec hold Sternocleidomastoid Stretch - 2-3 x daily - 7 x weekly - 3 reps - 30 sec hold  Patient Education Agricultural consultant Posture

## 2022-02-18 NOTE — Therapy (Addendum)
[facility] [address]  [place], [place], [address] Phone: [phone]   Fax:  [phone]  Physical Therapy Treatment  Patient Details  Name: Jordan [name] MRN: [id number] Date of Birth: Mar 18, 1951 Referring Provider (PT): Kristeen Miss, MD   Encounter Date: 02/18/2022   PT End of Session - 02/18/22 0940     Visit Number 5    Number of Visits 12    Date for PT Re-Evaluation 03/12/22    Authorization Type [employer]    PT Start Time 0940    PT Stop Time 1026    PT Time Calculation (min) 46 min    Activity Tolerance Patient tolerated treatment well    Behavior During Therapy [facility] for tasks assessed/performed             Past Medical History:  Diagnosis Date   Cellulitis and abscess of finger, unspecified 401.9   Cellulitis and abscess of foot, except toes    Depressive disorder, not elsewhere classified    GERD (gastroesophageal reflux disease)    Gout, unspecified    History of tobacco abuse 12/01/2018   Hyperglycemia 05/08/2011   Hyperlipidemia    borderline- not on meds   Hypertension    Insomnia    Leg edema    chronic   Obesity, unspecified    Osteoarthritis    Renal insufficiency, mild 05/08/2011   Small bowel obstruction [facility])     Past Surgical History:  Procedure Laterality Date   ABDOMINAL HYSTERECTOMY  2002   infection- no history of cancer   APPENDECTOMY  2002   catacact Right 11/01/2019   CHOLECYSTECTOMY  1977   ECTOPIC PREGNANCY SURGERY  1971   right shoulder rotator cuff repair  06-07-10   TOTAL HIP ARTHROPLASTY Left 1998    There were no vitals filed for this visit.   Subjective Assessment - 02/18/22 0944     Subjective Pt reports she had a good weekend but woke up with some soreness in her neck today - thinks it may have be due to how she slept as she did not have any pain last night.    Pertinent History L THA 20 yrs ago; R RCR    Diagnostic tests 11/24/2021  - Lumbar  MRI: 1. Multilevel spinal degeneration with hyperlordosis, scoliosis, and  L4-5, L5-S1 anterolisthesis.  2. Compressive spinal stenosis at L2-3 and L4-5.  3. Right-sided foraminal impingement at L1-2, L2-3 and L4-5. Left foraminal impingement at T12-L1 and especially L4-5.   Cervical MRI: 1. Cervical spine degeneration with generalized progression since 2007. Mild C3-4 anterolisthesis.  2. Marked active facet arthritis on the left at C2-3. Discogenic edema leftward at C4-5.  3. C4-5 left paracentral to foraminal herniation flattening the left cord and causing advanced left foraminal stenosis.  4. C5-6 left foraminal impingement.  5. C6-7 central herniation indenting the ventral cord.    Patient Stated Goals "not be in pain and do what I like to do"    Currently in Pain? Yes    Pain Score 0-No pain    Pain Location Back    Pain Orientation Lower    Pain Score 2   1-2/10   Pain Location Neck    Pain Orientation Right    Pain Descriptors / Indicators Sore    Pain Type Chronic pain  [facility] Adult PT Treatment/Exercise - 02/18/22 0940       Neck Exercises: Machines for Strengthening   Cybex Row 20# low row 2 x10 - cues for upright posture, abdominal bracing and scap retraction, avoiding rounding back or shoulders    Lat Pull 15# seated lat pull x 10, standing x 10   pt noting increase in her sciatica pain but able to get relief with standing trunk flexion     Neck Exercises: Stretches   Upper Trapezius Stretch Right;2 reps;30 seconds    Levator Stretch Right;2 reps;30 seconds    Other Neck Stretches R scalene stretches with towel over shoulder 3 x 30 sec    Other Neck Stretches R SCM stretch 2 x 30 sec      Lumbar Exercises: Aerobic   Nustep L5 x 6 min (UE/LE)      Manual Therapy   Manual Therapy Soft tissue mobilization;Myofascial release;Passive ROM;Manual Traction    Soft tissue mobilization STM to R>L suboccipitals, paraspinals, scalenes,  SCM, UT & LS    Myofascial Release B suboccipital release    Passive ROM R UT, LS, SCM & scalene stretches    Manual Traction gentle cervical distraction 3 x 30 sec                     PT Education - 02/18/22 1026     Education Details HEP update - SCM stretch    Person(s) Educated Patient    Methods Explanation;Demonstration;Verbal cues;Handout    Comprehension Verbalized understanding;Verbal cues required;Returned demonstration;Need further instruction              PT Short Term Goals - 02/14/22 0928       PT SHORT TERM GOAL #1   Title Patient will be independent with initial HEP    Status Achieved   02/14/22     PT SHORT TERM GOAL #2   Title Patient will verbalize/demonstrate good awareness of neutral spine posture and proper body mechanics for daily tasks    Status Achieved   02/14/22              PT Long Term Goals - 02/18/22 0948       PT LONG TERM GOAL #1   Title Patient will be independent with ongoing/advanced HEP for self-management at home    Status Partially Met   02/18/22 - Pt reports seated exercises are working better for her   Target Date 03/12/22      PT LONG TERM GOAL #2   Title Patient to demonstrate ability to achieve and maintain good spinal alignment/posturing and body mechanics needed for daily activities    Status On-going    Target Date 03/12/22      PT LONG TERM GOAL #3   Title Patient to report reduction in frequency and intensity of neck and low back pain +/- radiculopathy by >/= 50% to allow for improved activity tolerance    Status On-going    Target Date 03/12/22      PT LONG TERM GOAL #4   Title Patient to report >/=50% reduction in frequency and intensity of headaches and cervicogenic dizziness/nausea    Status On-going    Target Date 03/12/22      PT LONG TERM GOAL #5   Title Patient to improve cervical and lumbar AROM to [facility] without pain provocation    Status On-going    Target Date 03/12/22      PT LONG TERM  GOAL #6  Title Patient will demonstrate improved B proximal UE and LE strength to >/= 4/5 for improved stability and ease of mobility as well as functional UE use    Status On-going    Target Date 03/12/22      PT LONG TERM GOAL #7   Title Patient to report ability to perform ADLs, household, leisure and work-related tasks without increased neck or low back pain or radiculopathy    Status On-going    Target Date 03/12/22                   Plan - 02/18/22 0949     Clinical Impression Statement Trenice reports the seated alternatives for the low back stretches and exercises are going to work better for her and denies any need for review today. She notes some increased soreness in the R side of her neck today, likely due to how she slept last night. Addressed muscle soreness with MT including STM, suboccipital release and manual stretching as well as review of cervical stretches adding SCM stretch as pt localizing the soreness to behind her ear and up into her skull. Progressed core and postural strengthening with machine rows and pulldowns, emphasizing proper muscle engagement and postural awareness. Pt noting some irritation of her sciatica with the pulldowns, but able to get relief from standing trunk flexion.    Comorbidities HTN, OA, L THA, R RCR, GERD, BPPV, obesity, chronic pain syndrome, anxiety, depression    Rehab Potential Good    PT Frequency 2x / week    PT Duration 6 weeks    PT Treatment/Interventions ADLs/Self Care Home Management;Cryotherapy;Electrical Stimulation;Iontophoresis 12m/ml Dexamethasone;Moist Heat;Traction;Gait training;Stair training;Functional mobility training;Therapeutic activities;Therapeutic exercise;Balance training;Neuromuscular re-education;Patient/family education;Manual techniques;Passive range of motion;Dry needling;Taping;Spinal Manipulations    PT Next Visit Plan progress stretching, postural and core strengthening/stabilization as tolerated - HEP  updates as indicated; posture & body mechanics education review PRN    PT Home Exercise Plan Access Code: [email or web address](2/10, updated 2/15, 2/23 & 2/27)    Consulted and Agree with Plan of Care Patient             Patient will benefit from skilled therapeutic intervention in order to improve the following deficits and impairments:  Abnormal gait, Decreased activity tolerance, Decreased balance, Decreased endurance, Decreased knowledge of precautions, Decreased mobility, Decreased range of motion, Decreased safety awareness, Decreased strength, Difficulty walking, Increased fascial restricitons, Increased muscle spasms, Impaired perceived functional ability, Impaired flexibility, Impaired sensation, Impaired UE functional use, Improper body mechanics, Postural dysfunction, Obesity, Pain  Visit Diagnosis: Chronic left-sided low back pain, unspecified whether sciatica present  Radiculopathy, lumbar region  Cervicalgia  Radiculopathy, cervical region  Abnormal posture  Muscle weakness (generalized)  Muscle spasm of back     Problem List Patient Active Problem List   Diagnosis Date Noted   Lumbar radiculopathy 11/09/2021   Cervical radiculopathy 11/09/2021   COVID-19 virus infection 08/08/2021   Chronic pain syndrome 06/26/2021   Morbidly obese ([place] 10/09/2020   Acute hypoxemic respiratory failure due to COVID-19 ([facility] 10/08/2020   SBO (small bowel obstruction) ([facility] 12/01/2018   Hypokalemia 12/01/2018   History of tobacco abuse 12/01/2018   Benign paroxysmal positional vertigo 07/25/2015   Preventative health care 07/06/2012   GERD (gastroesophageal reflux disease) 01/03/2012   Hyperglycemia 05/08/2011   General medical examination 05/06/2011   Gout 02/21/2010   Degenerative disc disease, lumbar 07/11/2009   Osteoarthritis 02/19/2009   Hyperlipidemia 01/16/2009   Obesity, Class III, BMI 40-49.9 (morbid obesity) ([place] 02/17/2008  Depression 09/04/2007   Essential  hypertension 09/04/2007    Percival Spanish, PT 02/18/2022, 10:39 AM  [facility] [address]  [place], [place], [address] Phone: [phone]   Fax:  [phone]  Name: TELIA AMUNDSON MRN: [id number] Date of Birth: 1951/10/23

## 2022-02-21 ENCOUNTER — Ambulatory Visit: Payer: PPO | Attending: Neurological Surgery

## 2022-02-21 ENCOUNTER — Other Ambulatory Visit: Payer: Self-pay

## 2022-02-21 DIAGNOSIS — R293 Abnormal posture: Secondary | ICD-10-CM | POA: Diagnosis not present

## 2022-02-21 DIAGNOSIS — M5416 Radiculopathy, lumbar region: Secondary | ICD-10-CM | POA: Insufficient documentation

## 2022-02-21 DIAGNOSIS — M545 Low back pain, unspecified: Secondary | ICD-10-CM

## 2022-02-21 DIAGNOSIS — G8929 Other chronic pain: Secondary | ICD-10-CM | POA: Diagnosis not present

## 2022-02-21 DIAGNOSIS — M62830 Muscle spasm of back: Secondary | ICD-10-CM | POA: Insufficient documentation

## 2022-02-21 DIAGNOSIS — M5412 Radiculopathy, cervical region: Secondary | ICD-10-CM | POA: Diagnosis not present

## 2022-02-21 DIAGNOSIS — M542 Cervicalgia: Secondary | ICD-10-CM | POA: Diagnosis not present

## 2022-02-21 DIAGNOSIS — M6281 Muscle weakness (generalized): Secondary | ICD-10-CM | POA: Insufficient documentation

## 2022-02-21 NOTE — Therapy (Signed)
[facility] [address]  [place], [place], [address] Phone: [phone]   Fax:  [phone]  Physical Therapy Treatment  Patient Details  Name: Jordan [name] MRN: [id number] Date of Birth: 1951-03-23 Referring Provider (PT): Kristeen Miss, MD   Encounter Date: 02/21/2022   PT End of Session - 02/21/22 0931     Visit Number 6    Number of Visits 12    Date for PT Re-Evaluation 03/12/22    Authorization Type [employer]    PT Start Time 7619    PT Stop Time 0928    PT Time Calculation (min) 41 min    Activity Tolerance Patient tolerated treatment well    Behavior During Therapy [facility] for tasks assessed/performed             Past Medical History:  Diagnosis Date   Cellulitis and abscess of finger, unspecified 401.9   Cellulitis and abscess of foot, except toes    Depressive disorder, not elsewhere classified    GERD (gastroesophageal reflux disease)    Gout, unspecified    History of tobacco abuse 12/01/2018   Hyperglycemia 05/08/2011   Hyperlipidemia    borderline- not on meds   Hypertension    Insomnia    Leg edema    chronic   Obesity, unspecified    Osteoarthritis    Renal insufficiency, mild 05/08/2011   Small bowel obstruction [facility])     Past Surgical History:  Procedure Laterality Date   ABDOMINAL HYSTERECTOMY  2002   infection- no history of cancer   APPENDECTOMY  2002   catacact Right 11/01/2019   CHOLECYSTECTOMY  1977   ECTOPIC PREGNANCY SURGERY  1971   right shoulder rotator cuff repair  06-07-10   TOTAL HIP ARTHROPLASTY Left 1998    There were no vitals filed for this visit.   Subjective Assessment - 02/21/22 0850     Subjective Pt reports no pain, couldn't sleep well yesterday due to personal issues.    Pertinent History L THA 20 yrs ago; R RCR    Diagnostic tests 11/24/2021  - Lumbar MRI: 1. Multilevel spinal degeneration with hyperlordosis, scoliosis, and  L4-5, L5-S1  anterolisthesis.  2. Compressive spinal stenosis at L2-3 and L4-5.  3. Right-sided foraminal impingement at L1-2, L2-3 and L4-5. Left foraminal impingement at T12-L1 and especially L4-5.   Cervical MRI: 1. Cervical spine degeneration with generalized progression since 2007. Mild C3-4 anterolisthesis.  2. Marked active facet arthritis on the left at C2-3. Discogenic edema leftward at C4-5.  3. C4-5 left paracentral to foraminal herniation flattening the left cord and causing advanced left foraminal stenosis.  4. C5-6 left foraminal impingement.  5. C6-7 central herniation indenting the ventral cord.    Patient Stated Goals "not be in pain and do what I like to do"    Currently in Pain? No/denies                               [facility] Adult PT Treatment/Exercise - 02/21/22 0001       Neck Exercises: Machines for Strengthening   Cybex Row 25# low row 2 x10 - cues for upright posture, abdominal bracing and scap retraction, avoiding rounding back or shoulders      Lumbar Exercises: Stretches   Standing Extension 10 reps    Standing Extension Limitations 3 sec hold, over counter    Other Lumbar Stretch  Exercise flexion stretch with orange ball 10x5"      Lumbar Exercises: Aerobic   Recumbent Bike L1x26mn      Lumbar Exercises: Seated   Other Seated Lumbar Exercises ab set with orange pball 10x3"      Manual Therapy   Manual Therapy Soft tissue mobilization    Soft tissue mobilization STM/IASTM (foam roll) to thoracolumbar paraspinals with TPR                      PT Short Term Goals - 02/14/22 0928       PT SHORT TERM GOAL #1   Title Patient will be independent with initial HEP    Status Achieved   02/14/22     PT SHORT TERM GOAL #2   Title Patient will verbalize/demonstrate good awareness of neutral spine posture and proper body mechanics for daily tasks    Status Achieved   02/14/22              PT Long Term Goals - 02/18/22 0948       PT LONG  TERM GOAL #1   Title Patient will be independent with ongoing/advanced HEP for self-management at home    Status Partially Met   02/18/22 - Pt reports seated exercises are working better for her   Target Date 03/12/22      PT LONG TERM GOAL #2   Title Patient to demonstrate ability to achieve and maintain good spinal alignment/posturing and body mechanics needed for daily activities    Status On-going    Target Date 03/12/22      PT LONG TERM GOAL #3   Title Patient to report reduction in frequency and intensity of neck and low back pain +/- radiculopathy by >/= 50% to allow for improved activity tolerance    Status On-going    Target Date 03/12/22      PT LONG TERM GOAL #4   Title Patient to report >/=50% reduction in frequency and intensity of headaches and cervicogenic dizziness/nausea    Status On-going    Target Date 03/12/22      PT LONG TERM GOAL #5   Title Patient to improve cervical and lumbar [facility]    Status On-going    Target Date 03/12/22      PT LONG TERM GOAL #6   Title Patient will demonstrate improved B proximal UE and LE strength to >/= 4/5 for improved stability and ease of mobility as well as functional UE use    Status On-going    Target Date 03/12/22      PT LONG TERM GOAL #7   Title Patient to report ability to perform ADLs, household, leisure and work-related tasks without increased neck or low back pain or radiculopathy    Status On-going    Target Date 03/12/22                   Plan - 02/21/22 0932     Clinical Impression Statement Pt reported some more soreness along the lower thoracic spine into the lumbar spine. STM was done to the thoracolumbar paraspinals with foam roll and reviewed different variations of self mobilization with foam roll at home to decrease stiffness. Cues needed to keep scapulae depressed during rows. Cues also given to avoid painful ROM with standing extensions at counter. Pt responded  well to treatment.    Personal Factors and Comorbidities Age;Comorbidity 3+;Fitness;Past/Current Experience;Profession;Time since onset of injury/illness/exacerbation  Comorbidities HTN, OA, L THA, R RCR, GERD, BPPV, obesity, chronic pain syndrome, anxiety, depression    PT Frequency 2x / week    PT Duration 6 weeks    PT Treatment/Interventions ADLs/Self Care Home Management;Cryotherapy;Electrical Stimulation;Iontophoresis 61m/ml Dexamethasone;Moist Heat;Traction;Gait training;Stair training;Functional mobility training;Therapeutic activities;Therapeutic exercise;Balance training;Neuromuscular re-education;Patient/family education;Manual techniques;Passive range of motion;Dry needling;Taping;Spinal Manipulations    PT Next Visit Plan progress stretching, postural and core strengthening/stabilization as tolerated - HEP updates as indicated; posture & body mechanics education review PRN    PT Home Exercise Plan Access Code: [email or web address](2/10, updated 2/15, 2/23 & 2/27)    Consulted and Agree with Plan of Care Patient             Patient will benefit from skilled therapeutic intervention in order to improve the following deficits and impairments:  Abnormal gait, Decreased activity tolerance, Decreased balance, Decreased endurance, Decreased knowledge of precautions, Decreased mobility, Decreased range of motion, Decreased safety awareness, Decreased strength, Difficulty walking, Increased fascial restricitons, Increased muscle spasms, Impaired perceived functional ability, Impaired flexibility, Impaired sensation, Impaired UE functional use, Improper body mechanics, Postural dysfunction, Obesity, Pain  Visit Diagnosis: Chronic left-sided low back pain, unspecified whether sciatica present  Radiculopathy, lumbar region  Cervicalgia  Radiculopathy, cervical region  Abnormal posture  Muscle weakness (generalized)  Muscle spasm of back     Problem List Patient Active Problem List    Diagnosis Date Noted   Lumbar radiculopathy 11/09/2021   Cervical radiculopathy 11/09/2021   COVID-19 virus infection 08/08/2021   Chronic pain syndrome 06/26/2021   Morbidly obese ([facility] 10/09/2020   Acute hypoxemic respiratory failure due to COVID-19 ([facility] 10/08/2020   SBO (small bowel obstruction) ([place] 12/01/2018   Hypokalemia 12/01/2018   History of tobacco abuse 12/01/2018   Benign paroxysmal positional vertigo 07/25/2015   Preventative health care 07/06/2012   GERD (gastroesophageal reflux disease) 01/03/2012   Hyperglycemia 05/08/2011   General medical examination 05/06/2011   Gout 02/21/2010   Degenerative disc disease, lumbar 07/11/2009   Osteoarthritis 02/19/2009   Hyperlipidemia 01/16/2009   Obesity, Class III, BMI 40-49.9 (morbid obesity) ([facility] 02/17/2008   Depression 09/04/2007   Essential hypertension 09/04/2007    BArtist Pais PTA 02/21/2022, 10:41 AM  [facility] [place] [address]Phone: [phone]  Fax:  [phone] Name: Jordan TOURIGNYMRN: [id number] of Birth: 1February 10, 1952

## 2022-02-26 ENCOUNTER — Other Ambulatory Visit: Payer: Self-pay

## 2022-02-26 ENCOUNTER — Ambulatory Visit: Payer: PPO | Admitting: Physical Therapy

## 2022-02-26 ENCOUNTER — Encounter: Payer: Self-pay | Admitting: Physical Therapy

## 2022-02-26 DIAGNOSIS — M6281 Muscle weakness (generalized): Secondary | ICD-10-CM

## 2022-02-26 DIAGNOSIS — R293 Abnormal posture: Secondary | ICD-10-CM

## 2022-02-26 DIAGNOSIS — M545 Low back pain, unspecified: Secondary | ICD-10-CM | POA: Diagnosis not present

## 2022-02-26 DIAGNOSIS — M542 Cervicalgia: Secondary | ICD-10-CM

## 2022-02-26 DIAGNOSIS — M5412 Radiculopathy, cervical region: Secondary | ICD-10-CM

## 2022-02-26 DIAGNOSIS — M62830 Muscle spasm of back: Secondary | ICD-10-CM

## 2022-02-26 DIAGNOSIS — M5416 Radiculopathy, lumbar region: Secondary | ICD-10-CM

## 2022-02-26 DIAGNOSIS — G8929 Other chronic pain: Secondary | ICD-10-CM

## 2022-02-26 NOTE — Therapy (Signed)
[place] [address]  [place], [place], [address] Phone: [phone]   Fax:  [phone]  Physical Therapy Treatment  Patient Details  Name: Jordan [name] MRN: [id number] Date of Birth: [birth year] Referring Provider (PT): Kristeen Miss, MD   Encounter Date: 02/26/2022   PT End of Session - 02/26/22 0937     Visit Number 7    Number of Visits 12    Date for PT Re-Evaluation 03/12/22    Authorization Type [employer]    PT Start Time 0937    PT Stop Time 1018    PT Time Calculation (min) 41 min    Activity Tolerance Patient tolerated treatment well    Behavior During Therapy [facility] for tasks assessed/performed             Past Medical History:  Diagnosis Date   Cellulitis and abscess of finger, unspecified 401.9   Cellulitis and abscess of foot, except toes    Depressive disorder, not elsewhere classified    GERD (gastroesophageal reflux disease)    Gout, unspecified    History of tobacco abuse 12/01/2018   Hyperglycemia 05/08/2011   Hyperlipidemia    borderline- not on meds   Hypertension    Insomnia    Leg edema    chronic   Obesity, unspecified    Osteoarthritis    Renal insufficiency, mild 05/08/2011   Small bowel obstruction [facility])     Past Surgical History:  Procedure Laterality Date   ABDOMINAL HYSTERECTOMY  2002   infection- no history of cancer   APPENDECTOMY  2002   catacact Right 11/01/2019   CHOLECYSTECTOMY  1977   ECTOPIC PREGNANCY SURGERY  1971   right shoulder rotator cuff repair  06-07-10   TOTAL HIP ARTHROPLASTY Left 1998    There were no vitals filed for this visit.   Subjective Assessment - 02/26/22 0941     Subjective Pt denies pain today. Pt feels that PT is helping - she notes decreasing reliance on cane.    Pertinent History L THA 20 yrs ago; R RCR    Diagnostic tests 11/24/2021  - Lumbar MRI: 1. Multilevel spinal degeneration with hyperlordosis, scoliosis, and   L4-5, L5-S1 anterolisthesis.  2. Compressive spinal stenosis at L2-3 and L4-5.  3. Right-sided foraminal impingement at L1-2, L2-3 and L4-5. Left foraminal impingement at T12-L1 and especially L4-5.   Cervical MRI: 1. Cervical spine degeneration with generalized progression since 2007. Mild C3-4 anterolisthesis.  2. Marked active facet arthritis on the left at C2-3. Discogenic edema leftward at C4-5.  3. C4-5 left paracentral to foraminal herniation flattening the left cord and causing advanced left foraminal stenosis.  4. C5-6 left foraminal impingement.  5. C6-7 central herniation indenting the ventral cord.    Patient Stated Goals "not be in pain and do what I like to do"    Currently in Pain? No/denies                               [facility] Adult PT Treatment/Exercise - 02/26/22 0937       Neck Exercises: Standing   Upper Extremity D1 Extension;10 reps;Theraband    Theraband Level (UE D1) Level 2 (Red)    Upper Extremity D2 Extension;10 reps;Theraband    Theraband Level (UE D2) Level 2 (Red)      Neck Exercises: Seated   Cervical Isometrics Extension;Right lateral flexion;Left lateral flexion  Cervical Isometrics Limitations reactive isometric with red TB pulling forward    Neck Retraction 10 reps;5 secs    Neck Retraction Limitations red TB    Cervical Rotation Right;Left;10 reps    Cervical Rotation Limitations SNAGs with pillowcase    Other Seated Exercise Cervical extension SNAGs 10 x 3"      Lumbar Exercises: Aerobic   UBE (Upper Arm Bike) L2.0 x 6 min (3 min each fwd & back)                     PT Education - 02/26/22 1015     Education Details HEP update - cervical SNAGs    Person(s) Educated Patient    Methods Explanation;Demonstration;Verbal cues;Tactile cues;Handout    Comprehension Verbalized understanding;Verbal cues required;Tactile cues required;Returned demonstration;Need further instruction              PT Short Term Goals -  02/14/22 0928       PT SHORT TERM GOAL #1   Title Patient will be independent with initial HEP    Status Achieved   02/14/22     PT SHORT TERM GOAL #2   Title Patient will verbalize/demonstrate good awareness of neutral spine posture and proper body mechanics for daily tasks    Status Achieved   02/14/22              PT Long Term Goals - 02/26/22 0943       PT LONG TERM GOAL #1   Title Patient will be independent with ongoing/advanced HEP for self-management at home    Status Partially Met   02/18/22 - Pt reports seated exercises are working better for her   Target Date 03/12/22      PT LONG TERM GOAL #2   Title Patient to demonstrate ability to achieve and maintain good spinal alignment/posturing and body mechanics needed for daily activities    Status Partially Met   02/26/22 - Pt reports better awareness of her posture but still has to remind herself.   Target Date 03/12/22      PT LONG TERM GOAL #3   Title Patient to report reduction in frequency and intensity of neck and low back pain +/- radiculopathy by >/= 50% to allow for improved activity tolerance    Status Achieved   02/26/22 - Pt reports 70-75% reduction in neck and back pain, and 99% reduction in LE radiculopathy     PT LONG TERM GOAL #4   Title Patient to report >/=50% reduction in frequency and intensity of headaches and cervicogenic dizziness/nausea    Status Achieved   02/26/22 - Pt reports 60% reduction in frequency of headaches and no longer having the rams horn distribution     PT LONG TERM GOAL #5   Title Patient to improve cervical and lumbar AROM to [facility] without pain provocation    Status On-going    Target Date 03/12/22      PT LONG TERM GOAL #6   Title Patient will demonstrate improved B proximal UE and LE strength to >/= 4/5 for improved stability and ease of mobility as well as functional UE use    Status On-going    Target Date 03/12/22      PT LONG TERM GOAL #7   Title Patient to report ability to  perform ADLs, household, leisure and work-related tasks without increased neck or low back pain or radiculopathy    Status Partially Met   02/26/22 - Pt reports overall  improvement but still notes limited tolerance for sitting or walking long periods   Target Date 03/12/22                   Plan - 02/26/22 1018     Clinical Impression Statement Pearson feels that PT has been helping and reports 70-75% reduction in neck and back pain, and 99% reduction in LE radiculopathy as well as 60% reduction in frequency of headaches with headaches no longer having the rams horn distribution. She continues to note some restriction with end range cervical rotation, therefore introduced cervical SNAGs to facilitate increased ease of motion. She notes improving awareness of her posture but still has to remind herself at times and recognizes times where lack of postural awareness has contributed to increased pain such as when watching TV. Promoted improved postural strength in cervical muscles with resisted retraction and cervical isometrics. Briauna is progressing well toward goals with all STGs met and several LTGs met or partially met.    Comorbidities HTN, OA, L THA, R RCR, GERD, BPPV, obesity, chronic pain syndrome, anxiety, depression    Rehab Potential Good    PT Frequency 2x / week    PT Duration 6 weeks    PT Treatment/Interventions ADLs/Self Care Home Management;Cryotherapy;Electrical Stimulation;Iontophoresis 46m/ml Dexamethasone;Moist Heat;Traction;Gait training;Stair training;Functional mobility training;Therapeutic activities;Therapeutic exercise;Balance training;Neuromuscular re-education;Patient/family education;Manual techniques;Passive range of motion;Dry needling;Taping;Spinal Manipulations    PT Next Visit Plan progress stretching, postural and core strengthening/stabilization as tolerated - HEP updates as indicated; posture & body mechanics education review PRN    PT Home Exercise Plan  Access Code: [email or web address](2/10, updated 2/15, 2/23 & 2/27)    Consulted and Agree with Plan of Care Patient             Patient will benefit from skilled therapeutic intervention in order to improve the following deficits and impairments:  Abnormal gait, Decreased activity tolerance, Decreased balance, Decreased endurance, Decreased knowledge of precautions, Decreased mobility, Decreased range of motion, Decreased safety awareness, Decreased strength, Difficulty walking, Increased fascial restricitons, Increased muscle spasms, Impaired perceived functional ability, Impaired flexibility, Impaired sensation, Impaired UE functional use, Improper body mechanics, Postural dysfunction, Obesity, Pain  Visit Diagnosis: Chronic left-sided low back pain, unspecified whether sciatica present  Radiculopathy, lumbar region  Cervicalgia  Radiculopathy, cervical region  Abnormal posture  Muscle weakness (generalized)  Muscle spasm of back     Problem List Patient Active Problem List   Diagnosis Date Noted   Lumbar radiculopathy 11/09/2021   Cervical radiculopathy 11/09/2021   COVID-19 virus infection 08/08/2021   Chronic pain syndrome 06/26/2021   Morbidly obese ([place] 10/09/2020   Acute hypoxemic respiratory failure due to COVID-19 ([place] 10/08/2020   SBO (small bowel obstruction) ([place] 12/01/2018   Hypokalemia 12/01/2018   History of tobacco abuse 12/01/2018   Benign paroxysmal positional vertigo 07/25/2015   Preventative health care 07/06/2012   GERD (gastroesophageal reflux disease) 01/03/2012   Hyperglycemia 05/08/2011   General medical examination 05/06/2011   Gout 02/21/2010   Degenerative disc disease, lumbar 07/11/2009   Osteoarthritis 02/19/2009   Hyperlipidemia 01/16/2009   Obesity, Class III, BMI 40-49.9 (morbid obesity) ([place] 02/17/2008   Depression 09/04/2007   Essential hypertension 09/04/2007    JPercival Spanish PT 02/26/2022, 1:29 PM  [place] [address] [place] [address]Phone: [phone]  Fax:  [phone] Name: DDANITRA PAYANOMRN: [id number] of Birth: [birth year]

## 2022-02-26 NOTE — Patient Instructions (Signed)
? ?  Access Code: [email or web address] ?URL: [email or web address] ?Date: 02/26/2022 ?Prepared by: Annie Paras ? ?Exercises ?Seated Cervical Retraction - 2 x daily - 7 x weekly - 2 sets - 10 reps - 3-5 sec hold ?Seated Gentle Upper Trapezius Stretch - 2 x daily - 7 x weekly - 3 reps - 30 sec hold ?Gentle Levator Scapulae Stretch - 2 x daily - 7 x weekly - 3 reps - 30 sec hold ?Seated Scapular Retraction - 2 x daily - 7 x weekly - 2 sets - 10 reps - 5 sec hold ?Hooklying Single Knee to Chest Stretch with Towel - 2 x daily - 7 x weekly - 3 reps - 30 sec hold ?Supine Piriformis Stretch with Foot on Ground - 2 x daily - 7 x weekly - 3 reps - 30 sec hold ?Supine Figure 4 Piriformis Stretch - 2 x daily - 7 x weekly - 3 reps - 30 sec hold ?Supine Lower Trunk Rotation - 2 x daily - 7 x weekly - 5 reps - 10 sec hold ?Supine Posterior Pelvic Tilt - 2 x daily - 7 x weekly - 2 sets - 10 reps - 5 sec hold ?Supine March with Posterior Pelvic Tilt - 2 x daily - 7 x weekly - 2 sets - 10 reps - 3 sec hold ?Bent Knee Fallouts with Alternating Legs - 2 x daily - 7 x weekly - 2 sets - 10 reps - 3 sec hold ?Seated Flexion Stretch with Swiss Ball - 2 x daily - 7 x weekly - 3 reps - 30 sec hold ?Seated Table Piriformis Stretch - 2-3 x daily - 7 x weekly - 3 reps - 30 sec hold ?Seated Piriformis Stretch - 2-3 x daily - 7 x weekly - 3 reps - 30 sec hold ?Seated Transversus Abdominis Bracing - 1 x daily - 7 x weekly - 2 sets - 10 reps - 3 sec hold ?Seated March with Resistance - 1 x daily - 3 x weekly - 2 sets - 10 reps - 3 sec hold ?Seated Isometric Hip Abduction with Resistance - 1 x daily - 3 x weekly - 2 sets - 10 reps - 3 sec hold ?Standing Shoulder Row with Anchored Resistance - 1 x daily - 3 x weekly - 2 sets - 10 reps - 5 sec hold ?Scapular Retraction with Resistance Advanced - 1 x daily - 3 x weekly - 2 sets - 10 reps - 5 sec hold ?Standing Shoulder Horizontal Abduction with Resistance - 1 x daily - 3 x weekly - 2 sets - 10 reps -  3 sec hold ?Standing Shoulder Diagonal Horizontal Abduction 60/120 Degrees with Resistance - 1 x daily - 3 x weekly - 2 sets - 10 reps - 3 sec hold ?Sternocleidomastoid Stretch - 2-3 x daily - 7 x weekly - 3 reps - 30 sec hold ?Seated Assisted Cervical Rotation with Towel - 1 x daily - 7 x weekly - 2 sets - 10 reps - 3 sec hold ?Mid-Lower Cervical Extension SNAG with Strap - 1 x daily - 7 x weekly - 2 sets - 10 reps - 3 sec hold ? ?Patient Education ?Posture and Body Mechanics ?Office Posture ?

## 2022-02-28 ENCOUNTER — Encounter: Payer: Self-pay | Admitting: Physical Therapy

## 2022-02-28 ENCOUNTER — Ambulatory Visit: Payer: PPO | Admitting: Physical Therapy

## 2022-02-28 ENCOUNTER — Other Ambulatory Visit: Payer: Self-pay

## 2022-02-28 DIAGNOSIS — R293 Abnormal posture: Secondary | ICD-10-CM

## 2022-02-28 DIAGNOSIS — M5412 Radiculopathy, cervical region: Secondary | ICD-10-CM

## 2022-02-28 DIAGNOSIS — M62830 Muscle spasm of back: Secondary | ICD-10-CM

## 2022-02-28 DIAGNOSIS — M5416 Radiculopathy, lumbar region: Secondary | ICD-10-CM

## 2022-02-28 DIAGNOSIS — M545 Low back pain, unspecified: Secondary | ICD-10-CM | POA: Diagnosis not present

## 2022-02-28 DIAGNOSIS — M6281 Muscle weakness (generalized): Secondary | ICD-10-CM

## 2022-02-28 DIAGNOSIS — M542 Cervicalgia: Secondary | ICD-10-CM

## 2022-02-28 NOTE — Therapy (Signed)
[place] [address]  [place], [place], [address] Phone: [phone]   Fax:  [phone]  Physical Therapy Treatment  Patient Details  Name: Jordan [name] MRN: [id number] Date of Birth: 11/20/1951 Referring Provider (PT): Kristeen Miss, MD   Encounter Date: 02/28/2022   PT End of Session - 02/28/22 0932     Visit Number 8    Number of Visits 12    Date for PT Re-Evaluation 03/12/22    Authorization Type [employer]    PT Start Time 0932    PT Stop Time 1017    PT Time Calculation (min) 45 min    Activity Tolerance Patient tolerated treatment well    Behavior During Therapy [facility] for tasks assessed/performed             Past Medical History:  Diagnosis Date   Cellulitis and abscess of finger, unspecified 401.9   Cellulitis and abscess of foot, except toes    Depressive disorder, not elsewhere classified    GERD (gastroesophageal reflux disease)    Gout, unspecified    History of tobacco abuse 12/01/2018   Hyperglycemia 05/08/2011   Hyperlipidemia    borderline- not on meds   Hypertension    Insomnia    Leg edema    chronic   Obesity, unspecified    Osteoarthritis    Renal insufficiency, mild 05/08/2011   Small bowel obstruction ([place])     Past Surgical History:  Procedure Laterality Date   ABDOMINAL HYSTERECTOMY  2002   infection- no history of cancer   APPENDECTOMY  2002   catacact Right 11/01/2019   CHOLECYSTECTOMY  1977   ECTOPIC PREGNANCY SURGERY  1971   right shoulder rotator cuff repair  06-07-10   TOTAL HIP ARTHROPLASTY Left 1998    There were no vitals filed for this visit.   Subjective Assessment - 02/28/22 0932     Subjective Pt denies pain today, but feels like her neck is bothering her more than her back recently. Does still note some issues with sciatica.    Pertinent History L THA 20 yrs ago; R RCR    Diagnostic tests 11/24/2021  - Lumbar MRI: 1. Multilevel spinal  degeneration with hyperlordosis, scoliosis, and  L4-5, L5-S1 anterolisthesis.  2. Compressive spinal stenosis at L2-3 and L4-5.  3. Right-sided foraminal impingement at L1-2, L2-3 and L4-5. Left foraminal impingement at T12-L1 and especially L4-5.   Cervical MRI: 1. Cervical spine degeneration with generalized progression since 2007. Mild C3-4 anterolisthesis.  2. Marked active facet arthritis on the left at C2-3. Discogenic edema leftward at C4-5.  3. C4-5 left paracentral to foraminal herniation flattening the left cord and causing advanced left foraminal stenosis.  4. C5-6 left foraminal impingement.  5. C6-7 central herniation indenting the ventral cord.    Patient Stated Goals "not be in pain and do what I like to do"    Currently in Pain? No/denies                               [facility] Adult PT Treatment/Exercise - 02/28/22 0932       Neck Exercises: Theraband   Shoulder Extension 15 reps;Green    Shoulder Extension Limitations cues for scap retraction & TrA isometric   standing   Rows 15 reps;Green    Rows Limitations cues for scap retraction & TrA isometric   standing   Horizontal  ABduction 10 reps;Green    Horizontal ABduction Limitations cues for scap retraction & TrA isometric   standing with back along doorframe   Other Theraband Exercises Alt green TB UE diagonals x 10, cues for scap retraction & TrA isometric   standing with back along doorframe     Neck Exercises: Stretches   Upper Trapezius Stretch Right;2 reps;30 seconds    Upper Trapezius Stretch Limitations clarification for increased hold time to allow for better muscle relaxation    Levator Stretch Right;2 reps;30 seconds    Levator Stretch Limitations clarification for increased hold time to allow for better muscle relaxation      Lumbar Exercises: Stretches   Piriformis Stretch Right;Left;2 reps;30 seconds    Piriformis Stretch Limitations seated KTOS - elevated foot on stool or chair rather than opp  knee for better tolerance    Figure 4 Stretch 2 reps;30 seconds;Seated    Figure 4 Stretch Limitations side-sitting hip hinge      Lumbar Exercises: Aerobic   UBE (Upper Arm Bike) L2.0 x 6 min (3 min each fwd & back)                     PT Education - 02/28/22 1004     Education Details HEP reviewed and streamlined with majority of exercises at seated or standing level per pt preference    Person(s) Educated Patient    Methods Explanation;Handout    Comprehension Verbalized understanding              PT Short Term Goals - 02/14/22 0928       PT SHORT TERM GOAL #1   Title Patient will be independent with initial HEP    Status Achieved   02/14/22     PT SHORT TERM GOAL #2   Title Patient will verbalize/demonstrate good awareness of neutral spine posture and proper body mechanics for daily tasks    Status Achieved   02/14/22              PT Long Term Goals - 02/26/22 0943       PT LONG TERM GOAL #1   Title Patient will be independent with ongoing/advanced HEP for self-management at home    Status Partially Met   02/18/22 - Pt reports seated exercises are working better for her   Target Date 03/12/22      PT LONG TERM GOAL #2   Title Patient to demonstrate ability to achieve and maintain good spinal alignment/posturing and body mechanics needed for daily activities    Status Partially Met   02/26/22 - Pt reports better awareness of her posture but still has to remind herself.   Target Date 03/12/22      PT LONG TERM GOAL #3   Title Patient to report reduction in frequency and intensity of neck and low back pain +/- radiculopathy by >/= 50% to allow for improved activity tolerance    Status Achieved   02/26/22 - Pt reports 70-75% reduction in neck and back pain, and 99% reduction in LE radiculopathy     PT LONG TERM GOAL #4   Title Patient to report >/=50% reduction in frequency and intensity of headaches and cervicogenic dizziness/nausea    Status Achieved    02/26/22 - Pt reports 60% reduction in frequency of headaches and no longer having the rams horn distribution     PT LONG TERM GOAL #5   Title Patient to improve cervical and lumbar AROM to [facility]  without pain provocation    Status On-going    Target Date 03/12/22      PT LONG TERM GOAL #6   Title Patient will demonstrate improved B proximal UE and LE strength to >/= 4/5 for improved stability and ease of mobility as well as functional UE use    Status On-going    Target Date 03/12/22      PT LONG TERM GOAL #7   Title Patient to report ability to perform ADLs, household, leisure and work-related tasks without increased neck or low back pain or radiculopathy    Status Partially Met   02/26/22 - Pt reports overall improvement but still notes limited tolerance for sitting or walking long periods   Target Date 03/12/22                   Plan - 02/28/22 1017     Clinical Impression Statement Jordan [name] reports her overall pain has been better with no pain currently but notes her neck remains more problematic than her back. We reviewed her HEP, eliminating most of the supine exercises in favor of seated and standing exercises per pt preference and clarifying hold times, reps, sets and weekly frequency - full revised HEP reprinted along with tracking log. Progressed resistance with UB strengthening to green TB with band provided for home use. Jordan [name] feels like she might be ready to transition to her HEP as of our next visit, therefore will plan for goal assessment and anticipate transition to HEP + 30-day hold.    Comorbidities HTN, OA, L THA, R RCR, GERD, BPPV, obesity, chronic pain syndrome, anxiety, depression    Rehab Potential Good    PT Frequency 2x / week    PT Duration 6 weeks    PT Treatment/Interventions ADLs/Self Care Home Management;Cryotherapy;Electrical Stimulation;Iontophoresis 58m/ml Dexamethasone;Moist Heat;Traction;Gait training;Stair training;Functional mobility  training;Therapeutic activities;Therapeutic exercise;Balance training;Neuromuscular re-education;Patient/family education;Manual techniques;Passive range of motion;Dry needling;Taping;Spinal Manipulations    PT Next Visit Plan goal assessment with anticipated transition to HEP + 30-day hold    PT Home Exercise Plan Access Code: [email or web address](2/10, updated 2/15, 2/23, 2/27, 3/7 & 3/9)    Consulted and Agree with Plan of Care Patient             Patient will benefit from skilled therapeutic intervention in order to improve the following deficits and impairments:  Abnormal gait, Decreased activity tolerance, Decreased balance, Decreased endurance, Decreased knowledge of precautions, Decreased mobility, Decreased range of motion, Decreased safety awareness, Decreased strength, Difficulty walking, Increased fascial restricitons, Increased muscle spasms, Impaired perceived functional ability, Impaired flexibility, Impaired sensation, Impaired UE functional use, Improper body mechanics, Postural dysfunction, Obesity, Pain  Visit Diagnosis: Chronic left-sided low back pain, unspecified whether sciatica present  Radiculopathy, lumbar region  Cervicalgia  Radiculopathy, cervical region  Abnormal posture  Muscle weakness (generalized)  Muscle spasm of back     Problem List Patient Active Problem List   Diagnosis Date Noted   Lumbar radiculopathy 11/09/2021   Cervical radiculopathy 11/09/2021   COVID-19 virus infection 08/08/2021   Chronic pain syndrome 06/26/2021   Morbidly obese ([place] 10/09/2020   Acute hypoxemic respiratory failure due to COVID-19 ([place] 10/08/2020   SBO (small bowel obstruction) ([place] 12/01/2018   Hypokalemia 12/01/2018   History of tobacco abuse 12/01/2018   Benign paroxysmal positional vertigo 07/25/2015   Preventative health care 07/06/2012   GERD (gastroesophageal reflux disease) 01/03/2012   Hyperglycemia 05/08/2011   General medical examination 05/06/2011    Gout 02/21/2010  Degenerative disc disease, lumbar 07/11/2009   Osteoarthritis 02/19/2009   Hyperlipidemia 01/16/2009   Obesity, Class III, BMI 40-49.9 (morbid obesity) ([place]) 02/17/2008   Depression 09/04/2007   Essential hypertension 09/04/2007    Percival Spanish, PT 02/28/2022, 12:07 PM  [facility] [address]  [place], [place], [address] Phone: [phone]   Fax:  [phone]  Name: Jordan [name] MRN: [id number] Date of Birth: 1951-02-22

## 2022-02-28 NOTE — Patient Instructions (Signed)
Access Code: [email or web address] ?URL: [email or web address] ?Date: 02/28/2022 ?Prepared by: Annie Paras ? ?Exercises ?Seated Cervical Retraction - 2 x daily - 7 x weekly - 2 sets - 10 reps - 3-5 sec hold ?Seated Gentle Upper Trapezius Stretch - 2 x daily - 7 x weekly - 3 reps - 30 sec hold ?Gentle Levator Scapulae Stretch - 2 x daily - 7 x weekly - 3 reps - 30 sec hold ?Sternocleidomastoid Stretch - 2 x daily - 7 x weekly - 3 reps - 30 sec hold ?Seated Assisted Cervical Rotation with Towel - 1 x daily - 7 x weekly - 2 sets - 10 reps - 3 sec hold ?Mid-Lower Cervical Extension SNAG with Strap - 1 x daily - 7 x weekly - 2 sets - 10 reps - 3 sec hold ?Seated Scapular Retraction - 2 x daily - 7 x weekly - 2 sets - 10 reps - 5 sec hold ?Supine Lower Trunk Rotation - 1-2 x daily - 7 x weekly - 5 reps - 10 sec hold ?Seated Flexion Stretch with Swiss Ball - 1-2 x daily - 7 x weekly - 3 reps - 30 sec hold ?Seated Thoracic Flexion and Rotation with Swiss Ball - 1-2 x daily - 7 x weekly - 3 reps - 30 sec hold ?Seated Single Knee to Chest - 1-2 x daily - 7 x weekly - 2 sets - 10 reps - 3 sec hold ?Seated Table Piriformis Stretch - 1-2 x daily - 7 x weekly - 3 reps - 30 sec hold ?Seated Piriformis Stretch - 1-2 x daily - 7 x weekly - 3 reps - 30 sec hold ?Seated Transversus Abdominis Bracing - 1 x daily - 7 x weekly - 2 sets - 10 reps - 3 sec hold ?Seated March with Resistance - 1 x daily - 3 x weekly - 2 sets - 10 reps - 3 sec hold ?Seated Isometric Hip Abduction with Resistance - 1 x daily - 3 x weekly - 2 sets - 10 reps - 3 sec hold ?Standing Shoulder Row with Anchored Resistance - 1 x daily - 3 x weekly - 2 sets - 10 reps - 5 sec hold ?Scapular Retraction with Resistance Advanced - 1 x daily - 3 x weekly - 2 sets - 10 reps - 5 sec hold ?Standing Shoulder Horizontal Abduction with Resistance - 1 x daily - 3 x weekly - 2 sets - 10 reps - 3 sec hold ?Standing Shoulder Diagonal Horizontal Abduction 60/120 Degrees with  Resistance - 1 x daily - 3 x weekly - 2 sets - 10 reps - 3 sec hold ? ?Patient Education ?Posture and Body Mechanics ?Office Posture ?

## 2022-03-01 ENCOUNTER — Other Ambulatory Visit: Payer: Self-pay | Admitting: Family

## 2022-03-01 NOTE — Telephone Encounter (Signed)
Refill request for tramadol- no longer on med list. Please advise?  

## 2022-03-03 MED ORDER — TRAMADOL HCL 50 MG PO TABS
50.0000 mg | ORAL_TABLET | Freq: Three times a day (TID) | ORAL | 0 refills | Status: DC | PRN
  Filled 2022-03-03: qty 30, 10d supply, fill #0

## 2022-03-04 ENCOUNTER — Other Ambulatory Visit (HOSPITAL_BASED_OUTPATIENT_CLINIC_OR_DEPARTMENT_OTHER): Payer: Self-pay

## 2022-03-05 ENCOUNTER — Encounter: Payer: Self-pay | Admitting: Physical Therapy

## 2022-03-05 ENCOUNTER — Ambulatory Visit: Payer: PPO | Admitting: Physical Therapy

## 2022-03-05 ENCOUNTER — Other Ambulatory Visit: Payer: Self-pay

## 2022-03-05 DIAGNOSIS — M6281 Muscle weakness (generalized): Secondary | ICD-10-CM

## 2022-03-05 DIAGNOSIS — M545 Low back pain, unspecified: Secondary | ICD-10-CM | POA: Diagnosis not present

## 2022-03-05 DIAGNOSIS — M5416 Radiculopathy, lumbar region: Secondary | ICD-10-CM

## 2022-03-05 DIAGNOSIS — M542 Cervicalgia: Secondary | ICD-10-CM

## 2022-03-05 DIAGNOSIS — M5412 Radiculopathy, cervical region: Secondary | ICD-10-CM

## 2022-03-05 DIAGNOSIS — R293 Abnormal posture: Secondary | ICD-10-CM

## 2022-03-05 DIAGNOSIS — M62830 Muscle spasm of back: Secondary | ICD-10-CM

## 2022-03-05 NOTE — Therapy (Addendum)
PHYSICAL THERAPY DISCHARGE SUMMARY  Visits from Start of Care: 9  Current functional level related to goals / functional outcomes: From note below "Jordan [name] is very pleased with her progress noting low back and neck pain 80-85% improved and radicular symptoms 99% improved. She reports 60% reduction in her headache frequency and intensity and thinks that some of her ongoing headache issues may be vision related and hence plans to schedule a vision check-up. Her cervical and lumbar AROM are now WFL/WNL and her overall B UE and LE strength is now 4/5 to 5/5. She reports she is able to complete all daily tasks w/o limitation due to pain but does find that she still needs to perform tasks in increments with rest breaks. All goals now met. Jordan [name] feels confident with readiness to transition to her HEP and denies need for further review, but would like to remain on hold for 30 days in the event that issues arise that would necessitate a return to PT. "   Remaining deficits: See below   Education / Equipment: HEP  Plan: Patient agrees to discharge.  Patient is being discharged due to meeting the stated rehab goals.  Patient was placed on 30 day hold on 03/05/2022 and has not returned within that time frame.    Jordan [name], PT, DPT 1:51 PM 05/28/2022   [facility] [address]  [place], [place], [address] Phone: [phone]   Fax:  [phone]  Physical Therapy Treatment / Progress Note  Patient Details  Name: Jordan [name] MRN: [id number] Date of Birth: 11-Jun-1951 Referring Provider (PT): Kristeen Miss, MD   Progress Note  Reporting Period 01/29/2022 to 03/05/2022  See note below for Objective Data and Assessment of Progress/Goals.     Encounter Date: 03/05/2022   PT End of Session - 03/05/22 0935     Visit Number 9    Number of Visits 12    Date for PT Re-Evaluation 03/12/22    Authorization Type [employer]     PT Start Time 0935    PT Stop Time 1013    PT Time Calculation (min) 38 min    Activity Tolerance Patient tolerated treatment well    Behavior During Therapy WFL for tasks assessed/performed             Past Medical History:  Diagnosis Date   Cellulitis and abscess of finger, unspecified 401.9   Cellulitis and abscess of foot, except toes    Depressive disorder, not elsewhere classified    GERD (gastroesophageal reflux disease)    Gout, unspecified    History of tobacco abuse 12/01/2018   Hyperglycemia 05/08/2011   Hyperlipidemia    borderline- not on meds   Hypertension    Insomnia    Leg edema    chronic   Obesity, unspecified    Osteoarthritis    Renal insufficiency, mild 05/08/2011   Small bowel obstruction ([place])     Past Surgical History:  Procedure Laterality Date   ABDOMINAL HYSTERECTOMY  2002   infection- no history of cancer   APPENDECTOMY  2002   catacact Right 11/01/2019   CHOLECYSTECTOMY  1977   ECTOPIC PREGNANCY SURGERY  1971   right shoulder rotator cuff repair  06-07-10   TOTAL HIP ARTHROPLASTY Left 1998    There were no vitals filed for this visit.   Subjective Assessment - 03/05/22 0942     Subjective Pt reports she is "totally amazed" with her  progress with PT with no pain reported today.    Pertinent History L THA 20 yrs ago; R RCR    Diagnostic tests 11/24/2021  - Lumbar MRI: 1. Multilevel spinal degeneration with hyperlordosis, scoliosis, and  L4-5, L5-S1 anterolisthesis.  2. Compressive spinal stenosis at L2-3 and L4-5.  3. Right-sided foraminal impingement at L1-2, L2-3 and L4-5. Left foraminal impingement at T12-L1 and especially L4-5.   Cervical MRI: 1. Cervical spine degeneration with generalized progression since 2007. Mild C3-4 anterolisthesis.  2. Marked active facet arthritis on the left at C2-3. Discogenic edema leftward at C4-5.  3. C4-5 left paracentral to foraminal herniation flattening the left cord and causing advanced left foraminal  stenosis.  4. C5-6 left foraminal impingement.  5. C6-7 central herniation indenting the ventral cord.    Patient Stated Goals "not be in pain and do what I like to do"    Currently in Pain? 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    Referring Provider (PT) Kristeen Miss, MD    Onset Date/Surgical Date --   chronic for both with exacerbation as of Dec 2022 for back and ~6 months for neck   Next MD Visit 04/24/22      Observation/Other Assessments   Focus on Therapeutic Outcomes (FOTO)  Lumbar = 61; acheiving predicted D/C FS = 61      AROM   Cervical Flexion 48    Cervical Extension 60    Cervical - Right Side Bend 36    Cervical - Left Side Bend 38    Cervical - Right Rotation 72    Cervical - Left Rotation 68    Lumbar Flexion fingertips to foor - WNL    Lumbar Extension WFL    Lumbar - Right Side Bend WNL    Lumbar - Left Side Bend WNL    Lumbar - Right Rotation WFL    Lumbar - Left Rotation WNL      Strength   Right Shoulder Flexion 4/5    Right Shoulder ABduction 4/5    Right Shoulder Internal Rotation 5/5    Right Shoulder External Rotation 4+/5    Left Shoulder Flexion 4/5    Left Shoulder ABduction 4+/5    Left Shoulder Internal Rotation 5/5    Left Shoulder External Rotation 4+/5    Right Hip Flexion 4+/5    Right Hip Extension 4+/5    Right Hip External Rotation  4+/5    Right Hip Internal Rotation 5/5    Right Hip ABduction 4+/5    Right Hip ADduction 4+/5    Left Hip Flexion 4/5   limited lift   Left Hip Extension 4+/5    Left Hip External Rotation 4/5   limited ROM   Left Hip Internal Rotation 4+/5    Left Hip ABduction 4/5   mild pain   Left Hip ADduction 4+/5    Right Knee Flexion 5/5    Right Knee Extension 5/5    Left Knee Flexion 5/5    Left Knee Extension 5/5    Right Ankle Dorsiflexion 5/5    Left Ankle Dorsiflexion  5/5                           [facility] Adult PT Treatment/Exercise -  03/05/22 0935       Lumbar Exercises: Aerobic   Nustep L6 x 6 min (UE/LE)                       PT Short Term Goals - 02/14/22 0928       PT SHORT TERM GOAL #1   Title Patient will be independent with initial HEP    Status Achieved   02/14/22     PT SHORT TERM GOAL #2   Title Patient will verbalize/demonstrate good awareness of neutral spine posture and proper body mechanics for daily tasks    Status Achieved   02/14/22              PT Long Term Goals - 03/05/22 0944       PT LONG TERM GOAL #1   Title Patient will be independent with ongoing/advanced HEP for self-management at home    Status Achieved   03/05/22     PT LONG TERM GOAL #2   Title Patient to demonstrate ability to achieve and maintain good spinal alignment/posturing and body mechanics needed for daily activities    Status Achieved   03/05/22     PT LONG TERM GOAL #3   Title Patient to report reduction in frequency and intensity of neck and low back pain +/- radiculopathy by >/= 50% to allow for improved activity tolerance    Status Achieved   03/05/22 - Pt reports 80-85% reduction in neck and back pain, and 99% reduction in LE radiculopathy     PT LONG TERM GOAL #4   Title Patient to report >/=50% reduction in frequency and intensity of headaches and cervicogenic dizziness/nausea    Status Achieved   02/26/22 - Pt reports 60% reduction in frequency of headaches and no longer having the rams horn distribution     PT LONG TERM GOAL #5   Title Patient to improve cervical and lumbar AROM to [facility] without pain provocation    Status Achieved   03/05/22     PT LONG TERM GOAL #6   Title Patient will demonstrate improved B proximal UE and LE strength to >/= 4/5 for improved stability and ease of mobility as well as functional UE use    Status Achieved   03/05/22     PT LONG TERM GOAL #7   Title Patient to report ability to  perform ADLs, household, leisure and work-related tasks without increased neck or low back pain or radiculopathy    Status Achieved   03/05/22 - able to do everything but has to do things in increments                  Plan - 03/05/22 1009     Clinical Impression Statement Jordan [name] is very pleased with her progress noting low back and neck pain 80-85% improved and radicular symptoms 99% improved. She reports 60% reduction in her headache frequency and intensity and thinks that some of her ongoing headache issues may be vision related and hence plans to schedule a vision check-up. Her cervical and lumbar AROM are now WFL/WNL and her overall B UE and LE strength is now 4/5 to 5/5. She reports she is able to complete all daily tasks w/o limitation due to pain but does find that she still needs to perform tasks in increments with rest breaks. All goals now met. Jordan [name] feels confident with readiness to transition to her HEP and denies need for further review, but would  like to remain on hold for 30 days in the event that issues arise that would necessitate a return to PT.    Comorbidities HTN, OA, L THA, R RCR, GERD, BPPV, obesity, chronic pain syndrome, anxiety, depression    PT Treatment/Interventions ADLs/Self Care Home Management;Cryotherapy;Electrical Stimulation;Iontophoresis 30m/ml Dexamethasone;Moist Heat;Traction;Gait training;Stair training;Functional mobility training;Therapeutic activities;Therapeutic exercise;Balance training;Neuromuscular re-education;Patient/family education;Manual techniques;Passive range of motion;Dry needling;Taping;Spinal Manipulations    PT Next Visit Plan transition to HEP + 30-day hold    PT Home Exercise Plan Access Code: [email or web address](2/10, updated 2/15, 2/23, 2/27, 3/7 & 3/9)    Consulted and Agree with Plan of Care Patient             Patient will benefit from skilled therapeutic intervention in order to improve the following deficits and impairments:   Abnormal gait, Decreased activity tolerance, Decreased balance, Decreased endurance, Decreased knowledge of precautions, Decreased mobility, Decreased range of motion, Decreased safety awareness, Decreased strength, Difficulty walking, Increased fascial restricitons, Increased muscle spasms, Impaired perceived functional ability, Impaired flexibility, Impaired sensation, Impaired UE functional use, Improper body mechanics, Postural dysfunction, Obesity, Pain  Visit Diagnosis: Chronic left-sided low back pain, unspecified whether sciatica present  Radiculopathy, lumbar region  Cervicalgia  Radiculopathy, cervical region  Abnormal posture  Muscle weakness (generalized)  Muscle spasm of back     Problem List Patient Active Problem List   Diagnosis Date Noted   Lumbar radiculopathy 11/09/2021   Cervical radiculopathy 11/09/2021   COVID-19 virus infection 08/08/2021   Chronic pain syndrome 06/26/2021   Morbidly obese ([place] 10/09/2020   Acute hypoxemic respiratory failure due to COVID-19 ([place] 10/08/2020   SBO (small bowel obstruction) ([place] 12/01/2018   Hypokalemia 12/01/2018   History of tobacco abuse 12/01/2018   Benign paroxysmal positional vertigo 07/25/2015   Preventative health care 07/06/2012   GERD (gastroesophageal reflux disease) 01/03/2012   Hyperglycemia 05/08/2011   General medical examination 05/06/2011   Gout 02/21/2010   Degenerative disc disease, lumbar 07/11/2009   Osteoarthritis 02/19/2009   Hyperlipidemia 01/16/2009   Obesity, Class III, BMI 40-49.9 (morbid obesity) ([place] 02/17/2008   Depression 09/04/2007   Essential hypertension 09/04/2007    Jordan [name] PT 03/05/2022, 12:28 PM  [facility] [address] [place] [address]Phone: [phone]  Fax:  [phone] Name: DJAMILEX BOHNSACKMRN: [id number] of Birth: 1Feb 15, 1952

## 2022-03-08 ENCOUNTER — Ambulatory Visit (INDEPENDENT_AMBULATORY_CARE_PROVIDER_SITE_OTHER): Payer: PPO | Admitting: Family

## 2022-03-08 ENCOUNTER — Encounter: Payer: Self-pay | Admitting: Family

## 2022-03-08 ENCOUNTER — Other Ambulatory Visit (HOSPITAL_BASED_OUTPATIENT_CLINIC_OR_DEPARTMENT_OTHER): Payer: Self-pay

## 2022-03-08 ENCOUNTER — Telehealth: Payer: Self-pay | Admitting: Family

## 2022-03-08 DIAGNOSIS — I10 Essential (primary) hypertension: Secondary | ICD-10-CM

## 2022-03-08 DIAGNOSIS — G894 Chronic pain syndrome: Secondary | ICD-10-CM | POA: Diagnosis not present

## 2022-03-08 DIAGNOSIS — E119 Type 2 diabetes mellitus without complications: Secondary | ICD-10-CM | POA: Diagnosis not present

## 2022-03-08 DIAGNOSIS — F320 Major depressive disorder, single episode, mild: Secondary | ICD-10-CM | POA: Diagnosis not present

## 2022-03-08 DIAGNOSIS — M109 Gout, unspecified: Secondary | ICD-10-CM | POA: Diagnosis not present

## 2022-03-08 LAB — HEMOGLOBIN A1C: Hgb A1c MFr Bld: 6.9 % — ABNORMAL HIGH (ref 4.6–6.5)

## 2022-03-08 MED ORDER — TRIAMTERENE-HCTZ 37.5-25 MG PO CAPS
1.0000 | ORAL_CAPSULE | Freq: Every day | ORAL | 1 refills | Status: DC
  Filled 2022-03-08: qty 90, 90d supply, fill #0
  Filled 2022-06-09: qty 90, 90d supply, fill #1

## 2022-03-08 NOTE — Assessment & Plan Note (Addendum)
BP Readings from Last 3 Encounters:  ?03/08/22 (!) 161/83  ?12/07/21 136/66  ?11/09/21 (!) 162/88  ? ?Maintained on hctz.  Uncontrolled. D/C hctz and will begin dyazide for bp control and c/o LE edema.  ?

## 2022-03-08 NOTE — Progress Notes (Signed)
? ?Subjective:  ? ?By signing my name below, I, [name], attest that this documentation has been prepared under the direction and in the presence of [name] NP, 03/08/2022  ? ? Patient ID: Jordan [name], female    DOB: [birth year], 71 y.o.   MRN: [id number] ? ?Chief Complaint  ?Patient presents with  ? Diabetes  ?  Here for follow up  ? Hypertension  ?  Here for follow up  ? ? ?HPI ? ?Patient is in today for an office visit. ? ?Depression - She complains of worsening depression symptoms. She's been dealing with familial issues as of last year. She has not been able to see her great grandchildren. The situation has brought up memories of past experiences which worsens her mood. She uses religion to help her symptoms. She is still taking 60 MG of Cymbalta.  ? ?Edema - She has been taking 40 MG of Lasix due to decrease the frequency of swelling in her lower extremities.  ? ?Blood Pressure - She has been using 25 MG of Hydrodiuril. As of today's visit her blood pressure reading is 160/95. ?BP Readings from Last 3 Encounters:  ?03/08/22 (!) 161/83  ?12/07/21 136/66  ?11/09/21 (!) 162/88  ? ?Blood Sugar - Her sugar levels have hit the diabetes mark. ?Lab Results  ?Component Value Date  ? HGBA1C 6.5 06/26/2021  ? ?Gout - Her gout symptoms have been well. She is continuing to take 100 MG of Zyloprim. ? ?Tramadol - She occasionally takes Tramadol for her Leg Pain/Back Pain.  ? ?Vaccination - She has not taken the COVID - 19 bivalent booster vaccine  ? ?Vision - She is not up to date with her vision exams. She has been getting occasional nausea and headaches which she believes could be due to her eyes.  ? ?Health Maintenance Due  ?Topic Date Due  ? OPHTHALMOLOGY EXAM  08/23/2016  ? COVID-19 Vaccine (3 - Pfizer risk series) 09/13/2020  ? HEMOGLOBIN A1C  12/27/2021  ? ? ?Past Medical History:  ?Diagnosis Date  ? Acute hypoxemic respiratory failure due to COVID-19 [facility]) 10/08/2020  ? Cellulitis and abscess of  finger, unspecified 401.9  ? Cellulitis and abscess of foot, except toes   ? Depressive disorder, not elsewhere classified   ? GERD (gastroesophageal reflux disease)   ? Gout, unspecified   ? History of tobacco abuse 12/01/2018  ? Hyperglycemia 05/08/2011  ? Hyperlipidemia   ? borderline- not on meds  ? Hypertension   ? Insomnia   ? Leg edema   ? chronic  ? Obesity, unspecified   ? Osteoarthritis   ? Renal insufficiency, mild 05/08/2011  ? SBO (small bowel obstruction) ([place]) 12/01/2018  ? Small bowel obstruction ([place])   ? ? ?Past Surgical History:  ?Procedure Laterality Date  ? ABDOMINAL HYSTERECTOMY  2002  ? infection- no history of cancer  ? APPENDECTOMY  2002  ? catacact Right 11/01/2019  ? CHOLECYSTECTOMY  1977  ? ECTOPIC PREGNANCY SURGERY  1971  ? right shoulder rotator cuff repair  06-07-10  ? TOTAL HIP ARTHROPLASTY Left 1998  ? ? ?Family History  ?Problem Relation Age of Onset  ? Diabetes Mother   ? Cancer Mother   ?     colon/ pancreatic  ? Colon cancer Mother 12  ? Hypertension Father   ? Alcohol abuse Father   ? Cirrhosis Father   ? Other Sister   ?     Pyeoderma gangrenosis  ? Diabetes Sister   ?  type 2  ? Kidney failure Sister   ? Liver disease Sister   ? Cirrhosis Sister   ?     had NASH, died heart failure  ? Heart attack Brother   ? Stomach cancer Maternal Grandfather   ? Obesity Daughter   ? Rectal cancer Neg Hx   ? Esophageal cancer Neg Hx   ? ? ?Social History  ? ?Socioeconomic History  ? Marital status: Divorced  ?  Spouse name: Not on file  ? Number of children: 2  ? Years of education: Not on file  ? Highest education level: Not on file  ?Occupational History  ? Occupation: works prn at [employer]  ?  Employer: [employer]  ?Tobacco Use  ? Smoking status: Former  ?  Packs/day: 0.25  ?  Types: Cigarettes  ?  Quit date: 09/25/2020  ?  Years since quitting: 1.4  ? Smokeless tobacco: Never  ?Vaping Use  ? Vaping Use: Never used  ?Substance and Sexual Activity  ? Alcohol use: Yes  ?  Alcohol/week: 0.0  standard drinks  ?  Comment: rare  ? Drug use: No  ? Sexual activity: Not Currently  ?Other Topics Concern  ? Not on file  ?Social History Narrative  ? 2 children (daughter and son) both local. 3 grandchildren, one great grandchild  ? Works at ED front desk  ? Divorced (married x 30 years)  ? ?Social Determinants of Health  ? ?Financial Resource Strain: Low Risk   ? Difficulty of Paying Living Expenses: Not hard at all  ?Food Insecurity: No Food Insecurity  ? Worried About Charity fundraiser in the Last Year: Never true  ? Ran Out of Food in the Last Year: Never true  ?Transportation Needs: No Transportation Needs  ? Lack of Transportation (Medical): No  ? Lack of Transportation (Non-Medical): No  ?Physical Activity: Insufficiently Active  ? Days of Exercise per Week: 3 days  ? Minutes of Exercise per Session: 30 min  ?Stress: No Stress Concern Present  ? Feeling of Stress : Not at all  ?Social Connections: Moderately Isolated  ? Frequency of Communication with Friends and Family: More than three times a week  ? Frequency of Social Gatherings with Friends and Family: More than three times a week  ? Attends Religious Services: More than 4 times per year  ? Active Member of Clubs or Organizations: No  ? Attends Archivist Meetings: Never  ? Marital Status: Divorced  ?Intimate Partner Violence: Not At Risk  ? Fear of Current or Ex-Partner: No  ? Emotionally Abused: No  ? Physically Abused: No  ? Sexually Abused: No  ? ? ?Outpatient Medications Prior to Visit  ?Medication Sig Dispense Refill  ? allopurinol (ZYLOPRIM) 100 MG tablet Take 2 tablets (200 mg total) by mouth daily. 180 tablet 0  ? aspirin EC 81 MG tablet Take 81 mg by mouth daily.    ? Cholecalciferol 25 MCG (1000 UT) capsule Take 1,000 Units by mouth daily.    ? DULoxetine (CYMBALTA) 60 MG capsule Take 1 capsule (60 mg total) by mouth daily. 90 capsule 0  ? furosemide (LASIX) 40 MG tablet TAKE 1 TABLET BY MOUTH ONCE DAILY AS NEEDED 90 tablet 1  ?  Multiple Vitamin (MULTIVITAMIN WITH MINERALS) TABS tablet Take 1 tablet by mouth daily.    ? traMADol (ULTRAM) 50 MG tablet Take 1 tablet (50 mg total) by mouth every 8 (eight) hours as needed. 30 tablet 0  ?  hydrochlorothiazide (HYDRODIURIL) 25 MG tablet Take 1 tablet (25 mg total) by mouth daily. 90 tablet 0  ? influenza vaccine adjuvanted (FLUAD QUADRIVALENT) 0.5 ML injection Inject into the muscle. 0.5 mL 0  ? Zoster Vaccine Adjuvanted [facility]) injection Inject into the muscle. 0.5 mL 0  ? methylPREDNISolone (MEDROL DOSEPAK) 4 MG TBPK tablet Take 6 tablets by mouth on day 1. 5 tablets on day 2, 4 tablets on day 3, 3 tablets on day 4, 2 tablets on day 5, and 1 tablet on day 6 21 tablet 0  ? nystatin powder Apply 1 application on to the skin 3 (three) times daily. 15 g 0  ? pantoprazole (PROTONIX) 40 MG tablet TAKE 1 TABLET BY MOUTH ONCE DAILY 90 tablet 1  ? ?No facility-administered medications prior to visit.  ? ? ?No Known Allergies ? ?Review of Systems  ?Gastrointestinal:  Positive for nausea.  ?Musculoskeletal:   ?     (+) Edema lower extremities  ?Neurological:  Positive for headaches.  ?Psychiatric/Behavioral:  Positive for depression.   ? ?   ?Objective:  ?  ?Physical Exam ?Constitutional:   ?   General: She is not in acute distress. ?   Appearance: Normal appearance. She is not ill-appearing.  ?HENT:  ?   Head: Normocephalic and atraumatic.  ?   Right Ear: External ear normal.  ?   Left Ear: External ear normal.  ?Eyes:  ?   Extraocular Movements: Extraocular movements intact.  ?   Pupils: Pupils are equal, round, and reactive to light.  ?Cardiovascular:  ?   Rate and Rhythm: Normal rate and regular rhythm.  ?   Heart sounds: Normal heart sounds. No murmur heard. ?  No gallop.  ?Pulmonary:  ?   Effort: Pulmonary effort is normal. No respiratory distress.  ?   Breath sounds: Normal breath sounds. No wheezing or rales.  ?Skin: ?   General: Skin is warm and dry.  ?Neurological:  ?   Mental Status: She is  alert and oriented to person, place, and time.  ?Psychiatric:     ?   Behavior: Behavior normal.     ?   Judgment: Judgment normal.  ? ? ?BP (!) 161/83 (BP Location: Right Arm, Patient Position: Sitting, Cuff

## 2022-03-08 NOTE — Patient Instructions (Addendum)
Please schedule a follow up visit for diabetic eye exam.   ?Stop hctz. ?Start Dyazide.  ?Complete lab work prior to leaving. ?

## 2022-03-08 NOTE — Assessment & Plan Note (Signed)
Stable.  Continue tramadol prn.  ?

## 2022-03-08 NOTE — Telephone Encounter (Signed)
Opened in error

## 2022-03-08 NOTE — Assessment & Plan Note (Signed)
Stable on allopurinol

## 2022-03-08 NOTE — Assessment & Plan Note (Addendum)
Wt Readings from Last 3 Encounters:  ?03/08/22 268 lb (121.6 kg)  ?12/07/21 267 lb 3.2 oz (121.2 kg)  ?11/09/21 266 lb (120.7 kg)  ? ?Unchanged. Consider SGLT-2 if sugar is above goal.  ?

## 2022-03-08 NOTE — Assessment & Plan Note (Signed)
Uncontrolled.  Worsened by situational stressors. Recommended that she continue cymbalta. Recommended counseling.  ?

## 2022-03-08 NOTE — Assessment & Plan Note (Addendum)
Will repeat A1C.  Recommended that she schedule annual DM eye exam.  ?

## 2022-03-10 ENCOUNTER — Encounter: Payer: Self-pay | Admitting: Family

## 2022-03-20 ENCOUNTER — Ambulatory Visit (INDEPENDENT_AMBULATORY_CARE_PROVIDER_SITE_OTHER): Payer: PPO | Admitting: Family

## 2022-03-20 VITALS — BP 139/67 | HR 60 | Temp 98.0°F | Resp 16 | Wt 267.0 lb

## 2022-03-20 DIAGNOSIS — E119 Type 2 diabetes mellitus without complications: Secondary | ICD-10-CM | POA: Diagnosis not present

## 2022-03-20 DIAGNOSIS — I10 Essential (primary) hypertension: Secondary | ICD-10-CM

## 2022-03-20 DIAGNOSIS — F320 Major depressive disorder, single episode, mild: Secondary | ICD-10-CM

## 2022-03-20 LAB — BASIC METABOLIC PANEL
BUN: 22 mg/dL (ref 6–23)
CO2: 24 mEq/L (ref 19–32)
Calcium: 9.8 mg/dL (ref 8.4–10.5)
Chloride: 102 mEq/L (ref 96–112)
Creatinine, Ser: 0.96 mg/dL (ref 0.40–1.20)
GFR: 59.53 mL/min — ABNORMAL LOW (ref >60.00)
Glucose, Bld: 110 mg/dL — ABNORMAL HIGH (ref 70–99)
Potassium: 4.1 mEq/L (ref 3.5–5.1)
Sodium: 137 mEq/L (ref 135–145)

## 2022-03-20 NOTE — Patient Instructions (Signed)
Please complete lab work prior to leaving.   

## 2022-03-20 NOTE — Assessment & Plan Note (Addendum)
BP Readings from Last 3 Encounters:  ?03/20/22 139/67  ?03/08/22 (!) 161/83  ?12/07/21 136/66  ? ?BP is now at goal. Continue dyazide.  Obtain follow up bmet.  ?

## 2022-03-20 NOTE — Progress Notes (Signed)
? ?Subjective:  ? ?By signing my name below, I, Jordan [name], attest that this documentation has been prepared under the direction and in the presence of Jordan Alar, NP. 03/20/2022 ?  ? ? Patient ID: Jordan [name], female    DOB: [birth year], 71 y.o.   MRN: [id number] ? ?Chief Complaint  ?Patient presents with  ? Hypertension  ?  Here for follow up after medication change  ? ? ?HPI ?Patient is in today for a follow up visit.  ? ?Blood pressure- During last visit she was experiencing leg swelling and elevated blood pressure. Her hydrochlorothiazide was switched to 37.5-25 mg dyazide and she reports improvement in her leg swelling. She notes having swelling while traveling after her visit but found it resolved when she settled.  ?BP Readings from Last 3 Encounters:  ?03/20/22 139/67  ?03/08/22 (!) 161/83  ?12/07/21 136/66  ? ?Pulse Readings from Last 3 Encounters:  ?03/20/22 60  ?03/08/22 77  ?12/07/21 83  ? ?Blood sugar- Her last a1c was slightly elevated from normal.  ?Lab Results  ?Component Value Date  ? HGBA1C 6.9 (H) 03/08/2022  ? ?Irritability- She reports feeling more irritability than depression recently. She thinks it is due her back pain. She reports her nephew passed away recently. She reports being frustrated due to people trying to help her when she did not want it.  ? ? ?Health Maintenance Due  ?Topic Date Due  ? OPHTHALMOLOGY EXAM  08/23/2016  ? COVID-19 Vaccine (3 - Pfizer risk series) 09/13/2020  ? ? ?Past Medical History:  ?Diagnosis Date  ? Acute hypoxemic respiratory failure due to COVID-19 [facility]) 10/08/2020  ? Cellulitis and abscess of finger, unspecified 401.9  ? Cellulitis and abscess of foot, except toes   ? Depressive disorder, not elsewhere classified   ? GERD (gastroesophageal reflux disease)   ? Gout, unspecified   ? History of tobacco abuse 12/01/2018  ? Hyperglycemia 05/08/2011  ? Hyperlipidemia   ? borderline- not on meds  ? Hypertension   ? Insomnia   ? Leg edema   ? chronic  ?  Obesity, unspecified   ? Osteoarthritis   ? Renal insufficiency, mild 05/08/2011  ? SBO (small bowel obstruction) ([place]) 12/01/2018  ? Small bowel obstruction ([place])   ? ? ?Past Surgical History:  ?Procedure Laterality Date  ? ABDOMINAL HYSTERECTOMY  2002  ? infection- no history of cancer  ? APPENDECTOMY  2002  ? catacact Right 11/01/2019  ? CHOLECYSTECTOMY  1977  ? ECTOPIC PREGNANCY SURGERY  1971  ? right shoulder rotator cuff repair  06-07-10  ? TOTAL HIP ARTHROPLASTY Left 1998  ? ? ?Family History  ?Problem Relation Age of Onset  ? Diabetes Mother   ? Cancer Mother   ?     colon/ pancreatic  ? Colon cancer Mother 4  ? Hypertension Father   ? Alcohol abuse Father   ? Cirrhosis Father   ? Other Sister   ?     Pyeoderma gangrenosis  ? Diabetes Sister   ?     type 2  ? Kidney failure Sister   ? Liver disease Sister   ? Cirrhosis Sister   ?     had NASH, died heart failure  ? Heart attack Brother   ? Stomach cancer Maternal Grandfather   ? Obesity Daughter   ? Rectal cancer Neg Hx   ? Esophageal cancer Neg Hx   ? ? ?Social History  ? ?Socioeconomic History  ? Marital status: Divorced  ?  Spouse name: Not on file  ? Number of children: 2  ? Years of education: Not on file  ? Highest education level: Not on file  ?Occupational History  ? Occupation: works prn at [employer]  ?  Employer: [employer]  ?Tobacco Use  ? Smoking status: Former  ?  Packs/day: 0.25  ?  Types: Cigarettes  ?  Quit date: 09/25/2020  ?  Years since quitting: 1.4  ? Smokeless tobacco: Never  ?Vaping Use  ? Vaping Use: Never used  ?Substance and Sexual Activity  ? Alcohol use: Yes  ?  Alcohol/week: 0.0 standard drinks  ?  Comment: rare  ? Drug use: No  ? Sexual activity: Not Currently  ?Other Topics Concern  ? Not on file  ?Social History Narrative  ? 2 children (daughter and son) both local. 3 grandchildren, one great grandchild  ? Works at ED front desk  ? Divorced (married x 30 years)  ? ?Social Determinants of Health  ? ?Financial Resource Strain: Low Risk    ? Difficulty of Paying Living Expenses: Not hard at all  ?Food Insecurity: No Food Insecurity  ? Worried About Charity fundraiser in the Last Year: Never true  ? Ran Out of Food in the Last Year: Never true  ?Transportation Needs: No Transportation Needs  ? Lack of Transportation (Medical): No  ? Lack of Transportation (Non-Medical): No  ?Physical Activity: Insufficiently Active  ? Days of Exercise per Week: 3 days  ? Minutes of Exercise per Session: 30 min  ?Stress: No Stress Concern Present  ? Feeling of Stress : Not at all  ?Social Connections: Moderately Isolated  ? Frequency of Communication with Friends and Family: More than three times a week  ? Frequency of Social Gatherings with Friends and Family: More than three times a week  ? Attends Religious Services: More than 4 times per year  ? Active Member of Clubs or Organizations: No  ? Attends Archivist Meetings: Never  ? Marital Status: Divorced  ?Intimate Partner Violence: Not At Risk  ? Fear of Current or Ex-Partner: No  ? Emotionally Abused: No  ? Physically Abused: No  ? Sexually Abused: No  ? ? ?Outpatient Medications Prior to Visit  ?Medication Sig Dispense Refill  ? allopurinol (ZYLOPRIM) 100 MG tablet Take 2 tablets (200 mg total) by mouth daily. 180 tablet 0  ? aspirin EC 81 MG tablet Take 81 mg by mouth daily.    ? Cholecalciferol 25 MCG (1000 UT) capsule Take 1,000 Units by mouth daily.    ? DULoxetine (CYMBALTA) 60 MG capsule Take 1 capsule (60 mg total) by mouth daily. 90 capsule 0  ? Multiple Vitamin (MULTIVITAMIN WITH MINERALS) TABS tablet Take 1 tablet by mouth daily.    ? traMADol (ULTRAM) 50 MG tablet Take 1 tablet (50 mg total) by mouth every 8 (eight) hours as needed. 30 tablet 0  ? triamterene-hydrochlorothiazide (DYAZIDE) 37.5-25 MG capsule Take 1 capsule by mouth daily. 90 capsule 1  ? furosemide (LASIX) 40 MG tablet TAKE 1 TABLET BY MOUTH ONCE DAILY AS NEEDED 90 tablet 1  ? ?No facility-administered medications prior to  visit.  ? ? ?No Known Allergies ? ?ROS ?See HPI ?   ?Objective:  ?  ?Physical Exam ?Constitutional:   ?   General: She is not in acute distress. ?   Appearance: Normal appearance. She is not ill-appearing.  ?HENT:  ?   Head: Normocephalic and atraumatic.  ?  Right Ear: External ear normal.  ?   Left Ear: External ear normal.  ?Eyes:  ?   Extraocular Movements: Extraocular movements intact.  ?   Pupils: Pupils are equal, round, and reactive to light.  ?Cardiovascular:  ?   Rate and Rhythm: Normal rate and regular rhythm.  ?   Heart sounds: Normal heart sounds. No murmur heard. ?  No gallop.  ?Pulmonary:  ?   Effort: Pulmonary effort is normal. No respiratory distress.  ?   Breath sounds: Normal breath sounds. No wheezing or rales.  ?Skin: ?   General: Skin is warm and dry.  ?Neurological:  ?   Mental Status: She is alert and oriented to person, place, and time.  ?Psychiatric:     ?   Judgment: Judgment normal.  ? ? ?BP 139/67 (BP Location: Right Arm, Patient Position: Sitting, Cuff Size: Large)   Pulse 60   Temp 98 ?F (36.7 ?C) (Oral)   Resp 16   Wt 267 lb (121.1 kg)   SpO2 100%   BMI 45.83 kg/m?  ?Wt Readings from Last 3 Encounters:  ?03/20/22 267 lb (121.1 kg)  ?03/08/22 268 lb (121.6 kg)  ?12/07/21 267 lb 3.2 oz (121.2 kg)  ? ? ?   ?Assessment & Plan:  ? ?Problem List Items Addressed This Visit   ? ?  ? Unprioritized  ? Essential hypertension - Primary  ?  BP Readings from Last 3 Encounters:  ?03/20/22 139/67  ?03/08/22 (!) 161/83  ?12/07/21 136/66  ?BP is now at goal. Continue dyazide.  Obtain follow up bmet.  ?  ?  ? Relevant Orders  ? Basic metabolic panel  ? Diabetes type 2, controlled ([place])  ?  Discussed diabetic diet, exercise and weight loss.  ?  ?  ? Depression  ?  Improved. Continue cymbalta.  ?  ?  ? ? ? ?No orders of the defined types were placed in this encounter. ? ? ?I, Nance Pear, NP, personally preformed the services described in this documentation.  All medical record entries made  by the scribe were at my direction and in my presence.  I have reviewed the chart and discharge instructions (if applicable) and agree that the record reflects my personal performance and is accurate and com

## 2022-03-20 NOTE — Assessment & Plan Note (Signed)
Discussed diabetic diet, exercise and weight loss.  ?

## 2022-03-20 NOTE — Assessment & Plan Note (Signed)
Improved. Continue cymbalta.  ?

## 2022-04-15 ENCOUNTER — Other Ambulatory Visit: Payer: Self-pay | Admitting: Family

## 2022-04-15 ENCOUNTER — Other Ambulatory Visit (HOSPITAL_BASED_OUTPATIENT_CLINIC_OR_DEPARTMENT_OTHER): Payer: Self-pay

## 2022-04-16 ENCOUNTER — Other Ambulatory Visit (HOSPITAL_BASED_OUTPATIENT_CLINIC_OR_DEPARTMENT_OTHER): Payer: Self-pay

## 2022-04-16 MED ORDER — PANTOPRAZOLE SODIUM 40 MG PO TBEC
DELAYED_RELEASE_TABLET | Freq: Every day | ORAL | 1 refills | Status: DC
  Filled 2022-04-16: qty 90, 90d supply, fill #0
  Filled 2022-07-26: qty 90, 90d supply, fill #1

## 2022-04-16 MED ORDER — TRAMADOL HCL 50 MG PO TABS
50.0000 mg | ORAL_TABLET | Freq: Three times a day (TID) | ORAL | 0 refills | Status: DC | PRN
  Filled 2022-04-16: qty 30, 10d supply, fill #0

## 2022-04-24 DIAGNOSIS — M4316 Spondylolisthesis, lumbar region: Secondary | ICD-10-CM | POA: Diagnosis not present

## 2022-04-24 DIAGNOSIS — Z6841 Body Mass Index (BMI) 40.0 and over, adult: Secondary | ICD-10-CM | POA: Diagnosis not present

## 2022-04-30 DIAGNOSIS — M5416 Radiculopathy, lumbar region: Secondary | ICD-10-CM | POA: Diagnosis not present

## 2022-04-30 DIAGNOSIS — M5116 Intervertebral disc disorders with radiculopathy, lumbar region: Secondary | ICD-10-CM | POA: Diagnosis not present

## 2022-05-08 IMAGING — DX DG LUMBAR SPINE COMPLETE 4+V
5 series · 5 of 5 positions shown · non-contrast
Comparison: 07/11/2009

CLINICAL DATA: Low back pain and left-sided radiculopathy, initial
encounter

EXAM:
LUMBAR SPINE - COMPLETE 4+ VIEW

[l-spine ap]
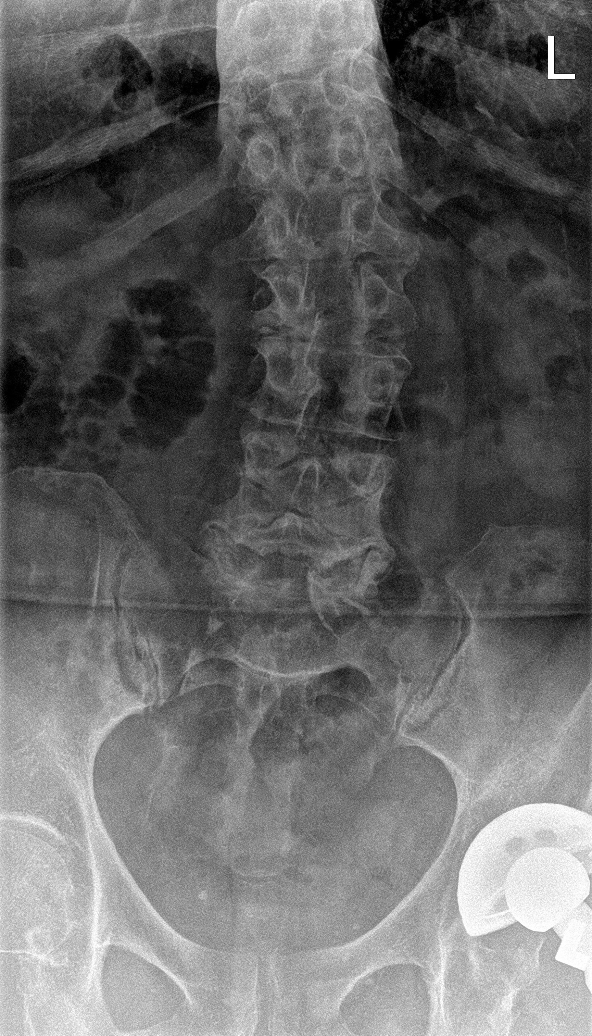

[l-spine obl (1 of 2)]
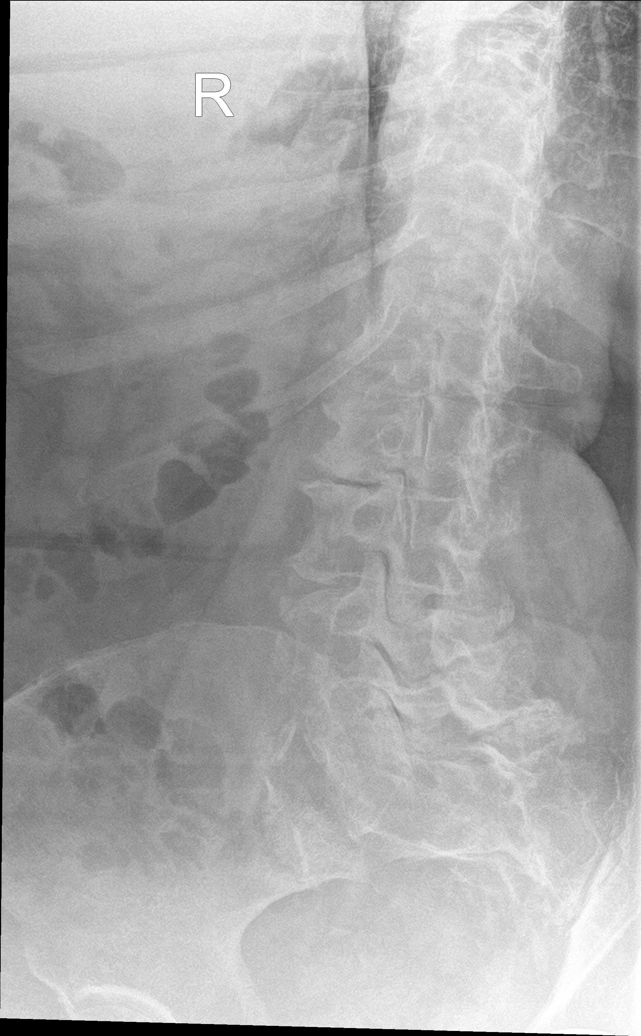

[l-spine obl (2 of 2)]
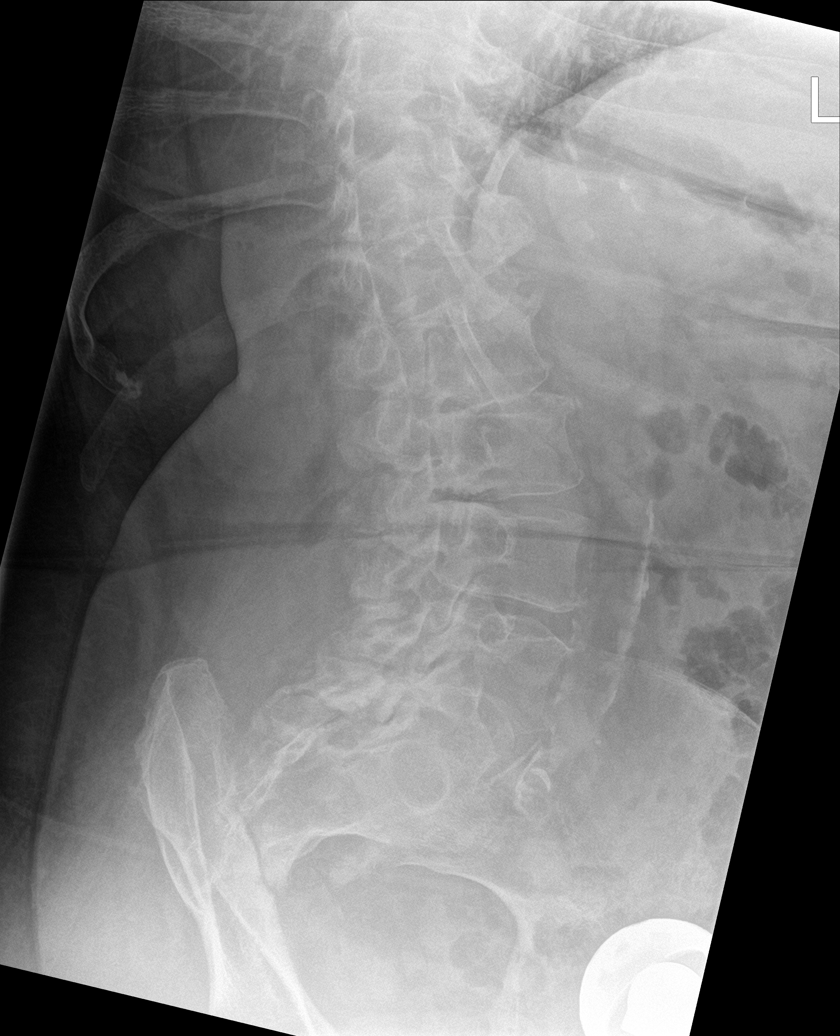

[l-spine lat]
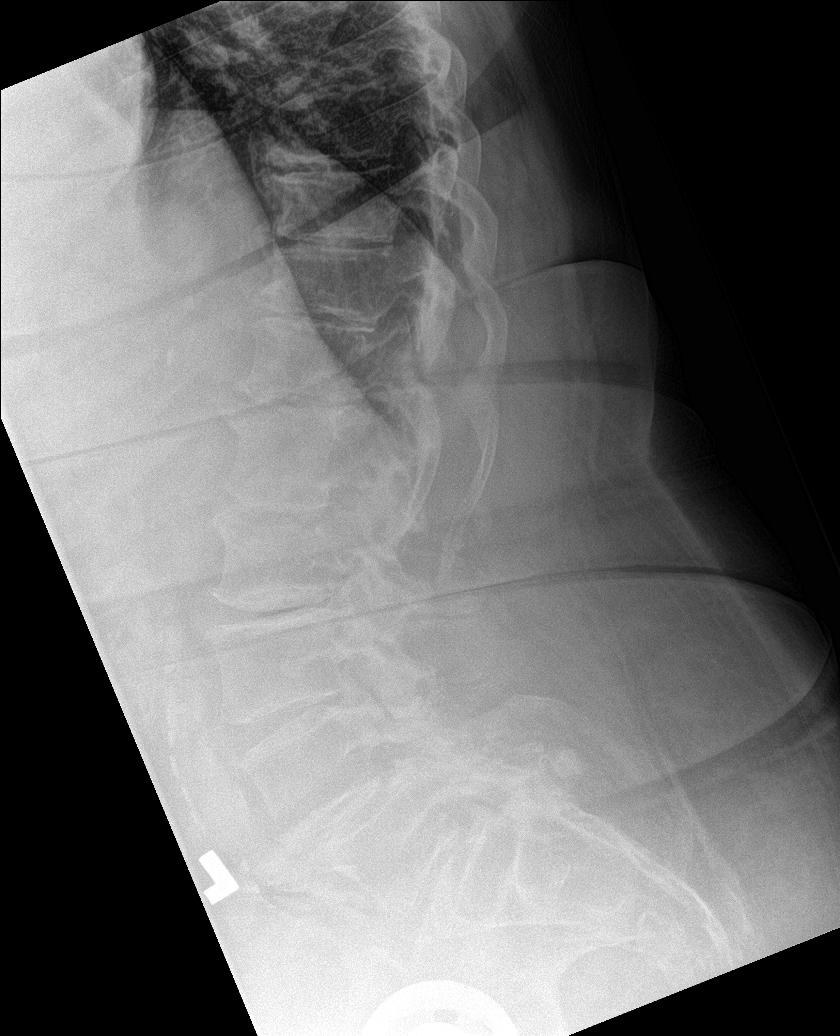

[l-spine spot]
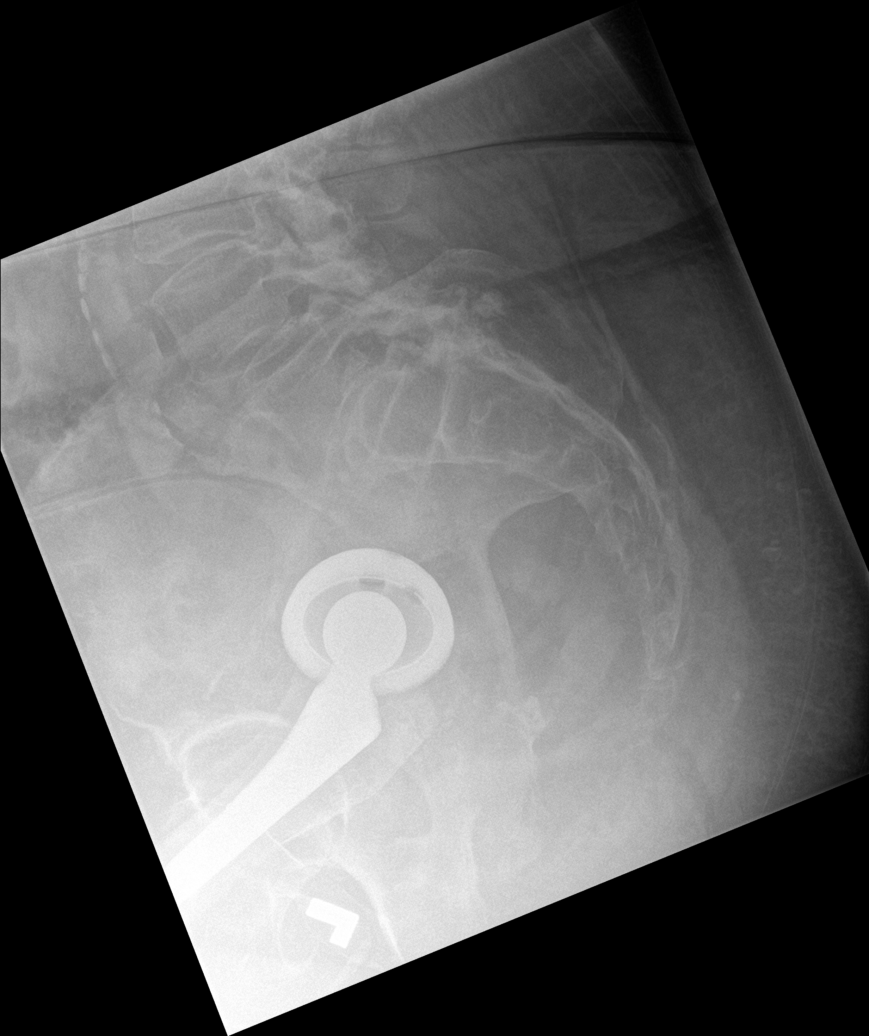

[5 of 5 positions shown; findings below may reference images not displayed]

FINDINGS: Five lumbar type vertebral bodies are well visualized. Vertebral
body height is well maintained. Multilevel facet hypertrophic
changes are seen. No pars defects are noted. Degenerative
anterolisthesis of L4 on L5 is noted. Mild osteophytic changes are
seen. Left hip replacement is noted.
IMPRESSION: Multilevel degenerative change with stable anterolisthesis of L4 on
L5. No acute abnormality noted.

## 2022-05-19 ENCOUNTER — Other Ambulatory Visit: Payer: Self-pay | Admitting: Family

## 2022-05-20 MED ORDER — ALLOPURINOL 100 MG PO TABS
200.0000 mg | ORAL_TABLET | Freq: Every day | ORAL | 0 refills | Status: DC
  Filled 2022-05-22: qty 180, 90d supply, fill #0
  Filled ????-??-??: fill #0

## 2022-05-20 MED ORDER — DULOXETINE HCL 60 MG PO CPEP
60.0000 mg | ORAL_CAPSULE | Freq: Every day | ORAL | 0 refills | Status: DC
  Filled 2022-05-22: qty 90, 90d supply, fill #0
  Filled ????-??-??: fill #0

## 2022-05-21 ENCOUNTER — Other Ambulatory Visit: Payer: Self-pay | Admitting: Family

## 2022-05-21 ENCOUNTER — Other Ambulatory Visit (HOSPITAL_BASED_OUTPATIENT_CLINIC_OR_DEPARTMENT_OTHER): Payer: Self-pay

## 2022-05-21 MED ORDER — TRAMADOL HCL 50 MG PO TABS
50.0000 mg | ORAL_TABLET | Freq: Three times a day (TID) | ORAL | 0 refills | Status: DC | PRN
  Filled 2022-05-21: qty 30, 10d supply, fill #0

## 2022-05-21 NOTE — Telephone Encounter (Signed)
Requesting:tramadol 50 mg Contract:12/07/21 UDS:06/26/21 Last Visit:03/20/22 Next Visit:07/22/22 Last Refill:04/16/22  Please Advise

## 2022-05-22 ENCOUNTER — Other Ambulatory Visit (HOSPITAL_BASED_OUTPATIENT_CLINIC_OR_DEPARTMENT_OTHER): Payer: Self-pay

## 2022-06-09 ENCOUNTER — Other Ambulatory Visit: Payer: Self-pay | Admitting: Family

## 2022-06-10 ENCOUNTER — Other Ambulatory Visit (HOSPITAL_BASED_OUTPATIENT_CLINIC_OR_DEPARTMENT_OTHER): Payer: Self-pay

## 2022-06-10 MED ORDER — TRAMADOL HCL 50 MG PO TABS
50.0000 mg | ORAL_TABLET | Freq: Three times a day (TID) | ORAL | 0 refills | Status: AC | PRN
  Filled 2022-06-10: qty 30, 10d supply, fill #0

## 2022-06-24 ENCOUNTER — Other Ambulatory Visit (HOSPITAL_BASED_OUTPATIENT_CLINIC_OR_DEPARTMENT_OTHER): Payer: Self-pay

## 2022-06-24 MED ORDER — DICLOFENAC SODIUM 75 MG PO TBEC
DELAYED_RELEASE_TABLET | ORAL | 2 refills | Status: DC
  Filled 2022-06-24 – 2022-06-28 (×2): qty 60, 30d supply, fill #0
  Filled 2022-08-21: qty 60, 30d supply, fill #1
  Filled 2022-10-17: qty 60, 30d supply, fill #2

## 2022-06-28 ENCOUNTER — Other Ambulatory Visit (HOSPITAL_BASED_OUTPATIENT_CLINIC_OR_DEPARTMENT_OTHER): Payer: Self-pay

## 2022-07-08 DIAGNOSIS — M5416 Radiculopathy, lumbar region: Secondary | ICD-10-CM | POA: Diagnosis not present

## 2022-07-08 DIAGNOSIS — M5116 Intervertebral disc disorders with radiculopathy, lumbar region: Secondary | ICD-10-CM | POA: Diagnosis not present

## 2022-07-11 ENCOUNTER — Encounter (HOSPITAL_BASED_OUTPATIENT_CLINIC_OR_DEPARTMENT_OTHER): Payer: Self-pay | Admitting: *Deleted

## 2022-07-11 ENCOUNTER — Emergency Department (HOSPITAL_BASED_OUTPATIENT_CLINIC_OR_DEPARTMENT_OTHER): Payer: PPO

## 2022-07-11 ENCOUNTER — Inpatient Hospital Stay (HOSPITAL_BASED_OUTPATIENT_CLINIC_OR_DEPARTMENT_OTHER)
Admission: EM | Admit: 2022-07-11 | Discharge: 2022-07-14 | DRG: 389 | Disposition: A | Discharge status: Home / Self Care | Payer: PPO | Attending: Internal Medicine | Admitting: Internal Medicine

## 2022-07-11 ENCOUNTER — Other Ambulatory Visit: Payer: Self-pay

## 2022-07-11 DIAGNOSIS — K219 Gastro-esophageal reflux disease without esophagitis: Secondary | ICD-10-CM | POA: Diagnosis present

## 2022-07-11 DIAGNOSIS — E785 Hyperlipidemia, unspecified: Secondary | ICD-10-CM | POA: Diagnosis not present

## 2022-07-11 DIAGNOSIS — Z833 Family history of diabetes mellitus: Secondary | ICD-10-CM | POA: Diagnosis not present

## 2022-07-11 DIAGNOSIS — K5651 Intestinal adhesions [bands], with partial obstruction: Principal | ICD-10-CM | POA: Diagnosis present

## 2022-07-11 DIAGNOSIS — Z4659 Encounter for fitting and adjustment of other gastrointestinal appliance and device: Secondary | ICD-10-CM | POA: Diagnosis not present

## 2022-07-11 DIAGNOSIS — E119 Type 2 diabetes mellitus without complications: Secondary | ICD-10-CM | POA: Diagnosis not present

## 2022-07-11 DIAGNOSIS — N179 Acute kidney failure, unspecified: Secondary | ICD-10-CM | POA: Diagnosis not present

## 2022-07-11 DIAGNOSIS — E869 Volume depletion, unspecified: Secondary | ICD-10-CM | POA: Diagnosis present

## 2022-07-11 DIAGNOSIS — K6389 Other specified diseases of intestine: Secondary | ICD-10-CM | POA: Diagnosis not present

## 2022-07-11 DIAGNOSIS — Z8249 Family history of ischemic heart disease and other diseases of the circulatory system: Secondary | ICD-10-CM | POA: Diagnosis not present

## 2022-07-11 DIAGNOSIS — E669 Obesity, unspecified: Secondary | ICD-10-CM | POA: Diagnosis present

## 2022-07-11 DIAGNOSIS — Z6841 Body Mass Index (BMI) 40.0 and over, adult: Secondary | ICD-10-CM

## 2022-07-11 DIAGNOSIS — M109 Gout, unspecified: Secondary | ICD-10-CM | POA: Diagnosis not present

## 2022-07-11 DIAGNOSIS — R197 Diarrhea, unspecified: Secondary | ICD-10-CM | POA: Diagnosis not present

## 2022-07-11 DIAGNOSIS — A080 Rotaviral enteritis: Secondary | ICD-10-CM | POA: Diagnosis present

## 2022-07-11 DIAGNOSIS — Z7982 Long term (current) use of aspirin: Secondary | ICD-10-CM | POA: Diagnosis not present

## 2022-07-11 DIAGNOSIS — G894 Chronic pain syndrome: Secondary | ICD-10-CM | POA: Diagnosis not present

## 2022-07-11 DIAGNOSIS — F32A Depression, unspecified: Secondary | ICD-10-CM | POA: Diagnosis present

## 2022-07-11 DIAGNOSIS — M4316 Spondylolisthesis, lumbar region: Secondary | ICD-10-CM | POA: Diagnosis not present

## 2022-07-11 DIAGNOSIS — Z96642 Presence of left artificial hip joint: Secondary | ICD-10-CM | POA: Diagnosis not present

## 2022-07-11 DIAGNOSIS — Z79899 Other long term (current) drug therapy: Secondary | ICD-10-CM | POA: Diagnosis not present

## 2022-07-11 DIAGNOSIS — K5730 Diverticulosis of large intestine without perforation or abscess without bleeding: Secondary | ICD-10-CM | POA: Diagnosis not present

## 2022-07-11 DIAGNOSIS — I10 Essential (primary) hypertension: Secondary | ICD-10-CM | POA: Diagnosis present

## 2022-07-11 DIAGNOSIS — A0839 Other viral enteritis: Secondary | ICD-10-CM | POA: Diagnosis not present

## 2022-07-11 DIAGNOSIS — K56609 Unspecified intestinal obstruction, unspecified as to partial versus complete obstruction: Secondary | ICD-10-CM | POA: Diagnosis not present

## 2022-07-11 DIAGNOSIS — Z87891 Personal history of nicotine dependence: Secondary | ICD-10-CM

## 2022-07-11 DIAGNOSIS — R112 Nausea with vomiting, unspecified: Secondary | ICD-10-CM | POA: Diagnosis not present

## 2022-07-11 DIAGNOSIS — K5939 Other megacolon: Secondary | ICD-10-CM | POA: Diagnosis not present

## 2022-07-11 LAB — CBC WITH DIFFERENTIAL/PLATELET
Abs Immature Granulocytes: 0.15 10*3/uL — ABNORMAL HIGH (ref 0.00–0.07)
Basophils Absolute: 0.1 10*3/uL (ref 0.0–0.1)
Basophils Relative: 0 %
Eosinophils Absolute: 0 10*3/uL (ref 0.0–0.5)
Eosinophils Relative: 0 %
HCT: 50.4 % — ABNORMAL HIGH (ref 36.0–46.0)
Hemoglobin: 16.9 g/dL — ABNORMAL HIGH (ref 12.0–15.0)
Immature Granulocytes: 1 %
Lymphocytes Relative: 9 %
Lymphs Abs: 1.1 10*3/uL (ref 0.7–4.0)
MCH: 30.9 pg (ref 26.0–34.0)
MCHC: 33.5 g/dL (ref 30.0–36.0)
MCV: 92.1 fL (ref 80.0–100.0)
Monocytes Absolute: 0.5 10*3/uL (ref 0.1–1.0)
Monocytes Relative: 4 %
Neutro Abs: 9.8 10*3/uL — ABNORMAL HIGH (ref 1.7–7.7)
Neutrophils Relative %: 86 %
Platelets: 327 10*3/uL (ref 150–400)
RBC: 5.47 MIL/uL — ABNORMAL HIGH (ref 3.87–5.11)
RDW: 13.9 % (ref 11.5–15.5)
WBC: 11.6 10*3/uL — ABNORMAL HIGH (ref 4.0–10.5)
nRBC: 0 % (ref 0.0–0.2)

## 2022-07-11 LAB — COMPREHENSIVE METABOLIC PANEL
ALT: 32 U/L (ref 0–44)
AST: 31 U/L (ref 15–41)
Albumin: 5 g/dL (ref 3.5–5.0)
Alkaline Phosphatase: 120 U/L (ref 38–126)
Anion gap: 15 (ref 5–15)
BUN: 46 mg/dL — ABNORMAL HIGH (ref 8–23)
CO2: 23 mmol/L (ref 22–32)
Calcium: 10.1 mg/dL (ref 8.9–10.3)
Chloride: 102 mmol/L (ref 98–111)
Creatinine, Ser: 1.73 mg/dL — ABNORMAL HIGH (ref 0.44–1.00)
GFR, Estimated: 31 mL/min — ABNORMAL LOW (ref >60)
Glucose, Bld: 210 mg/dL — ABNORMAL HIGH (ref 70–99)
Potassium: 4 mmol/L (ref 3.5–5.1)
Sodium: 140 mmol/L (ref 135–145)
Total Bilirubin: 0.7 mg/dL (ref 0.3–1.2)
Total Protein: 9 g/dL — ABNORMAL HIGH (ref 6.5–8.1)

## 2022-07-11 LAB — LACTIC ACID, PLASMA
Lactic Acid, Venous: 4 mmol/L (ref 0.5–1.9)
Lactic Acid, Venous: 2.7 mmol/L (ref 0.5–1.9)
Lactic Acid, Venous: 1.7 mmol/L (ref 0.5–1.9)

## 2022-07-11 LAB — LIPASE, BLOOD: Lipase: 22 U/L (ref 11–51)

## 2022-07-11 LAB — GLUCOSE, CAPILLARY: Glucose-Capillary: 133 mg/dL — ABNORMAL HIGH (ref 70–99)

## 2022-07-11 MED ORDER — HYDROMORPHONE HCL 1 MG/ML IJ SOLN
1.0000 mg | INTRAMUSCULAR | Status: DC | PRN
  Administered 2022-07-11: 1 mg via INTRAVENOUS
  Filled 2022-07-11: qty 1

## 2022-07-11 MED ORDER — KETAMINE HCL 10 MG/ML IJ SOLN
10.0000 mg | Freq: Once | INTRAMUSCULAR | Status: AC
  Administered 2022-07-11: 10 mg via INTRAVENOUS
  Filled 2022-07-11: qty 1

## 2022-07-11 MED ORDER — SODIUM CHLORIDE 0.9 % IV BOLUS
1000.0000 mL | Freq: Once | INTRAVENOUS | Status: AC
  Administered 2022-07-11: 1000 mL via INTRAVENOUS

## 2022-07-11 MED ORDER — HYDRALAZINE HCL 20 MG/ML IJ SOLN
10.0000 mg | INTRAMUSCULAR | Status: DC | PRN
  Administered 2022-07-13: 10 mg via INTRAVENOUS
  Filled 2022-07-11: qty 1

## 2022-07-11 MED ORDER — SODIUM CHLORIDE 0.9 % IV SOLN
2.0000 g | Freq: Once | INTRAVENOUS | Status: AC
  Administered 2022-07-11: 2 g via INTRAVENOUS
  Filled 2022-07-11: qty 20

## 2022-07-11 MED ORDER — ONDANSETRON HCL 4 MG/2ML IJ SOLN
4.0000 mg | Freq: Once | INTRAMUSCULAR | Status: AC
  Administered 2022-07-11: 4 mg via INTRAVENOUS
  Filled 2022-07-11: qty 2

## 2022-07-11 MED ORDER — IOHEXOL 300 MG/ML  SOLN
100.0000 mL | Freq: Once | INTRAMUSCULAR | Status: AC | PRN
  Administered 2022-07-11: 80 mL via INTRAVENOUS

## 2022-07-11 MED ORDER — HYDROMORPHONE HCL 1 MG/ML IJ SOLN
0.5000 mg | Freq: Once | INTRAMUSCULAR | Status: AC
  Administered 2022-07-11: 0.5 mg via INTRAVENOUS
  Filled 2022-07-11: qty 1

## 2022-07-11 MED ORDER — SODIUM CHLORIDE 0.9 % IV SOLN: INTRAVENOUS | Status: AC

## 2022-07-11 MED ORDER — INSULIN ASPART 100 UNIT/ML IJ SOLN
0.0000 [IU] | INTRAMUSCULAR | Status: DC
  Administered 2022-07-12: 1 [IU] via SUBCUTANEOUS

## 2022-07-11 MED ORDER — ENOXAPARIN SODIUM 60 MG/0.6ML IJ SOSY
55.0000 mg | PREFILLED_SYRINGE | INTRAMUSCULAR | Status: DC
  Administered 2022-07-11 – 2022-07-13 (×3): 55 mg via SUBCUTANEOUS
  Filled 2022-07-11 (×3): qty 0.6

## 2022-07-11 MED ORDER — ONDANSETRON HCL 4 MG/2ML IJ SOLN
4.0000 mg | Freq: Four times a day (QID) | INTRAMUSCULAR | Status: DC | PRN
Start: 2022-07-11 — End: 2022-07-14
  Administered 2022-07-11 – 2022-07-12 (×2): 4 mg via INTRAVENOUS
  Filled 2022-07-11 (×2): qty 2

## 2022-07-11 NOTE — ED Notes (Signed)
Placed on 2lpm [facility] after pain meds.  SpO2 88% on r/a.

## 2022-07-11 NOTE — Assessment & Plan Note (Signed)
Hemoglobin A1c 6.9% on 03/08/2022.  Not currently on medical management. -Place on sensitive SSI q4h while n.p.o. and adjust as needed

## 2022-07-11 NOTE — Progress Notes (Signed)
Plan of Care Note for accepted transfer   Patient: Jordan [name] MRN: [id number]   DOA: 07/11/2022  Facility requesting transfer: [facility] Requesting Provider: Dr. Primitivo Merkey Nine Reason for transfer: SBO Facility course: 71 yo F with Pmhx of HTN, HLD, OA. Presenting with intractable N/V. Decreased stools over last 24 hours. Has elevated lactate and AKI. EDP spoke with general surgery. They will see her when she gets to [facility]. She was started on NGT and fluids.    Plan of care: The patient is accepted for admission to Telemetry unit, at [facility]..  While holding at [facility], medical decision making responsibilities remain with the EDP. Upon arrival to [facility], [facility] [place] [facility] will assume care. Thank you.   Author: Jonnie Finner, DO 07/11/2022  Check [email or web address] for on-call coverage.  Nursing staff, Please call [place] number on Amion as soon as patient's arrival, so appropriate admitting provider can evaluate the pt.

## 2022-07-11 NOTE — ED Provider Notes (Signed)
[facility] EMERGENCY DEPARTMENT Provider Note   CSN: [id number] Arrival date & time: 07/11/22  5573     History  Chief Complaint  Patient presents with   Emesis    Jordan [name] is a 71 y.o. female.  71 yo F with a chief complaints of nausea vomiting and diarrhea.  This been going on for about 4 days.  Occurred shortly after getting injections in her back for ongoing back discomfort.  She feels like her back pain is gotten significantly better.  She denies any loss of bowel bladder or loss of peritoneal sensation denies numbness or weakness in the legs.  She is having some upper abdominal discomfort with this.  Has a history of bowel obstruction in the past and she is concerned that maybe that is reoccurred.  She initially was denying any output out of her rectum but then later stated that she has been having very watery stools.   Emesis      Home Medications Prior to Admission medications   Medication Sig Start Date End Date Taking? Authorizing Provider  allopurinol (ZYLOPRIM) 100 MG tablet Take 2 tablets (200 mg total) by mouth daily. 05/20/22   [name], NP  aspirin EC 81 MG tablet Take 81 mg by mouth daily.    [provider]  Cholecalciferol 25 MCG (1000 UT) capsule Take 1,000 Units by mouth daily.    [provider]  diclofenac (VOLTAREN) 75 MG EC tablet Take 1 tablet by mouth 2 times daily 06/21/22     DULoxetine (CYMBALTA) 60 MG capsule Take 1 capsule (60 mg total) by mouth daily. 05/20/22   [name], NP  furosemide (LASIX) 40 MG tablet TAKE 1 TABLET BY MOUTH ONCE DAILY AS NEEDED 03/09/21 03/09/22  [name], NP  Multiple Vitamin (MULTIVITAMIN WITH MINERALS) TABS tablet Take 1 tablet by mouth daily.    [provider]  pantoprazole (PROTONIX) 40 MG tablet TAKE 1 TABLET BY MOUTH ONCE DAILY 04/16/22   [name], NP  triamterene-hydrochlorothiazide (DYAZIDE) 37.5-25 MG capsule Take 1 capsule by mouth  daily. 03/08/22   [name], NP  famotidine (PEPCID AC) 10 MG chewable tablet Chew 10 mg by mouth daily.  03/03/12  [provider]      Allergies    Patient has no known allergies.    Review of Systems   Review of Systems  Gastrointestinal:  Positive for vomiting.    Physical Exam Updated Vital Signs BP (!) 160/86   Pulse 88   Temp 97.6 F (36.4 C) (Oral)   Resp 18   Ht '5\' 4"'$  (1.626 m)   Wt 112.4 kg   SpO2 98%   BMI 42.53 kg/m  Physical Exam Vitals and nursing note reviewed.  Constitutional:      General: She is not in acute distress.    Appearance: She is well-developed. She is not diaphoretic.     Comments: BMI 42  HENT:     Head: Normocephalic and atraumatic.  Eyes:     Pupils: Pupils are equal, round, and reactive to light.  Cardiovascular:     Rate and Rhythm: Normal rate and regular rhythm.     Heart sounds: No murmur heard.    No friction rub. No gallop.  Pulmonary:     Effort: Pulmonary effort is normal.     Breath sounds: No wheezing or rales.  Abdominal:     General: There is no distension.     Palpations: Abdomen is soft.  Tenderness: There is no abdominal tenderness.  Musculoskeletal:        General: No tenderness.     Cervical back: Normal range of motion and neck supple.     Comments: Pulse motor and sensation intact bilateral lower extremities.  Reflexes are 2+ and equal.  There is no clonus.  Negative Babinski.  Skin:    General: Skin is warm and dry.  Neurological:     Mental Status: She is alert and oriented to person, place, and time.  Psychiatric:        Behavior: Behavior normal.     ED Results / Procedures / Treatments   Labs (all labs ordered are listed, but only abnormal results are displayed) Labs Reviewed  CBC WITH DIFFERENTIAL/PLATELET - Abnormal; Notable for the following components:      Result Value   WBC 11.6 (*)    RBC 5.47 (*)    Hemoglobin 16.9 (*)    HCT 50.4 (*)    Neutro Abs 9.8 (*)    Abs  Immature Granulocytes 0.15 (*)    All other components within normal limits  COMPREHENSIVE METABOLIC PANEL - Abnormal; Notable for the following components:   Glucose, Bld 210 (*)    BUN 46 (*)    Creatinine, Ser 1.73 (*)    Total Protein 9.0 (*)    GFR, Estimated 31 (*)    All other components within normal limits  LACTIC ACID, PLASMA - Abnormal; Notable for the following components:   Lactic Acid, Venous 4.0 (*)    All other components within normal limits  GASTROINTESTINAL PANEL BY PCR, STOOL (REPLACES STOOL CULTURE)  LIPASE, BLOOD  LACTIC ACID, PLASMA    EKG None  Radiology CT L-SPINE NO CHARGE  Result Date: 07/11/2022 CLINICAL DATA:  Patient reports recent spinal injections (3 days ago) with subsequent malaise, nausea and vomiting. EXAM: CT LUMBAR SPINE WITHOUT CONTRAST TECHNIQUE: Multiplanar CT image reconstructions of the lumbar spine were generated from concurrent abdominopelvic CT dictated separately. RADIATION DOSE REDUCTION: This exam was performed according to the departmental dose-optimization program which includes automated exposure control, adjustment of the mA and/or kV according to patient size and/or use of iterative reconstruction technique. COMPARISON:  MRI lumbar spine 11/24/2021. FINDINGS: Segmentation: There are 5 lumbar type vertebral bodies. Alignment: Stable grade 1 degenerative anterolisthesis at L4-5. There is a mild convex left scoliosis. Vertebrae: No evidence of acute fracture or pars defect. There is multilevel spondylosis with advanced facet disease inferiorly manifesting as osteophytes, subchondral sclerosis and vacuum phenomenon. Sacroiliac degenerative changes are present bilaterally. Paraspinal and other soft tissues: Intra-abdominal findings are dictated separately. No acute paraspinal findings are seen. Disc levels: T12-L1: Degenerative disc disease with disc bulging, vacuum phenomenon and endplate osteophytes. Stable left foraminal narrowing. L1-2: Mild  disc bulging and endplate osteophyte formation. Mild facet and ligamentous hypertrophy. Stable mild to moderate spinal stenosis with mild lateral recess and foraminal narrowing bilaterally. L2-3: Chronic disc degeneration with annular disc bulging and endplate osteophytes asymmetric to the right. Vacuum phenomena. Moderate facet and ligamentous hypertrophy. Moderate multifactorial spinal stenosis with asymmetric right lateral recess and right foraminal narrowing. L3-4: Stable annular disc bulging with moderate facet and ligamentous hypertrophy. Mild to moderate spinal stenosis with asymmetric right lateral recess and right foraminal narrowing. L4-5: Loss of disc height with annular disc bulging. Severe bilateral facet hypertrophy accounting for the grade 1 anterolisthesis. Resulting severe multifactorial spinal stenosis with moderate to severe lateral recess and foraminal narrowing bilaterally. Probable chronic bilateral L4 nerve root  encroachment. L5-S1: Mild disc bulging. Severe bilateral facet hypertrophy. No significant spinal stenosis. IMPRESSION: 1. No acute lumbar spine findings are identified. Specifically, no abnormal paraspinal or epidural fluid collections are seen after reported recent spinal injection. 2. Severe multi level lumbar spondylosis again noted, similar to previous MRI. There is spinal stenosis, lateral recess and foraminal narrowing at multiple levels, most advanced at L4-5. 3. No acute osseous findings. 4. Abdominopelvic CT findings dictated separately. Electronically Signed   By: Richardean Sale M.D.   On: 07/11/2022 11:27   CT ABDOMEN PELVIS W CONTRAST  Result Date: 07/11/2022 CLINICAL DATA:  Abdominal pain, nausea, vomiting EXAM: CT ABDOMEN AND PELVIS WITH CONTRAST TECHNIQUE: Multidetector CT imaging of the abdomen and pelvis was performed using the standard protocol following bolus administration of intravenous contrast. RADIATION DOSE REDUCTION: This exam was performed according to  the departmental dose-optimization program which includes automated exposure control, adjustment of the mA and/or kV according to patient size and/or use of iterative reconstruction technique. CONTRAST:  62m OMNIPAQUE IOHEXOL 300 MG/ML  SOLN COMPARISON:  12/01/2018 FINDINGS: Lower chest: There are faint ground-glass densities in the lower lung fields suggesting scarring. There is no focal consolidation in the visualized lower lung fields. There is no pleural effusion. Hepatobiliary: No focal abnormalities are seen in liver. There is no significant dilation of bile ducts. Gallbladder is not seen. Pancreas: No focal abnormalities are seen. Spleen: Unremarkable. Adrenals/Urinary Tract: Mild hyperplasia of the adrenals has not changed. There is no hydronephrosis. Left kidney is smaller than right. There is no hydronephrosis. There are no renal or ureteral stones. Beam hardening artifacts limit evaluation of the bladder. Bladder is not distended. There is a possible 12 mm cyst in the midportion of right kidney. Stomach/Bowel: Small hiatal hernia is seen. Stomach is moderately distended. There is marked dilation of proximal small bowel loops measuring up to 5 cm in diameter. Distal small bowel lobes are decompressed. Zone of transition appears to be in lower mid abdomen slightly below the level of umbilicus. Colon is not distended. Scattered diverticula are seen in the colon without signs of focal acute diverticulitis. Appendix is not distinctly visualized. Vascular/Lymphatic: Scattered arterial calcifications are seen. Reproductive: Uterus is not seen. Other: There is no ascites or pneumoperitoneum. Musculoskeletal: There is mild anterolisthesis at L4-L5 level. Degenerative changes are noted in lumbar spine with spinal stenosis and encroachment of neural foramina at multiple levels, more so at L4-L5 level. There is previous left hip arthroplasty. IMPRESSION: There is marked dilation of proximal small bowel loops  suggesting high-grade partial small bowel obstruction. Zone of transition is noted in lower mid abdomen slightly below the level of umbilicus. Findings most likely suggest obstruction caused by adhesions. There is no pneumoperitoneum. There is no hydronephrosis. Diverticulosis of colon without signs of focal diverticulitis. Other findings as described in the body of the report. Electronically Signed   By: PElmer PickerM.D.   On: 07/11/2022 11:14    Procedures Procedures    Medications Ordered in ED Medications  ondansetron (ZOFRAN) injection 4 mg (4 mg Intravenous Given 07/11/22 0933)  iohexol (OMNIPAQUE) 300 MG/ML solution 100 mL (80 mLs Intravenous Contrast Given 07/11/22 1050)  cefTRIAXone (ROCEPHIN) 2 g in sodium chloride 0.9 % 100 mL IVPB (0 g Intravenous Stopped 07/11/22 1128)  sodium chloride 0.9 % bolus 1,000 mL (0 mLs Intravenous Stopped 07/11/22 1139)  ketamine (KETALAR) injection 10 mg (10 mg Intravenous Given 07/11/22 1132)    ED Course/ Medical Decision Making/ A&P  Medical Decision Making Amount and/or Complexity of Data Reviewed Labs: ordered. Radiology: ordered.  Risk Prescription drug management. Decision regarding hospitalization.   71 yo F with a chief complaints of nausea vomiting diarrhea.  This been going on for about 4 days now.  She is worried that she has a recurrent bowel obstruction.  She did just have a spinal injection for back pain.  She denies any fevers denies any back pain denies cauda equina signs or symptoms.  We will obtain a laboratory evaluation bolus of IV fluids antiemetics.  CT abdomen pelvis to assess for obstruction versus other intra-abdominal pathology.  The patient is noted to have a lactate>4. With the current information available to me, I don't think the patient is in septic shock. The lactate>4, is related to OTHER SHOCK bowel obstruction .  I will give a bolus dose of antibiotics with a white count but do not  have an obvious source, I suspect more likely this could be due to the bowel obstruction that she is concerned of.  We will hold off on giving a full 30 cc/kg of IV fluids.   CT scan is consistent with small bowel obstruction, high grade.  Discussed with gen surgery, Claiborne Billings, PA recommends ng tube, hospitalist admission, will eval on arrival.  CRITICAL CARE Performed by: Cecilio Asper   Total critical care time: 35 minutes  Critical care time was exclusive of separately billable procedures and treating other patients.  Critical care was necessary to treat or prevent imminent or life-threatening deterioration.  Critical care was time spent personally by me on the following activities: development of treatment plan with patient and/or surrogate as well as nursing, discussions with consultants, evaluation of patient's response to treatment, examination of patient, obtaining history from patient or surrogate, ordering and performing treatments and interventions, ordering and review of laboratory studies, ordering and review of radiographic studies, pulse oximetry and re-evaluation of patient's condition.  The patients results and plan were reviewed and discussed.   Any x-rays performed were independently reviewed by myself.   Differential diagnosis were considered with the presenting HPI.  Medications  ondansetron (ZOFRAN) injection 4 mg (4 mg Intravenous Given 07/11/22 0933)  iohexol (OMNIPAQUE) 300 MG/ML solution 100 mL (80 mLs Intravenous Contrast Given 07/11/22 1050)  cefTRIAXone (ROCEPHIN) 2 g in sodium chloride 0.9 % 100 mL IVPB (0 g Intravenous Stopped 07/11/22 1128)  sodium chloride 0.9 % bolus 1,000 mL (0 mLs Intravenous Stopped 07/11/22 1139)  ketamine (KETALAR) injection 10 mg (10 mg Intravenous Given 07/11/22 1132)    Vitals:   07/11/22 1030 07/11/22 1051 07/11/22 1100 07/11/22 1130  BP: (!) 175/94 (!) 147/101 (!) 151/97 (!) 160/86  Pulse: 87 86 87 88  Resp: '18 18 17 18   '$ Temp:      TempSrc:      SpO2: 98% 98% 98% 98%  Weight:      Height:        Final diagnoses:  Small bowel obstruction (HCC)    Admission/ observation were discussed with the admitting physician, patient and/or family and they are comfortable with the plan.         Final Clinical Impression(s) / ED Diagnoses Final diagnoses:  Small bowel obstruction [facility])    Rx / DC Orders ED Discharge Orders     None         Deno Etienne, DO 07/11/22 1149

## 2022-07-11 NOTE — Assessment & Plan Note (Signed)
Hold home meds while NPO.  IV hydralazine as needed.

## 2022-07-11 NOTE — Assessment & Plan Note (Signed)
High-grade partial SBO seen on CT imaging.  Likely secondary to adhesions.  Also question of possible enteritis given increased watery stools from baseline. -NG tube in place to LIWS -Keep n.p.o. -Continue IV fluid hydration overnight -Continue analgesics and antiemetics as needed -General surgery to follow

## 2022-07-11 NOTE — ED Triage Notes (Signed)
States had back injections on Monday, since then has not felt well, now having N/V, appears ill, very weak.

## 2022-07-11 NOTE — ED Notes (Signed)
ED Provider at bedside. 

## 2022-07-11 NOTE — Hospital Course (Signed)
Jordan [name] is a 71 y.o. 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

## 2022-07-11 NOTE — Assessment & Plan Note (Signed)
Secondary to volume depletion from GI losses.  Continue IV fluid hydration and repeat labs in AM.

## 2022-07-11 NOTE — Assessment & Plan Note (Signed)
Hold home diclofenac.  On IV Dilaudid as needed for SBO.

## 2022-07-11 NOTE — H&P (Signed)
History and Physical    Jordan [name] BHA:[id number] DOB: [birth year] DOA: 07/11/2022  PCP: [name], NP  Patient coming from: [facility]  I have personally briefly reviewed patient's old medical records in [place]  Chief Complaint: Nausea, vomiting  HPI: Jordan [name] is a 71 y.o. 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  Patient reports 1 day of frequent nausea, vomiting, and epigastric pain.  She reports chronic watery stools with increased frequency over the last 24 hours.  She has not been able to maintain any adequate oral intake.  She reports similar episodes in the past due to adhesive small bowel obstruction twice.  [facility] ED Course  Labs/Imaging on admission: I have personally reviewed following labs and imaging studies.  Initial vitals showed BP 164/115, pulse 104, RR 20, temp 97.6 F, SPO2 98% on room air.  Labs show WBC 11.6, hemoglobin 16.9, platelets 327,000, sodium 140, potassium 4.0, bicarb 23, BUN 46, creatinine 1.73, serum glucose 210, LFTs within normal limits, lipase 22, lactic acid 4.0 > 2.7.  CT abdomen/pelvis with contrast shows high-grade partial small bowel obstruction.  CT L-spine shows severe multilevel lumbar spondylosis similar to prior, no acute lumbar spine findings.  Patient was given 1 L normal saline, IV ceftriaxone, Zofran, Dilaudid.  NG tube was placed.  Follow-up abdominal x-ray showed tip of NG tube in the stomach.  General surgery were consulted and recommended medical admission and will see in consultation.  The hospitalist service was consulted to admit for further evaluation and management.  Review of Systems: All systems reviewed and are negative except as documented in history of present illness above.   Past Medical History:  Diagnosis Date   Acute hypoxemic respiratory  failure due to COVID-19 ([place]) 10/08/2020   Cellulitis and abscess of finger, unspecified 401.9   Cellulitis and abscess of foot, except toes    Depressive disorder, not elsewhere classified    GERD (gastroesophageal reflux disease)    Gout, unspecified    History of tobacco abuse 12/01/2018   Hyperglycemia 05/08/2011   Hyperlipidemia    borderline- not on meds   Hypertension    Insomnia    Leg edema    chronic   Obesity, unspecified    Osteoarthritis    Renal insufficiency, mild 05/08/2011   SBO (small bowel obstruction) ([place]) 12/01/2018   Small bowel obstruction ([place])     Past Surgical History:  Procedure Laterality Date   ABDOMINAL HYSTERECTOMY  2002   infection- no history of cancer   APPENDECTOMY  2002   catacact Right 11/01/2019   CHOLECYSTECTOMY  1977   ECTOPIC PREGNANCY SURGERY  1971   right shoulder rotator cuff repair  06-07-10   TOTAL HIP ARTHROPLASTY Left 1998    Social History:  reports that she quit smoking about 21 months ago. Her smoking use included cigarettes. She smoked an average of .25 packs per day. She has never used smokeless tobacco. She reports current alcohol use. She reports that she does not use drugs.  No Known Allergies  Family History  Problem Relation Age of Onset   Diabetes Mother    Cancer Mother        colon/ pancreatic   Colon cancer Mother 73   Hypertension Father    Alcohol abuse Father    Cirrhosis Father    Other Sister  Pyeoderma gangrenosis   Diabetes Sister        type 2   Kidney failure Sister    Liver disease Sister    Cirrhosis Sister        had NASH, died heart failure   Heart attack Brother    Stomach cancer Maternal Grandfather    Obesity Daughter    Rectal cancer Neg Hx    Esophageal cancer Neg Hx      Prior to Admission medications   Medication Sig Start Date End Date Taking? Authorizing Provider  allopurinol (ZYLOPRIM) 100 MG tablet Take 2 tablets (200 mg total) by mouth daily. 05/20/22    [name], NP  aspirin EC 81 MG tablet Take 81 mg by mouth daily.    [provider]  Cholecalciferol 25 MCG (1000 UT) capsule Take 1,000 Units by mouth daily.    [provider]  diclofenac (VOLTAREN) 75 MG EC tablet Take 1 tablet by mouth 2 times daily 06/21/22     DULoxetine (CYMBALTA) 60 MG capsule Take 1 capsule (60 mg total) by mouth daily. 05/20/22   [name], NP  furosemide (LASIX) 40 MG tablet TAKE 1 TABLET BY MOUTH ONCE DAILY AS NEEDED 03/09/21 03/09/22  [name], NP  Multiple Vitamin (MULTIVITAMIN WITH MINERALS) TABS tablet Take 1 tablet by mouth daily.    [provider]  pantoprazole (PROTONIX) 40 MG tablet TAKE 1 TABLET BY MOUTH ONCE DAILY 04/16/22   [name], NP  triamterene-hydrochlorothiazide (DYAZIDE) 37.5-25 MG capsule Take 1 capsule by mouth daily. 03/08/22   [name], NP  famotidine (PEPCID AC) 10 MG chewable tablet Chew 10 mg by mouth daily.  03/03/12  [provider]    Physical Exam: Vitals:   07/11/22 1630 07/11/22 1757 07/11/22 1843 07/11/22 1847  BP: (!) 156/100   (!) 148/95  Pulse: 86   89  Resp: 18   18  Temp:  (!) 97 F (36.1 C)  98.3 F (36.8 C)  TempSrc:  Axillary  Oral  SpO2: 95%   98%  Weight:   114.1 kg   Height:   '5\' 4"'$  (1.626 m)    Constitutional: Resting supine in bed, NAD, calm Eyes: EOMI, lids and conjunctivae normal ENMT: Mucous membranes are dry. Posterior pharynx clear of any exudate or lesions.Normal dentition.  NG tube in right nares. Neck: normal, supple, no masses. Respiratory: clear to auscultation anteriorly. Normal respiratory effort. No accessory muscle use.  Cardiovascular: Regular rate and rhythm, no murmurs / rubs / gallops. No extremity edema. 2+ pedal pulses. Abdomen: Epigastric tenderness, no masses palpated. No hepatosplenomegaly. Bowel sounds diminished.  Musculoskeletal: no clubbing / cyanosis. No joint deformity upper and lower extremities.  Good ROM, no contractures. Normal muscle tone.  Skin: no rashes, lesions, ulcers. No induration Neurologic: Sensation intact. Strength 5/5 in all 4.  Psychiatric: Normal judgment and insight. Alert and oriented x 3. Normal mood.   EKG: Not performed.  Assessment/Plan Principal Problem:   SBO (small bowel obstruction) (HCC) Active Problems:   Diabetes type 2, controlled ([place])   AKI (acute kidney injury) ([place])   Essential hypertension   Depression   Chronic pain syndrome   Jordan [name] is a 71 y.o. 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  Assessment and Plan: * SBO (small bowel obstruction) (HCC) High-grade partial SBO seen on CT imaging.  Likely secondary to adhesions.  Also question of  possible enteritis given increased watery stools from baseline. -NG tube in place to LIWS -Keep n.p.o. -Continue IV fluid hydration overnight -Continue analgesics and antiemetics as needed -General surgery to follow  AKI (acute kidney injury) ([place]) Secondary to volume depletion from GI losses.  Continue IV fluid hydration and repeat labs in AM.  Diabetes type 2, controlled ([place]) Hemoglobin A1c 6.9% on 03/08/2022.  Not currently on medical management. -Place on sensitive SSI q4h while n.p.o. and adjust as needed  Essential hypertension Hold home meds while NPO.  IV hydralazine as needed.  Chronic pain syndrome Hold home diclofenac.  On IV Dilaudid as needed for SBO.  Depression Hold home Cymbalta while NPO.  DVT prophylaxis:  Code Status: Full code, confirmed with patient on admission Family Communication: Discussed with patient's daughter at bedside Disposition Plan: From home, dispo pending clinical progress Consults called: General surgery Severity of Illness: The appropriate patient status for this patient is INPATIENT. Inpatient status is judged to be reasonable and necessary in order  to provide the required intensity of service to ensure the patient's safety. The patient's presenting symptoms, physical exam findings, and initial radiographic and laboratory data in the context of their chronic comorbidities is felt to place them at high risk for further clinical deterioration. Furthermore, it is not anticipated that the patient will be medically stable for discharge from the hospital within 2 midnights of admission.   * I certify that at the point of admission it is my clinical judgment that the patient will require inpatient hospital care spanning beyond 2 midnights from the point of admission due to high intensity of service, high risk for further deterioration and high frequency of surveillance required.Zada Finders MD [facility]  If 7PM-7AM, please contact night-coverage [email or web address]  07/11/2022, 8:54 PM

## 2022-07-11 NOTE — ED Notes (Signed)
NG TUBE, 14FR, VIA RT NARE X 1 ATTEMPT , TOLERATED VERY WELL, KUB ORDERED FOR VERIFICATION, AIR BOLUS 30ML ALSO USED TO CONFIRM PROPER PLACEMENT

## 2022-07-11 NOTE — ED Notes (Signed)
Patient transported to CT via stretcher, sr x 2 up, transported by radiology staff

## 2022-07-11 NOTE — ED Notes (Signed)
[facility] at the Bedside. 

## 2022-07-11 NOTE — ED Notes (Signed)
Phone Handoff Report given to [facility] RN at [place] Unit

## 2022-07-11 NOTE — ED Notes (Signed)
Care Handoff/Patient Report given to [facility] at this Time. All Questions Answered.

## 2022-07-11 NOTE — Assessment & Plan Note (Signed)
Hold home Cymbalta while NPO.

## 2022-07-11 NOTE — Consult Note (Signed)
Consult Note  Jordan [name] Aug 15, 1951  [id number].    Requesting MD: Deno Etienne, DO Chief Complaint/Reason for Consult: SBO  HPI:  Patient is a 71 year old female who presented to [facility] with 4 days of n/v/d. This started after getting injections in her back for chronic back pain. She reports hx of prior SBO and was concerned that this may be happening again. She has been having watery stools. She reports she had some symptoms about 1 month ago as well but this resolved with just some bowel rest. This time she became concerned when she started vomiting large amounts. Daughter at bedside this AM as well. She has had bowel function since NGT placement but still reports some cramping upper abdominal pain.   PMH otherwise significant for HTN, HLD, Mild renal insufficiency, GERD, Gout and Depression. Prior abdominal surgery includes surgery for an ectopic pregnancy in 1971, cholecystectomy x3 in 1977, abdominal hysterectomy and appendectomy. No blood thinners. NKDA.   ROS: Negative other than HPI  Family History  Problem Relation Age of Onset   Diabetes Mother    Cancer Mother        colon/ pancreatic   Colon cancer Mother 70   Hypertension Father    Alcohol abuse Father    Cirrhosis Father    Other Sister        Pyeoderma gangrenosis   Diabetes Sister        type 2   Kidney failure Sister    Liver disease Sister    Cirrhosis Sister        had NASH, died heart failure   Heart attack Brother    Stomach cancer Maternal Grandfather    Obesity Daughter    Rectal cancer Neg Hx    Esophageal cancer Neg Hx     Past Medical History:  Diagnosis Date   Acute hypoxemic respiratory failure due to COVID-19 ([place]) 10/08/2020   Cellulitis and abscess of finger, unspecified 401.9   Cellulitis and abscess of foot, except toes    Depressive disorder, not elsewhere classified    GERD (gastroesophageal reflux disease)    Gout, unspecified    History of tobacco abuse 12/01/2018    Hyperglycemia 05/08/2011   Hyperlipidemia    borderline- not on meds   Hypertension    Insomnia    Leg edema    chronic   Obesity, unspecified    Osteoarthritis    Renal insufficiency, mild 05/08/2011   SBO (small bowel obstruction) ([place]) 12/01/2018   Small bowel obstruction ([place])     Past Surgical History:  Procedure Laterality Date   ABDOMINAL HYSTERECTOMY  2002   infection- no history of cancer   APPENDECTOMY  2002   catacact Right 11/01/2019   CHOLECYSTECTOMY  1977   ECTOPIC PREGNANCY SURGERY  1971   right shoulder rotator cuff repair  06-07-10   TOTAL HIP ARTHROPLASTY Left 1998    Social History:  reports that she quit smoking about 21 months ago. Her smoking use included cigarettes. She smoked an average of .25 packs per day. She has never used smokeless tobacco. She reports current alcohol use. She reports that she does not use drugs.  Allergies: No Known Allergies  (Not in a hospital admission)   Blood pressure (!) 151/97, pulse 87, temperature 97.6 F (36.4 C), temperature source Oral, resp. rate 17, height '5\' 4"'$  (1.626 m), weight 112.4 kg, SpO2 98 %. Physical Exam:  General: pleasant, WD, obese female who is laying  in bed in NAD HEENT: head is normocephalic, atraumatic.  Sclera are noninjected. Ears and nose without any masses or lesions.  Mouth is pink and moist Heart: regular, rate, and rhythm.  Normal s1,s2. No obvious murmurs, gallops, or rubs noted.  Palpable radial and pedal pulses bilaterally Lungs: CTAB, no wheezes, rhonchi, or rales noted.  Respiratory effort nonlabored Abd: soft, mild ttp in epigastric abdomen without peritonitis, ND, +BS, multiple prior surgical scars well healed, NGT with coffee-ground drainage MS: all 4 extremities are symmetrical with no cyanosis, clubbing, or edema. Skin: warm and dry with no masses, lesions, or rashes Neuro: Cranial nerves 2-12 grossly intact, sensation is normal throughout Psych: A&Ox3 with an appropriate  affect.   Results for orders placed or performed during the hospital encounter of 07/11/22 (from the past 48 hour(s))  CBC with Differential     Status: Abnormal   Collection Time: 07/11/22  9:30 AM  Result Value Ref Range   WBC 11.6 (H) 4.0 - 10.5 K/uL   RBC 5.47 (H) 3.87 - 5.11 MIL/uL   Hemoglobin 16.9 (H) 12.0 - 15.0 g/dL   HCT 50.4 (H) 36.0 - 46.0 %   MCV 92.1 80.0 - 100.0 fL   MCH 30.9 26.0 - 34.0 pg   MCHC 33.5 30.0 - 36.0 g/dL   RDW 13.9 11.5 - 15.5 %   Platelets 327 150 - 400 K/uL   nRBC 0.0 0.0 - 0.2 %   Neutrophils Relative % 86 %   Neutro Abs 9.8 (H) 1.7 - 7.7 K/uL   Lymphocytes Relative 9 %   Lymphs Abs 1.1 0.7 - 4.0 K/uL   Monocytes Relative 4 %   Monocytes Absolute 0.5 0.1 - 1.0 K/uL   Eosinophils Relative 0 %   Eosinophils Absolute 0.0 0.0 - 0.5 K/uL   Basophils Relative 0 %   Basophils Absolute 0.1 0.0 - 0.1 K/uL   Immature Granulocytes 1 %   Abs Immature Granulocytes 0.15 (H) 0.00 - 0.07 K/uL    Comment: Performed at [facility], [address] [place], [place] [address]  Comprehensive metabolic panel     Status: Abnormal   Collection Time: 07/11/22  9:30 AM  Result Value Ref Range   Sodium 140 135 - 145 mmol/L   Potassium 4.0 3.5 - 5.1 mmol/L   Chloride 102 98 - 111 mmol/L   CO2 23 22 - 32 mmol/L   Glucose, Bld 210 (H) 70 - 99 mg/dL    Comment: Glucose reference range applies only to samples taken after fasting for at least 8 hours.   BUN 46 (H) 8 - 23 mg/dL   Creatinine, Ser 1.73 (H) 0.44 - 1.00 mg/dL   Calcium 10.1 8.9 - 10.3 mg/dL   Total Protein 9.0 (H) 6.5 - 8.1 g/dL   Albumin 5.0 3.5 - 5.0 g/dL   AST 31 15 - 41 U/L   ALT 32 0 - 44 U/L   Alkaline Phosphatase 120 38 - 126 U/L   Total Bilirubin 0.7 0.3 - 1.2 mg/dL   GFR, Estimated 31 (L) >60 mL/min    Comment: (NOTE) Calculated using the CKD-EPI Creatinine Equation (2021)    Anion gap 15 5 - 15    Comment: Performed at [facility], [address] [place], [place]  [address]  Lipase, blood     Status: None   Collection Time: 07/11/22  9:30 AM  Result Value Ref Range   Lipase 22 11 - 51 U/L  Comment: Performed at [facility], [address] [place], [place] [address]  Lactic acid, plasma     Status: Abnormal   Collection Time: 07/11/22  9:30 AM  Result Value Ref Range   Lactic Acid, Venous 4.0 (HH) 0.5 - 1.9 mmol/L    Comment: CRITICAL RESULT CALLED TO, READ BACK BY AND VERIFIED WITH MARVA SIMMS RN ON 07/11/22 AT [address] [facility] Performed at [facility], [address] [place], [place] [address]    CT L-SPINE NO CHARGE  Result Date: 07/11/2022 CLINICAL DATA:  Patient reports recent spinal injections (3 days ago) with subsequent malaise, nausea and vomiting. EXAM: CT LUMBAR SPINE WITHOUT CONTRAST TECHNIQUE: Multiplanar CT image reconstructions of the lumbar spine were generated from concurrent abdominopelvic CT dictated separately. RADIATION DOSE REDUCTION: This exam was performed according to the departmental dose-optimization program which includes automated exposure control, adjustment of the mA and/or kV according to patient size and/or use of iterative reconstruction technique. COMPARISON:  MRI lumbar spine 11/24/2021. FINDINGS: Segmentation: There are 5 lumbar type vertebral bodies. Alignment: Stable grade 1 degenerative anterolisthesis at L4-5. There is a mild convex left scoliosis. Vertebrae: No evidence of acute fracture or pars defect. There is multilevel spondylosis with advanced facet disease inferiorly manifesting as osteophytes, subchondral sclerosis and vacuum phenomenon. Sacroiliac degenerative changes are present bilaterally. Paraspinal and other soft tissues: Intra-abdominal findings are dictated separately. No acute paraspinal findings are seen. Disc levels: T12-L1: Degenerative disc disease with disc bulging, vacuum phenomenon and endplate osteophytes. Stable left foraminal narrowing. L1-2: Mild disc bulging and endplate  osteophyte formation. Mild facet and ligamentous hypertrophy. Stable mild to moderate spinal stenosis with mild lateral recess and foraminal narrowing bilaterally. L2-3: Chronic disc degeneration with annular disc bulging and endplate osteophytes asymmetric to the right. Vacuum phenomena. Moderate facet and ligamentous hypertrophy. Moderate multifactorial spinal stenosis with asymmetric right lateral recess and right foraminal narrowing. L3-4: Stable annular disc bulging with moderate facet and ligamentous hypertrophy. Mild to moderate spinal stenosis with asymmetric right lateral recess and right foraminal narrowing. L4-5: Loss of disc height with annular disc bulging. Severe bilateral facet hypertrophy accounting for the grade 1 anterolisthesis. Resulting severe multifactorial spinal stenosis with moderate to severe lateral recess and foraminal narrowing bilaterally. Probable chronic bilateral L4 nerve root encroachment. L5-S1: Mild disc bulging. Severe bilateral facet hypertrophy. No significant spinal stenosis. IMPRESSION: 1. No acute lumbar spine findings are identified. Specifically, no abnormal paraspinal or epidural fluid collections are seen after reported recent spinal injection. 2. Severe multi level lumbar spondylosis again noted, similar to previous MRI. There is spinal stenosis, lateral recess and foraminal narrowing at multiple levels, most advanced at L4-5. 3. No acute osseous findings. 4. Abdominopelvic CT findings dictated separately. Electronically Signed   By: Richardean Sale M.D.   On: 07/11/2022 11:27   CT ABDOMEN PELVIS W CONTRAST  Result Date: 07/11/2022 CLINICAL DATA:  Abdominal pain, nausea, vomiting EXAM: CT ABDOMEN AND PELVIS WITH CONTRAST TECHNIQUE: Multidetector CT imaging of the abdomen and pelvis was performed using the standard protocol following bolus administration of intravenous contrast. RADIATION DOSE REDUCTION: This exam was performed according to the departmental  dose-optimization program which includes automated exposure control, adjustment of the mA and/or kV according to patient size and/or use of iterative reconstruction technique. CONTRAST:  34m OMNIPAQUE IOHEXOL 300 MG/ML  SOLN COMPARISON:  12/01/2018 FINDINGS: Lower chest: There are faint ground-glass densities in the lower lung fields suggesting scarring. There is no focal consolidation in the visualized  lower lung fields. There is no pleural effusion. Hepatobiliary: No focal abnormalities are seen in liver. There is no significant dilation of bile ducts. Gallbladder is not seen. Pancreas: No focal abnormalities are seen. Spleen: Unremarkable. Adrenals/Urinary Tract: Mild hyperplasia of the adrenals has not changed. There is no hydronephrosis. Left kidney is smaller than right. There is no hydronephrosis. There are no renal or ureteral stones. Beam hardening artifacts limit evaluation of the bladder. Bladder is not distended. There is a possible 12 mm cyst in the midportion of right kidney. Stomach/Bowel: Small hiatal hernia is seen. Stomach is moderately distended. There is marked dilation of proximal small bowel loops measuring up to 5 cm in diameter. Distal small bowel lobes are decompressed. Zone of transition appears to be in lower mid abdomen slightly below the level of umbilicus. Colon is not distended. Scattered diverticula are seen in the colon without signs of focal acute diverticulitis. Appendix is not distinctly visualized. Vascular/Lymphatic: Scattered arterial calcifications are seen. Reproductive: Uterus is not seen. Other: There is no ascites or pneumoperitoneum. Musculoskeletal: There is mild anterolisthesis at L4-L5 level. Degenerative changes are noted in lumbar spine with spinal stenosis and encroachment of neural foramina at multiple levels, more so at L4-L5 level. There is previous left hip arthroplasty. IMPRESSION: There is marked dilation of proximal small bowel loops suggesting high-grade  partial small bowel obstruction. Zone of transition is noted in lower mid abdomen slightly below the level of umbilicus. Findings most likely suggest obstruction caused by adhesions. There is no pneumoperitoneum. There is no hydronephrosis. Diverticulosis of colon without signs of focal diverticulitis. Other findings as described in the body of the report. Electronically Signed   By: Elmer Picker M.D.   On: 07/11/2022 11:14      Assessment/Plan Enteritis vs SBO - CT today with marked dilation of proximal small bowel, transition felt to be lower mid abdomen below the umbilicus, no pneumoperitoneum or free fluid - WBC normalized, afebrile, HD stable  - lactic normalized after IV hydration  - abdominal exam with some ttp but no peritonitis - recommend SBO protocol, start PPI BID for bloody appearing NGT drainage - no indication for emergent surgical intervention  - check GI panel to r/o infectious enteritis as well   FEN: NPO, IVF, NGT to LIWS VTE: ok to have SQH or LMWH from a surgical standpoint  ID: rocephin given in ED  - below per primary team -  HTN HLD Mild renal insufficiency  GERD Gout  Depression  Chronic back pain  I reviewed ED provider notes, hospitalist notes, last 24 h vitals and pain scores, last 48 h intake and output, last 24 h labs and trends, and last 24 h imaging results.   Barkley Boards, [facility] 07/11/2022, 11:35 AM Please see Amion for pager number during day hours 7:00am-4:30pm

## 2022-07-11 NOTE — ED Notes (Signed)
ED MD informed of Lactic Acid level, additional orders for IVF and Abx rec and immediately implemented

## 2022-07-12 ENCOUNTER — Inpatient Hospital Stay (HOSPITAL_COMMUNITY): Payer: PPO

## 2022-07-12 DIAGNOSIS — K56609 Unspecified intestinal obstruction, unspecified as to partial versus complete obstruction: Secondary | ICD-10-CM | POA: Diagnosis not present

## 2022-07-12 LAB — CBC
HCT: 47.8 % — ABNORMAL HIGH (ref 36.0–46.0)
Hemoglobin: 16 g/dL — ABNORMAL HIGH (ref 12.0–15.0)
MCH: 31 pg (ref 26.0–34.0)
MCHC: 33.5 g/dL (ref 30.0–36.0)
MCV: 92.6 fL (ref 80.0–100.0)
Platelets: 218 10*3/uL (ref 150–400)
RBC: 5.16 MIL/uL — ABNORMAL HIGH (ref 3.87–5.11)
RDW: 14 % (ref 11.5–15.5)
WBC: 8.2 10*3/uL (ref 4.0–10.5)
nRBC: 0 % (ref 0.0–0.2)

## 2022-07-12 LAB — BASIC METABOLIC PANEL
Anion gap: 13 (ref 5–15)
BUN: 43 mg/dL — ABNORMAL HIGH (ref 8–23)
CO2: 22 mmol/L (ref 22–32)
Calcium: 9.2 mg/dL (ref 8.9–10.3)
Chloride: 109 mmol/L (ref 98–111)
Creatinine, Ser: 1.18 mg/dL — ABNORMAL HIGH (ref 0.44–1.00)
GFR, Estimated: 49 mL/min — ABNORMAL LOW (ref >60)
Glucose, Bld: 130 mg/dL — ABNORMAL HIGH (ref 70–99)
Potassium: 4.3 mmol/L (ref 3.5–5.1)
Sodium: 144 mmol/L (ref 135–145)

## 2022-07-12 LAB — GLUCOSE, CAPILLARY
Glucose-Capillary: 109 mg/dL — ABNORMAL HIGH (ref 70–99)
Glucose-Capillary: 130 mg/dL — ABNORMAL HIGH (ref 70–99)
Glucose-Capillary: 107 mg/dL — ABNORMAL HIGH (ref 70–99)
Glucose-Capillary: 90 mg/dL (ref 70–99)
Glucose-Capillary: 100 mg/dL — ABNORMAL HIGH (ref 70–99)

## 2022-07-12 LAB — MAGNESIUM: Magnesium: 2.2 mg/dL (ref 1.7–2.4)

## 2022-07-12 MED ORDER — PANTOPRAZOLE SODIUM 40 MG IV SOLR
40.0000 mg | Freq: Two times a day (BID) | INTRAVENOUS | Status: DC
  Administered 2022-07-12 – 2022-07-14 (×5): 40 mg via INTRAVENOUS
  Filled 2022-07-12 (×5): qty 10

## 2022-07-12 MED ORDER — MELATONIN 5 MG PO TABS
5.0000 mg | ORAL_TABLET | Freq: Every evening | ORAL | Status: DC | PRN
  Administered 2022-07-12 – 2022-07-13 (×2): 5 mg
  Filled 2022-07-12 (×2): qty 1

## 2022-07-12 MED ORDER — DIATRIZOATE MEGLUMINE & SODIUM 66-10 % PO SOLN
90.0000 mL | Freq: Once | ORAL | Status: AC
  Administered 2022-07-12: 90 mL via NASOGASTRIC
  Filled 2022-07-12: qty 90

## 2022-07-12 MED ORDER — SODIUM CHLORIDE 0.9 % IV SOLN: INTRAVENOUS | Status: AC

## 2022-07-12 NOTE — Progress Notes (Signed)
PROGRESS NOTE  Jordan [name] JYN:[id number] DOB: Feb 18, 1951 DOA: 07/11/2022 PCP: [name], NP   LOS: 1 day   Brief Narrative / Interim history: 71 year old female with DM2, HTN, HLD, prior history of SBO due to adhesions comes into the hospital with abdominal pain, nausea, vomiting.  She was diagnosed with an SBO based on CT scan of the abdomen and pelvis, had an NG tube placed and was admitted to the hospital.  General surgery was consulted.  Subjective / 24h Interval events: Still with abdominal cramping this morning.  NG tube is in place, overall feels a little bit better.  Assesement and Plan: Principal Problem:   SBO (small bowel obstruction) (HCC) Active Problems:   Diabetes type 2, controlled ([place])   AKI (acute kidney injury) ([place])   Essential hypertension   Depression   Chronic pain syndrome   Principal problem Small bowel obstruction-as evidenced per CT scan of the abdomen and pelvis.  She has prior history of small bowel obstructions in the past, and known adhesions.  General surgery consulted, appreciate input.  For now small bowel protocol, conservative management, n.p.o.  Active problems DM2-she has been placed on every 4 sliding scale.  Continue.  CBG (last 3)  Recent Labs    07/11/22 2347 07/12/22 0346 07/12/22 0735  GLUCAP 133* 109* 130*    Lab Results  Component Value Date   HGBA1C 6.9 (H) 03/08/2022   Essential hypertension-continue hydralazine as needed, now n.p.o.  Chronic pain syndrome-hold home oral agents, on IV Dilaudid  Acute kidney injury-secondary to volume depletion from poor p.o. intake and GI losses.  Renal function normalized with fluids  Depression-hold home Cymbalta while n.p.o.  Scheduled Meds:  diatrizoate meglumine-sodium  90 mL Per NG tube Once   enoxaparin (LOVENOX) injection  55 mg Subcutaneous Q24H   insulin aspart  0-9 Units Subcutaneous Q4H   pantoprazole (PROTONIX) IV  40 mg Intravenous Q12H   Continuous  Infusions: PRN Meds:.hydrALAZINE, HYDROmorphone (DILAUDID) injection, ondansetron (ZOFRAN) IV  Diet Orders (From admission, onward)     Start     Ordered   07/11/22 0931  Diet NPO time specified  Diet effective now        07/11/22 0930            DVT prophylaxis:    Lab Results  Component Value Date   PLT 218 07/12/2022      Code Status: Full Code  Family Communication: daughter at bedside  Status is: Inpatient  Remains inpatient appropriate because: persistent SBO  Level of care: Telemetry  Consultants:  General surgery   Objective: Vitals:   07/11/22 2244 07/12/22 0242 07/12/22 0445 07/12/22 0641  BP: (!) 154/76 (!) 160/101  (!) 161/93  Pulse: 79 76  80  Resp: '18 18  18  '$ Temp: 97.7 F (36.5 C) 97.7 F (36.5 C)  98.9 F (37.2 C)  TempSrc: Oral Oral  Oral  SpO2:  99%  96%  Weight:   112.7 kg   Height:        Intake/Output Summary (Last 24 hours) at 07/12/2022 1045 Last data filed at 07/12/2022 1009 Gross per 24 hour  Intake 120 ml  Output 2350 ml  Net -2230 ml   Wt Readings from Last 3 Encounters:  07/12/22 112.7 kg  03/20/22 121.1 kg  03/08/22 121.6 kg    Examination:  Constitutional: NAD Eyes: no scleral icterus ENMT: Mucous membranes are moist.  Neck: normal, supple Respiratory: clear to auscultation bilaterally, no wheezing, no crackles.  Cardiovascular: Regular rate and rhythm, no murmurs / rubs / gallops.  Abdomen: non distended, no tenderness. Bowel sounds positive.  Musculoskeletal: no clubbing / cyanosis.  Skin: no rashes Neurologic: non focal  Data Reviewed: I have independently reviewed following labs and imaging studies  CBC Recent Labs  Lab 07/11/22 0930 07/12/22 0457  WBC 11.6* 8.2  HGB 16.9* 16.0*  HCT 50.4* 47.8*  PLT 327 218  MCV 92.1 92.6  MCH 30.9 31.0  MCHC 33.5 33.5  RDW 13.9 14.0  LYMPHSABS 1.1  --   MONOABS 0.5  --   EOSABS 0.0  --   BASOSABS 0.1  --     Recent Labs  Lab 07/11/22 0930  07/11/22 1135 07/11/22 2256 07/12/22 0457  NA 140  --   --  144  K 4.0  --   --  4.3  CL 102  --   --  109  CO2 23  --   --  22  GLUCOSE 210*  --   --  130*  BUN 46*  --   --  43*  CREATININE 1.73*  --   --  1.18*  CALCIUM 10.1  --   --  9.2  AST 31  --   --   --   ALT 32  --   --   --   ALKPHOS 120  --   --   --   BILITOT 0.7  --   --   --   ALBUMIN 5.0  --   --   --   MG  --   --   --  2.2  LATICACIDVEN 4.0* 2.7* 1.7  --     ------------------------------------------------------------------------------------------------------------------ No results for input(s): "CHOL", "HDL", "LDLCALC", "TRIG", "CHOLHDL", "LDLDIRECT" in the last 72 hours.  Lab Results  Component Value Date   HGBA1C 6.9 (H) 03/08/2022   ------------------------------------------------------------------------------------------------------------------ No results for input(s): "TSH", "T4TOTAL", "T3FREE", "THYROIDAB" in the last 72 hours.  Invalid input(s): "FREET3"  Cardiac Enzymes No results for input(s): "CKMB", "TROPONINI", "MYOGLOBIN" in the last 168 hours.  Invalid input(s): "CK" ------------------------------------------------------------------------------------------------------------------    Component Value Date/Time   BNP 43.1 10/08/2020 1046    CBG: Recent Labs  Lab 07/11/22 2347 07/12/22 0346 07/12/22 0735  GLUCAP 133* 109* 130*    No results found for this or any previous visit (from the past 240 hour(s)).   Radiology Studies: DG Abdomen 1 View  Result Date: 07/11/2022 CLINICAL DATA:  NG tube placement EXAM: ABDOMEN - 1 VIEW COMPARISON:  Previous studies including the CT done earlier today FINDINGS: There is placement of NG tube with its tip in region of body of stomach. There is dilation of small-bowel loops suggesting high-grade small bowel obstruction. IMPRESSION: Tip of NG tube is seen in the stomach. Dilation of small-bowel loops suggests small bowel obstruction.  Electronically Signed   By: Elmer Picker M.D.   On: 07/11/2022 12:21   CT L-SPINE NO CHARGE  Result Date: 07/11/2022 CLINICAL DATA:  Patient reports recent spinal injections (3 days ago) with subsequent malaise, nausea and vomiting. EXAM: CT LUMBAR SPINE WITHOUT CONTRAST TECHNIQUE: Multiplanar CT image reconstructions of the lumbar spine were generated from concurrent abdominopelvic CT dictated separately. RADIATION DOSE REDUCTION: This exam was performed according to the departmental dose-optimization program which includes automated exposure control, adjustment of the mA and/or kV according to patient size and/or use of iterative reconstruction technique. COMPARISON:  MRI lumbar spine 11/24/2021. FINDINGS: Segmentation: There are 5 lumbar type vertebral bodies. Alignment:  Stable grade 1 degenerative anterolisthesis at L4-5. There is a mild convex left scoliosis. Vertebrae: No evidence of acute fracture or pars defect. There is multilevel spondylosis with advanced facet disease inferiorly manifesting as osteophytes, subchondral sclerosis and vacuum phenomenon. Sacroiliac degenerative changes are present bilaterally. Paraspinal and other soft tissues: Intra-abdominal findings are dictated separately. No acute paraspinal findings are seen. Disc levels: T12-L1: Degenerative disc disease with disc bulging, vacuum phenomenon and endplate osteophytes. Stable left foraminal narrowing. L1-2: Mild disc bulging and endplate osteophyte formation. Mild facet and ligamentous hypertrophy. Stable mild to moderate spinal stenosis with mild lateral recess and foraminal narrowing bilaterally. L2-3: Chronic disc degeneration with annular disc bulging and endplate osteophytes asymmetric to the right. Vacuum phenomena. Moderate facet and ligamentous hypertrophy. Moderate multifactorial spinal stenosis with asymmetric right lateral recess and right foraminal narrowing. L3-4: Stable annular disc bulging with moderate facet and  ligamentous hypertrophy. Mild to moderate spinal stenosis with asymmetric right lateral recess and right foraminal narrowing. L4-5: Loss of disc height with annular disc bulging. Severe bilateral facet hypertrophy accounting for the grade 1 anterolisthesis. Resulting severe multifactorial spinal stenosis with moderate to severe lateral recess and foraminal narrowing bilaterally. Probable chronic bilateral L4 nerve root encroachment. L5-S1: Mild disc bulging. Severe bilateral facet hypertrophy. No significant spinal stenosis. IMPRESSION: 1. No acute lumbar spine findings are identified. Specifically, no abnormal paraspinal or epidural fluid collections are seen after reported recent spinal injection. 2. Severe multi level lumbar spondylosis again noted, similar to previous MRI. There is spinal stenosis, lateral recess and foraminal narrowing at multiple levels, most advanced at L4-5. 3. No acute osseous findings. 4. Abdominopelvic CT findings dictated separately. Electronically Signed   By: Richardean Sale M.D.   On: 07/11/2022 11:27   CT ABDOMEN PELVIS W CONTRAST  Result Date: 07/11/2022 CLINICAL DATA:  Abdominal pain, nausea, vomiting EXAM: CT ABDOMEN AND PELVIS WITH CONTRAST TECHNIQUE: Multidetector CT imaging of the abdomen and pelvis was performed using the standard protocol following bolus administration of intravenous contrast. RADIATION DOSE REDUCTION: This exam was performed according to the departmental dose-optimization program which includes automated exposure control, adjustment of the mA and/or kV according to patient size and/or use of iterative reconstruction technique. CONTRAST:  13m OMNIPAQUE IOHEXOL 300 MG/ML  SOLN COMPARISON:  12/01/2018 FINDINGS: Lower chest: There are faint ground-glass densities in the lower lung fields suggesting scarring. There is no focal consolidation in the visualized lower lung fields. There is no pleural effusion. Hepatobiliary: No focal abnormalities are seen in  liver. There is no significant dilation of bile ducts. Gallbladder is not seen. Pancreas: No focal abnormalities are seen. Spleen: Unremarkable. Adrenals/Urinary Tract: Mild hyperplasia of the adrenals has not changed. There is no hydronephrosis. Left kidney is smaller than right. There is no hydronephrosis. There are no renal or ureteral stones. Beam hardening artifacts limit evaluation of the bladder. Bladder is not distended. There is a possible 12 mm cyst in the midportion of right kidney. Stomach/Bowel: Small hiatal hernia is seen. Stomach is moderately distended. There is marked dilation of proximal small bowel loops measuring up to 5 cm in diameter. Distal small bowel lobes are decompressed. Zone of transition appears to be in lower mid abdomen slightly below the level of umbilicus. Colon is not distended. Scattered diverticula are seen in the colon without signs of focal acute diverticulitis. Appendix is not distinctly visualized. Vascular/Lymphatic: Scattered arterial calcifications are seen. Reproductive: Uterus is not seen. Other: There is no ascites or pneumoperitoneum. Musculoskeletal: There is mild anterolisthesis at  L4-L5 level. Degenerative changes are noted in lumbar spine with spinal stenosis and encroachment of neural foramina at multiple levels, more so at L4-L5 level. There is previous left hip arthroplasty. IMPRESSION: There is marked dilation of proximal small bowel loops suggesting high-grade partial small bowel obstruction. Zone of transition is noted in lower mid abdomen slightly below the level of umbilicus. Findings most likely suggest obstruction caused by adhesions. There is no pneumoperitoneum. There is no hydronephrosis. Diverticulosis of colon without signs of focal diverticulitis. Other findings as described in the body of the report. Electronically Signed   By: Elmer Picker M.D.   On: 07/11/2022 11:14     Marzetta Board, MD, PhD [facility]  Between 7 am - 7 pm  I am available, please contact me via Amion (for emergencies) or Securechat (non urgent messages)  Between 7 pm - 7 am I am not available, please contact night coverage MD/APP via Amion

## 2022-07-12 NOTE — Progress Notes (Signed)
Initial Nutrition Assessment  DOCUMENTATION CODES:   Obesity unspecified  INTERVENTION:   -RD will follow for diet advancement and follow for diet advancement  NUTRITION DIAGNOSIS:   Inadequate oral intake related to altered GI function as evidenced by NPO status.  GOAL:   Patient will meet greater than or equal to 90% of their needs  MONITOR:   PO intake, Supplement acceptance, Diet advancement  REASON FOR ASSESSMENT:   Malnutrition Screening Tool    ASSESSMENT:   Pt with DM2, HTN, HLD, prior history of SBO due to adhesions comes into the hospital with abdominal pain, nausea, vomiting.  She was diagnosed with an SBO based on CT scan of the abdomen and pelvis, had an NG tube placed.  Pt admitted with SBO.   7/20- NGT placed for decompression 7/21- CT revealed marked dilation of proximal small bowel, transition felt to be lower mid abdomen below the umbilicus, no pneumoperitoneum or free fluid  Reviewed I/O's: -1.7 L x 24 hours  UOP: 600 ml x 24 hours  NGT: 600 ml x 24 hours   Pt unavailable at time of visit. Attempted to speak with pt via call to hospital room phone, however, unable to reach. RD unable to obtain further nutrition-related history or complete nutrition-focused physical exam at this time.    Per general surgery notes, PPI started today due to bloody drainage from NGT.   Reviewed wt hx; pt has experienced a 10% wt loss over the past year, which is not significant for time frame.   Medications reviewed and include 0.9% sodium chloride infusion @ 125 ml/hr.    Lab Results  Component Value Date   HGBA1C 6.9 (H) 03/08/2022   PTA DM medications are none.   Labs reviewed: CBGS: 107-130 (inpatient orders for glycemic control are 0-9 units insulin aspart every 4 hours).    Diet Order:   Diet Order             Diet NPO time specified  Diet effective now                   EDUCATION NEEDS:   No education needs have been identified at this  time  Skin:  Skin Assessment: Reviewed RN Assessment  Last BM:  07/12/22  Height:   Ht Readings from Last 1 Encounters:  07/11/22 '5\' 4"'$  (1.626 m)    Weight:   Wt Readings from Last 1 Encounters:  07/12/22 112.7 kg    Ideal Body Weight:  54.5 kg  BMI:  Body mass index is 42.65 kg/m.  Estimated Nutritional Needs:   Kcal:  1700-1900  Protein:  95-110 grams  Fluid:  > 1.7 L    Loistine Chance, RD, LDN, [place] Registered Dietitian II Certified Diabetes Care and Education Specialist Please refer to [facility] for RD and/or RD on-call/weekend/after hours pager

## 2022-07-12 NOTE — Plan of Care (Signed)

## 2022-07-12 NOTE — Plan of Care (Signed)
  Problem: Education: Goal: Knowledge of General Education information will improve Description Including pain rating scale, medication(s)/side effects and non-pharmacologic comfort measures Outcome: Progressing   

## 2022-07-12 NOTE — Progress Notes (Signed)
  Transition of Care [facility]) Screening Note   Patient Details  Name: Jordan [name] Date of Birth: 24-Jun-1951   Transition of Care [facility]) CM/SW Contact:    Vassie Moselle, LCSW Phone Number: 07/12/2022, 3:00 PM    Transition of Care Department [facility]) has reviewed patient and no TOC needs have been identified at this time. We will continue to monitor patient advancement through interdisciplinary progression rounds. If new patient transition needs arise, please place a TOC consult.

## 2022-07-13 DIAGNOSIS — K56609 Unspecified intestinal obstruction, unspecified as to partial versus complete obstruction: Secondary | ICD-10-CM | POA: Diagnosis not present

## 2022-07-13 LAB — GASTROINTESTINAL PANEL BY PCR, STOOL (REPLACES STOOL CULTURE)

## 2022-07-13 LAB — COMPREHENSIVE METABOLIC PANEL
ALT: 40 U/L (ref 0–44)
AST: 34 U/L (ref 15–41)
Albumin: 3.8 g/dL (ref 3.5–5.0)
Alkaline Phosphatase: 84 U/L (ref 38–126)
Anion gap: 11 (ref 5–15)
BUN: 36 mg/dL — ABNORMAL HIGH (ref 8–23)
CO2: 21 mmol/L — ABNORMAL LOW (ref 22–32)
Calcium: 9 mg/dL (ref 8.9–10.3)
Chloride: 114 mmol/L — ABNORMAL HIGH (ref 98–111)
Creatinine, Ser: 0.94 mg/dL (ref 0.44–1.00)
GFR, Estimated: >60 mL/min (ref >60)
Glucose, Bld: 102 mg/dL — ABNORMAL HIGH (ref 70–99)
Potassium: 3.5 mmol/L (ref 3.5–5.1)
Sodium: 146 mmol/L — ABNORMAL HIGH (ref 135–145)
Total Bilirubin: 0.7 mg/dL (ref 0.3–1.2)
Total Protein: 6.6 g/dL (ref 6.5–8.1)

## 2022-07-13 LAB — GLUCOSE, CAPILLARY
Glucose-Capillary: 109 mg/dL — ABNORMAL HIGH (ref 70–99)
Glucose-Capillary: 101 mg/dL — ABNORMAL HIGH (ref 70–99)
Glucose-Capillary: 89 mg/dL (ref 70–99)
Glucose-Capillary: 111 mg/dL — ABNORMAL HIGH (ref 70–99)
Glucose-Capillary: 88 mg/dL (ref 70–99)
Glucose-Capillary: 139 mg/dL — ABNORMAL HIGH (ref 70–99)

## 2022-07-13 LAB — CBC
HCT: 46.9 % — ABNORMAL HIGH (ref 36.0–46.0)
Hemoglobin: 15.1 g/dL — ABNORMAL HIGH (ref 12.0–15.0)
MCH: 31.4 pg (ref 26.0–34.0)
MCHC: 32.2 g/dL (ref 30.0–36.0)
MCV: 97.5 fL (ref 80.0–100.0)
Platelets: 204 10*3/uL (ref 150–400)
RBC: 4.81 MIL/uL (ref 3.87–5.11)
RDW: 13.8 % (ref 11.5–15.5)
WBC: 6.8 10*3/uL (ref 4.0–10.5)
nRBC: 0 % (ref 0.0–0.2)

## 2022-07-13 MED ORDER — VITAMIN D 25 MCG (1000 UNIT) PO TABS
1000.0000 [IU] | ORAL_TABLET | Freq: Every day | ORAL | Status: DC
  Administered 2022-07-13 – 2022-07-14 (×2): 1000 [IU] via ORAL
  Filled 2022-07-13 (×2): qty 1

## 2022-07-13 MED ORDER — ADULT MULTIVITAMIN W/MINERALS CH
1.0000 | ORAL_TABLET | Freq: Every day | ORAL | Status: DC
  Administered 2022-07-13 – 2022-07-14 (×2): 1 via ORAL
  Filled 2022-07-13 (×2): qty 1

## 2022-07-13 MED ORDER — ASPIRIN 81 MG PO TBEC
81.0000 mg | DELAYED_RELEASE_TABLET | Freq: Every day | ORAL | Status: DC
  Administered 2022-07-13 – 2022-07-14 (×2): 81 mg via ORAL
  Filled 2022-07-13 (×2): qty 1

## 2022-07-13 MED ORDER — ALLOPURINOL 100 MG PO TABS
200.0000 mg | ORAL_TABLET | Freq: Every day | ORAL | Status: DC
  Administered 2022-07-13 – 2022-07-14 (×2): 200 mg via ORAL
  Filled 2022-07-13 (×2): qty 2

## 2022-07-13 MED ORDER — INSULIN ASPART 100 UNIT/ML IJ SOLN
0.0000 [IU] | Freq: Three times a day (TID) | INTRAMUSCULAR | Status: DC
  Administered 2022-07-13: 1 [IU] via SUBCUTANEOUS

## 2022-07-13 MED ORDER — DULOXETINE HCL 30 MG PO CPEP
60.0000 mg | ORAL_CAPSULE | Freq: Every day | ORAL | Status: DC
  Administered 2022-07-13 – 2022-07-14 (×2): 60 mg via ORAL
  Filled 2022-07-13 (×2): qty 2

## 2022-07-13 NOTE — Progress Notes (Signed)
Subjective/Chief Complaint: Patient reports less abdominal discomfort and bloating Has had several liquid bowel movements No nausea or vomiting Plain films - contrast is completely in the colon GI panel positive for Rotavirus  Objective: Vital signs in last 24 hours: Temp:  [97.8 F (36.6 C)-98.3 F (36.8 C)] 98.3 F (36.8 C) (07/21 2027) Pulse Rate:  [64-78] 64 (07/22 0619) Resp:  [18-20] 18 (07/22 0619) BP: (138-152)/(84-90) 152/90 (07/22 0619) SpO2:  [92 %-100 %] 100 % (07/22 0619) Weight:  [112.5 kg] 112.5 kg (07/22 0500) Last BM Date : 07/12/22  Intake/Output from previous day: 07/21 0701 - 07/22 0700 In: 2146.4 [I.V.:2146.4] Out: 1800 [Emesis/NG output:1800] Intake/Output this shift: No intake/output data recorded.  General appearance: alert, cooperative, and no distress GI: less distended , non-tender;   Lab Results:  Recent Labs    07/11/22 0930 07/12/22 0457  WBC 11.6* 8.2  HGB 16.9* 16.0*  HCT 50.4* 47.8*  PLT 327 218   BMET Recent Labs    07/11/22 0930 07/12/22 0457  NA 140 144  K 4.0 4.3  CL 102 109  CO2 23 22  GLUCOSE 210* 130*  BUN 46* 43*  CREATININE 1.73* 1.18*  CALCIUM 10.1 9.2   PT/INR No results for input(s): "LABPROT", "INR" in the last 72 hours. ABG No results for input(s): "PHART", "HCO3" in the last 72 hours.  Invalid input(s): "PCO2", "PO2"  Studies/Results: DG Abd Portable 1V-Small Bowel Obstruction Protocol-initial, 8 hr delay  Result Date: 07/12/2022 CLINICAL DATA:  8 hour delay small bowel obstruction. EXAM: PORTABLE ABDOMEN - 1 VIEW COMPARISON:  July 11, 2022 FINDINGS: There is stable nasogastric tube positioning. Multiple dilated loops of air-filled small bowel are again seen. Radiopaque contrast is noted within the descending and sigmoid colon. No radio-opaque calculi or other significant radiographic abnormality are seen. 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 IMPRESSION: 1.  Persistently dilated air-filled small bowel consistent with a small bowel obstruction. 2. Radiopaque contrast is noted within the descending and sigmoid colon. Electronically Signed   By: Virgina Norfolk M.D.   On: 07/12/2022 21:35   DG Abdomen 1 View  Result Date: 07/11/2022 CLINICAL DATA:  NG tube placement EXAM: ABDOMEN - 1 VIEW COMPARISON:  Previous studies including the CT done earlier today FINDINGS: There is placement of NG tube with its tip in region of body of stomach. There is dilation of small-bowel loops suggesting high-grade small bowel obstruction. IMPRESSION: Tip of NG tube is seen in the stomach. Dilation of small-bowel loops suggests small bowel obstruction. Electronically Signed   By: Elmer Picker M.D.   On: 07/11/2022 12:21   CT L-SPINE NO CHARGE  Result Date: 07/11/2022 CLINICAL DATA:  Patient reports recent spinal injections (3 days ago) with subsequent malaise, nausea and vomiting. EXAM: CT LUMBAR SPINE WITHOUT CONTRAST TECHNIQUE: Multiplanar CT image reconstructions of the lumbar spine were generated from concurrent abdominopelvic CT dictated separately. RADIATION DOSE REDUCTION: This exam was performed according to the departmental dose-optimization program which includes automated exposure control, adjustment of the mA and/or kV according to patient size and/or use of iterative reconstruction technique. COMPARISON:  MRI lumbar spine 11/24/2021. FINDINGS: Segmentation: There are 5 lumbar type vertebral bodies. Alignment: Stable grade 1 degenerative anterolisthesis at L4-5. There is a mild convex left scoliosis. Vertebrae: No evidence of acute fracture or pars defect. There is multilevel spondylosis with advanced facet disease inferiorly manifesting as osteophytes, subchondral sclerosis and vacuum phenomenon. Sacroiliac degenerative changes are present bilaterally.  Paraspinal and other soft tissues: Intra-abdominal findings are dictated separately. No acute paraspinal findings  are seen. Disc levels: T12-L1: Degenerative disc disease with disc bulging, vacuum phenomenon and endplate osteophytes. Stable left foraminal narrowing. L1-2: Mild disc bulging and endplate osteophyte formation. Mild facet and ligamentous hypertrophy. Stable mild to moderate spinal stenosis with mild lateral recess and foraminal narrowing bilaterally. L2-3: Chronic disc degeneration with annular disc bulging and endplate osteophytes asymmetric to the right. Vacuum phenomena. Moderate facet and ligamentous hypertrophy. Moderate multifactorial spinal stenosis with asymmetric right lateral recess and right foraminal narrowing. L3-4: Stable annular disc bulging with moderate facet and ligamentous hypertrophy. Mild to moderate spinal stenosis with asymmetric right lateral recess and right foraminal narrowing. L4-5: Loss of disc height with annular disc bulging. Severe bilateral facet hypertrophy accounting for the grade 1 anterolisthesis. Resulting severe multifactorial spinal stenosis with moderate to severe lateral recess and foraminal narrowing bilaterally. Probable chronic bilateral L4 nerve root encroachment. L5-S1: Mild disc bulging. Severe bilateral facet hypertrophy. No significant spinal stenosis. IMPRESSION: 1. No acute lumbar spine findings are identified. Specifically, no abnormal paraspinal or epidural fluid collections are seen after reported recent spinal injection. 2. Severe multi level lumbar spondylosis again noted, similar to previous MRI. There is spinal stenosis, lateral recess and foraminal narrowing at multiple levels, most advanced at L4-5. 3. No acute osseous findings. 4. Abdominopelvic CT findings dictated separately. Electronically Signed   By: Richardean Sale M.D.   On: 07/11/2022 11:27   CT ABDOMEN PELVIS W CONTRAST  Result Date: 07/11/2022 CLINICAL DATA:  Abdominal pain, nausea, vomiting EXAM: CT ABDOMEN AND PELVIS WITH CONTRAST TECHNIQUE: Multidetector CT imaging of the abdomen and  pelvis was performed using the standard protocol following bolus administration of intravenous contrast. RADIATION DOSE REDUCTION: This exam was performed according to the departmental dose-optimization program which includes automated exposure control, adjustment of the mA and/or kV according to patient size and/or use of iterative reconstruction technique. CONTRAST:  25m OMNIPAQUE IOHEXOL 300 MG/ML  SOLN COMPARISON:  12/01/2018 FINDINGS: Lower chest: There are faint ground-glass densities in the lower lung fields suggesting scarring. There is no focal consolidation in the visualized lower lung fields. There is no pleural effusion. Hepatobiliary: No focal abnormalities are seen in liver. There is no significant dilation of bile ducts. Gallbladder is not seen. Pancreas: No focal abnormalities are seen. Spleen: Unremarkable. Adrenals/Urinary Tract: Mild hyperplasia of the adrenals has not changed. There is no hydronephrosis. Left kidney is smaller than right. There is no hydronephrosis. There are no renal or ureteral stones. Beam hardening artifacts limit evaluation of the bladder. Bladder is not distended. There is a possible 12 mm cyst in the midportion of right kidney. Stomach/Bowel: Small hiatal hernia is seen. Stomach is moderately distended. There is marked dilation of proximal small bowel loops measuring up to 5 cm in diameter. Distal small bowel lobes are decompressed. Zone of transition appears to be in lower mid abdomen slightly below the level of umbilicus. Colon is not distended. Scattered diverticula are seen in the colon without signs of focal acute diverticulitis. Appendix is not distinctly visualized. Vascular/Lymphatic: Scattered arterial calcifications are seen. Reproductive: Uterus is not seen. Other: There is no ascites or pneumoperitoneum. Musculoskeletal: There is mild anterolisthesis at L4-L5 level. Degenerative changes are noted in lumbar spine with spinal stenosis and encroachment of neural  foramina at multiple levels, more so at L4-L5 level. There is previous left hip arthroplasty. IMPRESSION: There is marked dilation of proximal small bowel loops suggesting high-grade partial small  bowel obstruction. Zone of transition is noted in lower mid abdomen slightly below the level of umbilicus. Findings most likely suggest obstruction caused by adhesions. There is no pneumoperitoneum. There is no hydronephrosis. Diverticulosis of colon without signs of focal diverticulitis. Other findings as described in the body of the report. Electronically Signed   By: Elmer Picker M.D.   On: 07/11/2022 11:14    Anti-infectives: Anti-infectives (From admission, onward)    Start     Dose/Rate Route Frequency Ordered Stop   07/11/22 1030  cefTRIAXone (ROCEPHIN) 2 g in sodium chloride 0.9 % 100 mL IVPB        2 g 200 mL/hr over 30 Minutes Intravenous  Once 07/11/22 1029 07/11/22 1128       Assessment/Plan: Viral enteritis - Rotavirus Bowel function improving Less tender D/ C ng tube Clear liquids - advance as tolerated  Possible discharge tomorrow.   LOS: 2 days    Jordan [name] 07/13/2022

## 2022-07-13 NOTE — Progress Notes (Signed)
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  She was diagnosed with an SBO based on CT scan of the abdomen and pelvis, had an NG tube placed and was admitted to the hospital.  General surgery was consulted.  Subjective / 24h Interval events: Has been having bowel movements.  Feeling better.  NG tube has been removed  Assesement and Plan: Principal Problem:   SBO (small bowel obstruction) ([place]) Active Problems:   Diabetes type 2, controlled ([place])   AKI (acute kidney injury) ([place])   Essential hypertension   Depression   Chronic pain syndrome   Principal problem Small bowel obstruction-as evidenced per CT scan of the abdomen and pelvis.  She has prior history of small bowel obstructions in the past, and known adhesions.  General surgery consulted, appreciate input.  Now seems to be resolving, contrast is in the colon.  Remove NG tube, advance diet as tolerated.  Anticipate home tomorrow if no further complications  Active problems DM2-changed to [facility] nightly  CBG (last 3)  Recent Labs    07/13/22 0402 07/13/22 0821 07/13/22 1148  GLUCAP 101* 89 111*     Lab Results  Component Value Date   HGBA1C 6.9 (H) 03/08/2022   Essential hypertension-continue hydralazine as needed  Rotavirus enteritis-diarrhea prior to admission, continue supportive treatment.  Acute kidney injury-secondary to volume depletion from poor p.o. intake and GI losses.  Renal function stable.  Continue to hold home furosemide and blood pressure medications  Depression-resume Cymbalta today  Scheduled Meds:  allopurinol  200 mg Oral Daily   aspirin EC  81 mg Oral Daily   Cholecalciferol  1,000 Units Oral Daily   DULoxetine  60 mg Oral Daily   enoxaparin  (LOVENOX) injection  55 mg Subcutaneous Q24H   insulin aspart  0-9 Units Subcutaneous Q4H   multivitamin with minerals  1 tablet Oral Daily   pantoprazole (PROTONIX) IV  40 mg Intravenous Q12H   Continuous Infusions:  sodium chloride Stopped (07/13/22 0904)   PRN Meds:.hydrALAZINE, HYDROmorphone (DILAUDID) injection, melatonin, ondansetron (ZOFRAN) IV  Diet Orders (From admission, onward)     Start     Ordered   07/13/22 0822  Diet clear liquid Room service appropriate? Yes; Fluid consistency: Thin  Diet effective now       Question Answer Comment  Room service appropriate? Yes   Fluid consistency: Thin      07/13/22 0821            DVT prophylaxis:    Lab Results  Component Value Date   PLT 204 07/13/2022      Code Status: Full Code  Family Communication: daughter at bedside  Status is: Inpatient  Remains inpatient appropriate because: persistent SBO  Level of care: Telemetry  Consultants:  General surgery   Objective: Vitals:   07/12/22 1348 07/12/22 2027 07/13/22 0500 07/13/22 0619  BP: 138/84 140/89  (!) 152/90  Pulse: 75 78  64  Resp: '18 20  18  '$ Temp: 97.8 F (36.6 C) 98.3 F (36.8 C)    TempSrc: Oral Oral    SpO2: 95% 92%  100%  Weight:   112.5 kg   Height:        Intake/Output Summary (Last 24 hours) at 07/13/2022 1222 Last  data filed at 07/13/2022 0900 Gross per 24 hour  Intake 2506.37 ml  Output 1600 ml  Net 906.37 ml    Wt Readings from Last 3 Encounters:  07/13/22 112.5 kg  03/20/22 121.1 kg  03/08/22 121.6 kg    Examination:  Constitutional: NAD Eyes: lids and conjunctivae normal, no scleral icterus ENMT: mmm Neck: normal, supple Respiratory: clear to auscultation bilaterally, no wheezing, no crackles. Normal respiratory effort.  Cardiovascular: Regular rate and rhythm, no murmurs / rubs / gallops.  Abdomen: soft, no distention, no tenderness. Bowel sounds positive.  Skin: no rashes Neurologic: no focal deficits, equal  strength  Data Reviewed: I have independently reviewed following labs and imaging studies  CBC Recent Labs  Lab 07/11/22 0930 07/12/22 0457 07/13/22 0856  WBC 11.6* 8.2 6.8  HGB 16.9* 16.0* 15.1*  HCT 50.4* 47.8* 46.9*  PLT 327 218 204  MCV 92.1 92.6 97.5  MCH 30.9 31.0 31.4  MCHC 33.5 33.5 32.2  RDW 13.9 14.0 13.8  LYMPHSABS 1.1  --   --   MONOABS 0.5  --   --   EOSABS 0.0  --   --   BASOSABS 0.1  --   --      Recent Labs  Lab 07/11/22 0930 07/11/22 1135 07/11/22 2256 07/12/22 0457 07/13/22 0856  NA 140  --   --  144 146*  K 4.0  --   --  4.3 3.5  CL 102  --   --  109 114*  CO2 23  --   --  22 21*  GLUCOSE 210*  --   --  130* 102*  BUN 46*  --   --  43* 36*  CREATININE 1.73*  --   --  1.18* 0.94  CALCIUM 10.1  --   --  9.2 9.0  AST 31  --   --   --  34  ALT 32  --   --   --  40  ALKPHOS 120  --   --   --  84  BILITOT 0.7  --   --   --  0.7  ALBUMIN 5.0  --   --   --  3.8  MG  --   --   --  2.2  --   LATICACIDVEN 4.0* 2.7* 1.7  --   --      ------------------------------------------------------------------------------------------------------------------ No results for input(s): "CHOL", "HDL", "LDLCALC", "TRIG", "CHOLHDL", "LDLDIRECT" in the last 72 hours.  Lab Results  Component Value Date   HGBA1C 6.9 (H) 03/08/2022   ------------------------------------------------------------------------------------------------------------------ No results for input(s): "TSH", "T4TOTAL", "T3FREE", "THYROIDAB" in the last 72 hours.  Invalid input(s): "FREET3"  Cardiac Enzymes No results for input(s): "CKMB", "TROPONINI", "MYOGLOBIN" in the last 168 hours.  Invalid input(s): "CK" ------------------------------------------------------------------------------------------------------------------    Component Value Date/Time   BNP 43.1 10/08/2020 1046    CBG: Recent Labs  Lab 07/12/22 2023 07/13/22 0133 07/13/22 0402 07/13/22 0821 07/13/22 1148  GLUCAP 100*  109* 101* 89 111*     Recent Results (from the past 240 hour(s))  Gastrointestinal Panel by PCR , Stool     Status: Abnormal   Collection Time: 07/12/22 10:29 AM   Specimen: Stool  Result Value Ref Range Status   Campylobacter species NOT DETECTED NOT DETECTED Final   Plesimonas shigelloides NOT DETECTED NOT DETECTED Final   Salmonella species NOT DETECTED NOT DETECTED Final   Yersinia enterocolitica NOT DETECTED NOT DETECTED Final   Vibrio species NOT DETECTED NOT DETECTED  Final   Vibrio cholerae NOT DETECTED NOT DETECTED Final   Enteroaggregative E coli (EAEC) NOT DETECTED NOT DETECTED Final   Enteropathogenic E coli (EPEC) NOT DETECTED NOT DETECTED Final   Enterotoxigenic E coli (ETEC) NOT DETECTED NOT DETECTED Final   Shiga like toxin producing E coli (STEC) NOT DETECTED NOT DETECTED Final   Shigella/Enteroinvasive E coli (EIEC) NOT DETECTED NOT DETECTED Final   Cryptosporidium NOT DETECTED NOT DETECTED Final   Cyclospora cayetanensis NOT DETECTED NOT DETECTED Final   Entamoeba histolytica NOT DETECTED NOT DETECTED Final   Giardia lamblia NOT DETECTED NOT DETECTED Final   Adenovirus F40/41 NOT DETECTED NOT DETECTED Final   Astrovirus NOT DETECTED NOT DETECTED Final   Norovirus GI/GII NOT DETECTED NOT DETECTED Final   Rotavirus A DETECTED (A) NOT DETECTED Final   Sapovirus (I, II, IV, and V) NOT DETECTED NOT DETECTED Final    Comment: Performed at [facility], [address], [place], [place] [address]     Radiology Studies: DG Abd Portable 1V-Small Bowel Obstruction Protocol-initial, 8 hr delay  Result Date: 07/12/2022 CLINICAL DATA:  8 hour delay small bowel obstruction. EXAM: PORTABLE ABDOMEN - 1 VIEW COMPARISON:  July 11, 2022 FINDINGS: There is stable nasogastric tube positioning. Multiple dilated loops of air-filled small bowel are again seen. Radiopaque contrast is noted within the descending and sigmoid colon. No radio-opaque calculi or other significant  radiographic abnormality are seen. 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 IMPRESSION: 1. Persistently dilated air-filled small bowel consistent with a small bowel obstruction. 2. Radiopaque contrast is noted within the descending and sigmoid colon. Electronically Signed   By: Virgina Norfolk M.D.   On: 07/12/2022 21:35     Marzetta Board, MD, PhD [facility]  Between 7 am - 7 pm I am available, please contact me via Amion (for emergencies) or Securechat (non urgent messages)  Between 7 pm - 7 am I am not available, please contact night coverage MD/APP via Amion

## 2022-07-14 DIAGNOSIS — K56609 Unspecified intestinal obstruction, unspecified as to partial versus complete obstruction: Secondary | ICD-10-CM | POA: Diagnosis not present

## 2022-07-14 LAB — GLUCOSE, CAPILLARY
Glucose-Capillary: 104 mg/dL — ABNORMAL HIGH (ref 70–99)
Glucose-Capillary: 100 mg/dL — ABNORMAL HIGH (ref 70–99)

## 2022-07-14 LAB — BASIC METABOLIC PANEL
Anion gap: 8 (ref 5–15)
BUN: 28 mg/dL — ABNORMAL HIGH (ref 8–23)
CO2: 23 mmol/L (ref 22–32)
Calcium: 8.8 mg/dL — ABNORMAL LOW (ref 8.9–10.3)
Chloride: 110 mmol/L (ref 98–111)
Creatinine, Ser: 0.76 mg/dL (ref 0.44–1.00)
GFR, Estimated: >60 mL/min (ref >60)
Glucose, Bld: 114 mg/dL — ABNORMAL HIGH (ref 70–99)
Potassium: 3.6 mmol/L (ref 3.5–5.1)
Sodium: 141 mmol/L (ref 135–145)

## 2022-07-14 LAB — CBC
HCT: 40.9 % (ref 36.0–46.0)
Hemoglobin: 13.7 g/dL (ref 12.0–15.0)
MCH: 31.4 pg (ref 26.0–34.0)
MCHC: 33.5 g/dL (ref 30.0–36.0)
MCV: 93.6 fL (ref 80.0–100.0)
Platelets: 191 10*3/uL (ref 150–400)
RBC: 4.37 MIL/uL (ref 3.87–5.11)
RDW: 13.5 % (ref 11.5–15.5)
WBC: 7.2 10*3/uL (ref 4.0–10.5)
nRBC: 0 % (ref 0.0–0.2)

## 2022-07-14 NOTE — Discharge Summary (Signed)
Physician Discharge Summary  Jordan [name] FVC:[id number] DOB: September 09, 1951 DOA: 07/11/2022  PCP: [name], NP  Admit date: 07/11/2022 Discharge date: 07/14/2022  Admitted From: home Disposition:  home  Recommendations for Outpatient Follow-up:  Follow up with PCP in 1-2 weeks  Home Health: none Equipment/Devices: none  Discharge Condition: stable CODE STATUS: Full code Diet Orders (From admission, onward)     Start     Ordered   07/13/22 1850  DIET SOFT Room service appropriate? Yes; Fluid consistency: Thin  Diet effective now       Question Answer Comment  Room service appropriate? Yes   Fluid consistency: Thin      07/13/22 1852            HPI: Per admitting MD, Jordan [name] is a 71 y.o. 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 Patient reports 1 day of frequent nausea, vomiting, and epigastric pain.  She reports chronic watery stools with increased frequency over the last 24 hours.  She has not been able to maintain any adequate oral intake.  She reports similar episodes in the past due to adhesive small bowel obstruction twice.  Hospital Course / Discharge diagnoses: Principal Problem:   SBO (small bowel obstruction) (HCC) Active Problems:   Diabetes type 2, controlled ([place])   AKI (acute kidney injury) ([place])   Essential hypertension   Depression   Chronic pain syndrome   Principal problem Small bowel obstruction-patient was admitted to the hospital with GI symptoms, diagnosed with a small bowel obstruction on CT scan.  She had an NG tube placed, n.p.o., IV fluids and was managed conservatively.  General surgery consulted and followed patient while hospitalized.  Her symptoms have improved, NG tube was removed and her diet was slowly advanced.  She is now tolerating a regular diet and will be discharged home in stable  condition.  Active problems DM2-diet controlled, A1c 6.9 Essential hypertension-continue home regimen Rotavirus enteritis-diarrhea prior to admission, continue supportive treatment.  Diarrhea is improving Acute kidney injury-secondary to volume depletion from poor p.o. intake and GI losses.  Renal function normalized with fluids Depression-continue home medications  Sepsis ruled out   Discharge Instructions   Allergies as of 07/14/2022   No Known Allergies      Medication List     TAKE these medications    allopurinol 100 MG tablet Commonly known as: ZYLOPRIM Take 2 tablets (200 mg total) by mouth daily.   aspirin EC 81 MG tablet Take 81 mg by mouth daily.   Cholecalciferol 25 MCG (1000 UT) capsule Take 1,000 Units by mouth daily.   diclofenac 75 MG EC tablet Commonly known as: VOLTAREN Take 1 tablet by mouth 2 times daily   DULoxetine 60 MG capsule Commonly known as: CYMBALTA Take 1 capsule (60 mg total) by mouth daily.   furosemide 40 MG tablet Commonly known as: LASIX TAKE 1 TABLET BY MOUTH ONCE DAILY AS NEEDED What changed:  how much to take reasons to take this   multivitamin with minerals Tabs tablet Take 1 tablet by mouth daily.   pantoprazole 40 MG tablet Commonly known as: PROTONIX TAKE 1 TABLET BY MOUTH ONCE DAILY What changed: how much to take   triamterene-hydrochlorothiazide 37.5-25 MG capsule Commonly known as: Dyazide Take 1 capsule by mouth daily.       Consultations: General surgery   Procedures/Studies:  DG Abd  Portable 1V-Small Bowel Obstruction Protocol-initial, 8 hr delay  Result Date: 07/12/2022 CLINICAL DATA:  8 hour delay small bowel obstruction. EXAM: PORTABLE ABDOMEN - 1 VIEW COMPARISON:  July 11, 2022 FINDINGS: There is stable nasogastric tube positioning. Multiple dilated loops of air-filled small bowel are again seen. Radiopaque contrast is noted within the descending and sigmoid colon. No radio-opaque calculi or  other significant radiographic abnormality are seen. 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 IMPRESSION: 1. Persistently dilated air-filled small bowel consistent with a small bowel obstruction. 2. Radiopaque contrast is noted within the descending and sigmoid colon. Electronically Signed   By: Virgina Norfolk M.D.   On: 07/12/2022 21:35   DG Abdomen 1 View  Result Date: 07/11/2022 CLINICAL DATA:  NG tube placement EXAM: ABDOMEN - 1 VIEW COMPARISON:  Previous studies including the CT done earlier today FINDINGS: There is placement of NG tube with its tip in region of body of stomach. There is dilation of small-bowel loops suggesting high-grade small bowel obstruction. IMPRESSION: Tip of NG tube is seen in the stomach. Dilation of small-bowel loops suggests small bowel obstruction. Electronically Signed   By: Elmer Picker M.D.   On: 07/11/2022 12:21   CT L-SPINE NO CHARGE  Result Date: 07/11/2022 CLINICAL DATA:  Patient reports recent spinal injections (3 days ago) with subsequent malaise, nausea and vomiting. EXAM: CT LUMBAR SPINE WITHOUT CONTRAST TECHNIQUE: Multiplanar CT image reconstructions of the lumbar spine were generated from concurrent abdominopelvic CT dictated separately. RADIATION DOSE REDUCTION: This exam was performed according to the departmental dose-optimization program which includes automated exposure control, adjustment of the mA and/or kV according to patient size and/or use of iterative reconstruction technique. COMPARISON:  MRI lumbar spine 11/24/2021. FINDINGS: Segmentation: There are 5 lumbar type vertebral bodies. Alignment: Stable grade 1 degenerative anterolisthesis at L4-5. There is a mild convex left scoliosis. Vertebrae: No evidence of acute fracture or pars defect. There is multilevel spondylosis with advanced facet disease inferiorly manifesting as osteophytes, subchondral sclerosis and vacuum phenomenon. Sacroiliac degenerative  changes are present bilaterally. Paraspinal and other soft tissues: Intra-abdominal findings are dictated separately. No acute paraspinal findings are seen. Disc levels: T12-L1: Degenerative disc disease with disc bulging, vacuum phenomenon and endplate osteophytes. Stable left foraminal narrowing. L1-2: Mild disc bulging and endplate osteophyte formation. Mild facet and ligamentous hypertrophy. Stable mild to moderate spinal stenosis with mild lateral recess and foraminal narrowing bilaterally. L2-3: Chronic disc degeneration with annular disc bulging and endplate osteophytes asymmetric to the right. Vacuum phenomena. Moderate facet and ligamentous hypertrophy. Moderate multifactorial spinal stenosis with asymmetric right lateral recess and right foraminal narrowing. L3-4: Stable annular disc bulging with moderate facet and ligamentous hypertrophy. Mild to moderate spinal stenosis with asymmetric right lateral recess and right foraminal narrowing. L4-5: Loss of disc height with annular disc bulging. Severe bilateral facet hypertrophy accounting for the grade 1 anterolisthesis. Resulting severe multifactorial spinal stenosis with moderate to severe lateral recess and foraminal narrowing bilaterally. Probable chronic bilateral L4 nerve root encroachment. L5-S1: Mild disc bulging. Severe bilateral facet hypertrophy. No significant spinal stenosis. IMPRESSION: 1. No acute lumbar spine findings are identified. Specifically, no abnormal paraspinal or epidural fluid collections are seen after reported recent spinal injection. 2. Severe multi level lumbar spondylosis again noted, similar to previous MRI. There is spinal stenosis, lateral recess and foraminal narrowing at multiple levels, most advanced at L4-5. 3. No acute osseous findings. 4. Abdominopelvic CT findings dictated separately. Electronically Signed   By:  Richardean Sale M.D.   On: 07/11/2022 11:27   CT ABDOMEN PELVIS W CONTRAST  Result Date:  07/11/2022 CLINICAL DATA:  Abdominal pain, nausea, vomiting EXAM: CT ABDOMEN AND PELVIS WITH CONTRAST TECHNIQUE: Multidetector CT imaging of the abdomen and pelvis was performed using the standard protocol following bolus administration of intravenous contrast. RADIATION DOSE REDUCTION: This exam was performed according to the departmental dose-optimization program which includes automated exposure control, adjustment of the mA and/or kV according to patient size and/or use of iterative reconstruction technique. CONTRAST:  96m OMNIPAQUE IOHEXOL 300 MG/ML  SOLN COMPARISON:  12/01/2018 FINDINGS: Lower chest: There are faint ground-glass densities in the lower lung fields suggesting scarring. There is no focal consolidation in the visualized lower lung fields. There is no pleural effusion. Hepatobiliary: No focal abnormalities are seen in liver. There is no significant dilation of bile ducts. Gallbladder is not seen. Pancreas: No focal abnormalities are seen. Spleen: Unremarkable. Adrenals/Urinary Tract: Mild hyperplasia of the adrenals has not changed. There is no hydronephrosis. Left kidney is smaller than right. There is no hydronephrosis. There are no renal or ureteral stones. Beam hardening artifacts limit evaluation of the bladder. Bladder is not distended. There is a possible 12 mm cyst in the midportion of right kidney. Stomach/Bowel: Small hiatal hernia is seen. Stomach is moderately distended. There is marked dilation of proximal small bowel loops measuring up to 5 cm in diameter. Distal small bowel lobes are decompressed. Zone of transition appears to be in lower mid abdomen slightly below the level of umbilicus. Colon is not distended. Scattered diverticula are seen in the colon without signs of focal acute diverticulitis. Appendix is not distinctly visualized. Vascular/Lymphatic: Scattered arterial calcifications are seen. Reproductive: Uterus is not seen. Other: There is no ascites or pneumoperitoneum.  Musculoskeletal: There is mild anterolisthesis at L4-L5 level. Degenerative changes are noted in lumbar spine with spinal stenosis and encroachment of neural foramina at multiple levels, more so at L4-L5 level. There is previous left hip arthroplasty. IMPRESSION: There is marked dilation of proximal small bowel loops suggesting high-grade partial small bowel obstruction. Zone of transition is noted in lower mid abdomen slightly below the level of umbilicus. Findings most likely suggest obstruction caused by adhesions. There is no pneumoperitoneum. There is no hydronephrosis. Diverticulosis of colon without signs of focal diverticulitis. Other findings as described in the body of the report. Electronically Signed   By: PElmer PickerM.D.   On: 07/11/2022 11:14     Subjective: - no chest pain, shortness of breath, no abdominal pain, nausea or vomiting.   Discharge Exam: BP (!) 152/75 (BP Location: Right Arm)   Pulse 65   Temp 98 F (36.7 C) (Oral)   Resp 19   Ht '5\' 4"'$  (1.626 m)   Wt 114.2 kg   SpO2 96%   BMI 43.22 kg/m   General: Pt is alert, awake, not in acute distress Cardiovascular: RRR, S1/S2 +, no rubs, no gallops Respiratory: CTA bilaterally, no wheezing, no rhonchi Abdominal: Soft, NT, ND, bowel sounds + Extremities: no edema, no cyanosis    The results of significant diagnostics from this hospitalization (including imaging, microbiology, ancillary and laboratory) are listed below for reference.     Microbiology: Recent Results (from the past 240 hour(s))  Gastrointestinal Panel by PCR , Stool     Status: Abnormal   Collection Time: 07/12/22 10:29 AM   Specimen: Stool  Result Value Ref Range Status   Campylobacter species NOT DETECTED NOT DETECTED Final  Plesimonas shigelloides NOT DETECTED NOT DETECTED Final   Salmonella species NOT DETECTED NOT DETECTED Final   Yersinia enterocolitica NOT DETECTED NOT DETECTED Final   Vibrio species NOT DETECTED NOT DETECTED  Final   Vibrio cholerae NOT DETECTED NOT DETECTED Final   Enteroaggregative E coli (EAEC) NOT DETECTED NOT DETECTED Final   Enteropathogenic E coli (EPEC) NOT DETECTED NOT DETECTED Final   Enterotoxigenic E coli (ETEC) NOT DETECTED NOT DETECTED Final   Shiga like toxin producing E coli (STEC) NOT DETECTED NOT DETECTED Final   Shigella/Enteroinvasive E coli (EIEC) NOT DETECTED NOT DETECTED Final   Cryptosporidium NOT DETECTED NOT DETECTED Final   Cyclospora cayetanensis NOT DETECTED NOT DETECTED Final   Entamoeba histolytica NOT DETECTED NOT DETECTED Final   Giardia lamblia NOT DETECTED NOT DETECTED Final   Adenovirus F40/41 NOT DETECTED NOT DETECTED Final   Astrovirus NOT DETECTED NOT DETECTED Final   Norovirus GI/GII NOT DETECTED NOT DETECTED Final   Rotavirus A DETECTED (A) NOT DETECTED Final   Sapovirus (I, II, IV, and V) NOT DETECTED NOT DETECTED Final    Comment: Performed at [facility], [address] [place], [place] [address]     Labs: Basic Metabolic Panel: Recent Labs  Lab 07/11/22 0930 07/12/22 0457 07/13/22 0856 07/14/22 0537  NA 140 144 146* 141  K 4.0 4.3 3.5 3.6  CL 102 109 114* 110  CO2 23 22 21* 23  GLUCOSE 210* 130* 102* 114*  BUN 46* 43* 36* 28*  CREATININE 1.73* 1.18* 0.94 0.76  CALCIUM 10.1 9.2 9.0 8.8*  MG  --  2.2  --   --    Liver Function Tests: Recent Labs  Lab 07/11/22 0930 07/13/22 0856  AST 31 34  ALT 32 40  ALKPHOS 120 84  BILITOT 0.7 0.7  PROT 9.0* 6.6  ALBUMIN 5.0 3.8   CBC: Recent Labs  Lab 07/11/22 0930 07/12/22 0457 07/13/22 0856 07/14/22 0537  WBC 11.6* 8.2 6.8 7.2  NEUTROABS 9.8*  --   --   --   HGB 16.9* 16.0* 15.1* 13.7  HCT 50.4* 47.8* 46.9* 40.9  MCV 92.1 92.6 97.5 93.6  PLT 327 218 204 191   CBG: Recent Labs  Lab 07/13/22 0821 07/13/22 1148 07/13/22 1656 07/13/22 2133 07/14/22 0739  GLUCAP 89 111* 88 139* 104*   Hgb A1c No results for input(s): "HGBA1C" in the last 72 hours. Lipid  Profile No results for input(s): "CHOL", "HDL", "LDLCALC", "TRIG", "CHOLHDL", "LDLDIRECT" in the last 72 hours. Thyroid function studies No results for input(s): "TSH", "T4TOTAL", "T3FREE", "THYROIDAB" in the last 72 hours.  Invalid input(s): "FREET3" Urinalysis    Component Value Date/Time   COLORURINE YELLOW 10/01/2020 [place] 10/01/2020 1737   LABSPEC 1.025 10/01/2020 1737   PHURINE 5.5 10/01/2020 1737   GLUCOSEU NEGATIVE 10/01/2020 1737   GLUCOSEU NEGATIVE 08/11/2015 1023   HGBUR TRACE (A) 10/01/2020 1737   BILIRUBINUR negative 10/01/2021 1133   KETONESUR NEGATIVE 10/01/2020 1737   PROTEINUR Positive (A) 10/01/2021 1133   PROTEINUR 30 (A) 10/01/2020 1737   UROBILINOGEN 0.2 10/01/2021 1133   UROBILINOGEN 0.2 08/11/2015 1023   NITRITE Negative 10/01/2021 1133   NITRITE NEGATIVE 10/01/2020 1737   LEUKOCYTESUR Trace (A) 10/01/2021 1133   LEUKOCYTESUR NEGATIVE 10/01/2020 1737    FURTHER DISCHARGE INSTRUCTIONS:   Get Medicines reviewed and adjusted: Please take all your medications with you for your next visit with your Primary MD   Laboratory/radiological data: Please request your Primary  MD to go over all hospital tests and procedure/radiological results at the follow up, please ask your Primary MD to get all Hospital records sent to his/her office.   In some cases, they will be blood work, cultures and biopsy results pending at the time of your discharge. Please request that your primary care M.D. goes through all the records of your hospital data and follows up on these results.   Also Note the following: If you experience worsening of your admission symptoms, develop shortness of breath, life threatening emergency, suicidal or homicidal thoughts you must seek medical attention immediately by calling 911 or calling your MD immediately  if symptoms less severe.   You must read complete instructions/literature along with all the possible adverse  reactions/side effects for all the Medicines you take and that have been prescribed to you. Take any new Medicines after you have completely understood and accpet all the possible adverse reactions/side effects.    Do not drive when taking Pain medications or sleeping medications (Benzodaizepines)   Do not take more than prescribed Pain, Sleep and Anxiety Medications. It is not advisable to combine anxiety,sleep and pain medications without talking with your primary care practitioner   Special Instructions: If you have smoked or chewed Tobacco  in the last 2 yrs please stop smoking, stop any regular Alcohol  and or any Recreational drug use.   Wear Seat belts while driving.   Please note: You were cared for by a hospitalist during your hospital stay. Once you are discharged, your primary care physician will handle any further medical issues. Please note that NO REFILLS for any discharge medications will be authorized once you are discharged, as it is imperative that you return to your primary care physician (or establish a relationship with a primary care physician if you do not have one) for your post hospital discharge needs so that they can reassess your need for medications and monitor your lab values.  Time coordinating discharge: 25 minutes  SIGNED:  Marzetta Board, MD, PhD 07/14/2022, 9:36 AM

## 2022-07-14 NOTE — Progress Notes (Signed)
   Subjective/Chief Complaint: Tolerating diet - advancing to soft diet for breakfast No abdominal pain Still with some diarrhea - less watery   Objective: Vital signs in last 24 hours: Temp:  [98 F (36.7 C)-98.2 F (36.8 C)] 98 F (36.7 C) (07/23 0335) Pulse Rate:  [54-65] 65 (07/23 0335) Resp:  [19-20] 19 (07/23 0335) BP: (142-186)/(73-86) 152/75 (07/23 0335) SpO2:  [96 %-100 %] 96 % (07/23 0335) Weight:  [867.6 kg] 114.2 kg (07/23 0500) Last BM Date : 07/13/22 (per pt report)  Intake/Output from previous day: 07/22 0701 - 07/23 0700 In: 1920 [P.O.:1920] Out: 300 [Emesis/NG output:300] Intake/Output this shift: No intake/output data recorded.  WDWN in NAD Abd - soft, non-tender  Lab Results:  Recent Labs    07/13/22 0856 07/14/22 0537  WBC 6.8 7.2  HGB 15.1* 13.7  HCT 46.9* 40.9  PLT 204 191   BMET Recent Labs    07/13/22 0856 07/14/22 0537  NA 146* 141  K 3.5 3.6  CL 114* 110  CO2 21* 23  GLUCOSE 102* 114*  BUN 36* 28*  CREATININE 0.94 0.76  CALCIUM 9.0 8.8*   PT/INR No results for input(s): "LABPROT", "INR" in the last 72 hours. ABG No results for input(s): "PHART", "HCO3" in the last 72 hours.  Invalid input(s): "PCO2", "PO2"  Studies/Results: DG Abd Portable 1V-Small Bowel Obstruction Protocol-initial, 8 hr delay  Result Date: 07/12/2022 CLINICAL DATA:  8 hour delay small bowel obstruction. EXAM: PORTABLE ABDOMEN - 1 VIEW COMPARISON:  July 11, 2022 FINDINGS: There is stable nasogastric tube positioning. Multiple dilated loops of air-filled small bowel are again seen. Radiopaque contrast is noted within the descending and sigmoid colon. No radio-opaque calculi or other significant radiographic abnormality are seen. 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 IMPRESSION: 1. Persistently dilated air-filled small bowel consistent with a small bowel obstruction. 2. Radiopaque contrast is noted within the descending  and sigmoid colon. Electronically Signed   By: Virgina Norfolk M.D.   On: 07/12/2022 21:35    Anti-infectives: Anti-infectives (From admission, onward)    Start     Dose/Rate Route Frequency Ordered Stop   07/11/22 1030  cefTRIAXone (ROCEPHIN) 2 g in sodium chloride 0.9 % 100 mL IVPB        2 g 200 mL/hr over 30 Minutes Intravenous  Once 07/11/22 1029 07/11/22 1128       Assessment/Plan: Viral enteritis - Rotavirus Bowel function improving No tenderness No surgical indications  Discharge per primary team - we will sign off  LOS: 3 days    Jordan [name] 07/14/2022

## 2022-07-17 ENCOUNTER — Other Ambulatory Visit: Payer: Self-pay | Admitting: *Deleted

## 2022-07-17 NOTE — Patient Outreach (Signed)
  Care Coordination [facility] Note Transition Care Management Follow-up Telephone Call Date of discharge and from where: [phone] [facility] How have you been since you were released from the hospital? Doing fine Any questions or concerns? No  Items Reviewed: Did the pt receive and understand the discharge instructions provided? Yes  Medications obtained and verified? Yes  Other? N  Any new allergies since your discharge? No  Dietary orders reviewed? No Do you have support at home? Yes   Home Care and Equipment/Supplies: Were home health services ordered? not applicable If so, what is the name of the agency? N   Has the agency set up a time to come to the patient's home? not applicable Were any new equipment or medical supplies ordered?  No What is the name of the medical supply agency?   Were you able to get the supplies/equipment? not applicable Do you have any questions related to the use of the equipment or supplies? No  Functional Questionnaire: (I = Independent and D = Dependent) ADLs: I  Bathing/Dressing- I  Meal Prep- I  Eating- I  Maintaining continence- I  Transferring/Ambulation- I  Managing Meds- I  Follow up appointments reviewed:  PCP Hospital f/u appt confirmed? Yes  Scheduled to see [name] at 10:20 am on July 22, 2022 [facility] f/u appt confirmed? No  . Are transportation arrangements needed? No  If their condition worsens, is the pt aware to call PCP or go to the Emergency Dept.? Yes Was the patient provided with contact information for the PCP's office or ED? Yes Was to pt encouraged to call back with questions or concerns? Yes  SDOH assessments and interventions completed:   Yes  Care Coordination Interventions Activated:  No Care Coordination Interventions:   N/A  Encounter Outcome:  Pt. Visit Completed

## 2022-07-17 NOTE — Patient Outreach (Signed)
  Care Coordination [facility] Note Transition Care Management Unsuccessful Follow-up Telephone Call  Date of discharge and from where:  [phone] Jordan [name]  Attempts:  1st Attempt  Reason for unsuccessful TCM follow-up call:  Left voice message  [facility] Management [phone]

## 2022-07-18 ENCOUNTER — Ambulatory Visit: Payer: PPO

## 2022-07-22 ENCOUNTER — Ambulatory Visit: Payer: PPO | Admitting: Family

## 2022-07-23 ENCOUNTER — Ambulatory Visit: Payer: PPO | Admitting: Family

## 2022-07-23 ENCOUNTER — Ambulatory Visit (INDEPENDENT_AMBULATORY_CARE_PROVIDER_SITE_OTHER): Payer: PPO | Admitting: Family

## 2022-07-23 VITALS — BP 154/78 | HR 81 | Temp 98.2°F | Resp 16 | Wt 248.0 lb

## 2022-07-23 DIAGNOSIS — K56609 Unspecified intestinal obstruction, unspecified as to partial versus complete obstruction: Secondary | ICD-10-CM | POA: Diagnosis not present

## 2022-07-23 DIAGNOSIS — M5416 Radiculopathy, lumbar region: Secondary | ICD-10-CM

## 2022-07-23 DIAGNOSIS — E119 Type 2 diabetes mellitus without complications: Secondary | ICD-10-CM | POA: Diagnosis not present

## 2022-07-23 LAB — BASIC METABOLIC PANEL
BUN: 22 mg/dL (ref 6–23)
CO2: 23 mEq/L (ref 19–32)
Calcium: 9.6 mg/dL (ref 8.4–10.5)
Chloride: 101 mEq/L (ref 96–112)
Creatinine, Ser: 1.36 mg/dL — ABNORMAL HIGH (ref 0.40–1.20)
GFR: 39.1 mL/min — ABNORMAL LOW (ref >60.00)
Glucose, Bld: 102 mg/dL — ABNORMAL HIGH (ref 70–99)
Potassium: 3.6 mEq/L (ref 3.5–5.1)
Sodium: 137 mEq/L (ref 135–145)

## 2022-07-23 LAB — HEMOGLOBIN A1C: Hgb A1c MFr Bld: 6.5 % (ref 4.6–6.5)

## 2022-07-23 NOTE — Assessment & Plan Note (Signed)
Clinically stable on DM diet. Obtain follow up A1C.

## 2022-07-23 NOTE — Progress Notes (Signed)
Subjective:   By signing my name below, I, [name], attest that this documentation has been prepared under the direction and in the presence of Karie Chimera, NP 07/23/2022   Patient ID: Jordan [name], female    DOB: 25-Aug-1951, 71 y.o.   MRN: [id number]  Chief Complaint  Patient presents with   Follow-up    Here for hospital follow up     HPI Patient is in today for a hospital follow-up  ED Admission: She was admitted to the ED on 07/11/2022 for nausea, vomiting and diarrhea. She was found to have small bowel obstruction and was admitted to the hospital. NGT was placed and her SBO ultimately resolved. She was later discharged on 06/14/2022. She is currently eating and drinking regularly but has not felt ready to add meat in the past week. During her time in admissions, her blood sugars fluctuated. Also, her weight is decreasing which she is happy about.  Wt Readings from Last 3 Encounters:  07/23/22 248 lb (112.5 kg)  07/14/22 251 lb 12.3 oz (114.2 kg)  03/20/22 267 lb (121.1 kg)    Lab Results  Component Value Date   HGBA1C 6.9 (H) 03/08/2022   Wt Readings from Last 3 Encounters:  07/23/22 248 lb (112.5 kg)  07/14/22 251 lb 12.3 oz (114.2 kg)  03/20/22 267 lb (121.1 kg)    Health Maintenance Due  Topic Date Due   OPHTHALMOLOGY EXAM  08/23/2016   COVID-19 Vaccine (3 - Pfizer risk series) 09/13/2020   INFLUENZA VACCINE  07/23/2022    Past Medical History:  Diagnosis Date   Acute hypoxemic respiratory failure due to COVID-19 ([place]) 10/08/2020   Cellulitis and abscess of finger, unspecified 401.9   Cellulitis and abscess of foot, except toes    Depressive disorder, not elsewhere classified    GERD (gastroesophageal reflux disease)    Gout, unspecified    History of tobacco abuse 12/01/2018   Hyperglycemia 05/08/2011   Hyperlipidemia    borderline- not on meds   Hypertension    Insomnia    Leg edema    chronic   Obesity, unspecified     Osteoarthritis    Renal insufficiency, mild 05/08/2011   SBO (small bowel obstruction) ([place]) 12/01/2018   Small bowel obstruction ([place])     Past Surgical History:  Procedure Laterality Date   ABDOMINAL HYSTERECTOMY  2002   infection- no history of cancer   APPENDECTOMY  2002   catacact Right 11/01/2019   CHOLECYSTECTOMY  1977   ECTOPIC PREGNANCY SURGERY  1971   right shoulder rotator cuff repair  06-07-10   TOTAL HIP ARTHROPLASTY Left 1998    Family History  Problem Relation Age of Onset   Diabetes Mother    Cancer Mother        colon/ pancreatic   Colon cancer Mother 52   Hypertension Father    Alcohol abuse Father    Cirrhosis Father    Other Sister        Pyeoderma gangrenosis   Diabetes Sister        type 2   Kidney failure Sister    Liver disease Sister    Cirrhosis Sister        had NASH, died heart failure   Heart attack Brother    Stomach cancer Maternal Grandfather    Obesity Daughter    Rectal cancer Neg Hx    Esophageal cancer Neg Hx     Social History   Socioeconomic History  Marital status: Divorced    Spouse name: Not on file   Number of children: 2   Years of education: Not on file   Highest education level: Not on file  Occupational History   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 3 grandchildren, one great grandchild   Works at ED front desk   Divorced (married x 30 years)   Social Determinants of Health   Financial Resource Strain: Low Risk  (07/16/2021)    Overall Financial Resource Strain (CARDIA)    Difficulty of Paying Living Expenses: Not hard at all  Food Insecurity: No Food Insecurity (07/16/2021)   Hunger Vital Sign    Worried About Running Out of Food in the Last Year: Never true    Ran Out of Food in the Last Year: Never true  Transportation Needs: No Transportation Needs (07/16/2021)   PRAPARE - Hydrologist (Medical): No    Lack of Transportation (Non-Medical): No  Physical Activity: Insufficiently Active (07/16/2021)   Exercise Vital Sign    Days of Exercise per Week: 3 days    Minutes of Exercise per Session: 30 min  Stress: No Stress Concern Present (07/16/2021)   [place]    Feeling of Stress : Not at all  Social Connections: Moderately Isolated (07/16/2021)   Social Connection and Isolation Panel [NHANES]    Frequency of Communication with Friends and Family: More than three times a week    Frequency of Social Gatherings with Friends and Family: More than three times a week    Attends Religious Services: More than 4 times per year    Active Member of [employer] or Organizations: No    Attends Archivist Meetings: Never    Marital Status: Divorced  Human resources officer Violence: Not At Risk (07/16/2021)   Humiliation, Afraid, Rape, and Kick questionnaire    Fear of Current or Ex-Partner: No    Emotionally Abused: No    Physically Abused: No    Sexually Abused: No    Outpatient Medications Prior to Visit  Medication Sig Dispense Refill   allopurinol (ZYLOPRIM) 100 MG tablet Take 2 tablets (200 mg total) by mouth daily. 180 tablet 0   aspirin EC 81 MG tablet Take 81 mg by mouth daily.     diclofenac (VOLTAREN) 75 MG EC tablet Take 1 tablet by mouth 2 times daily 60 tablet 2   DULoxetine (CYMBALTA) 60 MG capsule Take 1 capsule (60 mg total) by mouth daily. 90 capsule 0   furosemide (LASIX) 40 MG tablet TAKE 1 TABLET BY MOUTH  ONCE DAILY AS NEEDED (Patient taking differently: Take 40 mg by mouth daily as needed for edema.) 90 tablet 1   Multiple Vitamin (MULTIVITAMIN WITH MINERALS) TABS tablet Take 1 tablet by mouth daily.     pantoprazole (PROTONIX) 40 MG  tablet TAKE 1 TABLET BY MOUTH ONCE DAILY (Patient taking differently: Take 40 mg by mouth daily.) 90 tablet 1   triamterene-hydrochlorothiazide (DYAZIDE) 37.5-25 MG capsule Take 1 capsule by mouth daily. 90 capsule 1   Cholecalciferol 25 MCG (1000 UT) capsule Take 1,000 Units by mouth daily.     No facility-administered medications prior to visit.    No Known Allergies  ROS See HPI    Objective:    Physical Exam Constitutional:      General: She is not in acute distress.    Appearance: Normal appearance. She is not ill-appearing.  HENT:     Head: Normocephalic and atraumatic.     Right Ear: External ear normal.     Left Ear: External ear normal.  Eyes:     Extraocular Movements: Extraocular movements intact.     Pupils: Pupils are equal, round, and reactive to light.  Cardiovascular:     Rate and Rhythm: Normal rate and regular rhythm.     Heart sounds: Normal heart sounds. No murmur heard.    No gallop.  Pulmonary:     Effort: Pulmonary effort is normal. No respiratory distress.     Breath sounds: Normal breath sounds. No wheezing or rales.  Skin:    General: Skin is warm and dry.  Neurological:     Mental Status: She is alert and oriented to person, place, and time.  Psychiatric:        Mood and Affect: Mood normal.        Behavior: Behavior normal.        Judgment: Judgment normal.     BP (!) 154/78 (BP Location: Right Arm, Patient Position: Sitting, Cuff Size: Small)   Pulse 81   Temp 98.2 F (36.8 C) (Oral)   Resp 16   Wt 248 lb (112.5 kg)   SpO2 97%   BMI 42.57 kg/m  Wt Readings from Last 3 Encounters:  07/23/22 248 lb (112.5 kg)  07/14/22 251 lb 12.3 oz (114.2 kg)  03/20/22 267 lb (121.1 kg)       Assessment & Plan:    Problem List Items Addressed This Visit       Unprioritized   SBO (small bowel obstruction) ([place])    Resolved.  Monitor.       Lumbar radiculopathy    Recently had her second ESI which she feels has been more helpful than last time.       Diabetes type 2, controlled ([place]) - Primary    Clinically stable on DM diet. Obtain follow up A1C.       Relevant Orders   Basic metabolic panel   Hemoglobin A1c    No orders of the defined types were placed in this encounter.   I, Nance Pear, NP, personally preformed the services described in this documentation.  All medical record entries made by the scribe were at my direction and in my presence.  I have reviewed the chart and discharge instructions (if applicable) and agree that the record reflects my personal performance and is accurate and complete. 07/23/2022   I,Amber Collins,acting as a scribe for Nance Pear, NP.,have documented all relevant documentation on the behalf of Nance Pear, NP,as directed by  Nance Pear, NP while in the presence of Nance Pear, NP.    Nance Pear, NP

## 2022-07-23 NOTE — Assessment & Plan Note (Signed)
Resolved. Monitor.  

## 2022-07-23 NOTE — Patient Instructions (Signed)
Please complete lab work prior to leaving.   

## 2022-07-23 NOTE — Assessment & Plan Note (Signed)
Recently had her second ESI which she feels has been more helpful than last time.

## 2022-07-24 ENCOUNTER — Telehealth: Payer: Self-pay | Admitting: Family

## 2022-07-24 DIAGNOSIS — N289 Disorder of kidney and ureter, unspecified: Secondary | ICD-10-CM

## 2022-07-24 NOTE — Telephone Encounter (Signed)
Kidney function has decreased. I suspect that this is due to the voltaren that she is taking for her back pain. D/c voltaren. OK to take tylenol. Repeat bmet in 2 weeks, dx renal insufficiency.

## 2022-07-24 NOTE — Telephone Encounter (Signed)
Called but no answer, lvm for patient to call back for results

## 2022-07-25 LAB — HM DIABETES EYE EXAM

## 2022-07-25 NOTE — Telephone Encounter (Signed)
Patient advised of results and provider's advise. She reports "she is going to continue to take voltaren because if helps with the back pain and she does not like tylenol. She is only taking Voltaren once a day instead of twice". Patient was told advise remains to discontinue medication. She verbalized understanding.  She was scheduled for BMET 08-12-22.

## 2022-07-25 NOTE — Addendum Note (Signed)
Addended by: Jiles Prows on: 07/25/2022 11:04 AM   Modules accepted: Orders

## 2022-07-26 ENCOUNTER — Telehealth: Payer: Self-pay | Admitting: Family

## 2022-07-26 ENCOUNTER — Other Ambulatory Visit (HOSPITAL_BASED_OUTPATIENT_CLINIC_OR_DEPARTMENT_OTHER): Payer: Self-pay

## 2022-07-26 NOTE — Telephone Encounter (Signed)
Eye exam repots put in provider's folder for review

## 2022-07-26 NOTE — Telephone Encounter (Signed)
Pt send copy of her eye exam to be added to her chart in a small white envelope. Pt would like provider to see and have it on pt's chart. Document put at front office tray under providers name.

## 2022-08-12 ENCOUNTER — Other Ambulatory Visit (INDEPENDENT_AMBULATORY_CARE_PROVIDER_SITE_OTHER): Payer: PPO

## 2022-08-12 DIAGNOSIS — N289 Disorder of kidney and ureter, unspecified: Secondary | ICD-10-CM | POA: Diagnosis not present

## 2022-08-12 LAB — BASIC METABOLIC PANEL
BUN: 26 mg/dL — ABNORMAL HIGH (ref 6–23)
CO2: 28 mEq/L (ref 19–32)
Calcium: 9.8 mg/dL (ref 8.4–10.5)
Chloride: 100 mEq/L (ref 96–112)
Creatinine, Ser: 1.07 mg/dL (ref 0.40–1.20)
GFR: 52.12 mL/min — ABNORMAL LOW (ref >60.00)
Glucose, Bld: 113 mg/dL — ABNORMAL HIGH (ref 70–99)
Potassium: 3.8 mEq/L (ref 3.5–5.1)
Sodium: 140 mEq/L (ref 135–145)

## 2022-08-19 ENCOUNTER — Other Ambulatory Visit (HOSPITAL_COMMUNITY): Payer: Self-pay

## 2022-08-19 ENCOUNTER — Other Ambulatory Visit: Payer: Self-pay | Admitting: Family

## 2022-08-19 ENCOUNTER — Other Ambulatory Visit (HOSPITAL_BASED_OUTPATIENT_CLINIC_OR_DEPARTMENT_OTHER): Payer: Self-pay

## 2022-08-19 MED ORDER — DULOXETINE HCL 60 MG PO CPEP
60.0000 mg | ORAL_CAPSULE | Freq: Every day | ORAL | 0 refills | Status: DC
  Filled 2022-08-19: qty 90, 90d supply, fill #0

## 2022-08-19 MED ORDER — ALLOPURINOL 100 MG PO TABS
200.0000 mg | ORAL_TABLET | Freq: Every day | ORAL | 0 refills | Status: DC
  Filled 2022-08-19: qty 180, 90d supply, fill #0

## 2022-08-21 ENCOUNTER — Other Ambulatory Visit (HOSPITAL_BASED_OUTPATIENT_CLINIC_OR_DEPARTMENT_OTHER): Payer: Self-pay

## 2022-08-30 ENCOUNTER — Ambulatory Visit (INDEPENDENT_AMBULATORY_CARE_PROVIDER_SITE_OTHER): Payer: PPO | Admitting: *Deleted

## 2022-08-30 DIAGNOSIS — Z0000 Encounter for general adult medical examination without abnormal findings: Secondary | ICD-10-CM | POA: Diagnosis not present

## 2022-08-30 NOTE — Patient Instructions (Signed)
Jordan [name] , Thank you for taking time to come for your Medicare Wellness Visit. I appreciate your ongoing commitment to your health goals. Please review the following plan we discussed and let me know if I can assist you in the future.   These are the goals we discussed:  Goals      Check blood pressure 2-3 times per week and/or when feeling dizzy     Chronic Care Management Pharmacy Care Plan     CARE PLAN ENTRY (see longitudinal plan of care for additional care plan information)  Current Barriers:  Chronic Disease Management support, education, and care coordination needs related to Tobacco Use, Pre-DM, HTN, HLD, Gerd, Gout, Depression, Osteoarthritis, Obesity   Hypertension BP Readings from Last 3 Encounters:  06/09/20 (!) 144/86  07/20/19 131/78  03/15/19 126/68  Pharmacist Clinical Goal(s): Over the next 180 days, patient will work with PharmD and providers to maintain BP goal <140/90 Current regimen:  Furosemide '40mg'$  PRN Patient self care activities - Over the next 180 days, patient will: Maintain hypertension medication regimen.   Hyperlipidemia Lab Results  Component Value Date/Time   LDLCALC 93 06/10/2018 10:43 AM   LDLDIRECT 111.0 07/20/2019 11:10 AM  Pharmacist Clinical Goal(s): Over the next 180 days, patient will work with PharmD and providers to achieve LDL goal < 100 Current regimen:  Diet and exercise management   Interventions: Discussed diet and exercise Patient self care activities - Over the next 180 days, patient will: Reduce cholesterol containing foods  Pre-Diabetes Lab Results  Component Value Date/Time   HGBA1C 6.2 07/20/2019 11:10 AM   HGBA1C 6.1 03/15/2019 09:27 AM  Pharmacist Clinical Goal(s): Over the next 180 days, patient will work with PharmD and providers to maintain A1c goal <6.5% Current regimen:  Diet and exercise management   Interventions: Discussed diet and exercise Patient self care activities - Over the next 180 days,  patient will: Maintain a1c <6.5%  Tobacco Use Disorder Pharmacist Clinical Goal(s) Over the next 180 days, patient will work with PharmD and providers to reduce the amount of cigarettes consumed Current regimen:  None Interventions: Discussed smoking cessation. Patient is in the pre-contemplation stage Patient self care activities - Over the next 180 days, patient will: Work to reduce the number of cigarettes consumed  Medication management Pharmacist Clinical Goal(s): Over the next 180 days, patient will work with PharmD and providers to maintain optimal medication adherence Current pharmacy: [facility] Interventions Comprehensive medication review performed. Continue current medication management strategy Patient self care activities - Over the next 180 days, patient will: Focus on medication adherence by filling and taking medications appropriately  Take medications as prescribed Report any questions or concerns to PharmD and/or provider(s)  Please see past updates related to this goal by clicking on the "Past Updates" button in the selected goal      Continue practicing moderation with carbohydrates     Discuss medication options for depression     Increase physical activity     Purchase blood pressure cuff        This is a list of the screening recommended for you and due dates:  Health Maintenance  Topic Date Due   COVID-19 Vaccine (3 - Pfizer risk series) 09/13/2020   Flu Shot  07/23/2022   Hemoglobin A1C  01/23/2023   Eye exam for diabetics  07/26/2023   Mammogram  07/31/2023   Tetanus Vaccine  08/10/2025   Colon Cancer Screening  09/21/2026   Pneumonia Vaccine  Completed   DEXA scan (bone density measurement)  Completed   Hepatitis C Screening: USPSTF Recommendation to screen - Ages 43-79 yo.  Completed   Zoster (Shingles) Vaccine  Completed   HPV Vaccine  Aged Out   Complete foot exam   Discontinued       Next appointment: Follow up  in one year for your annual wellness visit 09/02/23   Preventive Care 65 Years and Older, Female Preventive care refers to lifestyle choices and visits with your health care provider that can promote health and wellness. What does preventive care include? A yearly physical exam. This is also called an annual well check. Dental exams once or twice a year. Routine eye exams. Ask your health care provider how often you should have your eyes checked. Personal lifestyle choices, including: Daily care of your teeth and gums. Regular physical activity. Eating a healthy diet. Avoiding tobacco and drug use. Limiting alcohol use. Practicing safe sex. Taking low-dose aspirin every day. Taking vitamin and mineral supplements as recommended by your health care provider. What happens during an annual well check? The services and screenings done by your health care provider during your annual well check will depend on your age, overall health, lifestyle risk factors, and family history of disease. Counseling  Your health care provider may ask you questions about your: Alcohol use. Tobacco use. Drug use. Emotional well-being. Home and relationship well-being. Sexual activity. Eating habits. History of falls. Memory and ability to understand (cognition). Work and work Statistician. Reproductive health. Screening  You may have the following tests or measurements: Height, weight, and BMI. Blood pressure. Lipid and cholesterol levels. These may be checked every 5 years, or more frequently if you are over 2 years old. Skin check. Lung cancer screening. You may have this screening every year starting at age 57 if you have a 30-pack-year history of smoking and currently smoke or have quit within the past 15 years. Fecal occult blood test (FOBT) of the stool. You may have this test every year starting at age 69. Flexible sigmoidoscopy or colonoscopy. You may have a sigmoidoscopy every 5 years or a  colonoscopy every 10 years starting at age 30. Hepatitis C blood test. Hepatitis B blood test. Sexually transmitted disease (STD) testing. Diabetes screening. This is done by checking your blood sugar (glucose) after you have not eaten for a while (fasting). You may have this done every 1-3 years. Bone density scan. This is done to screen for osteoporosis. You may have this done starting at age 57. Mammogram. This may be done every 1-2 years. Talk to your health care provider about how often you should have regular mammograms. Talk with your health care provider about your test results, treatment options, and if necessary, the need for more tests. Vaccines  Your health care provider may recommend certain vaccines, such as: Influenza vaccine. This is recommended every year. Tetanus, diphtheria, and acellular pertussis (Tdap, Td) vaccine. You may need a Td booster every 10 years. Zoster vaccine. You may need this after age 83. Pneumococcal 13-valent conjugate (PCV13) vaccine. One dose is recommended after age 20. Pneumococcal polysaccharide (PPSV23) vaccine. One dose is recommended after age 40. Talk to your health care provider about which screenings and vaccines you need and how often you need them. This information is not intended to replace advice given to you by your health care provider. Make sure you discuss any questions you have with your health care provider. Document Released: 01/05/2016 Document Revised: 08/28/2016 Document  Reviewed: 10/10/2015 Elsevier Interactive Patient Education  2017 [place] Prevention in the Home Falls can cause injuries. They can happen to people of all ages. There are many things you can do to make your home safe and to help prevent falls. What can I do on the outside of my home? Regularly fix the edges of walkways and driveways and fix any cracks. Remove anything that might make you trip as you walk through a door, such as a raised step or  threshold. Trim any bushes or trees on the path to your home. Use bright outdoor lighting. Clear any walking paths of anything that might make someone trip, such as rocks or tools. Regularly check to see if handrails are loose or broken. Make sure that both sides of any steps have handrails. Any raised decks and porches should have guardrails on the edges. Have any leaves, snow, or ice cleared regularly. Use sand or salt on walking paths during winter. Clean up any spills in your garage right away. This includes oil or grease spills. What can I do in the bathroom? Use night lights. Install grab bars by the toilet and in the tub and shower. Do not use towel bars as grab bars. Use non-skid mats or decals in the tub or shower. If you need to sit down in the shower, use a plastic, non-slip stool. Keep the floor dry. Clean up any water that spills on the floor as soon as it happens. Remove soap buildup in the tub or shower regularly. Attach bath mats securely with double-sided non-slip rug tape. Do not have throw rugs and other things on the floor that can make you trip. What can I do in the bedroom? Use night lights. Make sure that you have a light by your bed that is easy to reach. Do not use any sheets or blankets that are too big for your bed. They should not hang down onto the floor. Have a firm chair that has side arms. You can use this for support while you get dressed. Do not have throw rugs and other things on the floor that can make you trip. What can I do in the kitchen? Clean up any spills right away. Avoid walking on wet floors. Keep items that you use a lot in easy-to-reach places. If you need to reach something above you, use a strong step stool that has a grab bar. Keep electrical cords out of the way. Do not use floor polish or wax that makes floors slippery. If you must use wax, use non-skid floor wax. Do not have throw rugs and other things on the floor that can make you  trip. What can I do with my stairs? Do not leave any items on the stairs. Make sure that there are handrails on both sides of the stairs and use them. Fix handrails that are broken or loose. Make sure that handrails are as long as the stairways. Check any carpeting to make sure that it is firmly attached to the stairs. Fix any carpet that is loose or worn. Avoid having throw rugs at the top or bottom of the stairs. If you do have throw rugs, attach them to the floor with carpet tape. Make sure that you have a light switch at the top of the stairs and the bottom of the stairs. If you do not have them, ask someone to add them for you. What else can I do to help prevent falls? Wear shoes that: Do  not have high heels. Have rubber bottoms. Are comfortable and fit you well. Are closed at the toe. Do not wear sandals. If you use a stepladder: Make sure that it is fully opened. Do not climb a closed stepladder. Make sure that both sides of the stepladder are locked into place. Ask someone to hold it for you, if possible. Clearly mark and make sure that you can see: Any grab bars or handrails. First and last steps. Where the edge of each step is. Use tools that help you move around (mobility aids) if they are needed. These include: Canes. Walkers. Scooters. Crutches. Turn on the lights when you go into a dark area. Replace any light bulbs as soon as they burn out. Set up your furniture so you have a clear path. Avoid moving your furniture around. If any of your floors are uneven, fix them. If there are any pets around you, be aware of where they are. Review your medicines with your doctor. Some medicines can make you feel dizzy. This can increase your chance of falling. Ask your doctor what other things that you can do to help prevent falls. This information is not intended to replace advice given to you by your health care provider. Make sure you discuss any questions you have with your  health care provider. Document Released: 10/05/2009 Document Revised: 05/16/2016 Document Reviewed: 01/13/2015 Elsevier Interactive Patient Education  2017 [employer].

## 2022-08-30 NOTE — Progress Notes (Signed)
Subjective:   Jordan [name] is a 71 y.o. female who presents for Medicare Annual (Subsequent) preventive examination.  I connected with  [name] on 08/30/22 by a audio enabled telemedicine application and verified that I am speaking with the correct person using two identifiers.  Patient Location: Home  Provider Location: Office/Clinic  I discussed the limitations of evaluation and management by telemedicine. The patient expressed understanding and agreed to proceed.   Review of Systems    Defer to PCP Cardiac Risk Factors include: advanced age (>18mn, >>62women);diabetes mellitus;dyslipidemia;hypertension     Objective:    There were no vitals filed for this visit. There is no height or weight on file to calculate BMI.     08/30/2022    1:06 PM 07/11/2022    8:51 PM 07/11/2022    5:11 PM 01/29/2022    8:05 AM 07/16/2021    8:27 AM 10/08/2020    7:00 PM 10/08/2020    6:14 PM  Advanced Directives  Does Patient Have a Medical Advance Directive? Yes Yes No Yes Yes  Yes  Type of [place]amed[place]of [place] Living will  [place] Living will   Does patient want to make changes to medical advance directive? No - Patient declined No - Patient declined  No - Patient declined  No - Patient declined   Copy of [place] Chart? Yes - validated most recent copy scanned in chart (See row information)   Yes - validated most recent copy scanned in chart (See row information) Yes - validated most recent copy scanned in chart (See row information)    Would patient like information on creating a medical advance directive?  No - Patient declined No - Patient declined   No - Patient declined     Current Medications (verified) Outpatient Encounter Medications as of 08/30/2022  Medication Sig   allopurinol (ZYLOPRIM) 100 MG tablet Take 2 tablets (200 mg total) by mouth  daily.   aspirin EC 81 MG tablet Take 81 mg by mouth daily.   diclofenac (VOLTAREN) 75 MG EC tablet Take 1 tablet by mouth 2 times daily   DULoxetine (CYMBALTA) 60 MG capsule Take 1 capsule (60 mg total) by mouth daily.   furosemide (LASIX) 40 MG tablet TAKE 1 TABLET BY MOUTH ONCE DAILY AS NEEDED (Patient taking differently: Take 40 mg by mouth daily as needed for edema.)   Multiple Vitamin (MULTIVITAMIN WITH MINERALS) TABS tablet Take 1 tablet by mouth daily.   pantoprazole (PROTONIX) 40 MG tablet TAKE 1 TABLET BY MOUTH ONCE DAILY (Patient taking differently: Take 40 mg by mouth daily.)   triamterene-hydrochlorothiazide (DYAZIDE) 37.5-25 MG capsule Take 1 capsule by mouth daily.   [DISCONTINUED] famotidine (PEPCID AC) 10 MG chewable tablet Chew 10 mg by mouth daily.   No facility-administered encounter medications on file as of 08/30/2022.    Allergies (verified) Patient has no known allergies.   History: Past Medical History:  Diagnosis Date   [place]emic respiratory [place]   Cellulitis and abscess of finger, unspecified 401.9   Cellulitis and abscess of foot, except toes    Depressive disorder, not elsewhere classified    GERD (gastroesophageal reflux disease)    Gout, unspecified    History of tobacco abuse 12/01/2018   Hyperglycemia 05/08/2011   Hyperlipidemia    borderline- not on meds   Hypertension  Insomnia    Leg edema    chronic   Obesity, unspecified    Osteoarthritis    Renal insufficiency, mild 05/08/2011   SBO (small bowel obstruction) ([place]) 12/01/2018   Small bowel obstruction ([place])    Past Surgical History:  Procedure Laterality Date   ABDOMINAL HYSTERECTOMY  2002   infection- no history of cancer   APPENDECTOMY  2002   catacact Right 11/01/2019   CHOLECYSTECTOMY  1977   ECTOPIC PREGNANCY SURGERY  1971   right shoulder rotator cuff repair  06-07-10   TOTAL HIP ARTHROPLASTY Left 1998   Family History  Problem  Relation Age of Onset   Diabetes Mother    Cancer Mother        colon/ pancreatic   Colon cancer Mother 59   Hypertension Father    Alcohol abuse Father    Cirrhosis Father    Other Sister        Pyeoderma gangrenosis   Diabetes Sister        type 2   Kidney failure Sister    Liver disease Sister    Cirrhosis Sister        had NASH, died heart failure   Heart attack Brother    Stomach cancer Maternal Grandfather    Obesity Daughter    Rectal cancer Neg Hx    Esophageal cancer Neg Hx    Social History   Socioeconomic History   Marital status: Divorced    Spouse name: Not on file   Number of children: 2   Years of education: Not on file   Highest education level: Not on file  Occupational History   Occupation: works prn at [employer]    Employer:   Tobacco Use   Smoking status: Former    Packs/day: 0.25    Types: Cigarettes    Quit date: 09/25/2020    Years since quitting: 1.9   Smokeless tobacco: Never  Vaping Use   Vaping Use: Never used  Substance and Sexual Activity   Alcohol use: Yes    Alcohol/week: 0.0 standard drinks of alcohol    Comment: rare   Drug use: No   Sexual activity: Not Currently  Other Topics Concern   Not on file  Social History Narrative   2 children (daughter and son) both local. 3 grandchildren, one great grandchild   Works at ED front desk   Divorced (married x 30 years)   Social Determinants of Health   Financial Resource Strain: Low Risk  (07/16/2021)   Overall Financial Resource Strain (CARDIA)    Difficulty of Paying Living Expenses: Not hard at all  Food Insecurity: No Food Insecurity (07/16/2021)   Hunger Vital Sign    Worried About Running Out of Food in the Last Year: Never true    Ran Out of Food in the Last Year: Never true  Transportation Needs: No Transportation Needs (07/16/2021)   PRAPARE - Hydrologist (Medical): No    Lack of Transportation (Non-Medical): No  Physical Activity:  Insufficiently Active (07/16/2021)   Exercise Vital Sign    Days of Exercise per Week: 3 days    Minutes of Exercise per Session: 30 min  Stress: No Stress Concern Present (07/16/2021)   [place]    Feeling of Stress : Not at all  Social Connections: Moderately Isolated (07/16/2021)   Social Connection and Isolation Panel [NHANES]    Frequency of Communication with  Friends and Family: More than three times a week    Frequency of Social Gatherings with Friends and Family: More than three times a week    Attends Religious Services: More than 4 times per year    Active Member of [employer] or Organizations: No    Attends Music therapist: Never    Marital Status: Divorced    Tobacco Counseling Counseling given: Not Answered   Clinical Intake:  Pre-visit preparation completed: Yes        Diabetes: Yes CBG done?: No Did pt. bring in CBG monitor from home?: No (audio visit)  How often do you need to have someone help you when you read instructions, pamphlets, or other written materials from your doctor or pharmacy?: 1 - Never  Diabetic? Yes Nutrition Risk Assessment:  Has the patient had any N/V/D within the last 2 months?  Yes  only when she had blocked bowel recently Does the patient have any non-healing wounds?  No  Has the patient had any unintentional weight loss or weight gain?  Yes  with blocked bowel  Diabetes:  Is the patient diabetic?  Yes  If diabetic, was a CBG obtained today?  No  Did the patient bring in their glucometer from home?   Audio visit How often do you monitor your CBG's? never.   Financial Strains and Diabetes Management:  Are you having any financial strains with the device, your supplies or your medication? No .  Does the patient want to be seen by Chronic Care Management for management of their diabetes?  No  Would the patient like to be referred to a Nutritionist or for  Diabetic Management?  No   Diabetic Exams:  Diabetic Eye Exam: Completed 07/25/22 Diabetic Foot Exam: Overdue, Pt has been advised about the importance in completing this exam. Pt is scheduled for diabetic foot exam on N/a.   Interpreter Needed?: No  Information entered by :: Beatris Ship, Rew   Activities of Daily Living    08/30/2022    1:10 PM 07/11/2022    8:53 PM  In your present state of health, do you have any difficulty performing the following activities:  Hearing? 0   Vision? 0   Difficulty concentrating or making decisions? 0   Walking or climbing stairs? 1   Dressing or bathing? 0   Doing errands, shopping? 0 0  Preparing Food and eating ? N   Using the Toilet? N   In the past six months, have you accidently leaked urine? Y   Comment usually when she first gets up in the morning with a very full bladder   Do you have problems with loss of bowel control? N   Managing your Medications? N   Managing your Finances? N   Housekeeping or managing your Housekeeping? N     Patient Care Team: [name], NP as PCP - General  Indicate any recent Medical Services you may have received from other than [facility] providers in the past year (date may be approximate).     Assessment:   This is a routine wellness examination for Jordan [name].  Hearing/Vision screen No results found.  Dietary issues and exercise activities discussed: Current Exercise Habits: The patient does not participate in regular exercise at present (works in yard), Exercise limited by: orthopedic condition(s)   Goals Addressed   None    Depression Screen    07/23/2022   11:27 AM 03/20/2022   10:47 AM 07/16/2021    8:30 AM  06/26/2021    7:45 AM 08/31/2020   11:13 AM 10/21/2019   10:15 AM 12/11/2018   10:26 AM  PHQ 2/9 Scores  PHQ - 2 Score 0 0 0 0 1 0 0  PHQ- 9 Score 0 2   2  0    Fall Risk    08/30/2022    1:07 PM 07/23/2022   11:27 AM 07/16/2021    8:28 AM 06/26/2021    7:45 AM 07/18/2020    2:21  PM  [place] in the past year? 0 0 '1 1 1  '$ Comment     Emmi Telephone Survey: data to providers prior to load  Number falls in past yr: 0 0 '1 1 1  '$ Comment     Emmi Telephone Survey Actual Response = 2  Injury with Fall? 0 0 0 0 0  Risk for fall due to : No Fall Risks      Follow up Falls evaluation completed  Falls prevention discussed      FALL RISK PREVENTION PERTAINING TO THE HOME:  Any stairs in or around the home? Yes  If so, are there any without handrails? No  Home free of loose throw rugs in walkways, pet beds, electrical cords, etc? Yes  Adequate lighting in your home to reduce risk of falls? Yes   ASSISTIVE DEVICES UTILIZED TO PREVENT FALLS:  Life alert? No  Use of a cane, walker or w/c? Yes  Grab bars in the bathroom? No  Shower chair or bench in shower? Yes  Elevated toilet seat or a handicapped toilet? No    Cognitive Function:    01/07/2017   10:08 AM  MMSE - Mini Mental State Exam  Orientation to time 5  Orientation to Place 5  Registration 3  Attention/ Calculation 5  Recall 2  Language- name 2 objects 2  Language- repeat 1  Language- follow 3 step command 3  Language- read & follow direction 1  Write a sentence 1  Copy design 1  Total score 29        08/30/2022    1:24 PM  6CIT Screen  What Year? 0 points  What month? 0 points  What time? 0 points  Count back from 20 0 points  Months in reverse 4 points  Repeat phrase 2 points  Total Score 6 points    Immunizations Immunization History  Administered Date(s) Administered   Fluad Quad(high Dose 65+) 10/07/2019, 10/27/2020, 10/12/2021   Influenza Whole 10/12/2009   Influenza, High Dose Seasonal PF 09/02/2017, 09/07/2018   Influenza-Unspecified 09/21/2013, 08/23/2014, 09/22/2016   PFIZER(Purple Top)SARS-COV-2 Vaccination 07/26/2020, 08/16/2020   Pneumococcal Conjugate-13 09/04/2016   Pneumococcal Polysaccharide-23 12/23/2013, 10/07/2019   Td 12/23/2004   Tdap 08/11/2015   Zoster  Recombinat (Shingrix) 06/26/2021, 11/01/2021   Zoster, Live 06/19/2011    TDAP status: Up to date  Flu Vaccine status: Due, Education has been provided regarding the importance of this vaccine. Advised may receive this vaccine at local pharmacy or Health Dept. Aware to provide a copy of the vaccination record if obtained from local pharmacy or Health Dept. Verbalized acceptance and understanding.  Pneumococcal vaccine status: Up to date  Covid-19 vaccine status: Information provided on how to obtain vaccines.   Qualifies for Shingles Vaccine? Yes   Zostavax completed Yes   Shingrix Completed?: Yes  Screening Tests Health Maintenance  Topic Date Due   COVID-19 Vaccine (3 - Pfizer risk series) 09/13/2020   INFLUENZA VACCINE  07/23/2022  HEMOGLOBIN A1C  01/23/2023   OPHTHALMOLOGY EXAM  07/26/2023   MAMMOGRAM  07/31/2023   TETANUS/TDAP  08/10/2025   COLONOSCOPY (Pts 45-70yr Insurance coverage will need to be confirmed)  09/21/2026   Pneumonia Vaccine 72 Years old  Completed   DEXA SCAN  Completed   Hepatitis C Screening  Completed   Zoster Vaccines- Shingrix  Completed   HPV VACCINES  Aged Out   FOOT EXAM  Discontinued    Health Maintenance  Health Maintenance Due  Topic Date Due   COVID-19 Vaccine (3 - Pfizer risk series) 09/13/2020   INFLUENZA VACCINE  07/23/2022    Colorectal cancer screening: Type of screening: Colonoscopy. Completed 09/21/21. Repeat every 5 years  Mammogram status: Completed 07/30/21. Repeat every year  Bone Density status: Completed 10/21/18. Results reflect: Bone density results: NORMAL. Repeat every 2 years.  Lung Cancer Screening: (Low Dose CT Chest recommended if Age 72-80years, 30 pack-year currently smoking OR have quit w/in 15years.) does not qualify.   Lung Cancer Screening Referral: N/a  Additional Screening:  Hepatitis C Screening: does qualify; Completed 10/09/20  Vision Screening: Recommended annual ophthalmology exams for early  detection of glaucoma and other disorders of the eye. Is the patient up to date with their annual eye exam?  Yes  Who is the provider or what is the name of the office in which the patient attends annual eye exams? [facility]  If pt is not established with a provider, would they like to be referred to a provider to establish care? No .   Dental Screening: Recommended annual dental exams for proper oral hygiene  Community Resource Referral / Chronic Care Management: CRR required this visit?  No   CCM required this visit?  No      Plan:     I have personally reviewed and noted the following in the patient's chart:   Medical and social history Use of alcohol, tobacco or illicit drugs  Current medications and supplements including opioid prescriptions. Patient is not currently taking opioid prescriptions. Functional ability and status Nutritional status Physical activity Advanced directives List of other physicians Hospitalizations, surgeries, and ER visits in previous 12 months Vitals Screenings to include cognitive, depression, and falls Referrals and appointments  In addition, I have reviewed and discussed with patient certain preventive protocols, quality metrics, and best practice recommendations. A written personalized care plan for preventive services as well as general preventive health recommendations were provided to patient.  Due to this being a telephonic visit, the after visit summary with patients personalized plan was offered to patient via mail or my-chart. Patient would like to access on my-chart      BBeatris Ship [place]  08/30/2022   Nurse Notes: None

## 2022-09-05 ENCOUNTER — Other Ambulatory Visit (HOSPITAL_BASED_OUTPATIENT_CLINIC_OR_DEPARTMENT_OTHER): Payer: Self-pay

## 2022-09-07 ENCOUNTER — Other Ambulatory Visit: Payer: Self-pay | Admitting: Family

## 2022-09-08 MED ORDER — TRIAMTERENE-HCTZ 37.5-25 MG PO CAPS
1.0000 | ORAL_CAPSULE | Freq: Every day | ORAL | 1 refills | Status: DC
  Filled 2022-09-08: qty 90, 90d supply, fill #0
  Filled 2022-12-13: qty 90, 90d supply, fill #1

## 2022-09-09 ENCOUNTER — Other Ambulatory Visit (HOSPITAL_BASED_OUTPATIENT_CLINIC_OR_DEPARTMENT_OTHER): Payer: Self-pay

## 2022-09-11 ENCOUNTER — Other Ambulatory Visit (HOSPITAL_BASED_OUTPATIENT_CLINIC_OR_DEPARTMENT_OTHER): Payer: Self-pay

## 2022-10-14 DIAGNOSIS — M5416 Radiculopathy, lumbar region: Secondary | ICD-10-CM | POA: Diagnosis not present

## 2022-10-14 DIAGNOSIS — M5116 Intervertebral disc disorders with radiculopathy, lumbar region: Secondary | ICD-10-CM | POA: Diagnosis not present

## 2022-10-17 ENCOUNTER — Other Ambulatory Visit (HOSPITAL_BASED_OUTPATIENT_CLINIC_OR_DEPARTMENT_OTHER): Payer: Self-pay

## 2022-10-17 MED ORDER — INFLUENZA VAC A&B SA ADJ QUAD 0.5 ML IM PRSY
PREFILLED_SYRINGE | INTRAMUSCULAR | 0 refills | Status: DC
  Filled 2022-10-17: qty 0.5, 1d supply, fill #0

## 2022-10-21 ENCOUNTER — Other Ambulatory Visit: Payer: Self-pay | Admitting: Family

## 2022-10-23 ENCOUNTER — Ambulatory Visit: Payer: PPO | Admitting: Family

## 2022-10-23 ENCOUNTER — Other Ambulatory Visit (HOSPITAL_BASED_OUTPATIENT_CLINIC_OR_DEPARTMENT_OTHER): Payer: Self-pay

## 2022-10-23 MED ORDER — PANTOPRAZOLE SODIUM 40 MG PO TBEC
DELAYED_RELEASE_TABLET | Freq: Every day | ORAL | 1 refills | Status: DC
Start: 2022-10-23 — End: 2023-04-20
  Filled 2022-10-23: qty 90, 90d supply, fill #0
  Filled 2023-01-21: qty 90, 90d supply, fill #1

## 2022-10-28 ENCOUNTER — Other Ambulatory Visit (HOSPITAL_BASED_OUTPATIENT_CLINIC_OR_DEPARTMENT_OTHER): Payer: Self-pay

## 2022-10-28 ENCOUNTER — Ambulatory Visit (INDEPENDENT_AMBULATORY_CARE_PROVIDER_SITE_OTHER): Payer: PPO | Admitting: Family

## 2022-10-28 VITALS — BP 135/85 | HR 77 | Temp 97.9°F | Resp 16 | Wt 260.0 lb

## 2022-10-28 DIAGNOSIS — M1990 Unspecified osteoarthritis, unspecified site: Secondary | ICD-10-CM | POA: Diagnosis not present

## 2022-10-28 DIAGNOSIS — K219 Gastro-esophageal reflux disease without esophagitis: Secondary | ICD-10-CM | POA: Diagnosis not present

## 2022-10-28 DIAGNOSIS — F320 Major depressive disorder, single episode, mild: Secondary | ICD-10-CM | POA: Diagnosis not present

## 2022-10-28 DIAGNOSIS — E785 Hyperlipidemia, unspecified: Secondary | ICD-10-CM

## 2022-10-28 DIAGNOSIS — I10 Essential (primary) hypertension: Secondary | ICD-10-CM | POA: Diagnosis not present

## 2022-10-28 DIAGNOSIS — M109 Gout, unspecified: Secondary | ICD-10-CM

## 2022-10-28 DIAGNOSIS — E119 Type 2 diabetes mellitus without complications: Secondary | ICD-10-CM

## 2022-10-28 LAB — MICROALBUMIN / CREATININE URINE RATIO
Creatinine,U: 69.8 mg/dL
Microalb Creat Ratio: 21.9 mg/g (ref 0.0–30.0)
Microalb, Ur: 15.3 mg/dL — ABNORMAL HIGH (ref 0.0–1.9)

## 2022-10-28 LAB — COMPREHENSIVE METABOLIC PANEL
ALT: 21 U/L (ref 0–35)
AST: 16 U/L (ref 0–37)
Albumin: 4.3 g/dL (ref 3.5–5.2)
Alkaline Phosphatase: 75 U/L (ref 39–117)
BUN: 29 mg/dL — ABNORMAL HIGH (ref 6–23)
CO2: 26 mEq/L (ref 19–32)
Calcium: 9.5 mg/dL (ref 8.4–10.5)
Chloride: 105 mEq/L (ref 96–112)
Creatinine, Ser: 1.04 mg/dL (ref 0.40–1.20)
GFR: 53.85 mL/min — ABNORMAL LOW (ref >60.00)
Glucose, Bld: 101 mg/dL — ABNORMAL HIGH (ref 70–99)
Potassium: 4.9 mEq/L (ref 3.5–5.1)
Sodium: 138 mEq/L (ref 135–145)
Total Bilirubin: 0.5 mg/dL (ref 0.2–1.2)
Total Protein: 6.5 g/dL (ref 6.0–8.3)

## 2022-10-28 LAB — LIPID PANEL
Cholesterol: 158 mg/dL (ref 0–200)
HDL: 68.1 mg/dL (ref >39.00)
LDL Cholesterol: 75 mg/dL (ref 0–99)
NonHDL: 90.13
Total CHOL/HDL Ratio: 2
Triglycerides: 75 mg/dL (ref 0.0–149.0)
VLDL: 15 mg/dL (ref 0.0–40.0)

## 2022-10-28 LAB — HEMOGLOBIN A1C: Hgb A1c MFr Bld: 6.4 % (ref 4.6–6.5)

## 2022-10-28 MED ORDER — ALLOPURINOL 100 MG PO TABS
200.0000 mg | ORAL_TABLET | Freq: Every day | ORAL | 1 refills | Status: DC
  Filled 2022-10-28: qty 180, 90d supply, fill #0
  Filled 2023-02-13: qty 180, 90d supply, fill #1

## 2022-10-28 MED ORDER — COMIRNATY 30 MCG/0.3ML IM SUSY
PREFILLED_SYRINGE | INTRAMUSCULAR | 0 refills | Status: AC
  Filled 2022-10-28: qty 0.3, 1d supply, fill #0

## 2022-10-28 MED ORDER — DULOXETINE HCL 60 MG PO CPEP
60.0000 mg | ORAL_CAPSULE | Freq: Every day | ORAL | 1 refills | Status: DC
  Filled 2022-10-28: qty 90, 90d supply, fill #0
  Filled 2023-02-13: qty 90, 90d supply, fill #1

## 2022-10-28 NOTE — Assessment & Plan Note (Signed)
Stable on allopurinol '200mg'$  once daily.

## 2022-10-28 NOTE — Assessment & Plan Note (Signed)
Mood is stable on cymbalta. Continue same.

## 2022-10-28 NOTE — Assessment & Plan Note (Signed)
Rarely using diclofenac.

## 2022-10-28 NOTE — Progress Notes (Shared)
Subjective:   By signing my name below, I, Jordan [name], attest that this documentation has been prepared under the direction and in the presence of [name], NP. 10/28/2022     Patient ID: Jordan [name], female    DOB: December 24, 1950, 71 y.o.   MRN: [id number]  Chief Complaint  Patient presents with   Hypertension    Here for follow up   Gastroesophageal Reflux    Here for follow up    Patient is in today for a follow up visit.   Dizziness She complains of occasional dizziness that typically occurs while laying down or getting up from a laying position. She has a history of vertigo.   Cramps- She complains of occasional painful cramps in her hands. She is taking OTC potassium supplements to manage her symptoms.   Blood pressure- Her blood pressure is elevated during this visit. She continues taking 37.5-25 mg Dyazide daily PO and reports no new issues while taking it.   BP Readings from Last 3 Encounters:  10/28/22 135/85  07/23/22 (!) 154/78  07/14/22 (!) 152/75   Pulse Readings from Last 3 Encounters:  10/28/22 77  07/23/22 81  07/14/22 65   A1c- Her A1c improved last time it was taken. Lab Results  Component Value Date   HGBA1C 6.4 10/28/2022   Mood- Her mood is good while on 60 mg Cymbalta daily PO. She is requesting a refill on it as well.   Gout- She has no recent gout flare ups. She continues taking 200 mg allopurinol daily PO and reports no new issues while taking it. She is requesting a refill on it as well.    Voltaren- She only takes 75 mg Voltaren a week prior and after injections. Otherwise she takes it as needed.   Reflux- She continues taking 40 mg Protonix daily PO and reports no new issues while taking it.   Immunizations- She is UTD on flu vaccines this year. She was recommended to receive the new Covid-19 booster vaccine at her pharmacy.    Health Maintenance Due  Topic Date Due   COVID-19 Vaccine (3 - Pfizer risk series) 09/13/2020     Past Medical History:  Diagnosis Date   Acute hypoxemic respiratory failure due to COVID-19 ([place]) 10/08/2020   Cellulitis and abscess of finger, unspecified 401.9   Cellulitis and abscess of foot, except toes    Depressive disorder, not elsewhere classified    GERD (gastroesophageal reflux disease)    Gout, unspecified    History of tobacco abuse 12/01/2018   Hyperglycemia 05/08/2011   Hyperlipidemia    borderline- not on meds   Hypertension    Insomnia    Leg edema    chronic   Obesity, unspecified    Osteoarthritis    Renal insufficiency, mild 05/08/2011   SBO (small bowel obstruction) ([place]) 12/01/2018   Small bowel obstruction ([place])     Past Surgical History:  Procedure Laterality Date   ABDOMINAL HYSTERECTOMY  2002   infection- no history of cancer   APPENDECTOMY  2002   catacact Right 11/01/2019   [place]   right shoulder rotator cuff repair  06-07-10   TOTAL HIP ARTHROPLASTY Left 1998    Family History  Problem Relation Age of Onset   Diabetes Mother    Cancer Mother        colon/ pancreatic   Colon cancer Mother 96   Hypertension Father  Alcohol abuse Father    Cirrhosis Father    Other Sister        Pyeoderma gangrenosis   Diabetes Sister        type 2   Kidney failure Sister    Liver disease Sister    Cirrhosis Sister        had NASH, died heart failure   Heart attack Brother    Stomach cancer Maternal Grandfather    Obesity Daughter    Rectal cancer Neg Hx    Esophageal cancer Neg Hx     Social History   Socioeconomic History   Marital status: Divorced    Spouse name: Not on file   Number of children: 2   Years of education: Not on file   Highest education level: Not on file  Occupational History   Occupation: works prn at [employer]    Employer: [employer]  Tobacco Use   Smoking status: Former    Packs/day: 0.25    Types: Cigarettes    Quit date: 09/25/2020    Years since quitting: 2.0    Smokeless tobacco: Never  Vaping Use   Vaping Use: Never used  Substance and Sexual Activity   Alcohol use: Yes    Alcohol/week: 0.0 standard drinks of alcohol    Comment: rare   Drug use: No   Sexual activity: Not Currently  Other Topics Concern   Not on file  Social History Narrative   2 children (daughter and son) both local. 3 grandchildren, one great grandchild   Works at ED front desk   Divorced (married x 30 years)   Social Determinants of Health   Financial Resource Strain: Low Risk  (07/16/2021)   Overall Financial Resource Strain (CARDIA)    Difficulty of Paying Living Expenses: Not hard at all  Food Insecurity: No Food Insecurity (07/16/2021)   Hunger Vital Sign    Worried About Running Out of Food in the Last Year: Never true    Ran Out of Food in the Last Year: Never true  Transportation Needs: No Transportation Needs (07/16/2021)   PRAPARE - Hydrologist (Medical): No    Lack of Transportation (Non-Medical): No  Physical Activity: Insufficiently Active (07/16/2021)   Exercise Vital Sign    Days of Exercise per Week: 3 days    Minutes of Exercise per Session: 30 min  Stress: No Stress Concern Present (07/16/2021)   [place]    Feeling of Stress : Not at all  Social Connections: Moderately Isolated (07/16/2021)   Social Connection and Isolation Panel [NHANES]    Frequency of Communication with Friends and Family: More than three times a week    Frequency of Social Gatherings with Friends and Family: More than three times a week    Attends Religious Services: More than 4 times per year    Active Member of [employer] or Organizations: No    Attends Archivist Meetings: Never    Marital Status: Divorced  Human resources officer Violence: Not At Risk (07/16/2021)   Humiliation, Afraid, Rape, and Kick questionnaire    Fear of Current or Ex-Partner: No    Emotionally Abused:  No    Physically Abused: No    Sexually Abused: No    Outpatient Medications Prior to Visit  Medication Sig Dispense Refill   aspirin EC 81 MG tablet Take 81 mg by mouth daily.     diclofenac (VOLTAREN) 75 MG  EC tablet Take 1 tablet by mouth 2 times daily 60 tablet 2   furosemide (LASIX) 40 MG tablet TAKE 1 TABLET BY MOUTH ONCE DAILY AS NEEDED (Patient taking differently: Take 40 mg by mouth daily as needed for edema.) 90 tablet 1   influenza vaccine adjuvanted (FLUAD) 0.5 ML injection Inject into the muscle. 0.5 mL 0   Multiple Vitamin (MULTIVITAMIN WITH MINERALS) TABS tablet Take 1 tablet by mouth daily.     pantoprazole (PROTONIX) 40 MG tablet TAKE 1 TABLET BY MOUTH ONCE DAILY 90 tablet 1   triamterene-hydrochlorothiazide (DYAZIDE) 37.5-25 MG capsule Take 1 capsule by mouth daily. 90 capsule 1   allopurinol (ZYLOPRIM) 100 MG tablet Take 2 tablets (200 mg total) by mouth daily. 180 tablet 0   DULoxetine (CYMBALTA) 60 MG capsule Take 1 capsule (60 mg total) by mouth daily. 90 capsule 0   No facility-administered medications prior to visit.    No Known Allergies  Review of Systems  Musculoskeletal:        (+)occasional cramping pain in bilateral hands.   Neurological:  Positive for dizziness.       Objective:    Physical Exam Constitutional:      General: She is not in acute distress.    Appearance: Normal appearance. She is not ill-appearing.  HENT:     Head: Normocephalic and atraumatic.     Right Ear: External ear normal.     Left Ear: External ear normal.  Eyes:     Extraocular Movements: Extraocular movements intact.     Pupils: Pupils are equal, round, and reactive to light.  Cardiovascular:     Rate and Rhythm: Normal rate and regular rhythm.     Heart sounds: Normal heart sounds. No murmur heard.    No gallop.     Comments: Her blood pressure measured 135/85 during the manual recheck Pulmonary:     Effort: Pulmonary effort is normal. No respiratory distress.      Breath sounds: Normal breath sounds. No wheezing or rales.  Skin:    General: Skin is warm and dry.  Neurological:     Mental Status: She is alert and oriented to person, place, and time.  Psychiatric:        Judgment: Judgment normal.     BP 135/85   Pulse 77   Temp 97.9 F (36.6 C) (Oral)   Resp 16   Wt 260 lb (117.9 kg)   SpO2 100%   BMI 44.63 kg/m  Wt Readings from Last 3 Encounters:  10/28/22 260 lb (117.9 kg)  07/23/22 248 lb (112.5 kg)  07/14/22 251 lb 12.3 oz (114.2 kg)       Assessment & Plan:   Problem List Items Addressed This Visit       Unprioritized   Osteoarthritis    Rarely using diclofenac.        Relevant Medications   allopurinol (ZYLOPRIM) 100 MG tablet   Hyperlipidemia    Lab Results  Component Value Date   CHOL 158 10/28/2022   HDL 68.10 10/28/2022   LDLCALC 75 10/28/2022   LDLDIRECT 111.0 07/20/2019   TRIG 75.0 10/28/2022   CHOLHDL 2 10/28/2022  Lipids look very good despite not being on a statin medication. Monitor.       Gout    Stable on allopurinol 296m once daily.       GERD (gastroesophageal reflux disease)    Stable on protonix 471m  Continue same.  Essential hypertension - Primary    BP Readings from Last 3 Encounters:  10/28/22 (!) 163/84  07/23/22 (!) 154/78  07/14/22 (!) 152/75  Maintained on dyazide.  Uncontrolled. Add hctz 41m once daily.       Diabetes type 2, controlled ([place]    Lab Results  Component Value Date   HGBA1C 6.5 07/23/2022   HGBA1C 6.9 (H) 03/08/2022   HGBA1C 6.5 06/26/2021   Lab Results  Component Value Date   MICROALBUR <0.7 06/10/2018   LDLCALC 93 06/10/2018   CREATININE 1.07 08/12/2022  Urine microalbumin is elevated. Will rx with lisinopril for renal protection and bp control.       Relevant Orders   Hemoglobin A1c (Completed)   Urine Microalbumin w/creat. ratio (Completed)   Comp Met (CMET) (Completed)   Lipid panel (Completed)   Depression    Mood is stable on  cymbalta. Continue same.       Relevant Medications   DULoxetine (CYMBALTA) 60 MG capsule     Meds ordered this encounter  Medications   DULoxetine (CYMBALTA) 60 MG capsule    Sig: Take 1 capsule (60 mg total) by mouth daily.    Dispense:  90 capsule    Refill:  1    Order Specific Question:   Supervising Provider    Answer:   BPenni HomansA [[id number]]   allopurinol (ZYLOPRIM) 100 MG tablet    Sig: Take 2 tablets (200 mg total) by mouth daily.    Dispense:  180 tablet    Refill:  1    Order Specific Question:   Supervising Provider    Answer:   BPenni HomansA [[id number]]    I, MNance Pear NP, personally preformed the services described in this documentation.  All medical record entries made by the scribe were at my direction and in my presence.  I have reviewed the chart and discharge instructions (if applicable) and agree that the record reflects my personal performance and is accurate and complete. 10/28/2022   I,Jordan [name],acting as a scribe for MNance Pear NP.,have documented all relevant documentation on the behalf of MNance Pear NP,as directed by  MNance Pear NP while in the presence of MNance Pear NP.   MNance Pear NP

## 2022-10-28 NOTE — Assessment & Plan Note (Signed)
BP Readings from Last 3 Encounters:  10/28/22 (!) 163/84  07/23/22 (!) 154/78  07/14/22 (!) 152/75   Maintained on dyazide.  Uncontrolled. Add hctz '10mg'$  once daily.

## 2022-10-28 NOTE — Assessment & Plan Note (Signed)
Lab Results  Component Value Date   HGBA1C 6.5 07/23/2022   HGBA1C 6.9 (H) 03/08/2022   HGBA1C 6.5 06/26/2021   Lab Results  Component Value Date   MICROALBUR <0.7 06/10/2018   LDLCALC 93 06/10/2018   CREATININE 1.07 08/12/2022   Urine microalbumin is elevated. Will rx with lisinopril for renal protection and bp control.

## 2022-10-28 NOTE — Assessment & Plan Note (Signed)
Stable on protonix '40mg'$ .  Continue same.

## 2022-10-29 ENCOUNTER — Telehealth: Payer: Self-pay | Admitting: Family

## 2022-10-29 ENCOUNTER — Other Ambulatory Visit (HOSPITAL_BASED_OUTPATIENT_CLINIC_OR_DEPARTMENT_OTHER): Payer: Self-pay

## 2022-10-29 MED ORDER — LISINOPRIL 10 MG PO TABS
10.0000 mg | ORAL_TABLET | Freq: Every day | ORAL | 1 refills | Status: DC
  Filled 2022-10-29: qty 90, 90d supply, fill #0
  Filled 2023-01-21: qty 90, 90d supply, fill #1

## 2022-10-29 NOTE — Telephone Encounter (Signed)
Please advise pt that cholesterol looks very good- no need for cholesterol medication.  She does have some protein in her urine from the diabetes. I would like her to add lisinopril '10mg'$  once daily. This will help protect her kidneys as well as manage her blood pressure.  Follow up in 1 week with me for blood pressure recheck and follow up blood work.

## 2022-10-29 NOTE — Telephone Encounter (Signed)
Patient advised of results and provider's recommendations. She was scheduled to come in nexe week for follow up

## 2022-10-29 NOTE — Assessment & Plan Note (Signed)
Lab Results  Component Value Date   CHOL 158 10/28/2022   HDL 68.10 10/28/2022   LDLCALC 75 10/28/2022   LDLDIRECT 111.0 07/20/2019   TRIG 75.0 10/28/2022   CHOLHDL 2 10/28/2022   Lipids look very good despite not being on a statin medication. Monitor.

## 2022-11-06 ENCOUNTER — Encounter: Payer: Self-pay | Admitting: Family

## 2022-11-06 ENCOUNTER — Ambulatory Visit (INDEPENDENT_AMBULATORY_CARE_PROVIDER_SITE_OTHER): Payer: PPO | Admitting: Family

## 2022-11-06 VITALS — BP 128/72 | HR 66 | Temp 97.5°F | Ht 64.0 in | Wt 260.0 lb

## 2022-11-06 DIAGNOSIS — I10 Essential (primary) hypertension: Secondary | ICD-10-CM | POA: Diagnosis not present

## 2022-11-06 DIAGNOSIS — S0591XA Unspecified injury of right eye and orbit, initial encounter: Secondary | ICD-10-CM | POA: Insufficient documentation

## 2022-11-06 DIAGNOSIS — E039 Hypothyroidism, unspecified: Secondary | ICD-10-CM | POA: Diagnosis not present

## 2022-11-06 LAB — BASIC METABOLIC PANEL
BUN: 22 mg/dL (ref 6–23)
CO2: 26 mEq/L (ref 19–32)
Calcium: 9.2 mg/dL (ref 8.4–10.5)
Chloride: 107 mEq/L (ref 96–112)
Creatinine, Ser: 0.95 mg/dL (ref 0.40–1.20)
GFR: 60.02 mL/min (ref >60.00)
Glucose, Bld: 84 mg/dL (ref 70–99)
Potassium: 5 mEq/L (ref 3.5–5.1)
Sodium: 139 mEq/L (ref 135–145)

## 2022-11-06 LAB — TSH: TSH: 3.02 u[IU]/mL (ref 0.35–5.50)

## 2022-11-06 NOTE — Progress Notes (Signed)
Subjective:   By signing my name below, I, Jordan [name], attest that this documentation has been prepared under the direction and in the presence of [name], NP. 11/06/2022    Patient ID: Jordan [name], female    DOB: 1951-02-21, 71 y.o.   MRN: [id number]  No chief complaint on file.   HPI Patient is in today for a follow up visit.   Right eye irritation- Her right eye is red and irritated. Her dog scratched her eye while she was in bed on 11/02/22.  She had pain following the incident but reports it has reduced since. She denies any vision changes at this time but she does have mild blurring in her vision on 11/12 and 11/13. She has not seen an eye doctor but is planning on scheduling an appointment.   Blood pressure- Her blood pressure was elevated during last visit and she was put on 10 mg lisinopril and reports no new issues while taking it.  She continues dyazide.  BP Readings from Last 3 Encounters:  11/06/22 128/72  10/28/22 135/85  07/23/22 (!) 154/78   Pulse Readings from Last 3 Encounters:  11/06/22 66  10/28/22 77  07/23/22 81    Past Medical History:  Diagnosis Date   Acute hypoxemic respiratory failure due to COVID-19 ([place]) 10/08/2020   Cellulitis and abscess of finger, unspecified 401.9   Cellulitis and abscess of foot, except toes    Depressive disorder, not elsewhere classified    GERD (gastroesophageal reflux disease)    Gout, unspecified    History of tobacco abuse 12/01/2018   Hyperglycemia 05/08/2011   Hyperlipidemia    borderline- not on meds   Hypertension    Insomnia    Leg edema    chronic   Obesity, unspecified    Osteoarthritis    Renal insufficiency, mild 05/08/2011   SBO (small bowel obstruction) ([place]) 12/01/2018   Small bowel obstruction ([place])     Past Surgical History:  Procedure Laterality Date   ABDOMINAL HYSTERECTOMY  2002   infection- no history of cancer   APPENDECTOMY  2002   catacact Right 11/01/2019    [place]   right shoulder rotator cuff repair  06-07-10   TOTAL HIP ARTHROPLASTY Left 1998    Family History  Problem Relation Age of Onset   Diabetes Mother    Cancer Mother        colon/ pancreatic   Colon cancer Mother 80   Hypertension Father    Alcohol abuse Father    Cirrhosis Father    Other Sister        Pyeoderma gangrenosis   Diabetes Sister        type 2   Kidney failure Sister    Liver disease Sister    Cirrhosis Sister        had NASH, died heart failure   Heart attack Brother    Stomach cancer Maternal Grandfather    Obesity Daughter    Rectal cancer Neg Hx    Esophageal cancer Neg Hx     Social History   Socioeconomic History   Marital status: Divorced    Spouse name: Not on file   Number of children: 2   Years of education: Not on file   Highest education level: Not on file  Occupational History   Occupation: works prn at [employer]    Employer: [employer]  Tobacco Use   Smoking status: Former  Packs/day: 0.25    Types: Cigarettes    Quit date: 09/25/2020    Years since quitting: 2.1   Smokeless tobacco: Never  Vaping Use   Vaping Use: Never used  Substance and Sexual Activity   Alcohol use: Yes    Alcohol/week: 0.0 standard drinks of alcohol    Comment: rare   Drug use: No   Sexual activity: Not Currently  Other Topics Concern   Not on file  Social History Narrative   2 children (daughter and son) both local. 3 grandchildren, one great grandchild   Works at ED front desk   Divorced (married x 30 years)   Social Determinants of Health   Financial Resource Strain: Low Risk  (07/16/2021)   Overall Financial Resource Strain (CARDIA)    Difficulty of Paying Living Expenses: Not hard at all  Food Insecurity: No Food Insecurity (07/16/2021)   Hunger Vital Sign    Worried About Running Out of Food in the Last Year: Never true    Ran Out of Food in the Last Year: Never true  Transportation Needs: No  Transportation Needs (07/16/2021)   PRAPARE - Hydrologist (Medical): No    Lack of Transportation (Non-Medical): No  Physical Activity: Insufficiently Active (07/16/2021)   Exercise Vital Sign    Days of Exercise per Week: 3 days    Minutes of Exercise per Session: 30 min  Stress: No Stress Concern Present (07/16/2021)   [place]    Feeling of Stress : Not at all  Social Connections: Moderately Isolated (07/16/2021)   Social Connection and Isolation Panel [NHANES]    Frequency of Communication with Friends and Family: More than three times a week    Frequency of Social Gatherings with Friends and Family: More than three times a week    Attends Religious Services: More than 4 times per year    Active Member of [employer] or Organizations: No    Attends Archivist Meetings: Never    Marital Status: Divorced  Human resources officer Violence: Not At Risk (07/16/2021)   Humiliation, Afraid, Rape, and Kick questionnaire    Fear of Current or Ex-Partner: No    Emotionally Abused: No    Physically Abused: No    Sexually Abused: No    Outpatient Medications Prior to Visit  Medication Sig Dispense Refill   allopurinol (ZYLOPRIM) 100 MG tablet Take 2 tablets (200 mg total) by mouth daily. 180 tablet 1   aspirin EC 81 MG tablet Take 81 mg by mouth daily.     COVID-19 mRNA vaccine 2023-2024 (COMIRNATY) syringe Inject into the muscle. 0.3 mL 0   diclofenac (VOLTAREN) 75 MG EC tablet Take 1 tablet by mouth 2 times daily 60 tablet 2   DULoxetine (CYMBALTA) 60 MG capsule Take 1 capsule (60 mg total) by mouth daily. 90 capsule 1   furosemide (LASIX) 40 MG tablet TAKE 1 TABLET BY MOUTH ONCE DAILY AS NEEDED (Patient taking differently: Take 40 mg by mouth daily as needed for edema.) 90 tablet 1   influenza vaccine adjuvanted (FLUAD) 0.5 ML injection Inject into the muscle. 0.5 mL 0   lisinopril (ZESTRIL) 10 MG  tablet Take 1 tablet (10 mg total) by mouth daily. 90 tablet 1   Multiple Vitamin (MULTIVITAMIN WITH MINERALS) TABS tablet Take 1 tablet by mouth daily.     pantoprazole (PROTONIX) 40 MG tablet TAKE 1 TABLET BY MOUTH ONCE DAILY 90 tablet  1   triamterene-hydrochlorothiazide (DYAZIDE) 37.5-25 MG capsule Take 1 capsule by mouth daily. 90 capsule 1   No facility-administered medications prior to visit.    No Known Allergies  Review of Systems  Eyes:  Positive for blurred vision (right eye) and redness (right eye). Eye pain: right eye.      (-)vision changes       Objective:    Physical Exam Constitutional:      General: She is not in acute distress.    Appearance: Normal appearance. She is not ill-appearing.  HENT:     Head: Normocephalic and atraumatic.     Right Ear: External ear normal.     Left Ear: External ear normal.  Eyes:     Extraocular Movements: Extraocular movements intact.     Pupils: Pupils are equal, round, and reactive to light.      Comments: Right medial eye injected  Cardiovascular:     Rate and Rhythm: Normal rate and regular rhythm.     Heart sounds: Normal heart sounds. No murmur heard.    No gallop.     Comments: Her blood pressure measured 128/72 during manual recheck Pulmonary:     Effort: Pulmonary effort is normal. No respiratory distress.     Breath sounds: Normal breath sounds. No wheezing or rales.  Skin:    General: Skin is warm and dry.  Neurological:     Mental Status: She is alert and oriented to person, place, and time.  Psychiatric:        Judgment: Judgment normal.     BP 128/72   Pulse 66   Temp (!) 97.5 F (36.4 C) (Oral)   Ht '5\' 4"'$  (1.626 m)   Wt 260 lb (117.9 kg)   SpO2 97%   BMI 44.63 kg/m  Wt Readings from Last 3 Encounters:  11/06/22 260 lb (117.9 kg)  10/28/22 260 lb (117.9 kg)  07/23/22 248 lb (112.5 kg)       Assessment & Plan:   Problem List Items Addressed This Visit       Unprioritized   Right eye  injury    No current vision issues- she has an eye doctor. I advised her to call her eye doctor and notify them so that they can bring her in for an eye exam.       Hypothyroidism   Relevant Orders   TSH   Essential hypertension - Primary    BP Readings from Last 3 Encounters:  11/06/22 128/72  10/28/22 135/85  07/23/22 (!) 154/78  BP much improved with the addition of lisinopril '10mg'$ . Continue same, obtain follow up bmet.        Relevant Orders   Basic metabolic panel     No orders of the defined types were placed in this encounter.   I, Nance Pear, NP, personally preformed the services described in this documentation.  All medical record entries made by the scribe were at my direction and in my presence.  I have reviewed the chart and discharge instructions (if applicable) and agree that the record reflects my personal performance and is accurate and complete. 11/06/2022   I,Jordan [name],acting as a Education administrator for Nance Pear, NP.,have documented all relevant documentation on the behalf of Nance Pear, NP,as directed by  Nance Pear, NP while in the presence of Nance Pear, NP.   Nance Pear, NP

## 2022-11-06 NOTE — Assessment & Plan Note (Signed)
No current vision issues- she has an eye doctor. I advised her to call her eye doctor and notify them so that they can bring her in for an eye exam.

## 2022-11-06 NOTE — Assessment & Plan Note (Signed)
BP Readings from Last 3 Encounters:  11/06/22 128/72  10/28/22 135/85  07/23/22 (!) 154/78   BP much improved with the addition of lisinopril '10mg'$ . Continue same, obtain follow up bmet.

## 2022-12-13 ENCOUNTER — Other Ambulatory Visit (HOSPITAL_BASED_OUTPATIENT_CLINIC_OR_DEPARTMENT_OTHER): Payer: Self-pay

## 2023-01-08 DIAGNOSIS — Z6841 Body Mass Index (BMI) 40.0 and over, adult: Secondary | ICD-10-CM | POA: Diagnosis not present

## 2023-01-08 DIAGNOSIS — M4316 Spondylolisthesis, lumbar region: Secondary | ICD-10-CM | POA: Diagnosis not present

## 2023-01-09 ENCOUNTER — Telehealth: Payer: Self-pay | Admitting: Family

## 2023-01-09 ENCOUNTER — Other Ambulatory Visit: Payer: Self-pay

## 2023-01-09 ENCOUNTER — Other Ambulatory Visit (HOSPITAL_BASED_OUTPATIENT_CLINIC_OR_DEPARTMENT_OTHER): Payer: Self-pay | Admitting: Neurological Surgery

## 2023-01-09 ENCOUNTER — Other Ambulatory Visit (HOSPITAL_BASED_OUTPATIENT_CLINIC_OR_DEPARTMENT_OTHER): Payer: Self-pay

## 2023-01-09 DIAGNOSIS — M4316 Spondylolisthesis, lumbar region: Secondary | ICD-10-CM

## 2023-01-09 NOTE — Telephone Encounter (Signed)
Please contact pt to schedule a surgical clearance visit with me.

## 2023-01-10 ENCOUNTER — Other Ambulatory Visit (HOSPITAL_BASED_OUTPATIENT_CLINIC_OR_DEPARTMENT_OTHER): Payer: Self-pay

## 2023-01-10 MED ORDER — DICLOFENAC SODIUM 75 MG PO TBEC
75.0000 mg | DELAYED_RELEASE_TABLET | Freq: Two times a day (BID) | ORAL | 2 refills | Status: DC
  Filled 2023-01-10: qty 60, 30d supply, fill #0
  Filled 2023-02-13: qty 60, 30d supply, fill #1
  Filled 2023-04-10: qty 60, 30d supply, fill #2

## 2023-01-13 NOTE — Progress Notes (Signed)
Subjective:   By signing my name below, I, Jordan [name], attest that this documentation has been prepared under the direction and in the presence of Nance Pear, NP 01/14/2023   Patient ID: Jordan [name], female    DOB: 1951/12/08, 72 y.o.   MRN: [id number]  Chief Complaint  Patient presents with   Medical Clearance    Here for clearance prior to back surgery    HPI Patient is in today for surgical clearance. She is having spinal surgery on L4 and L5. It sounds like they are planning L4/L5 fusion.    Mood: She reports that her mood has been stable. She is compliant with Cymbalta 60 mg daily.   Hyperglycemia:  She is complaint with her medications.  Lab Results  Component Value Date   HGBA1C 6.4 10/28/2022   Hypertension: She has been complaint with her medications.  BP Readings from Last 3 Encounters:  01/14/23 126/60  11/06/22 128/72  10/28/22 135/85   Hyperlipidemia: Compliant with her medications. Lab Results  Component Value Date   CHOL 158 10/28/2022   HDL 68.10 10/28/2022   LDLCALC 75 10/28/2022   LDLDIRECT 111.0 07/20/2019   TRIG 75.0 10/28/2022   CHOLHDL 2 10/28/2022   Health maintenance: She is UTD on her routine vision exam. At her last vision exam, she was given new glasses.  She is getting a bone density test today.  She has been using a walker to get around better.   Past Medical History:  Diagnosis Date   Acute hypoxemic respiratory failure due to COVID-19 ([place]) 10/08/2020   Cellulitis and abscess of finger, unspecified 401.9   Cellulitis and abscess of foot, except toes    Depressive disorder, not elsewhere classified    GERD (gastroesophageal reflux disease)    Gout, unspecified    History of tobacco abuse 12/01/2018   Hyperglycemia 05/08/2011   Hyperlipidemia    borderline- not on meds   Hypertension    Insomnia    Leg edema    chronic   Obesity, unspecified    Osteoarthritis    Renal insufficiency, mild 05/08/2011   SBO  (small bowel obstruction) ([place]) 12/01/2018   Small bowel obstruction ([place])     Past Surgical History:  Procedure Laterality Date   ABDOMINAL HYSTERECTOMY  2002   infection- no history of cancer   APPENDECTOMY  2002   catacact Right 11/01/2019   CHOLECYSTECTOMY  1977   ECTOPIC PREGNANCY SURGERY  1971   right shoulder rotator cuff repair  06-07-10   TOTAL HIP ARTHROPLASTY Left 1998    Family History  Problem Relation Age of Onset   Diabetes Mother    Cancer Mother        colon/ pancreatic   Colon cancer Mother 83   Hypertension Father    Alcohol abuse Father    Cirrhosis Father    Other Sister        Pyeoderma gangrenosis   Diabetes Sister        type 2   Kidney failure Sister    Liver disease Sister    Cirrhosis Sister        had NASH, died heart failure   Heart attack Brother    Stomach cancer Maternal Grandfather    Obesity Daughter    Rectal cancer Neg Hx    Esophageal cancer Neg Hx     Social History   Socioeconomic History   Marital status: Divorced    Spouse name: Not on file  Number of children: 2   Years of education: Not on file   Highest education level: Not on file  Occupational History   Occupation: works prn at [employer]  Tobacco Use   Smoking status: Former    Packs/day: 0.25    Types: Cigarettes    Quit date: 09/25/2020    Years since quitting: 2.3   Smokeless tobacco: Never  Vaping Use   Vaping Use: Never used  Substance and Sexual Activity   Alcohol use: Yes    Alcohol/week: 0.0 standard drinks of alcohol    Comment: rare   Drug use: No   Sexual activity: Not Currently  Other Topics Concern   Not on file  Social History Narrative   2 children (daughter and son) both local. 3 grandchildren, one great grandchild   Works at ED front desk   Divorced (married x 30 years)   Social Determinants of Health   Financial Resource Strain: Low Risk  (07/16/2021)   Overall Financial Resource Strain (CARDIA)    Difficulty of  Paying Living Expenses: Not hard at all  Food Insecurity: No Food Insecurity (07/16/2021)   Hunger Vital Sign    Worried About Running Out of Food in the Last Year: Never true    Ran Out of Food in the Last Year: Never true  Transportation Needs: No Transportation Needs (07/16/2021)   PRAPARE - Hydrologist (Medical): No    Lack of Transportation (Non-Medical): No  Physical Activity: Insufficiently Active (07/16/2021)   Exercise Vital Sign    Days of Exercise per Week: 3 days    Minutes of Exercise per Session: 30 min  Stress: No Stress Concern Present (07/16/2021)   [name]    Feeling of Stress : Not at all  Social Connections: Moderately Isolated (07/16/2021)   Social Connection and Isolation Panel [NHANES]    Frequency of Communication with Friends and Family: More than three times a week    Frequency of Social Gatherings with Friends and Family: More than three times a week    Attends Religious Services: More than 4 times per year    Active Member of [employer] or Organizations: No    Attends Archivist Meetings: Never    Marital Status: Divorced  Human resources officer Violence: Not At Risk (07/16/2021)   Humiliation, Afraid, Rape, and Kick questionnaire    Fear of Current or Ex-Partner: No    Emotionally Abused: No    Physically Abused: No    Sexually Abused: No    Outpatient Medications Prior to Visit  Medication Sig Dispense Refill   allopurinol (ZYLOPRIM) 100 MG tablet Take 2 tablets (200 mg total) by mouth daily. 180 tablet 1   aspirin EC 81 MG tablet Take 81 mg by mouth daily.     COVID-19 mRNA vaccine 2023-2024 (COMIRNATY) syringe Inject into the muscle. 0.3 mL 0   diclofenac (VOLTAREN) 75 MG EC tablet Take 1 tablet by mouth 2 times daily 60 tablet 2   DULoxetine (CYMBALTA) 60 MG capsule Take 1 capsule (60 mg total) by mouth daily. 90 capsule 1   furosemide (LASIX) 40 MG tablet  TAKE 1 TABLET BY MOUTH ONCE DAILY AS NEEDED (Patient taking differently: Take 40 mg by mouth daily as needed for edema.) 90 tablet 1   influenza vaccine adjuvanted (FLUAD) 0.5 ML injection Inject into the muscle. 0.5 mL 0   lisinopril (ZESTRIL) 10  MG tablet Take 1 tablet (10 mg total) by mouth daily. 90 tablet 1   Multiple Vitamin (MULTIVITAMIN WITH MINERALS) TABS tablet Take 1 tablet by mouth daily.     pantoprazole (PROTONIX) 40 MG tablet TAKE 1 TABLET BY MOUTH ONCE DAILY 90 tablet 1   triamterene-hydrochlorothiazide (DYAZIDE) 37.5-25 MG capsule Take 1 capsule by mouth daily. 90 capsule 1   No facility-administered medications prior to visit.    No Known Allergies  ROS     Objective:    Physical Exam Constitutional:      General: She is not in acute distress.    Appearance: Normal appearance. She is not ill-appearing.  HENT:     Head: Normocephalic and atraumatic.     Right Ear: External ear normal.     Left Ear: External ear normal.  Eyes:     Extraocular Movements: Extraocular movements intact.     Pupils: Pupils are equal, round, and reactive to light.  Cardiovascular:     Rate and Rhythm: Normal rate and regular rhythm.     Heart sounds: Normal heart sounds. No murmur heard.    No gallop.  Pulmonary:     Effort: Pulmonary effort is normal. No respiratory distress.     Breath sounds: Normal breath sounds. No wheezing or rales.  Musculoskeletal:     Right lower leg: Edema present.     Left lower leg: Edema present.     Comments: Trace edema bilaterally  Skin:    General: Skin is warm and dry.  Neurological:     Mental Status: She is alert and oriented to person, place, and time.  Psychiatric:        Judgment: Judgment normal.     BP 126/60 (BP Location: Right Arm, Patient Position: Sitting, Cuff Size: Large)   Pulse 97   Temp 97.9 F (36.6 C) (Oral)   Resp 16   Ht '5\' 4"'$  (1.626 m)   Wt 266 lb (120.7 kg)   SpO2 98%   BMI 45.66 kg/m  Wt Readings from Last 3  Encounters:  01/14/23 266 lb (120.7 kg)  11/06/22 260 lb (117.9 kg)  10/28/22 260 lb (117.9 kg)       Assessment & Plan:   Problem List Items Addressed This Visit       Unprioritized   Preoperative evaluation to rule out surgical contraindication    EKG tracing is personally reviewed.  EKG notes NSR.  No acute changes.  Obtain labs as ordered and chest x-ray.       Relevant Orders   Protime-INR   APTT   Urine Culture   DG Chest 2 View   EKG 12-Lead (Completed)   Hypothyroidism    Lab Results  Component Value Date   TSH 3.02 11/06/2022  TSH WNL, not on synthroid.       Hyperlipidemia    Last lipid panel stable.  Lab Results  Component Value Date   CHOL 158 10/28/2022   HDL 68.10 10/28/2022   LDLCALC 75 10/28/2022   LDLDIRECT 111.0 07/20/2019   TRIG 75.0 10/28/2022   CHOLHDL 2 10/28/2022        History of tobacco abuse    Remains tobacco free.       Gout    Gout has been quiet on allopurinol. Continue same.       GERD (gastroesophageal reflux disease)    Stable on pantoprazole.  No longer taking pepcid.       Family history of rheumatoid arthritis  Relevant Orders   Rheumatoid Factor   Essential hypertension    BP Readings from Last 3 Encounters:  01/14/23 126/60  11/06/22 128/72  10/28/22 135/85  BP at goal on lisinopril '10mg'$  and dyazide. Continue same.       Relevant Orders   Comp Met (CMET)   Diabetes type 2, controlled ([place])    Lab Results  Component Value Date   HGBA1C 6.4 10/28/2022   HGBA1C 6.5 07/23/2022   HGBA1C 6.9 (H) 03/08/2022   Lab Results  Component Value Date   MICROALBUR 15.3 (H) 10/28/2022   LDLCALC 75 10/28/2022   CREATININE 0.95 11/06/2022  Stable A1C, continue diabetic diet.       Depression - Primary    Stable on cymbalta.       Other Visit Diagnoses     Elevated hemoglobin ([place])       Relevant Orders   CBC w/Diff      No orders of the defined types were placed in this encounter.  I, Nance Pear, NP, personally preformed the services described in this documentation.  All medical record entries made by the scribe were at my direction and in my presence.  I have reviewed the chart and discharge instructions (if applicable) and agree that the record reflects my personal performance and is accurate and complete. 01/14/2023  I,Rachel Rivera,acting as a scribe for Nance Pear, NP.,have documented all relevant documentation on the behalf of Nance Pear, NP,as directed by  Nance Pear, NP while in the presence of Nance Pear, NP.  Nance Pear, NP

## 2023-01-14 ENCOUNTER — Telehealth: Payer: Self-pay | Admitting: Family

## 2023-01-14 ENCOUNTER — Ambulatory Visit (INDEPENDENT_AMBULATORY_CARE_PROVIDER_SITE_OTHER): Payer: PPO | Admitting: Family

## 2023-01-14 ENCOUNTER — Ambulatory Visit (HOSPITAL_BASED_OUTPATIENT_CLINIC_OR_DEPARTMENT_OTHER)
Admission: RE | Admit: 2023-01-14 | Discharge: 2023-01-14 | Disposition: A | Discharge status: Home / Self Care | Payer: PPO | Source: Ambulatory Visit | Attending: Neurological Surgery | Admitting: Neurological Surgery

## 2023-01-14 ENCOUNTER — Ambulatory Visit (HOSPITAL_BASED_OUTPATIENT_CLINIC_OR_DEPARTMENT_OTHER)
Admission: RE | Admit: 2023-01-14 | Discharge: 2023-01-14 | Disposition: A | Discharge status: Home / Self Care | Payer: PPO | Source: Ambulatory Visit | Attending: Family | Admitting: Family

## 2023-01-14 ENCOUNTER — Telehealth: Payer: Self-pay

## 2023-01-14 VITALS — BP 126/60 | HR 97 | Temp 97.9°F | Resp 16 | Ht 64.0 in | Wt 266.0 lb

## 2023-01-14 DIAGNOSIS — D582 Other hemoglobinopathies: Secondary | ICD-10-CM

## 2023-01-14 DIAGNOSIS — Z13820 Encounter for screening for osteoporosis: Secondary | ICD-10-CM | POA: Insufficient documentation

## 2023-01-14 DIAGNOSIS — M109 Gout, unspecified: Secondary | ICD-10-CM | POA: Diagnosis not present

## 2023-01-14 DIAGNOSIS — Z87891 Personal history of nicotine dependence: Secondary | ICD-10-CM | POA: Insufficient documentation

## 2023-01-14 DIAGNOSIS — Z96641 Presence of right artificial hip joint: Secondary | ICD-10-CM | POA: Diagnosis not present

## 2023-01-14 DIAGNOSIS — Z780 Asymptomatic menopausal state: Secondary | ICD-10-CM | POA: Insufficient documentation

## 2023-01-14 DIAGNOSIS — Z01818 Encounter for other preprocedural examination: Secondary | ICD-10-CM | POA: Diagnosis not present

## 2023-01-14 DIAGNOSIS — E785 Hyperlipidemia, unspecified: Secondary | ICD-10-CM | POA: Diagnosis not present

## 2023-01-14 DIAGNOSIS — Z8781 Personal history of (healed) traumatic fracture: Secondary | ICD-10-CM | POA: Diagnosis not present

## 2023-01-14 DIAGNOSIS — Z8261 Family history of arthritis: Secondary | ICD-10-CM

## 2023-01-14 DIAGNOSIS — K219 Gastro-esophageal reflux disease without esophagitis: Secondary | ICD-10-CM | POA: Diagnosis not present

## 2023-01-14 DIAGNOSIS — F320 Major depressive disorder, single episode, mild: Secondary | ICD-10-CM | POA: Diagnosis not present

## 2023-01-14 DIAGNOSIS — E039 Hypothyroidism, unspecified: Secondary | ICD-10-CM | POA: Diagnosis not present

## 2023-01-14 DIAGNOSIS — E119 Type 2 diabetes mellitus without complications: Secondary | ICD-10-CM

## 2023-01-14 DIAGNOSIS — I10 Essential (primary) hypertension: Secondary | ICD-10-CM

## 2023-01-14 DIAGNOSIS — M4316 Spondylolisthesis, lumbar region: Secondary | ICD-10-CM | POA: Insufficient documentation

## 2023-01-14 DIAGNOSIS — J9811 Atelectasis: Secondary | ICD-10-CM | POA: Diagnosis not present

## 2023-01-14 LAB — CBC WITH DIFFERENTIAL/PLATELET
Basophils Absolute: 0.1 10*3/uL (ref 0.0–0.1)
Basophils Relative: 1.1 % (ref 0.0–3.0)
Eosinophils Absolute: 0.1 10*3/uL (ref 0.0–0.7)
Eosinophils Relative: 1.7 % (ref 0.0–5.0)
HCT: 40.8 % (ref 36.0–46.0)
Hemoglobin: 13.4 g/dL (ref 12.0–15.0)
Lymphocytes Relative: 35.2 % (ref 12.0–46.0)
Lymphs Abs: 2.4 10*3/uL (ref 0.7–4.0)
MCHC: 33 g/dL (ref 30.0–36.0)
MCV: 97.4 fl (ref 78.0–100.0)
Monocytes Absolute: 0.4 10*3/uL (ref 0.1–1.0)
Monocytes Relative: 6.1 % (ref 3.0–12.0)
Neutro Abs: 3.7 10*3/uL (ref 1.4–7.7)
Neutrophils Relative %: 55.9 % (ref 43.0–77.0)
Platelets: 229 10*3/uL (ref 150.0–400.0)
RBC: 4.19 Mil/uL (ref 3.87–5.11)
RDW: 15.1 % (ref 11.5–15.5)
WBC: 6.7 10*3/uL (ref 4.0–10.5)

## 2023-01-14 LAB — COMPREHENSIVE METABOLIC PANEL
ALT: 27 U/L (ref 0–35)
AST: 18 U/L (ref 0–37)
Albumin: 4.4 g/dL (ref 3.5–5.2)
Alkaline Phosphatase: 98 U/L (ref 39–117)
BUN: 33 mg/dL — ABNORMAL HIGH (ref 6–23)
CO2: 22 mEq/L (ref 19–32)
Calcium: 9.7 mg/dL (ref 8.4–10.5)
Chloride: 109 mEq/L (ref 96–112)
Creatinine, Ser: 1.28 mg/dL — ABNORMAL HIGH (ref 0.40–1.20)
GFR: 41.91 mL/min — ABNORMAL LOW (ref >60.00)
Glucose, Bld: 109 mg/dL — ABNORMAL HIGH (ref 70–99)
Potassium: 4.7 mEq/L (ref 3.5–5.1)
Sodium: 140 mEq/L (ref 135–145)
Total Bilirubin: 0.4 mg/dL (ref 0.2–1.2)
Total Protein: 6.8 g/dL (ref 6.0–8.3)

## 2023-01-14 NOTE — Assessment & Plan Note (Signed)
Stable on pantoprazole.  No longer taking pepcid.

## 2023-01-14 NOTE — Telephone Encounter (Signed)
Left voicemail for patient to return call to office to schedule a lab appointment.

## 2023-01-14 NOTE — Assessment & Plan Note (Signed)
Stable on cymbalta.

## 2023-01-14 NOTE — Assessment & Plan Note (Signed)
Lab Results  Component Value Date   HGBA1C 6.4 10/28/2022   HGBA1C 6.5 07/23/2022   HGBA1C 6.9 (H) 03/08/2022   Lab Results  Component Value Date   MICROALBUR 15.3 (H) 10/28/2022   LDLCALC 75 10/28/2022   CREATININE 0.95 11/06/2022   Stable A1C, continue diabetic diet.

## 2023-01-14 NOTE — Telephone Encounter (Signed)
Opened in error

## 2023-01-14 NOTE — Assessment & Plan Note (Signed)
EKG tracing is personally reviewed.  EKG notes NSR.  No acute changes.  Obtain labs as ordered and chest x-ray.

## 2023-01-14 NOTE — Assessment & Plan Note (Signed)
Gout has been quiet on allopurinol. Continue same.

## 2023-01-14 NOTE — Assessment & Plan Note (Signed)
Lab Results  Component Value Date   TSH 3.02 11/06/2022   TSH WNL, not on synthroid.

## 2023-01-14 NOTE — Assessment & Plan Note (Signed)
Last lipid panel stable.  Lab Results  Component Value Date   CHOL 158 10/28/2022   HDL 68.10 10/28/2022   LDLCALC 75 10/28/2022   LDLDIRECT 111.0 07/20/2019   TRIG 75.0 10/28/2022   CHOLHDL 2 10/28/2022

## 2023-01-14 NOTE — Addendum Note (Signed)
Addended by: Manuela Schwartz on: 01/14/2023 04:42 PM   Modules accepted: Orders

## 2023-01-14 NOTE — Assessment & Plan Note (Signed)
Remains tobacco free.   

## 2023-01-14 NOTE — Assessment & Plan Note (Signed)
BP Readings from Last 3 Encounters:  01/14/23 126/60  11/06/22 128/72  10/28/22 135/85   BP at goal on lisinopril '10mg'$  and dyazide. Continue same.

## 2023-01-14 NOTE — Telephone Encounter (Signed)
Please call Dr. Tomasita Crumble office to request surgical clearance form with planned procedure details.

## 2023-01-14 NOTE — Assessment & Plan Note (Signed)
Reports she was given good report by her eye doctor.

## 2023-01-14 NOTE — Patient Instructions (Signed)
Please complete lab work prior to leaving. Complete x-ray on the first floor.  

## 2023-01-14 NOTE — Telephone Encounter (Signed)
Lvm for Janett Billow, surgery scheduler. Fax number provided for her to fax medical surgical clearance form.

## 2023-01-15 LAB — URINE CULTURE
MICRO NUMBER:: 14460714
SPECIMEN QUALITY:: ADEQUATE

## 2023-01-15 LAB — RHEUMATOID FACTOR: Rheumatoid fact SerPl-aCnc: <14 IU/mL (ref <14)

## 2023-01-15 NOTE — Telephone Encounter (Signed)
Spoke to [place] and she will fax forms today

## 2023-01-16 ENCOUNTER — Other Ambulatory Visit (INDEPENDENT_AMBULATORY_CARE_PROVIDER_SITE_OTHER): Payer: PPO

## 2023-01-16 DIAGNOSIS — Z01818 Encounter for other preprocedural examination: Secondary | ICD-10-CM

## 2023-01-16 LAB — APTT: aPTT: 31.4 s (ref 25.4–36.8)

## 2023-01-16 LAB — PROTIME-INR
INR: 1 ratio (ref 0.8–1.0)
Prothrombin Time: 11.2 s (ref 9.6–13.1)

## 2023-01-20 NOTE — Telephone Encounter (Signed)
Have you seen these forms come through yet?

## 2023-01-20 NOTE — Telephone Encounter (Signed)
Form retrieve from fax.

## 2023-01-22 ENCOUNTER — Telehealth: Payer: Self-pay | Admitting: Family

## 2023-01-22 NOTE — Telephone Encounter (Signed)
Clearance received will be completed this week

## 2023-01-22 NOTE — Telephone Encounter (Signed)
Can you please request surgical clearance form from Dr. Clarice Pole office?

## 2023-01-23 ENCOUNTER — Other Ambulatory Visit (HOSPITAL_BASED_OUTPATIENT_CLINIC_OR_DEPARTMENT_OTHER): Payer: Self-pay | Admitting: Neurological Surgery

## 2023-01-23 DIAGNOSIS — M4316 Spondylolisthesis, lumbar region: Secondary | ICD-10-CM

## 2023-01-28 ENCOUNTER — Ambulatory Visit: Payer: PPO | Admitting: Family

## 2023-01-29 ENCOUNTER — Ambulatory Visit (HOSPITAL_BASED_OUTPATIENT_CLINIC_OR_DEPARTMENT_OTHER)
Admission: RE | Admit: 2023-01-29 | Discharge: 2023-01-29 | Disposition: A | Discharge status: Home / Self Care | Payer: PPO | Source: Ambulatory Visit | Attending: Neurological Surgery | Admitting: Neurological Surgery

## 2023-01-29 DIAGNOSIS — M4316 Spondylolisthesis, lumbar region: Secondary | ICD-10-CM | POA: Insufficient documentation

## 2023-01-29 DIAGNOSIS — M5126 Other intervertebral disc displacement, lumbar region: Secondary | ICD-10-CM | POA: Diagnosis not present

## 2023-01-29 DIAGNOSIS — M48061 Spinal stenosis, lumbar region without neurogenic claudication: Secondary | ICD-10-CM | POA: Diagnosis not present

## 2023-01-29 DIAGNOSIS — M47817 Spondylosis without myelopathy or radiculopathy, lumbosacral region: Secondary | ICD-10-CM | POA: Diagnosis not present

## 2023-02-04 ENCOUNTER — Other Ambulatory Visit: Payer: Self-pay | Admitting: Neurological Surgery

## 2023-02-07 ENCOUNTER — Ambulatory Visit: Payer: PPO | Admitting: Family

## 2023-02-19 NOTE — Pre-Procedure Instructions (Signed)
Surgical Instructions    Your procedure is scheduled on Tuesday, March 5.  Report to [facility] Main Entrance "A" at 5:30 A.M., then check in with the Admitting office.  Call this number if you have problems the morning of surgery:  [phone]   If you have any questions prior to your surgery date call [phone]: Open Monday-Friday 8am-4pm If you experience any cold or flu symptoms such as cough, fever, chills, shortness of breath, etc. between now and your scheduled surgery, please notify us at the above number     Remember:  Do not eat or drink after midnight the night before your surgery    Take these medicines the morning of surgery with A SIP OF WATER:  allopurinol (ZYLOPRIM)  DULoxetine (CYMBALTA)  pantoprazole (PROTONIX)    As of today, STOP taking any Aspirin (unless otherwise instructed by your surgeon), diclofenac (VOLTAREN), Aleve, Naproxen, Ibuprofen, Motrin, Advil, Goody's, BC's, all herbal medications, fish oil, and all vitamins.            [facility] is not responsible for any belongings or valuables.    Do NOT Smoke (Tobacco/Vaping)  24 hours prior to your procedure  If you use a CPAP at night, you may bring your mask for your overnight stay.   Contacts, glasses, hearing aids, dentures or partials may not be worn into surgery, please bring cases for these belongings   For patients admitted to the hospital, discharge time will be determined by your treatment team.   Patients discharged the day of surgery will not be allowed to drive home, and someone needs to stay with them for 24 hours.   SURGICAL WAITING ROOM VISITATION Patients having surgery or a procedure may have no more than 2 support people in the waiting area - these visitors may rotate.   Children under the age of 57 must have an adult with them who is not the patient. If the patient needs to stay at the hospital during part of their recovery, the visitor guidelines for inpatient rooms  apply. Pre-op nurse will coordinate an appropriate time for 1 support person to accompany patient in pre-op.  This support person may not rotate.   Please refer to [email or web address] for the visitor guidelines for Inpatients (after your surgery is over and you are in a regular room).    Special instructions:    Oral Hygiene is also important to reduce your risk of infection.  Remember - BRUSH YOUR TEETH THE MORNING OF SURGERY WITH YOUR REGULAR TOOTHPASTE   - Preparing For Surgery  Before surgery, you can play an important role. Because skin is not sterile, your skin needs to be as free of germs as possible. You can reduce the number of germs on your skin by washing with CHG (chlorahexidine gluconate) Soap before surgery.  CHG is an antiseptic cleaner which kills germs and bonds with the skin to continue killing germs even after washing.     Please do not use if you have an allergy to CHG or antibacterial soaps. If your skin becomes reddened/irritated stop using the CHG.  Do not shave (including legs and underarms) for at least 48 hours prior to first CHG shower. It is OK to shave your face.  Please follow these instructions carefully.     Shower the NIGHT BEFORE SURGERY and the MORNING OF SURGERY with CHG Soap.   If you chose to wash your hair, wash your hair first as usual with your normal shampoo. After you  shampoo, rinse your hair and body thoroughly to remove the shampoo.  Then [employer] and genitals (private parts) with your normal soap and rinse thoroughly to remove soap.  After that Use CHG Soap as you would any other liquid soap. You can apply CHG directly to the skin and wash gently with a scrungie or a clean washcloth.   Apply the CHG Soap to your body ONLY FROM THE NECK DOWN.  Do not use on open wounds or open sores. Avoid contact with your eyes, ears, mouth and genitals (private parts). Wash Face and genitals  (private parts)  with your normal soap.   Wash thoroughly, paying special attention to the area where your surgery will be performed.  Thoroughly rinse your body with warm water from the neck down.  DO NOT shower/wash with your normal soap after using and rinsing off the CHG Soap.  Pat yourself dry with a CLEAN TOWEL.  Wear CLEAN PAJAMAS to bed the night before surgery  Place CLEAN SHEETS on your bed the night before your surgery  DO NOT SLEEP WITH PETS.   Day of Surgery:  Take a shower with CHG soap. Wear Clean/Comfortable clothing the morning of surgery Do not wear jewelry or makeup. Do not wear lotions, powders, perfumes/cologne or deodorant. Do not shave 48 hours prior to surgery.  Men may shave face and neck. Do not bring valuables to the hospital. Do not wear nail polish, gel polish, artificial nails, or any other type of covering on natural nails (fingers and toes) If you have artificial nails or gel coating that need to be removed by a nail salon, please have this removed prior to surgery. Artificial nails or gel coating may interfere with anesthesia's ability to adequately monitor your vital signs. Remember to brush your teeth WITH YOUR REGULAR TOOTHPASTE.    If you received a COVID test during your pre-op visit, it is requested that you wear a mask when out in public, stay away from anyone that may not be feeling well, and notify your surgeon if you develop symptoms. If you have been in contact with anyone that has tested positive in the last 10 days, please notify your surgeon.    Please read over the following fact sheets that you were given.

## 2023-02-20 ENCOUNTER — Encounter (HOSPITAL_COMMUNITY)
Admission: RE | Admit: 2023-02-20 | Discharge: 2023-02-20 | Disposition: A | Discharge status: Home / Self Care | Payer: PPO | Source: Ambulatory Visit | Attending: Neurological Surgery | Admitting: Neurological Surgery

## 2023-02-20 ENCOUNTER — Other Ambulatory Visit: Payer: Self-pay | Admitting: Neurological Surgery

## 2023-02-20 ENCOUNTER — Other Ambulatory Visit: Payer: Self-pay

## 2023-02-20 ENCOUNTER — Encounter (HOSPITAL_COMMUNITY): Payer: Self-pay

## 2023-02-20 VITALS — BP 125/75 | HR 56 | Temp 97.8°F | Resp 17 | Ht 64.0 in | Wt 262.6 lb

## 2023-02-20 DIAGNOSIS — I10 Essential (primary) hypertension: Secondary | ICD-10-CM | POA: Insufficient documentation

## 2023-02-20 DIAGNOSIS — Z01812 Encounter for preprocedural laboratory examination: Secondary | ICD-10-CM | POA: Diagnosis not present

## 2023-02-20 DIAGNOSIS — Z01818 Encounter for other preprocedural examination: Secondary | ICD-10-CM

## 2023-02-20 LAB — BASIC METABOLIC PANEL
Anion gap: 12 (ref 5–15)
BUN: 28 mg/dL — ABNORMAL HIGH (ref 8–23)
CO2: 19 mmol/L — ABNORMAL LOW (ref 22–32)
Calcium: 9.6 mg/dL (ref 8.9–10.3)
Chloride: 107 mmol/L (ref 98–111)
Creatinine, Ser: 1.27 mg/dL — ABNORMAL HIGH (ref 0.44–1.00)
GFR, Estimated: 45 mL/min — ABNORMAL LOW (ref >60)
Glucose, Bld: 117 mg/dL — ABNORMAL HIGH (ref 70–99)
Potassium: 4.5 mmol/L (ref 3.5–5.1)
Sodium: 138 mmol/L (ref 135–145)

## 2023-02-20 LAB — SURGICAL PCR SCREEN
MRSA, PCR: POSITIVE — AB
Staphylococcus aureus: POSITIVE — AB

## 2023-02-20 LAB — CBC
HCT: 40.7 % (ref 36.0–46.0)
Hemoglobin: 13.5 g/dL (ref 12.0–15.0)
MCH: 31.7 pg (ref 26.0–34.0)
MCHC: 33.2 g/dL (ref 30.0–36.0)
MCV: 95.5 fL (ref 80.0–100.0)
Platelets: 273 10*3/uL (ref 150–400)
RBC: 4.26 MIL/uL (ref 3.87–5.11)
RDW: 13.9 % (ref 11.5–15.5)
WBC: 8.8 10*3/uL (ref 4.0–10.5)
nRBC: 0 % (ref 0.0–0.2)

## 2023-02-20 LAB — TYPE AND SCREEN
ABO/RH(D): A POS
Antibody Screen: NEGATIVE

## 2023-02-20 NOTE — Progress Notes (Signed)
PCP - [name], NP Cardiologist - denies  PPM/ICD - denies   Chest x-ray - 01/14/23 EKG - 01/14/23 Stress Test - denies ECHO - denies Cardiac Cath - denies  Sleep Study - denies   DM- denies  Blood Thinner Instructions: n/a Aspirin Instructions: Hold 7 days. Pt took last dose 2/20  ERAS Protcol - no, NPO   COVID TEST- n/a   Anesthesia review: no  Patient denies shortness of breath, fever, cough and chest pain at PAT appointment   All instructions explained to the patient, with a verbal understanding of the material. Patient agrees to go over the instructions while at home for a better understanding. The opportunity to ask questions was provided.

## 2023-02-20 NOTE — Progress Notes (Signed)
Lexine Baton, RN, with Dr. Clarice Pole office made aware of pt's positive PCR results.

## 2023-02-24 NOTE — Anesthesia Preprocedure Evaluation (Signed)
Anesthesia Evaluation  Patient identified by MRN, date of birth, ID band Patient awake    Reviewed: Allergy & Precautions, NPO status , Patient's Chart, lab work & pertinent test results  Airway Mallampati: III  TM Distance: >3 FB Neck ROM: Full    Dental no notable dental hx.    Pulmonary former smoker   Pulmonary exam normal        Cardiovascular hypertension, Pt. on medications  Rhythm:Regular Rate:Normal     Neuro/Psych    Depression    negative neurological ROS     GI/Hepatic Neg liver ROS,GERD  Medicated,,  Endo/Other  diabetesHypothyroidism    Renal/GU   negative genitourinary   Musculoskeletal  (+) Arthritis , Osteoarthritis,    Abdominal Normal abdominal exam  (+)   Peds  Hematology negative hematology ROS (+)   Anesthesia Other Findings   Reproductive/Obstetrics                             Anesthesia Physical Anesthesia Plan  ASA: 3  Anesthesia Plan: General   Post-op Pain Management: Celebrex PO (pre-op)* and Tylenol PO (pre-op)*   Induction: Intravenous  PONV Risk Score and Plan: 3 and Ondansetron, Dexamethasone and Treatment may vary due to age or medical condition  Airway Management Planned: Mask and Oral ETT  Additional Equipment: None  Intra-op Plan:   Post-operative Plan: Extubation in OR  Informed Consent: I have reviewed the patients History and Physical, chart, labs and discussed the procedure including the risks, benefits and alternatives for the proposed anesthesia with the patient or authorized representative who has indicated his/her understanding and acceptance.     Dental advisory given  Plan Discussed with: CRNA  Anesthesia Plan Comments: (Lab Results      Component                Value               Date                      WBC                      8.8                 02/20/2023                HGB                      13.5                 02/20/2023                HCT                      40.7                02/20/2023                MCV                      95.5                02/20/2023                PLT  273                 02/20/2023             Lab Results      Component                Value               Date                      NA                       138                 02/20/2023                K                        4.5                 02/20/2023                CO2                      19 (L)              02/20/2023                GLUCOSE                  117 (H)             02/20/2023                BUN                      28 (H)              02/20/2023                CREATININE               1.27 (H)            02/20/2023                CALCIUM                  9.6                 02/20/2023                GFRNONAA                 45 (L)              02/20/2023           )       Anesthesia Quick Evaluation

## 2023-02-25 ENCOUNTER — Ambulatory Visit (HOSPITAL_BASED_OUTPATIENT_CLINIC_OR_DEPARTMENT_OTHER): Payer: PPO | Admitting: Anesthesiology

## 2023-02-25 ENCOUNTER — Encounter (HOSPITAL_COMMUNITY): Payer: Self-pay | Admitting: Neurological Surgery

## 2023-02-25 ENCOUNTER — Ambulatory Visit (HOSPITAL_COMMUNITY): Payer: PPO | Admitting: Anesthesiology

## 2023-02-25 ENCOUNTER — Ambulatory Visit (HOSPITAL_COMMUNITY): Payer: PPO

## 2023-02-25 ENCOUNTER — Encounter (HOSPITAL_COMMUNITY)
Admission: RE | Disposition: A | Discharge status: Home / Self Care | Payer: Self-pay | Source: Home / Self Care | Attending: Neurological Surgery

## 2023-02-25 ENCOUNTER — Other Ambulatory Visit: Payer: Self-pay

## 2023-02-25 ENCOUNTER — Observation Stay (HOSPITAL_COMMUNITY)
Admission: RE | Admit: 2023-02-25 | Discharge: 2023-02-26 | Disposition: A | Discharge status: Home / Self Care | Payer: PPO | Attending: Neurological Surgery | Admitting: Neurological Surgery

## 2023-02-25 DIAGNOSIS — M48062 Spinal stenosis, lumbar region with neurogenic claudication: Secondary | ICD-10-CM | POA: Insufficient documentation

## 2023-02-25 DIAGNOSIS — Z79899 Other long term (current) drug therapy: Secondary | ICD-10-CM | POA: Diagnosis not present

## 2023-02-25 DIAGNOSIS — M5416 Radiculopathy, lumbar region: Secondary | ICD-10-CM | POA: Diagnosis not present

## 2023-02-25 DIAGNOSIS — Z0389 Encounter for observation for other suspected diseases and conditions ruled out: Secondary | ICD-10-CM | POA: Diagnosis not present

## 2023-02-25 DIAGNOSIS — M4316 Spondylolisthesis, lumbar region: Principal | ICD-10-CM

## 2023-02-25 DIAGNOSIS — Z96642 Presence of left artificial hip joint: Secondary | ICD-10-CM | POA: Diagnosis not present

## 2023-02-25 DIAGNOSIS — Z7982 Long term (current) use of aspirin: Secondary | ICD-10-CM | POA: Insufficient documentation

## 2023-02-25 DIAGNOSIS — Z8616 Personal history of COVID-19: Secondary | ICD-10-CM | POA: Diagnosis not present

## 2023-02-25 DIAGNOSIS — M4726 Other spondylosis with radiculopathy, lumbar region: Secondary | ICD-10-CM

## 2023-02-25 DIAGNOSIS — Z87891 Personal history of nicotine dependence: Secondary | ICD-10-CM | POA: Diagnosis not present

## 2023-02-25 DIAGNOSIS — M4326 Fusion of spine, lumbar region: Secondary | ICD-10-CM | POA: Diagnosis not present

## 2023-02-25 DIAGNOSIS — M5116 Intervertebral disc disorders with radiculopathy, lumbar region: Secondary | ICD-10-CM | POA: Diagnosis not present

## 2023-02-25 DIAGNOSIS — D62 Acute posthemorrhagic anemia: Secondary | ICD-10-CM | POA: Insufficient documentation

## 2023-02-25 DIAGNOSIS — I10 Essential (primary) hypertension: Secondary | ICD-10-CM | POA: Insufficient documentation

## 2023-02-25 DIAGNOSIS — Z981 Arthrodesis status: Secondary | ICD-10-CM | POA: Diagnosis not present

## 2023-02-25 LAB — ABO/RH: ABO/RH(D): A POS

## 2023-02-25 SURGERY — POSTERIOR LUMBAR FUSION 1 LEVEL
Anesthesia: General | Site: Back

## 2023-02-25 MED ORDER — BISACODYL 10 MG RE SUPP: 10.0000 mg | Freq: Every day | RECTAL | Status: DC | PRN

## 2023-02-25 MED ORDER — FENTANYL CITRATE (PF) 100 MCG/2ML IJ SOLN
INTRAMUSCULAR | Status: AC
  Filled 2023-02-25: qty 2

## 2023-02-25 MED ORDER — LACTATED RINGERS IV SOLN: INTRAVENOUS | Status: DC

## 2023-02-25 MED ORDER — TRIAMTERENE-HCTZ 37.5-25 MG PO CAPS
1.0000 | ORAL_CAPSULE | Freq: Every day | ORAL | Status: DC
  Filled 2023-02-25: qty 1

## 2023-02-25 MED ORDER — SODIUM CHLORIDE 0.9% FLUSH: 3.0000 mL | INTRAVENOUS | Status: DC | PRN

## 2023-02-25 MED ORDER — METHOCARBAMOL 1000 MG/10ML IJ SOLN: 500.0000 mg | Freq: Four times a day (QID) | INTRAVENOUS | Status: DC | PRN

## 2023-02-25 MED ORDER — CHLORHEXIDINE GLUCONATE CLOTH 2 % EX PADS: 6.0000 | MEDICATED_PAD | Freq: Once | CUTANEOUS | Status: DC

## 2023-02-25 MED ORDER — SENNA 8.6 MG PO TABS
1.0000 | ORAL_TABLET | Freq: Two times a day (BID) | ORAL | Status: DC
  Administered 2023-02-25 (×2): 8.6 mg via ORAL
  Filled 2023-02-25 (×2): qty 1

## 2023-02-25 MED ORDER — MUPIROCIN 2 % EX OINT
1.0000 | TOPICAL_OINTMENT | Freq: Two times a day (BID) | CUTANEOUS | Status: DC
  Administered 2023-02-25: 1 via NASAL
  Filled 2023-02-25: qty 22

## 2023-02-25 MED ORDER — ACETAMINOPHEN 325 MG PO TABS: 650.0000 mg | ORAL_TABLET | ORAL | Status: DC | PRN

## 2023-02-25 MED ORDER — ALUM & MAG HYDROXIDE-SIMETH 200-200-20 MG/5ML PO SUSP: 30.0000 mL | Freq: Four times a day (QID) | ORAL | Status: DC | PRN

## 2023-02-25 MED ORDER — CELECOXIB 200 MG PO CAPS
200.0000 mg | ORAL_CAPSULE | Freq: Once | ORAL | Status: AC
  Administered 2023-02-25: 200 mg via ORAL
  Filled 2023-02-25: qty 1

## 2023-02-25 MED ORDER — LIDOCAINE 2% (20 MG/ML) 5 ML SYRINGE
INTRAMUSCULAR | Status: DC | PRN
  Administered 2023-02-25: 60 mg via INTRAVENOUS

## 2023-02-25 MED ORDER — PROPOFOL 10 MG/ML IV BOLUS
INTRAVENOUS | Status: DC | PRN
  Administered 2023-02-25: 120 mg via INTRAVENOUS

## 2023-02-25 MED ORDER — CHLORHEXIDINE GLUCONATE CLOTH 2 % EX PADS
6.0000 | MEDICATED_PAD | Freq: Every day | CUTANEOUS | Status: DC
  Administered 2023-02-25: 6 via TOPICAL

## 2023-02-25 MED ORDER — ONDANSETRON HCL 4 MG/2ML IJ SOLN
4.0000 mg | Freq: Four times a day (QID) | INTRAMUSCULAR | Status: DC | PRN
  Administered 2023-02-25: 4 mg via INTRAVENOUS
  Filled 2023-02-25: qty 2

## 2023-02-25 MED ORDER — PANTOPRAZOLE SODIUM 20 MG PO TBEC
20.0000 mg | DELAYED_RELEASE_TABLET | Freq: Every day | ORAL | Status: DC
  Administered 2023-02-25: 20 mg via ORAL
  Filled 2023-02-25: qty 1

## 2023-02-25 MED ORDER — LISINOPRIL 10 MG PO TABS
10.0000 mg | ORAL_TABLET | Freq: Every day | ORAL | Status: DC
  Administered 2023-02-25: 10 mg via ORAL
  Filled 2023-02-25: qty 1

## 2023-02-25 MED ORDER — ONDANSETRON HCL 4 MG PO TABS
4.0000 mg | ORAL_TABLET | Freq: Four times a day (QID) | ORAL | Status: DC | PRN
  Administered 2023-02-26: 4 mg via ORAL
  Filled 2023-02-25: qty 1

## 2023-02-25 MED ORDER — HYDROMORPHONE HCL 1 MG/ML IJ SOLN: 1.0000 mg | INTRAMUSCULAR | Status: DC | PRN

## 2023-02-25 MED ORDER — POLYETHYLENE GLYCOL 3350 17 G PO PACK: 17.0000 g | PACK | Freq: Every day | ORAL | Status: DC | PRN

## 2023-02-25 MED ORDER — ALLOPURINOL 100 MG PO TABS
200.0000 mg | ORAL_TABLET | Freq: Every day | ORAL | Status: DC
  Administered 2023-02-25: 200 mg via ORAL
  Filled 2023-02-25: qty 2

## 2023-02-25 MED ORDER — FUROSEMIDE 40 MG PO TABS: 40.0000 mg | ORAL_TABLET | Freq: Every day | ORAL | Status: DC | PRN

## 2023-02-25 MED ORDER — LIDOCAINE-EPINEPHRINE 1 %-1:100000 IJ SOLN
INTRAMUSCULAR | Status: DC | PRN
  Administered 2023-02-25: 5 mL

## 2023-02-25 MED ORDER — DOCUSATE SODIUM 100 MG PO CAPS
100.0000 mg | ORAL_CAPSULE | Freq: Two times a day (BID) | ORAL | Status: DC
  Administered 2023-02-25 (×2): 100 mg via ORAL
  Filled 2023-02-25 (×2): qty 1

## 2023-02-25 MED ORDER — MIDAZOLAM HCL 2 MG/2ML IJ SOLN
INTRAMUSCULAR | Status: AC
  Filled 2023-02-25: qty 2

## 2023-02-25 MED ORDER — SODIUM CHLORIDE 0.9% FLUSH
3.0000 mL | Freq: Two times a day (BID) | INTRAVENOUS | Status: DC
  Administered 2023-02-25: 3 mL via INTRAVENOUS

## 2023-02-25 MED ORDER — DULOXETINE HCL 30 MG PO CPEP
60.0000 mg | ORAL_CAPSULE | Freq: Every day | ORAL | Status: DC
  Administered 2023-02-25: 60 mg via ORAL
  Filled 2023-02-25: qty 2

## 2023-02-25 MED ORDER — THROMBIN 5000 UNITS EX SOLR
CUTANEOUS | Status: AC
  Filled 2023-02-25: qty 5000

## 2023-02-25 MED ORDER — MENTHOL 3 MG MT LOZG: 1.0000 | LOZENGE | OROMUCOSAL | Status: DC | PRN

## 2023-02-25 MED ORDER — ORAL CARE MOUTH RINSE: 15.0000 mL | Freq: Once | OROMUCOSAL | Status: AC

## 2023-02-25 MED ORDER — BUPIVACAINE HCL (PF) 0.5 % IJ SOLN
INTRAMUSCULAR | Status: AC
  Filled 2023-02-25: qty 30

## 2023-02-25 MED ORDER — VANCOMYCIN HCL IN DEXTROSE 1-5 GM/200ML-% IV SOLN
1000.0000 mg | Freq: Once | INTRAVENOUS | Status: AC
  Administered 2023-02-25: 1000 mg via INTRAVENOUS

## 2023-02-25 MED ORDER — THROMBIN 5000 UNITS EX SOLR: OROMUCOSAL | Status: DC | PRN

## 2023-02-25 MED ORDER — PHENYLEPHRINE HCL-NACL 20-0.9 MG/250ML-% IV SOLN
INTRAVENOUS | Status: DC | PRN
  Administered 2023-02-25: 10 ug/min via INTRAVENOUS

## 2023-02-25 MED ORDER — CHLORHEXIDINE GLUCONATE 0.12 % MT SOLN
15.0000 mL | Freq: Once | OROMUCOSAL | Status: AC
  Administered 2023-02-25: 15 mL via OROMUCOSAL
  Filled 2023-02-25: qty 15

## 2023-02-25 MED ORDER — SODIUM CHLORIDE 0.9 % IV SOLN: 250.0000 mL | INTRAVENOUS | Status: DC

## 2023-02-25 MED ORDER — DIPHENHYDRAMINE HCL 25 MG PO TABS: 100.0000 mg | ORAL_TABLET | Freq: Every evening | ORAL | Status: DC | PRN

## 2023-02-25 MED ORDER — ROCURONIUM BROMIDE 10 MG/ML (PF) SYRINGE
PREFILLED_SYRINGE | INTRAVENOUS | Status: DC | PRN
  Administered 2023-02-25: 60 mg via INTRAVENOUS
  Administered 2023-02-25: 20 mg via INTRAVENOUS
  Administered 2023-02-25: 40 mg via INTRAVENOUS

## 2023-02-25 MED ORDER — ACETAMINOPHEN 500 MG PO TABS
1000.0000 mg | ORAL_TABLET | Freq: Once | ORAL | Status: AC
  Administered 2023-02-25: 1000 mg via ORAL
  Filled 2023-02-25: qty 2

## 2023-02-25 MED ORDER — FENTANYL CITRATE (PF) 250 MCG/5ML IJ SOLN
INTRAMUSCULAR | Status: AC
  Filled 2023-02-25: qty 5

## 2023-02-25 MED ORDER — SUGAMMADEX SODIUM 200 MG/2ML IV SOLN
INTRAVENOUS | Status: DC | PRN
  Administered 2023-02-25: 200 mg via INTRAVENOUS

## 2023-02-25 MED ORDER — DEXAMETHASONE SODIUM PHOSPHATE 10 MG/ML IJ SOLN
INTRAMUSCULAR | Status: DC | PRN
  Administered 2023-02-25: 10 mg via INTRAVENOUS

## 2023-02-25 MED ORDER — ONDANSETRON HCL 4 MG/2ML IJ SOLN
INTRAMUSCULAR | Status: DC | PRN
  Administered 2023-02-25: 4 mg via INTRAVENOUS

## 2023-02-25 MED ORDER — FLEET ENEMA 7-19 GM/118ML RE ENEM: 1.0000 | ENEMA | Freq: Once | RECTAL | Status: DC | PRN

## 2023-02-25 MED ORDER — FENTANYL CITRATE (PF) 250 MCG/5ML IJ SOLN
INTRAMUSCULAR | Status: DC | PRN
  Administered 2023-02-25 (×5): 50 ug via INTRAVENOUS

## 2023-02-25 MED ORDER — OXYCODONE-ACETAMINOPHEN 5-325 MG PO TABS
1.0000 | ORAL_TABLET | ORAL | Status: DC | PRN
  Administered 2023-02-25 – 2023-02-26 (×4): 2 via ORAL
  Filled 2023-02-25 (×3): qty 2

## 2023-02-25 MED ORDER — CEFAZOLIN SODIUM-DEXTROSE 2-4 GM/100ML-% IV SOLN
2.0000 g | Freq: Three times a day (TID) | INTRAVENOUS | Status: AC
  Administered 2023-02-25 (×2): 2 g via INTRAVENOUS
  Filled 2023-02-25 (×2): qty 100

## 2023-02-25 MED ORDER — PHENOL 1.4 % MT LIQD: 1.0000 | OROMUCOSAL | Status: DC | PRN

## 2023-02-25 MED ORDER — MIDAZOLAM HCL 5 MG/5ML IJ SOLN
INTRAMUSCULAR | Status: DC | PRN
  Administered 2023-02-25: 2 mg via INTRAVENOUS

## 2023-02-25 MED ORDER — FENTANYL CITRATE (PF) 100 MCG/2ML IJ SOLN
25.0000 ug | INTRAMUSCULAR | Status: DC | PRN
  Administered 2023-02-25 (×2): 25 ug via INTRAVENOUS

## 2023-02-25 MED ORDER — BUPIVACAINE HCL (PF) 0.5 % IJ SOLN
INTRAMUSCULAR | Status: DC | PRN
  Administered 2023-02-25: 5 mL
  Administered 2023-02-25: 25 mL

## 2023-02-25 MED ORDER — VANCOMYCIN HCL IN DEXTROSE 1-5 GM/200ML-% IV SOLN
INTRAVENOUS | Status: AC
  Filled 2023-02-25: qty 200

## 2023-02-25 MED ORDER — PROPOFOL 10 MG/ML IV BOLUS
INTRAVENOUS | Status: AC
  Filled 2023-02-25: qty 20

## 2023-02-25 MED ORDER — OXYCODONE-ACETAMINOPHEN 5-325 MG PO TABS
ORAL_TABLET | ORAL | Status: AC
  Filled 2023-02-25: qty 2

## 2023-02-25 MED ORDER — METHOCARBAMOL 500 MG PO TABS
500.0000 mg | ORAL_TABLET | Freq: Four times a day (QID) | ORAL | Status: DC | PRN
  Administered 2023-02-25 (×2): 500 mg via ORAL
  Filled 2023-02-25 (×2): qty 1

## 2023-02-25 MED ORDER — LIDOCAINE-EPINEPHRINE 1 %-1:100000 IJ SOLN
INTRAMUSCULAR | Status: AC
  Filled 2023-02-25: qty 1

## 2023-02-25 MED ORDER — 0.9 % SODIUM CHLORIDE (POUR BTL) OPTIME
TOPICAL | Status: DC | PRN
  Administered 2023-02-25: 1000 mL

## 2023-02-25 MED ORDER — CEFAZOLIN SODIUM-DEXTROSE 2-4 GM/100ML-% IV SOLN
2.0000 g | INTRAVENOUS | Status: AC
  Administered 2023-02-25: 2 g via INTRAVENOUS
  Filled 2023-02-25: qty 100

## 2023-02-25 MED ORDER — ACETAMINOPHEN 650 MG RE SUPP: 650.0000 mg | RECTAL | Status: DC | PRN

## 2023-02-25 SURGICAL SUPPLY — 69 items
ADH SKN CLS APL DERMABOND .7 (GAUZE/BANDAGES/DRESSINGS) ×1
APL SRG 60D 8 XTD TIP BNDBL (TIP)
BAG COUNTER SPONGE SURGICOUNT (BAG) ×1 IMPLANT
BAG SPNG CNTER NS LX DISP (BAG) ×1
BASKET BONE COLLECTION (BASKET) ×1 IMPLANT
BLADE BONE MILL MEDIUM (MISCELLANEOUS) ×1 IMPLANT
BLADE CLIPPER SURG (BLADE) IMPLANT
BONE CANC CHIPS 20CC PCAN1/4 (Bone Implant) ×1 IMPLANT
BUR MATCHSTICK NEURO 3.0 LAGG (BURR) ×1 IMPLANT
CAGE COROENT LG 10X9X23-12 (Cage) IMPLANT
CANISTER SUCT 3000ML PPV (MISCELLANEOUS) ×1 IMPLANT
CHIPS CANC BONE 20CC PCAN1/4 (Bone Implant) ×1 IMPLANT
CNTNR URN SCR LID CUP LEK RST (MISCELLANEOUS) ×1 IMPLANT
CONT SPEC 4OZ STRL OR WHT (MISCELLANEOUS) ×1
COVER BACK TABLE 60X90IN (DRAPES) ×1 IMPLANT
DERMABOND ADVANCED .7 DNX12 (GAUZE/BANDAGES/DRESSINGS) ×1 IMPLANT
DEVICE DISSECT PLASMABLAD 3.0S (MISCELLANEOUS) ×1 IMPLANT
DEVICE DSSCT PLSMBLD 3.0S LGHT (MISCELLANEOUS) IMPLANT
DRAPE C-ARM 42X72 X-RAY (DRAPES) ×2 IMPLANT
DRAPE C-ARMOR (DRAPES) IMPLANT
DRAPE HALF SHEET 40X57 (DRAPES) IMPLANT
DRAPE LAPAROTOMY 100X72X124 (DRAPES) ×1 IMPLANT
DRSG OPSITE POSTOP 4X8 (GAUZE/BANDAGES/DRESSINGS) IMPLANT
DURAPREP 26ML APPLICATOR (WOUND CARE) ×1 IMPLANT
DURASEAL APPLICATOR TIP (TIP) IMPLANT
DURASEAL SPINE SEALANT 3ML (MISCELLANEOUS) IMPLANT
ELECT REM PT RETURN 9FT ADLT (ELECTROSURGICAL) ×1
ELECTRODE REM PT RTRN 9FT ADLT (ELECTROSURGICAL) ×1 IMPLANT
GAUZE 4X4 16PLY ~~LOC~~+RFID DBL (SPONGE) IMPLANT
GAUZE SPONGE 4X4 12PLY STRL (GAUZE/BANDAGES/DRESSINGS) ×1 IMPLANT
GLOVE BIOGEL PI IND STRL 8.5 (GLOVE) ×2 IMPLANT
GLOVE ECLIPSE 8.5 STRL (GLOVE) ×2 IMPLANT
GOWN STRL REUS W/ TWL LRG LVL3 (GOWN DISPOSABLE) IMPLANT
GOWN STRL REUS W/ TWL XL LVL3 (GOWN DISPOSABLE) IMPLANT
GOWN STRL REUS W/TWL 2XL LVL3 (GOWN DISPOSABLE) ×2 IMPLANT
GOWN STRL REUS W/TWL LRG LVL3 (GOWN DISPOSABLE)
GOWN STRL REUS W/TWL XL LVL3 (GOWN DISPOSABLE) ×2
GRAFT BNE CANC CHIPS 1-8 20CC (Bone Implant) IMPLANT
GRAFT BONE PROTEIOS LRG 5CC (Orthopedic Implant) IMPLANT
HEMOSTAT POWDER KIT SURGIFOAM (HEMOSTASIS) ×1 IMPLANT
KIT BASIN OR (CUSTOM PROCEDURE TRAY) ×1 IMPLANT
KIT GRAFTMAG DEL NEURO DISP (NEUROSURGERY SUPPLIES) IMPLANT
KIT TURNOVER KIT B (KITS) ×1 IMPLANT
MILL BONE PREP (MISCELLANEOUS) ×1 IMPLANT
NEEDLE HYPO 22GX1.5 SAFETY (NEEDLE) ×1 IMPLANT
NS IRRIG 1000ML POUR BTL (IV SOLUTION) ×1 IMPLANT
PACK LAMINECTOMY NEURO (CUSTOM PROCEDURE TRAY) ×1 IMPLANT
PAD ARMBOARD 7.5X6 YLW CONV (MISCELLANEOUS) ×3 IMPLANT
PATTIES SURGICAL .5 X1 (DISPOSABLE) ×1 IMPLANT
PLASMABLADE 3.0S (MISCELLANEOUS) ×1
PLASMABLADE 3.0S W/LIGHT (MISCELLANEOUS)
ROD RELINE-O LORD 5.5X35MM (Rod) IMPLANT
SCREW LOCK RELINE 5.5 TULIP (Screw) IMPLANT
SCREW RELINE-O POLY 6.5X45 (Screw) IMPLANT
SPIKE FLUID TRANSFER (MISCELLANEOUS) ×1 IMPLANT
SPONGE SURGIFOAM ABS GEL 100 (HEMOSTASIS) IMPLANT
SPONGE T-LAP 4X18 ~~LOC~~+RFID (SPONGE) IMPLANT
SUT PROLENE 6 0 BV (SUTURE) IMPLANT
SUT VIC AB 1 CT1 18XBRD ANBCTR (SUTURE) ×1 IMPLANT
SUT VIC AB 1 CT1 8-18 (SUTURE) ×1
SUT VIC AB 2-0 CP2 18 (SUTURE) ×1 IMPLANT
SUT VIC AB 3-0 SH 8-18 (SUTURE) ×1 IMPLANT
SUT VIC AB 4-0 RB1 18 (SUTURE) ×1 IMPLANT
SYR 3ML LL SCALE MARK (SYRINGE) ×4 IMPLANT
SYR 5ML LL (SYRINGE) IMPLANT
TOWEL GREEN STERILE (TOWEL DISPOSABLE) ×1 IMPLANT
TOWEL GREEN STERILE FF (TOWEL DISPOSABLE) ×1 IMPLANT
TRAY FOLEY MTR SLVR 16FR STAT (SET/KITS/TRAYS/PACK) ×1 IMPLANT
WATER STERILE IRR 1000ML POUR (IV SOLUTION) ×1 IMPLANT

## 2023-02-25 NOTE — H&P (Signed)
Jordan [name] is an 72 y.o. female.   Chief Complaint: Bilateral leg pain and weakness HPI: Patient is a 72 year old individual who has had a spondylolisthesis at L4-L5 that has been treated conservatively for years.  Her level of function has deteriorated to the point where she has difficulty walking even short distances.  She has great pain nearly all the time.  The only comfortable position is recumbency.  I have advised surgical decompression arthrodesis at the level of L4-L5.  Past Medical History:  Diagnosis Date   Acute hypoxemic respiratory failure due to COVID-19 ([place]) 10/08/2020   Cellulitis and abscess of finger, unspecified 401.9   Cellulitis and abscess of foot, except toes    Depressive disorder, not elsewhere classified    GERD (gastroesophageal reflux disease)    Gout, unspecified    History of tobacco abuse 12/01/2018   Hyperglycemia 05/08/2011   Hyperlipidemia    borderline- not on meds   Hypertension    Insomnia    Leg edema    chronic   Obesity, unspecified    Osteoarthritis    Renal insufficiency, mild 05/08/2011   SBO (small bowel obstruction) ([place]) 12/01/2018   Small bowel obstruction ([place])     Past Surgical History:  Procedure Laterality Date   ABDOMINAL HYSTERECTOMY  2002   infection- no history of cancer   APPENDECTOMY  2002   catacact Bilateral 11/01/2019   CHOLECYSTECTOMY  1977   ECTOPIC PREGNANCY SURGERY  1971   1 tube removed   right shoulder rotator cuff repair  06/07/2010   TOTAL HIP ARTHROPLASTY Left 1998    Family History  Problem Relation Age of Onset   Diabetes Mother    Cancer Mother        colon/ pancreatic   Colon cancer Mother 57   Hypertension Father    Alcohol abuse Father    Cirrhosis Father    Other Sister        Pyeoderma gangrenosis   Diabetes Sister        type 2   Kidney failure Sister    Liver disease Sister    Cirrhosis Sister        had NASH, died heart failure   Heart attack Brother    Stomach cancer Maternal  Grandfather    Obesity Daughter    Rectal cancer Neg Hx    Esophageal cancer Neg Hx    Social History:  reports that she quit smoking about 2 years ago. Her smoking use included cigarettes. She smoked an average of .25 packs per day. She has never used smokeless tobacco. She reports that she does not currently use alcohol. She reports that she does not use drugs.  Allergies: No Known Allergies  Medications Prior to Admission  Medication Sig Dispense Refill   allopurinol (ZYLOPRIM) 100 MG tablet Take 2 tablets (200 mg total) by mouth daily. (Patient taking differently: Take 100 mg by mouth 2 (two) times daily.) 180 tablet 1   aspirin EC 81 MG tablet Take 81 mg by mouth daily.     Biotin (BIOTIN 5000) 5 MG CAPS Take 5 mg by mouth daily.     Cholecalciferol (D3 2000) 50 MCG (2000 UT) CAPS Take 2,000 Units by mouth daily.     Cyanocobalamin (B-12) 5000 MCG CAPS Take 5,000 mcg by mouth daily.     diclofenac (VOLTAREN) 75 MG EC tablet Take 1 tablet by mouth 2 times daily (Patient taking differently: Take 75 mg by mouth 2 (two) times daily  as needed for mild pain.) 60 tablet 2   diphenhydrAMINE (BENADRYL) 50 MG tablet Take 100-150 mg by mouth at bedtime as needed for sleep.     DULoxetine (CYMBALTA) 60 MG capsule Take 1 capsule (60 mg total) by mouth daily. 90 capsule 1   lisinopril (ZESTRIL) 10 MG tablet Take 1 tablet (10 mg total) by mouth daily. 90 tablet 1   Multiple Vitamin (MULTIVITAMIN WITH MINERALS) TABS tablet Take 1 tablet by mouth daily.     pantoprazole (PROTONIX) 40 MG tablet TAKE 1 TABLET BY MOUTH ONCE DAILY 90 tablet 1   triamterene-hydrochlorothiazide (DYAZIDE) 37.5-25 MG capsule Take 1 capsule by mouth daily. 90 capsule 1   COVID-19 mRNA vaccine 2023-2024 (COMIRNATY) syringe Inject into the muscle. 0.3 mL 0   furosemide (LASIX) 40 MG tablet TAKE 1 TABLET BY MOUTH ONCE DAILY AS NEEDED 90 tablet 1   influenza vaccine adjuvanted (FLUAD) 0.5 ML injection Inject into the muscle. 0.5 mL  0    Results for orders placed or performed during the hospital encounter of 02/25/23 (from the past 48 hour(s))  ABO/Rh     Status: None (Preliminary result)   Collection Time: 02/25/23  7:00 AM  Result Value Ref Range   ABO/RH(D) PENDING    No results found.  Review of Systems  Constitutional:  Positive for activity change.  Musculoskeletal:  Positive for back pain, gait problem and myalgias.  Neurological:  Positive for weakness and numbness.  All other systems reviewed and are negative.   Blood pressure 124/68, pulse 71, temperature 98.2 F (36.8 C), temperature source Oral, resp. rate 18, height '5\' 4"'$  (1.626 m), weight 118.8 kg, SpO2 94 %. Physical Exam Constitutional:      Appearance: Normal appearance. She is obese.  HENT:     Head: Normocephalic and atraumatic.     Right Ear: Tympanic membrane, ear canal and external ear normal.     Left Ear: Tympanic membrane, ear canal and external ear normal.     Nose: Nose normal.     Mouth/Throat:     Mouth: Mucous membranes are moist.     Pharynx: Oropharynx is clear.  Eyes:     Extraocular Movements: Extraocular movements intact.     Conjunctiva/sclera: Conjunctivae normal.     Pupils: Pupils are equal, round, and reactive to light.  Cardiovascular:     Rate and Rhythm: Normal rate and regular rhythm.     Pulses: Normal pulses.     Heart sounds: Normal heart sounds.  Pulmonary:     Effort: Pulmonary effort is normal.     Breath sounds: Normal breath sounds.  Abdominal:     General: Abdomen is flat. Bowel sounds are normal.     Palpations: Abdomen is soft.  Musculoskeletal:     Cervical back: Normal range of motion and neck supple.     Comments: Positive straight leg raising at 15 degrees Patrick's maneuver is negative bilaterally.  Skin:    Capillary Refill: Capillary refill takes less than 2 seconds.  Neurological:     General: No focal deficit present.     Mental Status: She is alert.     Comments: Mild weakness  in the tibialis anterior bilaterally.  Psychiatric:        Mood and Affect: Mood normal.        Behavior: Behavior normal.        Thought Content: Thought content normal.        Judgment: Judgment normal.  Assessment/Plan Spondylolisthesis L4-L5 with stenosis, neurogenic claudication, lumbar radiculopathy.  Plan: Decompression fusion L4-L5.  Earleen Newport, MD 02/25/2023, 7:40 AM

## 2023-02-25 NOTE — Anesthesia Procedure Notes (Signed)
Procedure Name: Intubation Date/Time: 02/25/2023 7:55 AM  Performed by: Wilburn Cornelia, CRNAPre-anesthesia Checklist: Patient identified, Emergency Drugs available, Suction available, Timeout performed and Patient being monitored Patient Re-evaluated:Patient Re-evaluated prior to induction Oxygen Delivery Method: Circle system utilized Preoxygenation: Pre-oxygenation with 100% oxygen Induction Type: IV induction Ventilation: Mask ventilation without difficulty Laryngoscope Size: Mac and 3 Grade View: Grade II Tube type: Oral Tube size: 7.0 mm Number of attempts: 1 Airway Equipment and Method: Stylet Placement Confirmation: ETT inserted through vocal cords under direct vision, positive ETCO2, CO2 detector and breath sounds checked- equal and bilateral Secured at: 21 cm Tube secured with: Tape Dental Injury: Teeth and Oropharynx as per pre-operative assessment

## 2023-02-25 NOTE — Op Note (Signed)
Date of surgery: 02/25/2023 Preoperative diagnosis: Spondylolisthesis L4-L5 with stenosis lumbar radiculopathy, lumbar neurogenic claudication. Postoperative diagnosis: Same Procedure: Bilateral laminotomies and decompression L4-L5 with more work than required for simple interbody technique.  Posterior lumbar interbody arthrodesis using peek spacers local autograft allograft and Prody os L4-L5.  Pedicle fixation L4-L5 with posterolateral arthrodesis using local autograft allograft and Prody os.  Surgeon: Ellene Route First Assistant: Kathyrn Sheriff MD Anesthesia: General endotracheal Indications: Jordan [name] is a 72 year old individual has had progressive worsening pain and weakness in her lower extremities she has a high-grade stenosis at the level of L4-L5.  Having failed multiple efforts at conservative management she was advised regarding the need for surgical decompression and stabilization at L4-L5.  Procedure: The patient was brought to the operating room supine on the stretcher.  After the smooth induction of general endotracheal anesthesia she was carefully placed prone on the operating table.  Because of the significant morbid obesity care was taken to appropriately pad and protect the ventral skin.  Then a midline incision was created at the level of L4-L5 which could be palpated over the region of the hips.  The dissection was carried down to the lumbodorsal fascia.  A number of radiographs were taken 3 total that could not identify the borders of the disc space at L4-5 or L5-S1.  This was because of significant adiposity.  Then fluoroscopy was used to again try to localize and ascertain the [id number] level.  With AP fluoroscopy were able to localize what we believed was the interspace at L4-L5 at the level of the superior iliac crest.  Dissection was carried down this area was marked.  Then by palpating from the sacrum a second localization was obtained identifying marked facet arthrosis at L5-S1 with no  evidence of motion at the L5-S1 joint but some motion at the L4-L5 level once this was verified the dissection was continued over the facet joints at L4-L5.  Then facetectomies were created removing the inferior margin lamina of L4 out to and including the entirety of the facet at L4-5.  The common dural tube was carefully identified and it was noted to be severely stenotic by dissecting out laterally we could identify the disc space which was deformed secondary to the spondylolisthesis thus confirming the L4-5 level then bilateral decompression was obtained performing a laminotomy and facetectomy on the opposite side.  Once the decompression was completed a self-retaining retractor was placed deeper into the wound and the area over the facets and intertransverse space at L4 and L5 was decorticated and packed off for later use and grafting.  The L4 nerve root superiorly and the L5 nerve root inferiorly were carefully decompressed of substantial overgrowth of ligamentous material and bony hypertrophy from the facets.  Once this was adequately decompressed and the disc base was isolated a discectomy was performed on both sides at L4-L5 removing a substantial quantity of severely degenerated desiccated disc material using combination of disc rongeurs and curettes in addition to disc shavers.  When the disc base was completely emptied of its contents the interspace was sized for an appropriate sized spacer and it was felt that a 10 x 9 x 23 mm spacer with 12 degrees lordosis would fit best into this interval a total of 12 cc of bone graft was packed into this interspace along with the 2 spacers.  Then the lateral gutters were packed with 6 cc of bone graft in each lateral gutter pedicle entry sites were carefully chosen at L4 and  L5 visualization of the pedicles was again very difficult the best projections yielded where in the AP projection 6.5 x 45 mm screws were placed into L4 and L5 using a combination of the  fluoroscopy in the AP projection and palpation.  No cut outs were noted 35 mm precontoured rods were then used to connect the screws in a neutral construct.  Final radiographs identified good position of the hardware the interbody decompression was then checked the pads of the L4 and L5 nerve roots in the common dural tube were noted to be well decompressed with this hemostasis was carefully and meticulously obtained.  Blood loss was noted before and 75 cc and approximately 140 cc of Cell Saver blood was returned to the patient.  25 cc of half percent Marcaine was injected into the paraspinous fascia and the fascia was closed with #1 Vicryl and the lumbodorsal fascia 2-0 Vicryl and 3-0 Vicryl in the subcutaneous tissues 3-0 Vicryl and 4-0 Vicryl subcuticularly Dermabond was placed on the skin.

## 2023-02-25 NOTE — Progress Notes (Signed)
Orthopedic Tech Progress Note Patient Details:  Jordan [name] 1951-07-17 [id number]  Ortho Devices Type of Ortho Device: Lumbar corsett Ortho Device/Splint Location: Back Ortho Device/Splint Interventions: Ordered      [name] A Rishabh Rinkenberger 02/25/2023, 2:07 PM

## 2023-02-25 NOTE — Anesthesia Postprocedure Evaluation (Signed)
Anesthesia Post Note  Patient: Jordan [name]  Procedure(s) Performed: POSTERIOR LUMBAR INTERBODY FUSION LUMBAR FOUR-LUMBAR FIVE (Back)     Patient location during evaluation: PACU Anesthesia Type: General Level of consciousness: awake and alert Pain management: pain level controlled Vital Signs Assessment: post-procedure vital signs reviewed and stable Respiratory status: spontaneous breathing, nonlabored ventilation, respiratory function stable and patient connected to nasal cannula oxygen Cardiovascular status: blood pressure returned to baseline and stable Postop Assessment: no apparent nausea or vomiting Anesthetic complications: no   No notable events documented.  Last Vitals:  Vitals:   02/25/23 1315 02/25/23 1343  BP: 112/73 123/70  Pulse: 78 80  Resp: 11 18  Temp: (!) 36.3 C 36.5 C  SpO2: 96% 98%    Last Pain:  Vitals:   02/25/23 1345  TempSrc:   PainSc: 5                  [place] P Lorelei Heikkila

## 2023-02-25 NOTE — Transfer of Care (Signed)
Immediate Anesthesia Transfer of Care Note  Patient: Jordan [name]  Procedure(s) Performed: POSTERIOR LUMBAR INTERBODY FUSION LUMBAR FOUR-LUMBAR FIVE (Back)  Patient Location: PACU  Anesthesia Type:General  Level of Consciousness: awake, alert , and oriented  Airway & Oxygen Therapy: Patient Spontanous Breathing and Patient connected to nasal cannula oxygen  Post-op Assessment: Report given to RN and Post -op Vital signs reviewed and stable  Post vital signs: Reviewed and stable  Last Vitals:  Vitals Value Taken Time  BP 121/66 02/25/23 1149  Temp    Pulse 85 02/25/23 1150  Resp 20 02/25/23 1150  SpO2 95 % 02/25/23 1150  Vitals shown include unvalidated device data.  Last Pain:  Vitals:   02/25/23 0612  TempSrc: Oral  PainSc:       Patients Stated Pain Goal: 0 ([id number])  Complications: No notable events documented.

## 2023-02-26 ENCOUNTER — Other Ambulatory Visit (HOSPITAL_BASED_OUTPATIENT_CLINIC_OR_DEPARTMENT_OTHER): Payer: Self-pay

## 2023-02-26 DIAGNOSIS — M4316 Spondylolisthesis, lumbar region: Secondary | ICD-10-CM | POA: Diagnosis not present

## 2023-02-26 LAB — CBC
HCT: 30.2 % — ABNORMAL LOW (ref 36.0–46.0)
Hemoglobin: 9.7 g/dL — ABNORMAL LOW (ref 12.0–15.0)
MCH: 31.3 pg (ref 26.0–34.0)
MCHC: 32.1 g/dL (ref 30.0–36.0)
MCV: 97.4 fL (ref 80.0–100.0)
Platelets: 186 10*3/uL (ref 150–400)
RBC: 3.1 MIL/uL — ABNORMAL LOW (ref 3.87–5.11)
RDW: 13.9 % (ref 11.5–15.5)
WBC: 9.4 10*3/uL (ref 4.0–10.5)
nRBC: 0 % (ref 0.0–0.2)

## 2023-02-26 LAB — BASIC METABOLIC PANEL
Anion gap: 4 — ABNORMAL LOW (ref 5–15)
BUN: 22 mg/dL (ref 8–23)
CO2: 24 mmol/L (ref 22–32)
Calcium: 8.3 mg/dL — ABNORMAL LOW (ref 8.9–10.3)
Chloride: 108 mmol/L (ref 98–111)
Creatinine, Ser: 1.38 mg/dL — ABNORMAL HIGH (ref 0.44–1.00)
GFR, Estimated: 41 mL/min — ABNORMAL LOW (ref >60)
Glucose, Bld: 155 mg/dL — ABNORMAL HIGH (ref 70–99)
Potassium: 4.2 mmol/L (ref 3.5–5.1)
Sodium: 136 mmol/L (ref 135–145)

## 2023-02-26 MED ORDER — DEXAMETHASONE 1 MG PO TABS
ORAL_TABLET | ORAL | 0 refills | Status: AC
  Filled 2023-02-26: qty 14, 6d supply, fill #0

## 2023-02-26 MED ORDER — METHOCARBAMOL 500 MG PO TABS
500.0000 mg | ORAL_TABLET | Freq: Four times a day (QID) | ORAL | 3 refills | Status: DC | PRN
  Filled 2023-02-26: qty 40, 10d supply, fill #0
  Filled 2023-03-07: qty 40, 10d supply, fill #1
  Filled 2023-04-10: qty 40, 10d supply, fill #2
  Filled 2023-05-19: qty 40, 10d supply, fill #3

## 2023-02-26 MED ORDER — OXYCODONE-ACETAMINOPHEN 5-325 MG PO TABS
1.0000 | ORAL_TABLET | ORAL | 0 refills | Status: DC | PRN
  Filled 2023-02-26: qty 60, 5d supply, fill #0

## 2023-02-26 MED ORDER — TRIAMTERENE-HCTZ 37.5-25 MG PO TABS
1.0000 | ORAL_TABLET | Freq: Every day | ORAL | Status: DC
  Filled 2023-02-26: qty 1

## 2023-02-26 MED ORDER — ONDANSETRON HCL 4 MG PO TABS
4.0000 mg | ORAL_TABLET | Freq: Four times a day (QID) | ORAL | 1 refills | Status: DC | PRN
  Filled 2023-02-26: qty 40, 10d supply, fill #0
  Filled 2023-03-07: qty 40, 10d supply, fill #1

## 2023-02-26 NOTE — Discharge Summary (Signed)
Physician Discharge Summary  Patient ID: Jordan [name] MRN: [id number] DOB/AGE: Jul 16, 1951 72 y.o.  Admit date: 02/25/2023 Discharge date: 02/26/2023  Admission Diagnoses: Spondylolisthesis L4-L5 with lumbar radiculopathy, neurogenic claudication.  Discharge Diagnoses: Low listhesis L4-L5 with lumbar radiculopathy, neurogenic claudication.  Acute blood loss anemia. Principal Problem:   Spondylolisthesis at L4-L5 level   Discharged Condition: good  Hospital Course: Patient was admitted to undergo surgical decompression arthrodesis at the level of L4-L5.  She tolerated surgery well however on postoperative laboratory studies it was noted that she had greater than 3 g loss of hemoglobin which is attributable to acute blood loss anemia.  She is not clinically unstable and no transfusion was performed.  Patient tolerated her postoperative course well and is discharged home.  Consults: None  Significant Diagnostic Studies: None  Treatments: surgery: See op note  Discharge Exam: Blood pressure 97/62, pulse 78, temperature 98.2 F (36.8 C), temperature source Oral, resp. rate 18, height '5\' 4"'$  (1.626 m), weight 118.8 kg, SpO2 95 %. Station and gait are intact incision is clean and dry.  Disposition: Discharge disposition: 01-Home or Self Care       Discharge Instructions     Call MD for:  redness, tenderness, or signs of infection (pain, swelling, redness, odor or green/yellow discharge around incision site)   Complete by: As directed    Call MD for:  severe uncontrolled pain   Complete by: As directed    Call MD for:  temperature >100.4   Complete by: As directed    Diet - low sodium heart healthy   Complete by: As directed    Discharge instructions   Complete by: As directed    Okay to shower. Do not apply salves or appointments to incision. No heavy lifting with the upper extremities greater than 10 pounds. May resume driving when not requiring pain medication and patient feels  comfortable with doing so.   Incentive spirometry RT   Complete by: As directed    Increase activity slowly   Complete by: As directed       Allergies as of 02/26/2023   No Known Allergies      Medication List     TAKE these medications    allopurinol 100 MG tablet Commonly known as: ZYLOPRIM Take 2 tablets (200 mg total) by mouth daily. What changed:  how much to take when to take this   aspirin EC 81 MG tablet Take 81 mg by mouth daily.   B-12 5000 MCG Caps Take 5,000 mcg by mouth daily.   Biotin 5000 5 MG Caps Generic drug: Biotin Take 5 mg by mouth daily.   Comirnaty syringe Generic drug: COVID-19 mRNA vaccine 2023-2024 Inject into the muscle.   D3 2000 50 MCG (2000 UT) Caps Generic drug: Cholecalciferol Take 2,000 Units by mouth daily.   dexamethasone 1 MG tablet Commonly known as: DECADRON 2 tablets twice daily for 2 days, one tablet twice daily for 2 days, one tablet daily for 2 days.   diclofenac 75 MG EC tablet Commonly known as: VOLTAREN Take 1 tablet by mouth 2 times daily What changed:  when to take this reasons to take this   diphenhydrAMINE 50 MG tablet Commonly known as: BENADRYL Take 100-150 mg by mouth at bedtime as needed for sleep.   DULoxetine 60 MG capsule Commonly known as: CYMBALTA Take 1 capsule (60 mg total) by mouth daily.   Fluad Quadrivalent 0.5 ML injection Generic drug: influenza vaccine adjuvanted Inject into the muscle.  furosemide 40 MG tablet Commonly known as: LASIX TAKE 1 TABLET BY MOUTH ONCE DAILY AS NEEDED   lisinopril 10 MG tablet Commonly known as: ZESTRIL Take 1 tablet (10 mg total) by mouth daily.   methocarbamol 500 MG tablet Commonly known as: ROBAXIN Take 1 tablet (500 mg total) by mouth every 6 (six) hours as needed for muscle spasms.   multivitamin with minerals Tabs tablet Take 1 tablet by mouth daily.   ondansetron 4 MG tablet Commonly known as: ZOFRAN Take 1 tablet (4 mg total) by mouth  every 6 (six) hours as needed for nausea or vomiting.   oxyCODONE-acetaminophen 5-325 MG tablet Commonly known as: PERCOCET/ROXICET Take 1-2 tablets by mouth every 4 (four) hours as needed for moderate pain or severe pain.   pantoprazole 40 MG tablet Commonly known as: PROTONIX TAKE 1 TABLET BY MOUTH ONCE DAILY   triamterene-hydrochlorothiazide 37.5-25 MG capsule Commonly known as: Dyazide Take 1 capsule by mouth daily.         Signed: Earleen Newport 02/26/2023, 9:19 AM

## 2023-02-26 NOTE — Plan of Care (Signed)
Pt doing well. Pt and daughter given D/C instructions with verbal understanding. Rx's were sent to the pharmacy by MD. Pt's incision is clean and dry with no sign of infection. Pt's IV was removed prior to D/C. Pt D/C'd home via wheelchair per MD order. Pt is stable @ D/C and has no other needs at this time. Holli Humbles, RN

## 2023-02-26 NOTE — Evaluation (Signed)
Physical Therapy Evaluation  Patient Details Name: Jordan [name] MRN: [id number] DOB: [birth year] Today's Date: 02/26/2023  History of Present Illness  Jordan [name] is a 72 yo female who presents s/p L4-L5 PLIF on 02/25/2023. PMH significant for cellulitis, depressive disorder,  gout, hyperglycemia, HTN, OA, SBO.   Clinical Impression  Pt admitted with above diagnosis. At the time of PT eval, pt was able to demonstrate transfers and ambulation with gross min guard assist and RW for support. Pt was educated on precautions, brace application/wearing schedule, appropriate activity progression, and car transfer. Pt currently with functional limitations due to the deficits listed below (see PT Problem List). Pt will benefit from skilled PT to increase their independence and safety with mobility to allow discharge to the venue listed below.         Recommendations for follow up therapy are one component of a multi-disciplinary discharge planning process, led by the attending physician.  Recommendations may be updated based on patient status, additional functional criteria and insurance authorization.  Follow Up Recommendations No PT follow up      Assistance Recommended at Discharge PRN  Patient can return home with the following  A little help with walking and/or transfers;A little help with bathing/dressing/bathroom;Assistance with cooking/housework;Assist for transportation;Help with stairs or ramp for entrance    Equipment Recommendations None recommended by PT  Recommendations for Other Services       Functional Status Assessment Patient has had a recent decline in their functional status and demonstrates the ability to make significant improvements in function in a reasonable and predictable amount of time.     Precautions / Restrictions Precautions Precautions: Fall;Back Precaution Booklet Issued: Yes (comment) Precaution Comments: Reviewed handout and pt was cued for precautions  during functional mobility. Required Braces or Orthoses: Spinal Brace Spinal Brace: Lumbar corset;Applied in sitting position Restrictions Weight Bearing Restrictions: No      Mobility  Bed Mobility Overal bed mobility: Needs Assistance Bed Mobility: Rolling, Sidelying to Sit, Sit to Sidelying Rolling: Supervision Sidelying to sit: Supervision     Sit to sidelying: Min assist General bed mobility comments: Assist for LE elevation back up into bed at end of session. Pt requires VC's throughout for optimal log roll technique. HOB flat and rails lowered to simulate home environment.    Transfers Overall transfer level: Needs assistance Equipment used: Rolling walker (2 wheels) Transfers: Sit to/from Stand Sit to Stand: Min guard           General transfer comment: VC's for hand placement on seated surface for safety. Pt requires increased time to power up to full stand.    Ambulation/Gait Ambulation/Gait assistance: Min guard Gait Distance (Feet): 300 Feet Assistive device: Rolling walker (2 wheels) Gait Pattern/deviations: Step-through pattern, Decreased stride length, Trunk flexed Gait velocity: Decreased Gait velocity interpretation: <1.31 ft/sec, indicative of household ambulator   General Gait Details: VC's for improved posture, closer walker proximity, and forward gaze. No assist required. No unsteadiness noted. Close guard provided throughout for safety as pt reports intermittent lightheadedness initially, however states improvement as activity progressed.  Stairs Stairs: Yes Stairs assistance: Min guard Stair Management: Two rails, Step to pattern, Forwards Number of Stairs: 4 General stair comments: VC's for sequencing and general safety. No assist required however hands on guarding provided throughout for safety.  Wheelchair Mobility    Modified Rankin (Stroke Patients Only)       Balance Overall balance assessment: Needs assistance Sitting-balance  support: Feet supported Sitting balance-Leahy Scale:  Good     Standing balance support: Single extremity supported, During functional activity Standing balance-Leahy Scale: Fair                               Pertinent Vitals/Pain Pain Assessment Pain Assessment: Faces Faces Pain Scale: Hurts a little bit Pain Location: incision site Pain Descriptors / Indicators: Operative site guarding, Sore    Home Living Family/patient expects to be discharged to:: Private residence Living Arrangements: Non-relatives/Friends;Children Available Help at Discharge: Family;Friend(s);Available 24 hours/day Type of Home: House Home Access: Stairs to enter Entrance Stairs-Rails: Psychiatric nurse of Steps: 4   Home Layout: Two level;Able to live on main level with bedroom/bathroom Home Equipment: Rollator (4 wheels);Cane - single point;BSC/3in1;Shower seat;Grab bars - tub/shower Additional Comments: daughter staying during the day; roommate/best friend lives with pt    Prior Function Prior Level of Function : Independent/Modified Independent             Mobility Comments: no AD ADLs Comments: indep, drives     Hand Dominance   Dominant Hand: Right    Extremity/Trunk Assessment   Upper Extremity Assessment Upper Extremity Assessment: Defer to OT evaluation    Lower Extremity Assessment Lower Extremity Assessment: Generalized weakness    Cervical / Trunk Assessment Cervical / Trunk Assessment: Back Surgery  Communication   Communication: No difficulties  Cognition Arousal/Alertness: Awake/alert Behavior During Therapy: WFL for tasks assessed/performed Overall Cognitive Status: Within Functional Limits for tasks assessed                                          General Comments General comments (skin integrity, edema, etc.): VSS on RA    Exercises     Assessment/Plan    PT Assessment Patient needs continued PT services  PT  Problem List Decreased strength;Decreased range of motion;Decreased activity tolerance;Decreased balance;Decreased mobility;Decreased knowledge of use of DME;Decreased safety awareness;Decreased knowledge of precautions;Pain       PT Treatment Interventions DME instruction;Gait training;Stair training;Functional mobility training;Therapeutic activities;Therapeutic exercise;Balance training;Patient/family education    PT Goals (Current goals can be found in the Care Plan section)  Acute Rehab PT Goals Patient Stated Goal: Home today PT Goal Formulation: With patient/family Time For Goal Achievement: 03/05/23 Potential to Achieve Goals: Good    Frequency Min 5X/week     Co-evaluation               AM-PAC PT "6 Clicks" Mobility  Outcome Measure Help needed turning from your back to your side while in a flat bed without using bedrails?: A Little Help needed moving from lying on your back to sitting on the side of a flat bed without using bedrails?: A Little Help needed moving to and from a bed to a chair (including a wheelchair)?: A Little Help needed standing up from a chair using your arms (e.g., wheelchair or bedside chair)?: A Little Help needed to walk in hospital room?: A Little Help needed climbing 3-5 steps with a railing? : A Little 6 Click Score: 18    End of Session Equipment Utilized During Treatment: Gait belt;Back brace Activity Tolerance: Patient tolerated treatment well Patient left: in bed;with call bell/phone within reach;with family/visitor present Nurse Communication: Mobility status PT Visit Diagnosis: Unsteadiness on feet (R26.81);Pain Pain - part of body:  (back)    Time: 0930-1002 PT  Time Calculation (min) (ACUTE ONLY): 32 min   Charges:   PT Evaluation $PT Eval Low Complexity: 1 Low PT Treatments $Gait Training: 8-22 mins        Rolinda Roan, PT, DPT Acute Rehabilitation Services Secure Chat Preferred Office: [phone]   RUSTY MANSER 02/26/2023, 11:22 AM

## 2023-02-26 NOTE — Evaluation (Signed)
Occupational Therapy Evaluation Patient Details Name: Jordan [name] MRN: [id number] DOB: 1951/09/26 Today's Date: 02/26/2023   History of Present Illness Jordan [name] is a 72 yo female who underwent  POSTERIOR LUMBAR INTERBODY FUSION LUMBAR FOUR-LUMBAR FIVE. PMHx: cellulitis, depressive disorder, GERD, gout, hyperglycemia, HTN, OA SBO   Clinical Impression   Jordan [name] was evaluated s/p the above spine surgery. She is indep at baseline. Upon evaluation she was limited by back pain, generalized weakness and back precautions. Overall she required min G for mobility and ADLs with RW. Provided cues and education on spinal precautions and compensatory techniques throughout, handout provided and pt demonstrated good recall. Pt does not require further acute OT services. Recommend d/c home with support of family.        Recommendations for follow up therapy are one component of a multi-disciplinary discharge planning process, led by the attending physician.  Recommendations may be updated based on patient status, additional functional criteria and insurance authorization.   Follow Up Recommendations  No OT follow up     Assistance Recommended at Discharge Intermittent Supervision/Assistance  Patient can return home with the following A little help with walking and/or transfers;A little help with bathing/dressing/bathroom;Assist for transportation;Help with stairs or ramp for entrance    Functional Status Assessment  Patient has had a recent decline in their functional status and demonstrates the ability to make significant improvements in function in a reasonable and predictable amount of time.  Equipment Recommendations          Precautions / Restrictions Precautions Precautions: Fall;Back Precaution Booklet Issued: Yes (comment) Required Braces or Orthoses: Spinal Brace Spinal Brace: Lumbar corset Restrictions Weight Bearing Restrictions: No      Mobility Bed Mobility Overal bed  mobility: Needs Assistance Bed Mobility: Rolling, Sidelying to Sit Rolling: Supervision Sidelying to sit: Supervision       General bed mobility comments: cues for log roll    Transfers Overall transfer level: Needs assistance Equipment used: Rolling walker (2 wheels) Transfers: Sit to/from Stand Sit to Stand: Min guard                  Balance Overall balance assessment: Needs assistance Sitting-balance support: Feet supported Sitting balance-Leahy Scale: Good     Standing balance support: Single extremity supported, During functional activity Standing balance-Leahy Scale: Fair                             ADL either performed or assessed with clinical judgement   ADL Overall ADL's : Needs assistance/impaired Eating/Feeding: Supervision/ safety   Grooming: Supervision/safety;Standing   Upper Body Bathing: Supervision/ safety   Lower Body Bathing: Min guard;Sit to/from stand   Upper Body Dressing : Set up;Sitting   Lower Body Dressing: Min guard;Sit to/from stand   Toilet Transfer: Supervision/safety;Ambulation;Rolling walker (2 wheels)   Toileting- Clothing Manipulation and Hygiene: Supervision/safety;Sitting/lateral lean       Functional mobility during ADLs: Min guard;Rolling walker (2 wheels) General ADL Comments: cues for back precautions throughout     Vision Baseline Vision/History: 0 No visual deficits Vision Assessment?: No apparent visual deficits     Perception Perception Perception Tested?: No   Praxis Praxis Praxis tested?: Not tested    Pertinent Vitals/Pain Pain Assessment Pain Assessment: Faces Faces Pain Scale: Hurts a little bit     Hand Dominance Right   Extremity/Trunk Assessment Upper Extremity Assessment Upper Extremity Assessment: Overall WFL for tasks assessed   Lower Extremity  Assessment Lower Extremity Assessment: Defer to PT evaluation   Cervical / Trunk Assessment Cervical / Trunk Assessment:  Back Surgery   Communication Communication Communication: No difficulties   Cognition Arousal/Alertness: Awake/alert Behavior During Therapy: WFL for tasks assessed/performed Overall Cognitive Status: Within Functional Limits for tasks assessed                                       General Comments  VSS on RA     Home Living Family/patient expects to be discharged to:: Private residence Living Arrangements: Non-relatives/Friends;Children Available Help at Discharge: Family;Friend(s);Available 24 hours/day Type of Home: House Home Access: Stairs to enter [employer] of Steps: 4 Entrance Stairs-Rails: Right;Left Home Layout: Two level;Able to live on main level with bedroom/bathroom     Bathroom Shower/Tub: Teacher, early years/pre: Standard     Home Equipment: Rollator (4 wheels);Cane - single point;BSC/3in1;Shower seat;Grab bars - tub/shower   Additional Comments: daughter staying during the day; roommate/best friend lives with pt      Prior Functioning/Environment Prior Level of Function : Independent/Modified Independent             Mobility Comments: no AD ADLs Comments: indep, drives        OT Problem List: Decreased strength;Decreased range of motion;Impaired balance (sitting and/or standing);Decreased activity tolerance;Decreased safety awareness;Decreased knowledge of use of DME or AE;Decreased knowledge of precautions;Pain      OT Treatment/Interventions: Self-care/ADL training;Therapeutic exercise;DME and/or AE instruction;Therapeutic activities;Patient/family education;Balance training    OT Goals(Current goals can be found in the care plan section) Acute Rehab OT Goals Patient Stated Goal: home OT Goal Formulation: With patient Time For Goal Achievement: 03/12/23 Potential to Achieve Goals: Good  OT Frequency: Min 2X/week       AM-PAC OT "6 Clicks" Daily Activity     Outcome Measure Help from another person  eating meals?: None Help from another person taking care of personal grooming?: A Little Help from another person toileting, which includes using toliet, bedpan, or urinal?: A Little Help from another person bathing (including washing, rinsing, drying)?: A Little Help from another person to put on and taking off regular upper body clothing?: None Help from another person to put on and taking off regular lower body clothing?: A Little 6 Click Score: 20   End of Session Equipment Utilized During Treatment: Rolling walker (2 wheels) Nurse Communication: Mobility status  Activity Tolerance: Patient tolerated treatment well Patient left: in bed;with call bell/phone within reach  OT Visit Diagnosis: Unsteadiness on feet (R26.81);Other abnormalities of gait and mobility (R26.89);Muscle weakness (generalized) (M62.81);Pain                Time: [id number] OT Time Calculation (min): 17 min Charges:  OT General Charges $OT Visit: 1 Visit OT Evaluation $OT Eval Moderate Complexity: [place], OTR/L [place] [facility] [place] Communication Preferred   Elliot Cousin 02/26/2023, 9:27 AM

## 2023-02-28 MED FILL — Sodium Chloride IV Soln 0.9%: INTRAVENOUS | Qty: 1000 | Status: AC

## 2023-02-28 MED FILL — Heparin Sodium (Porcine) Inj 1000 Unit/ML: INTRAMUSCULAR | Qty: 30 | Status: AC

## 2023-03-07 ENCOUNTER — Other Ambulatory Visit: Payer: Self-pay

## 2023-03-07 ENCOUNTER — Other Ambulatory Visit: Payer: Self-pay | Admitting: Family

## 2023-03-07 ENCOUNTER — Other Ambulatory Visit (HOSPITAL_BASED_OUTPATIENT_CLINIC_OR_DEPARTMENT_OTHER): Payer: Self-pay

## 2023-03-07 MED ORDER — TRIAMTERENE-HCTZ 37.5-25 MG PO CAPS
1.0000 | ORAL_CAPSULE | Freq: Every day | ORAL | 1 refills | Status: DC
  Filled 2023-03-07: qty 90, 90d supply, fill #0

## 2023-03-11 ENCOUNTER — Other Ambulatory Visit (HOSPITAL_BASED_OUTPATIENT_CLINIC_OR_DEPARTMENT_OTHER): Payer: Self-pay

## 2023-03-11 MED ORDER — OXYCODONE-ACETAMINOPHEN 5-325 MG PO TABS
1.0000 | ORAL_TABLET | Freq: Four times a day (QID) | ORAL | 0 refills | Status: DC | PRN
  Filled 2023-03-11: qty 40, 10d supply, fill #0

## 2023-03-12 DIAGNOSIS — M4316 Spondylolisthesis, lumbar region: Secondary | ICD-10-CM | POA: Diagnosis not present

## 2023-03-31 NOTE — Progress Notes (Signed)
Subjective:   By signing my name below, I, Jordan [name], attest that this documentation has been prepared under the direction and in the presence of [name], NP 04/01/23   Patient ID: Jordan [name], female    DOB: 1951-04-07, 72 y.o.   MRN: [id number]  Chief Complaint  Patient presents with   Hypertension    Here for follow up   Diabetes    Here for follow up   Gynecologic Exam    HPI Patient is in today for a 3 month follow up.   Lumbar surgery: She recently had lumbar surgery on 02/25/2023 and is currently still on medical leave. She reports she is recovering well. She is complaint with post-op recommendations to no bending, lifting, or twisting. Her daughter has been helping and monitoring her. Her pain is well managed.   Headaches: She reports new headaches with pain in the crown of her head with an associated spinning sensation. She notes she previously has no history of headaches. At times she has had to stay in bed almost all day due to pain. She has been taking Zyrtec and Tylenol to manage pain and notes it has helped.  Gout: Overall her gout is well managed. She reports no new concerns. She is compliant with 100 mg Allopurinol twice daily  GERD: She is compliant with 40 mg Protonix daily. She continues to experience heartburn. She believes it may be associated with her eat in the bed, especially post-op.   Mood: She is compliant with 60 mg Cymbalta daily. She reports she has been "grouchy" recently within the last 6 months. She believes this may coincide with her surgery.   Past Medical History:  Diagnosis Date   Acute hypoxemic respiratory failure due to COVID-19 (HCC) 10/08/2020   Cellulitis and abscess of finger, unspecified 401.9   Cellulitis and abscess of foot, except toes    Depressive disorder, not elsewhere classified    GERD (gastroesophageal reflux disease)    Gout, unspecified    History of tobacco abuse 12/01/2018   Hyperglycemia  05/08/2011   Hyperlipidemia    borderline- not on meds   Hypertension    Insomnia    Leg edema    chronic   Obesity, unspecified    Osteoarthritis    Renal insufficiency, mild 05/08/2011   SBO (small bowel obstruction) (HCC) 12/01/2018   Small bowel obstruction (HCC)     Past Surgical History:  Procedure Laterality Date   ABDOMINAL HYSTERECTOMY  2002   infection- no history of cancer   APPENDECTOMY  2002   catacact Bilateral 11/01/2019   CHOLECYSTECTOMY  1977   ECTOPIC PREGNANCY SURGERY  1971   1 tube removed   right shoulder rotator cuff repair  06/07/2010   TOTAL HIP ARTHROPLASTY Left 1998    Family History  Problem Relation Age of Onset   Diabetes Mother    Cancer Mother        colon/ pancreatic   Colon cancer Mother 53   Hypertension Father    Alcohol abuse Father    Cirrhosis Father    Other Sister        Pyeoderma gangrenosis   Diabetes Sister        type 2   Kidney failure Sister    Liver disease Sister    Cirrhosis Sister        had NASH, died heart failure   Heart attack Brother    Stomach cancer Maternal Grandfather    Obesity Daughter  Rectal cancer Neg Hx    Esophageal cancer Neg Hx     Social History   Socioeconomic History   Marital status: Divorced    Spouse name: Not on file   Number of children: 2   Years of education: Not on file   Highest education level: Not on file  Occupational History   Occupation: works prn at [employer]    Employer: [employer]  Tobacco Use   Smoking status: Former    Packs/day: .25    Types: Cigarettes    Quit date: 09/25/2020    Years since quitting: 2.5   Smokeless tobacco: Never  Vaping Use   Vaping Use: Never used  Substance and Sexual Activity   Alcohol use: Not Currently   Drug use: No   Sexual activity: Not Currently  Other Topics Concern   Not on file  Social History Narrative   2 children (daughter and son) both local. 3 grandchildren, one great grandchild   Works at ED front desk   Divorced  (married x 30 years)   Social Determinants of Health   Financial Resource Strain: Low Risk  (07/16/2021)   Overall Financial Resource Strain (CARDIA)    Difficulty of Paying Living Expenses: Not hard at all  Food Insecurity: No Food Insecurity (07/16/2021)   Hunger Vital Sign    Worried About Running Out of Food in the Last Year: Never true    Ran Out of Food in the Last Year: Never true  Transportation Needs: No Transportation Needs (07/16/2021)   PRAPARE - Administrator, Civil Service (Medical): No    Lack of Transportation (Non-Medical): No  Physical Activity: Insufficiently Active (07/16/2021)   Exercise Vital Sign    Days of Exercise per Week: 3 days    Minutes of Exercise per Session: 30 min  Stress: No Stress Concern Present (07/16/2021)   [employer] of Occupational Health - Occupational Stress Questionnaire    Feeling of Stress : Not at all  Social Connections: Moderately Isolated (07/16/2021)   Social Connection and Isolation Panel [NHANES]    Frequency of Communication with Friends and Family: More than three times a week    Frequency of Social Gatherings with Friends and Family: More than three times a week    Attends Religious Services: More than 4 times per year    Active Member of [employer] or Organizations: No    Attends Banker Meetings: Never    Marital Status: Divorced  Catering manager Violence: Not At Risk (07/16/2021)   Humiliation, Afraid, Rape, and Kick questionnaire    Fear of Current or Ex-Partner: No    Emotionally Abused: No    Physically Abused: No    Sexually Abused: No    Outpatient Medications Prior to Visit  Medication Sig Dispense Refill   allopurinol (ZYLOPRIM) 100 MG tablet Take 2 tablets (200 mg total) by mouth daily. (Patient taking differently: Take 100 mg by mouth 2 (two) times daily.) 180 tablet 1   aspirin EC 81 MG tablet Take 81 mg by mouth daily.     Biotin (BIOTIN 5000) 5 MG CAPS Take 5 mg by mouth daily.      Cholecalciferol (D3 2000) 50 MCG (2000 UT) CAPS Take 2,000 Units by mouth daily.     COVID-19 mRNA vaccine 2023-2024 (COMIRNATY) syringe Inject into the muscle. 0.3 mL 0   Cyanocobalamin (B-12) 5000 MCG CAPS Take 5,000 mcg by mouth daily.     diclofenac (VOLTAREN) 75 MG EC  tablet Take 1 tablet by mouth 2 times daily (Patient taking differently: Take 75 mg by mouth 2 (two) times daily as needed for mild pain.) 60 tablet 2   diphenhydrAMINE (BENADRYL) 50 MG tablet Take 100-150 mg by mouth at bedtime as needed for sleep.     DULoxetine (CYMBALTA) 60 MG capsule Take 1 capsule (60 mg total) by mouth daily. 90 capsule 1   furosemide (LASIX) 40 MG tablet TAKE 1 TABLET BY MOUTH ONCE DAILY AS NEEDED 90 tablet 1   influenza vaccine adjuvanted (FLUAD) 0.5 ML injection Inject into the muscle. 0.5 mL 0   lisinopril (ZESTRIL) 10 MG tablet Take 1 tablet (10 mg total) by mouth daily. 90 tablet 1   methocarbamol (ROBAXIN) 500 MG tablet Take 1 tablet (500 mg total) by mouth every 6 (six) hours as needed for muscle spasms. 40 tablet 3   Multiple Vitamin (MULTIVITAMIN WITH MINERALS) TABS tablet Take 1 tablet by mouth daily.     pantoprazole (PROTONIX) 40 MG tablet TAKE 1 TABLET BY MOUTH ONCE DAILY 90 tablet 1   triamterene-hydrochlorothiazide (DYAZIDE) 37.5-25 MG capsule Take 1 capsule by mouth daily. 90 capsule 1   ondansetron (ZOFRAN) 4 MG tablet Take 1 tablet (4 mg total) by mouth every 6 (six) hours as needed for nausea or vomiting. 40 tablet 1   oxyCODONE-acetaminophen (PERCOCET/ROXICET) 5-325 MG tablet Take 1-2 tablets by mouth every 4 (four) hours as needed for moderate pain or severe pain. 60 tablet 0   oxyCODONE-acetaminophen (PERCOCET/ROXICET) 5-325 MG tablet Take 1 tablet by mouth every 6 (six) hours as needed. 40 tablet 0   No facility-administered medications prior to visit.    No Known Allergies  Review of Systems  Gastrointestinal:  Positive for heartburn.  Neurological:  Positive for headaches.        Objective:    Physical Exam Constitutional:      General: She is not in acute distress.    Appearance: Normal appearance. She is well-developed.  HENT:     Head: Normocephalic and atraumatic.     Right Ear: External ear normal.     Left Ear: External ear normal.  Eyes:     General: No scleral icterus. Neck:     Thyroid: No thyromegaly.  Cardiovascular:     Rate and Rhythm: Normal rate and regular rhythm.     Heart sounds: Normal heart sounds. No murmur heard. Pulmonary:     Effort: Pulmonary effort is normal. No respiratory distress.     Breath sounds: Normal breath sounds. No wheezing.  Musculoskeletal:     Cervical back: Neck supple.  Skin:    General: Skin is warm and dry.  Neurological:     Mental Status: She is alert and oriented to person, place, and time.  Psychiatric:        Mood and Affect: Mood normal.        Behavior: Behavior normal.        Thought Content: Thought content normal.        Judgment: Judgment normal.     BP 125/76 (BP Location: Right Arm, Patient Position: Sitting, Cuff Size: Large)   Pulse 93   Temp 97.9 F (36.6 C) (Oral)   Resp 16   Wt 264 lb (119.7 kg)   SpO2 100%   BMI 45.32 kg/m  Wt Readings from Last 3 Encounters:  04/01/23 264 lb (119.7 kg)  02/25/23 262 lb (118.8 kg)  02/20/23 262 lb 9.6 oz (119.1 kg)  Assessment & Plan:  Spondylolisthesis at L4-L5 level Assessment & Plan: Pain is improved post operatively.  She continues to wear a brace and has follo wup scheduled with his neurosurgeon.    Nonintractable headache, unspecified chronicity pattern, unspecified headache type Assessment & Plan: Improving with tylenol and zyrtec.     Hypothyroidism, unspecified type Assessment & Plan: Lab Results  Component Value Date   TSH 3.02 11/06/2022   Thyroid testing normal in November. Not on synthorid.    Gout of left wrist, unspecified cause, unspecified chronicity Assessment & Plan: Stable on daily  allopurinol.    Gastroesophageal reflux disease without esophagitis Assessment & Plan: Stable on daily protonix.  She is no longer taking pepcid.     Essential hypertension Assessment & Plan: BP Readings from Last 3 Encounters:  04/01/23 125/76  02/26/23 97/62  02/20/23 125/75   BP stable and at goal on dyazide and lisinopril.    Current mild episode of major depressive disorder, unspecified whether recurrent Assessment & Plan: Notes some irritability since her surgery.  She continues cymbalta.    Benign paroxysmal positional vertigo, unspecified laterality Assessment & Plan: Only occasional symptoms which improve with meclizine.    Controlled type 2 diabetes mellitus without complication, without long-term current use of insulin Assessment & Plan: Due to hx of DM2 and morbid obesity, will see if we can get her started on mounjaro.   Orders: -     Basic metabolic panel -     Hemoglobin A1c -     Tirzepatide; Inject 2.5 mg into the skin once a week.  Dispense: 2 mL; Refill: 0     I,Jordan Rivera,acting as a scribe for [name], NP.,have documented all relevant documentation on the behalf of [name], NP,as directed by  [name], NP while in the presence of [name], NP.   I, [name], NP, personally preformed the services described in this documentation.  All medical record entries made by the scribe were at my direction and in my presence.  I have reviewed the chart and discharge instructions (if applicable) and agree that the record reflects my personal performance and is accurate and complete. 04/01/23   [name], NP

## 2023-04-01 ENCOUNTER — Telehealth: Payer: Self-pay

## 2023-04-01 ENCOUNTER — Other Ambulatory Visit (HOSPITAL_BASED_OUTPATIENT_CLINIC_OR_DEPARTMENT_OTHER): Payer: Self-pay

## 2023-04-01 ENCOUNTER — Ambulatory Visit (INDEPENDENT_AMBULATORY_CARE_PROVIDER_SITE_OTHER): Payer: PPO | Admitting: Family

## 2023-04-01 VITALS — BP 125/76 | HR 93 | Temp 97.9°F | Resp 16 | Wt 264.0 lb

## 2023-04-01 DIAGNOSIS — E039 Hypothyroidism, unspecified: Secondary | ICD-10-CM

## 2023-04-01 DIAGNOSIS — M109 Gout, unspecified: Secondary | ICD-10-CM

## 2023-04-01 DIAGNOSIS — R519 Headache, unspecified: Secondary | ICD-10-CM

## 2023-04-01 DIAGNOSIS — K219 Gastro-esophageal reflux disease without esophagitis: Secondary | ICD-10-CM

## 2023-04-01 DIAGNOSIS — I10 Essential (primary) hypertension: Secondary | ICD-10-CM | POA: Diagnosis not present

## 2023-04-01 DIAGNOSIS — M4316 Spondylolisthesis, lumbar region: Secondary | ICD-10-CM

## 2023-04-01 DIAGNOSIS — H8110 Benign paroxysmal vertigo, unspecified ear: Secondary | ICD-10-CM | POA: Diagnosis not present

## 2023-04-01 DIAGNOSIS — F320 Major depressive disorder, single episode, mild: Secondary | ICD-10-CM | POA: Diagnosis not present

## 2023-04-01 DIAGNOSIS — E119 Type 2 diabetes mellitus without complications: Secondary | ICD-10-CM

## 2023-04-01 LAB — BASIC METABOLIC PANEL
BUN: 25 mg/dL — ABNORMAL HIGH (ref 6–23)
CO2: 20 mEq/L (ref 19–32)
Calcium: 9.8 mg/dL (ref 8.4–10.5)
Chloride: 108 mEq/L (ref 96–112)
Creatinine, Ser: 1.22 mg/dL — ABNORMAL HIGH (ref 0.40–1.20)
GFR: 44.33 mL/min — ABNORMAL LOW (ref >60.00)
Glucose, Bld: 110 mg/dL — ABNORMAL HIGH (ref 70–99)
Potassium: 4.4 mEq/L (ref 3.5–5.1)
Sodium: 140 mEq/L (ref 135–145)

## 2023-04-01 LAB — HEMOGLOBIN A1C: Hgb A1c MFr Bld: 5.9 % (ref 4.6–6.5)

## 2023-04-01 MED ORDER — TIRZEPATIDE 2.5 MG/0.5ML ~~LOC~~ SOAJ
2.5000 mg | SUBCUTANEOUS | 0 refills | Status: DC
  Filled 2023-04-01: qty 2, 28d supply, fill #0

## 2023-04-01 NOTE — Assessment & Plan Note (Signed)
Lab Results  Component Value Date   TSH 3.02 11/06/2022   Thyroid testing normal in November. Not on synthorid.

## 2023-04-01 NOTE — Assessment & Plan Note (Signed)
Only occasional symptoms which improve with meclizine.

## 2023-04-01 NOTE — Telephone Encounter (Signed)
PA initiated via Covermymeds; KEY: [id number]. Awaiting determination.

## 2023-04-01 NOTE — Assessment & Plan Note (Signed)
Stable on daily protonix.  She is no longer taking pepcid.

## 2023-04-01 NOTE — Assessment & Plan Note (Signed)
Notes some irritability since her surgery.  She continues cymbalta.

## 2023-04-01 NOTE — Assessment & Plan Note (Signed)
Improving with tylenol and zyrtec.

## 2023-04-01 NOTE — Assessment & Plan Note (Signed)
BP Readings from Last 3 Encounters:  04/01/23 125/76  02/26/23 97/62  02/20/23 125/75   BP stable and at goal on dyazide and lisinopril.

## 2023-04-01 NOTE — Assessment & Plan Note (Signed)
Pain is improved post operatively.  She continues to wear a brace and has follo wup scheduled with his neurosurgeon.

## 2023-04-01 NOTE — Assessment & Plan Note (Signed)
Due to hx of DM2 and morbid obesity, will see if we can get her started on mounjaro.

## 2023-04-01 NOTE — Telephone Encounter (Signed)
PA approved.   09-APR-24:09-APR-25 Mounjaro 2.5MG /0.5ML [place] SOPN Quantity:2;

## 2023-04-01 NOTE — Assessment & Plan Note (Signed)
Stable on daily allopurinol.

## 2023-04-02 ENCOUNTER — Other Ambulatory Visit (HOSPITAL_BASED_OUTPATIENT_CLINIC_OR_DEPARTMENT_OTHER): Payer: Self-pay

## 2023-04-11 ENCOUNTER — Other Ambulatory Visit (HOSPITAL_BASED_OUTPATIENT_CLINIC_OR_DEPARTMENT_OTHER): Payer: Self-pay

## 2023-04-16 ENCOUNTER — Other Ambulatory Visit: Payer: Self-pay | Admitting: Family

## 2023-04-16 ENCOUNTER — Other Ambulatory Visit (HOSPITAL_BASED_OUTPATIENT_CLINIC_OR_DEPARTMENT_OTHER): Payer: Self-pay

## 2023-04-16 DIAGNOSIS — M4316 Spondylolisthesis, lumbar region: Secondary | ICD-10-CM | POA: Diagnosis not present

## 2023-04-16 DIAGNOSIS — E119 Type 2 diabetes mellitus without complications: Secondary | ICD-10-CM

## 2023-04-16 MED ORDER — MOUNJARO 2.5 MG/0.5ML ~~LOC~~ SOAJ
2.5000 mg | SUBCUTANEOUS | 0 refills | Status: DC
  Filled 2023-04-16 – 2023-04-23 (×3): qty 2, 28d supply, fill #0

## 2023-04-20 ENCOUNTER — Other Ambulatory Visit: Payer: Self-pay | Admitting: Family

## 2023-04-21 ENCOUNTER — Other Ambulatory Visit (HOSPITAL_BASED_OUTPATIENT_CLINIC_OR_DEPARTMENT_OTHER): Payer: Self-pay

## 2023-04-21 MED ORDER — PANTOPRAZOLE SODIUM 40 MG PO TBEC
40.0000 mg | DELAYED_RELEASE_TABLET | Freq: Every day | ORAL | 1 refills | Status: DC
  Filled 2023-04-21: qty 90, 90d supply, fill #0
  Filled 2023-05-13 – 2023-07-27 (×2): qty 90, 90d supply, fill #1

## 2023-04-22 ENCOUNTER — Other Ambulatory Visit (HOSPITAL_BASED_OUTPATIENT_CLINIC_OR_DEPARTMENT_OTHER): Payer: Self-pay

## 2023-04-22 ENCOUNTER — Ambulatory Visit (INDEPENDENT_AMBULATORY_CARE_PROVIDER_SITE_OTHER): Payer: PPO | Admitting: Family

## 2023-04-22 ENCOUNTER — Encounter: Payer: Self-pay | Admitting: Family

## 2023-04-22 DIAGNOSIS — G894 Chronic pain syndrome: Secondary | ICD-10-CM | POA: Diagnosis not present

## 2023-04-22 DIAGNOSIS — D649 Anemia, unspecified: Secondary | ICD-10-CM | POA: Diagnosis not present

## 2023-04-22 MED ORDER — TRAMADOL HCL 50 MG PO TABS
50.0000 mg | ORAL_TABLET | Freq: Four times a day (QID) | ORAL | 0 refills | Status: AC | PRN
  Filled 2023-04-22: qty 20, 5d supply, fill #0

## 2023-04-22 MED ORDER — TIRZEPATIDE 5 MG/0.5ML ~~LOC~~ SOAJ
5.0000 mg | SUBCUTANEOUS | 2 refills | Status: DC
  Filled 2023-04-22: qty 2, 28d supply, fill #0

## 2023-04-22 NOTE — Assessment & Plan Note (Signed)
Wt Readings from Last 3 Encounters:  04/22/23 260 lb (117.9 kg)  04/01/23 264 lb (119.7 kg)  02/25/23 262 lb (118.8 kg)   Maintained on mounjaro 2.5mg .  Has had some GI bloating but no other significant side effects.  She has lost 4 pounds since last visit. I had planned to increase her mounjaro to 5 mg but this is not currently available.  Will increase to 7.5.

## 2023-04-22 NOTE — Progress Notes (Signed)
Subjective:   By signing my name below, I, Jordan [name], attest that this documentation has been prepared under the direction and in the presence of Sandford Craze, NP.  04/22/2023.   Patient ID: Jordan [name], female    DOB: June 08, 1951, 72 y.o.   MRN: [id number]  Chief Complaint  Patient presents with   Obesity    Follow up, on mounjaro    HPI Patient is in today for an office visit.  Mounjaro:  Given her history of diabetes mellitus type 2 and morbid obesity, we started her on mounjaro at her last visit 04/01/23. Today, she states that she has been on [facility] for three weeks. Currently she is on the 2.5 mg dose. She is uncertain if her symptoms are side effects due to the [facility] rather than her recent back surgery. However, for a few nights she has struggled with abdominal bloating, and she is fearful of constipation as a side effect given her history of bowel obstruction. She notes that her appetite has decreased significantly since starting the [facility]. For instance, she may have one sandwich throughout the day. She has lost 4 lbs. Wt Readings from Last 3 Encounters:  04/22/23 260 lb (117.9 kg)  04/01/23 264 lb (119.7 kg)  02/25/23 262 lb (118.8 kg)   Joint pain:  Additionally she complains of left shoulder pain and left groin pain. 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 She wonders if tramadol would work for her pain management as it has in the past. She has seen orthopedics and was instructed to keep taking her Robaxin which does help somewhat. Of note, she states that her groin pain has been similar to the muscle pain she experienced after her back surgery.  Swelling:  She denies any recent issues with edema or swelling.    Past Medical History:  Diagnosis Date   Acute hypoxemic respiratory failure due to COVID-19 (HCC) 10/08/2020   Cellulitis and abscess of finger, unspecified 401.9   Cellulitis and  abscess of foot, except toes    Depressive disorder, not elsewhere classified    GERD (gastroesophageal reflux disease)    Gout, unspecified    History of tobacco abuse 12/01/2018   Hyperglycemia 05/08/2011   Hyperlipidemia    borderline- not on meds   Hypertension    Insomnia    Leg edema    chronic   Obesity, unspecified    Osteoarthritis    Renal insufficiency, mild 05/08/2011   SBO (small bowel obstruction) (HCC) 12/01/2018   Small bowel obstruction (HCC)     Past Surgical History:  Procedure Laterality Date   ABDOMINAL HYSTERECTOMY  2002   infection- no history of cancer   APPENDECTOMY  2002   catacact Bilateral 11/01/2019   CHOLECYSTECTOMY  1977   ECTOPIC PREGNANCY SURGERY  1971   1 tube removed   right shoulder rotator cuff repair  06/07/2010   TOTAL HIP ARTHROPLASTY Left 1998    Family History  Problem Relation Age of Onset   Diabetes Mother    Cancer Mother        colon/ pancreatic   Colon cancer Mother 50   Hypertension Father    Alcohol abuse Father    Cirrhosis Father    Other Sister        Pyeoderma gangrenosis   Diabetes Sister        type 2   Kidney failure Sister  Liver disease Sister    Cirrhosis Sister        had NASH, died heart failure   Heart attack Brother    Stomach cancer Maternal Grandfather    Obesity Daughter    Rectal cancer Neg Hx    Esophageal cancer Neg Hx     Social History   Socioeconomic History   Marital status: Divorced    Spouse name: Not on file   Number of children: 2   Years of education: Not on file   Highest education level: Not on file  Occupational History   Occupation: works prn at [employer]    Employer: [employer]  Tobacco Use   Smoking status: Former    Packs/day: .25    Types: Cigarettes    Quit date: 09/25/2020    Years since quitting: 2.5   Smokeless tobacco: Never  Vaping Use   Vaping Use: Never used  Substance and Sexual Activity   Alcohol use: Not Currently   Drug use: No   Sexual activity:  Not Currently  Other Topics Concern   Not on file  Social History Narrative   2 children (daughter and son) both local. 3 grandchildren, one great grandchild   Works at ED front desk   Divorced (married x 30 years)   Social Determinants of Health   Financial Resource Strain: Low Risk  (07/16/2021)   Overall Financial Resource Strain (CARDIA)    Difficulty of Paying Living Expenses: Not hard at all  Food Insecurity: No Food Insecurity (07/16/2021)   Hunger Vital Sign    Worried About Running Out of Food in the Last Year: Never true    Ran Out of Food in the Last Year: Never true  Transportation Needs: No Transportation Needs (07/16/2021)   PRAPARE - Administrator, Civil Service (Medical): No    Lack of Transportation (Non-Medical): No  Physical Activity: Insufficiently Active (07/16/2021)   Exercise Vital Sign    Days of Exercise per Week: 3 days    Minutes of Exercise per Session: 30 min  Stress: No Stress Concern Present (07/16/2021)   [employer] of Occupational Health - Occupational Stress Questionnaire    Feeling of Stress : Not at all  Social Connections: Moderately Isolated (07/16/2021)   Social Connection and Isolation Panel [NHANES]    Frequency of Communication with Friends and Family: More than three times a week    Frequency of Social Gatherings with Friends and Family: More than three times a week    Attends Religious Services: More than 4 times per year    Active Member of [employer] or Organizations: No    Attends Banker Meetings: Never    Marital Status: Divorced  Catering manager Violence: Not At Risk (07/16/2021)   Humiliation, Afraid, Rape, and Kick questionnaire    Fear of Current or Ex-Partner: No    Emotionally Abused: No    Physically Abused: No    Sexually Abused: No    Outpatient Medications Prior to Visit  Medication Sig Dispense Refill   allopurinol (ZYLOPRIM) 100 MG tablet Take 2 tablets (200 mg total) by mouth daily.  (Patient taking differently: Take 100 mg by mouth 2 (two) times daily.) 180 tablet 1   aspirin EC 81 MG tablet Take 81 mg by mouth daily.     Biotin (BIOTIN 5000) 5 MG CAPS Take 5 mg by mouth daily.     Cholecalciferol (D3 2000) 50 MCG (2000 UT) CAPS Take 2,000 Units by mouth  daily.     COVID-19 mRNA vaccine 2023-2024 (COMIRNATY) syringe Inject into the muscle. 0.3 mL 0   Cyanocobalamin (B-12) 5000 MCG CAPS Take 5,000 mcg by mouth daily.     diclofenac (VOLTAREN) 75 MG EC tablet Take 1 tablet by mouth 2 times daily (Patient taking differently: Take 75 mg by mouth 2 (two) times daily as needed for mild pain.) 60 tablet 2   diphenhydrAMINE (BENADRYL) 50 MG tablet Take 100-150 mg by mouth at bedtime as needed for sleep.     DULoxetine (CYMBALTA) 60 MG capsule Take 1 capsule (60 mg total) by mouth daily. 90 capsule 1   furosemide (LASIX) 40 MG tablet TAKE 1 TABLET BY MOUTH ONCE DAILY AS NEEDED 90 tablet 1   influenza vaccine adjuvanted (FLUAD) 0.5 ML injection Inject into the muscle. 0.5 mL 0   lisinopril (ZESTRIL) 10 MG tablet Take 1 tablet (10 mg total) by mouth daily. 90 tablet 1   methocarbamol (ROBAXIN) 500 MG tablet Take 1 tablet (500 mg total) by mouth every 6 (six) hours as needed for muscle spasms. 40 tablet 3   Multiple Vitamin (MULTIVITAMIN WITH MINERALS) TABS tablet Take 1 tablet by mouth daily.     pantoprazole (PROTONIX) 40 MG tablet Take 1 tablet (40 mg total) by mouth daily. 90 tablet 1   triamterene-hydrochlorothiazide (DYAZIDE) 37.5-25 MG capsule Take 1 capsule by mouth daily. 90 capsule 1   tirzepatide (MOUNJARO) 2.5 MG/0.5ML Pen Inject 2.5 mg into the skin once a week. 2 mL 0   No facility-administered medications prior to visit.    No Known Allergies  Review of Systems  Gastrointestinal:  Positive for abdominal pain.       (+) Loss of appetite  Musculoskeletal:  Positive for joint pain (Left shoulder and left hip).    See HPI.     Objective:    Physical  Exam Constitutional:      Appearance: Normal appearance.  HENT:     Head: Normocephalic and atraumatic.     Right Ear: Tympanic membrane, ear canal and external ear normal.     Left Ear: Tympanic membrane, ear canal and external ear normal.  Eyes:     Extraocular Movements: Extraocular movements intact.     Pupils: Pupils are equal, round, and reactive to light.  Cardiovascular:     Rate and Rhythm: Normal rate and regular rhythm.     Heart sounds: Normal heart sounds. No murmur heard.    No gallop.  Pulmonary:     Effort: Pulmonary effort is normal. No respiratory distress.     Breath sounds: Normal breath sounds. No wheezing or rales.  Skin:    General: Skin is warm and dry.  Neurological:     General: No focal deficit present.     Mental Status: She is alert and oriented to person, place, and time.  Psychiatric:        Mood and Affect: Mood normal.        Behavior: Behavior normal.     BP 111/64 (BP Location: Right Arm, Patient Position: Sitting, Cuff Size: Large)   Pulse 97   Temp 97.7 F (36.5 C) (Oral)   Resp 16   Wt 260 lb (117.9 kg)   SpO2 98%   BMI 44.63 kg/m  Wt Readings from Last 3 Encounters:  04/22/23 260 lb (117.9 kg)  04/01/23 264 lb (119.7 kg)  02/25/23 262 lb (118.8 kg)    Diabetic Foot Exam - Simple   No data filed  Lab Results  Component Value Date   WBC 9.4 02/26/2023   HGB 9.7 (L) 02/26/2023   HCT 30.2 (L) 02/26/2023   PLT 186 02/26/2023   GLUCOSE 110 (H) 04/01/2023   CHOL 158 10/28/2022   TRIG 75.0 10/28/2022   HDL 68.10 10/28/2022   LDLDIRECT 111.0 07/20/2019   LDLCALC 75 10/28/2022   ALT 27 01/14/2023   AST 18 01/14/2023   NA 140 04/01/2023   K 4.4 04/01/2023   CL 108 04/01/2023   CREATININE 1.22 (H) 04/01/2023   BUN 25 (H) 04/01/2023   CO2 20 04/01/2023   TSH 3.02 11/06/2022   INR 1.0 01/16/2023   HGBA1C 5.9 04/01/2023   MICROALBUR 15.3 (H) 10/28/2022    Lab Results  Component Value Date   TSH 3.02 11/06/2022    Lab Results  Component Value Date   WBC 9.4 02/26/2023   HGB 9.7 (L) 02/26/2023   HCT 30.2 (L) 02/26/2023   MCV 97.4 02/26/2023   PLT 186 02/26/2023   Lab Results  Component Value Date   NA 140 04/01/2023   K 4.4 04/01/2023   CO2 20 04/01/2023   GLUCOSE 110 (H) 04/01/2023   BUN 25 (H) 04/01/2023   CREATININE 1.22 (H) 04/01/2023   BILITOT 0.4 01/14/2023   ALKPHOS 98 01/14/2023   AST 18 01/14/2023   ALT 27 01/14/2023   PROT 6.8 01/14/2023   ALBUMIN 4.4 01/14/2023   CALCIUM 9.8 04/01/2023   ANIONGAP 4 (L) 02/26/2023   GFR 44.33 (L) 04/01/2023   Lab Results  Component Value Date   CHOL 158 10/28/2022   Lab Results  Component Value Date   HDL 68.10 10/28/2022   Lab Results  Component Value Date   LDLCALC 75 10/28/2022   Lab Results  Component Value Date   TRIG 75.0 10/28/2022   Lab Results  Component Value Date   CHOLHDL 2 10/28/2022   Lab Results  Component Value Date   HGBA1C 5.9 04/01/2023       Assessment & Plan:   Problem List Items Addressed This Visit       Other   Morbidly obese (HCC) - Primary    Wt Readings from Last 3 Encounters:  04/22/23 260 lb (117.9 kg)  04/01/23 264 lb (119.7 kg)  02/25/23 262 lb (118.8 kg)  Maintained on mounjaro 2.5mg .  Has had some GI bloating but no other significant side effects.  She has lost 4 pounds since last visit.       Relevant Medications   tirzepatide (MOUNJARO) 5 MG/0.5ML Pen     Meds ordered this encounter  Medications   tirzepatide (MOUNJARO) 5 MG/0.5ML Pen    Sig: Inject 5 mg into the skin once a week.    Dispense:  6 mL    Refill:  2    Order Specific Question:   Supervising Provider    Answer:   Danise Edge A [[id number]]   traMADol (ULTRAM) 50 MG tablet    Sig: Take 1 tablet (50 mg total) by mouth every 6 (six) hours as needed for up to 5 days.    Dispense:  20 tablet    Refill:  0    Order Specific Question:   Supervising Provider    Answer:   Urbano Heir, personally preformed the services described in this documentation.  All medical record entries made by the scribe were at my direction and in my presence.  I have reviewed the  chart and discharge instructions (if applicable) and agree that the record reflects my personal performance and is accurate and complete. 04/22/2023.  I,Mathew Stumpf,acting as a Neurosurgeon for [employer], NP.,have documented all relevant documentation on the behalf of [name], NP,as directed by  [name], NP while in the presence of [name], NP.   Jordan [name]

## 2023-04-23 ENCOUNTER — Other Ambulatory Visit (HOSPITAL_BASED_OUTPATIENT_CLINIC_OR_DEPARTMENT_OTHER): Payer: Self-pay

## 2023-04-23 MED ORDER — TIRZEPATIDE 7.5 MG/0.5ML ~~LOC~~ SOAJ
7.5000 mg | SUBCUTANEOUS | 1 refills | Status: DC
  Filled 2023-04-23: qty 2, 28d supply, fill #0

## 2023-04-24 ENCOUNTER — Telehealth: Payer: Self-pay | Admitting: Family

## 2023-04-24 DIAGNOSIS — D649 Anemia, unspecified: Secondary | ICD-10-CM

## 2023-04-24 NOTE — Telephone Encounter (Signed)
Patient was advised of message from provider. She will need to call back for appointment because did not have her calendar with her

## 2023-04-24 NOTE — Telephone Encounter (Signed)
I was reviewing her lab work from her hospitalization.  She was quite anemic.  I would like for her to return to lab for some follow up blood work to make sure this is stable.

## 2023-04-24 NOTE — Assessment & Plan Note (Signed)
Noted during perioperative labs from her posterior lumbar fusion back in March.  Will repeat CBC, check iron studies and check IFOB- see phone message.

## 2023-04-24 NOTE — Assessment & Plan Note (Signed)
She is requesting rx for tramadol for prn use on her upcoming cruise. Rx provided for 5 day supply of tramadol.

## 2023-05-01 ENCOUNTER — Other Ambulatory Visit: Payer: PPO

## 2023-05-05 DIAGNOSIS — Z96642 Presence of left artificial hip joint: Secondary | ICD-10-CM | POA: Diagnosis not present

## 2023-05-05 DIAGNOSIS — Z7982 Long term (current) use of aspirin: Secondary | ICD-10-CM | POA: Diagnosis not present

## 2023-05-05 DIAGNOSIS — N1831 Chronic kidney disease, stage 3a: Secondary | ICD-10-CM | POA: Diagnosis not present

## 2023-05-05 DIAGNOSIS — M109 Gout, unspecified: Secondary | ICD-10-CM | POA: Diagnosis not present

## 2023-05-05 DIAGNOSIS — Z79899 Other long term (current) drug therapy: Secondary | ICD-10-CM | POA: Diagnosis not present

## 2023-05-05 DIAGNOSIS — K219 Gastro-esophageal reflux disease without esophagitis: Secondary | ICD-10-CM | POA: Diagnosis not present

## 2023-05-05 DIAGNOSIS — E1122 Type 2 diabetes mellitus with diabetic chronic kidney disease: Secondary | ICD-10-CM | POA: Diagnosis not present

## 2023-05-05 DIAGNOSIS — I951 Orthostatic hypotension: Secondary | ICD-10-CM | POA: Diagnosis not present

## 2023-05-05 DIAGNOSIS — T50995A Adverse effect of other drugs, medicaments and biological substances, initial encounter: Secondary | ICD-10-CM | POA: Diagnosis not present

## 2023-05-05 DIAGNOSIS — N281 Cyst of kidney, acquired: Secondary | ICD-10-CM | POA: Diagnosis not present

## 2023-05-05 DIAGNOSIS — K3189 Other diseases of stomach and duodenum: Secondary | ICD-10-CM | POA: Diagnosis not present

## 2023-05-05 DIAGNOSIS — Z9889 Other specified postprocedural states: Secondary | ICD-10-CM | POA: Diagnosis not present

## 2023-05-05 DIAGNOSIS — E662 Morbid (severe) obesity with alveolar hypoventilation: Secondary | ICD-10-CM | POA: Diagnosis not present

## 2023-05-05 DIAGNOSIS — E860 Dehydration: Secondary | ICD-10-CM | POA: Diagnosis not present

## 2023-05-05 DIAGNOSIS — K5730 Diverticulosis of large intestine without perforation or abscess without bleeding: Secondary | ICD-10-CM | POA: Diagnosis not present

## 2023-05-05 DIAGNOSIS — K529 Noninfective gastroenteritis and colitis, unspecified: Secondary | ICD-10-CM | POA: Diagnosis not present

## 2023-05-05 DIAGNOSIS — I9589 Other hypotension: Secondary | ICD-10-CM | POA: Diagnosis not present

## 2023-05-05 DIAGNOSIS — R531 Weakness: Secondary | ICD-10-CM | POA: Diagnosis not present

## 2023-05-05 DIAGNOSIS — N179 Acute kidney failure, unspecified: Secondary | ICD-10-CM | POA: Diagnosis not present

## 2023-05-05 DIAGNOSIS — N1330 Unspecified hydronephrosis: Secondary | ICD-10-CM | POA: Diagnosis not present

## 2023-05-05 DIAGNOSIS — Z7409 Other reduced mobility: Secondary | ICD-10-CM | POA: Diagnosis not present

## 2023-05-05 DIAGNOSIS — F418 Other specified anxiety disorders: Secondary | ICD-10-CM | POA: Diagnosis not present

## 2023-05-05 DIAGNOSIS — Z6841 Body Mass Index (BMI) 40.0 and over, adult: Secondary | ICD-10-CM | POA: Diagnosis not present

## 2023-05-05 DIAGNOSIS — K449 Diaphragmatic hernia without obstruction or gangrene: Secondary | ICD-10-CM | POA: Diagnosis not present

## 2023-05-05 DIAGNOSIS — R110 Nausea: Secondary | ICD-10-CM | POA: Diagnosis not present

## 2023-05-06 ENCOUNTER — Ambulatory Visit: Payer: PPO | Admitting: Family

## 2023-05-12 ENCOUNTER — Telehealth: Payer: Self-pay | Admitting: *Deleted

## 2023-05-12 ENCOUNTER — Encounter: Payer: Self-pay | Admitting: *Deleted

## 2023-05-12 NOTE — Transitions of Care (Post Inpatient/ED Visit) (Signed)
   05/12/2023  Name: PARNEET CHAVANA MRN: [id number] DOB: 08/01/1951  Today's TOC FU Call Status: Today's TOC FU Call Status:: Unsuccessul Call (1st Attempt)  Attempted to reach the patient regarding the most recent Inpatient visit; left HIPAA compliant voice message requesting call back  Follow Up Plan: Additional outreach attempts will be made to reach the patient to complete the Transitions of Care (Post Inpatient visit) call.   Caryl Pina, RN, BSN, CCRN Alumnus RN CM Care Coordination/ Transition of Care- [facility] Care Management [phone]: direct office

## 2023-05-12 NOTE — Progress Notes (Signed)
Subjective:   By signing my name below, I, Jordan [name], attest that this documentation has been prepared under the direction and in the presence of Jordan Fillers, NP 05/14/23   Patient ID: Jordan [name], female    DOB: 1951-12-07, 72 y.o.   MRN: [id number]  Chief Complaint  Patient presents with   Hospitalization Follow-up    HPI Patient is in today for a hospital follow up. He son is on speaker phone during today's visit.   Acute Kidney Injury: She was admitted to the [facility] ED in [place] for severe nausea and diarrhea. Prior to her admission she had constant diarrhea and loose bowels. Since her recent surgery she reports a low appetite and shortness of breath with minimal exertion. She was previously on 2.5 mg Mounjaro and became very ill when her dose increased. She states she only took one dose of her increased Mounjaro dose. While ill she was fatigued and was unable to work or go to her appointments. Her roommate called her son, who is a paramedic. When her son arrived (who is a paramedic), he did an EKG which reportedly appeared normal and took her blood pressure (60/40). He then called 911 and she was brought to the hospital.   When admitted to the hospital she was given 10 bags of fluid due to severe dehydration. Her WBC was elevated. She tested negative for c.diff. Her CT scan revealed fluid back up on right kidney. Since her discharge on 5/17, she has been drinking plenty water and is avoiding drinking diet coke. She has stopped diazide.   Past Medical History:  Diagnosis Date   Acute hypoxemic respiratory failure due to COVID-19 (HCC) 10/08/2020   Cellulitis and abscess of finger, unspecified 401.9   Cellulitis and abscess of foot, except toes    Depressive disorder, not elsewhere classified    GERD (gastroesophageal reflux disease)    Gout, unspecified    History of tobacco abuse 12/01/2018   Hyperglycemia 05/08/2011   Hyperlipidemia    borderline- not on meds    Hypertension    Insomnia    Leg edema    chronic   Obesity, unspecified    Osteoarthritis    Renal insufficiency, mild 05/08/2011   SBO (small bowel obstruction) (HCC) 12/01/2018   Small bowel obstruction (HCC)     Past Surgical History:  Procedure Laterality Date   ABDOMINAL HYSTERECTOMY  2002   infection- no history of cancer   APPENDECTOMY  2002   catacact Bilateral 11/01/2019   CHOLECYSTECTOMY  1977   ECTOPIC PREGNANCY SURGERY  1971   1 tube removed   right shoulder rotator cuff repair  06/07/2010   TOTAL HIP ARTHROPLASTY Left 1998    Family History  Problem Relation Age of Onset   Diabetes Mother    Cancer Mother        colon/ pancreatic   Colon cancer Mother 36   Hypertension Father    Alcohol abuse Father    Cirrhosis Father    Other Sister        Pyeoderma gangrenosis   Diabetes Sister        type 2   Kidney failure Sister    Liver disease Sister    Cirrhosis Sister        had NASH, died heart failure   Heart attack Brother    Stomach cancer Maternal Grandfather    Obesity Daughter    Rectal cancer Neg Hx    Esophageal cancer Neg Hx  Social History   Socioeconomic History   Marital status: Divorced    Spouse name: Not on file   Number of children: 2   Years of education: Not on file   Highest education level: Not on file  Occupational History   Occupation: works prn at [employer]    Employer: [employer]  Tobacco Use   Smoking status: Former    Packs/day: .25    Types: Cigarettes    Quit date: 09/25/2020    Years since quitting: 2.6   Smokeless tobacco: Never  Vaping Use   Vaping Use: Never used  Substance and Sexual Activity   Alcohol use: Not Currently   Drug use: No   Sexual activity: Not Currently  Other Topics Concern   Not on file  Social History Narrative   2 children (daughter and son) both local. 3 grandchildren, one great grandchild   Works at ED front desk   Divorced (married x 30 years)   Social Determinants of Health    Financial Resource Strain: Low Risk  (07/16/2021)   Overall Financial Resource Strain (CARDIA)    Difficulty of Paying Living Expenses: Not hard at all  Food Insecurity: No Food Insecurity (05/12/2023)   Hunger Vital Sign    Worried About Running Out of Food in the Last Year: Never true    Ran Out of Food in the Last Year: Never true  Transportation Needs: No Transportation Needs (05/12/2023)   PRAPARE - Administrator, Civil Service (Medical): No    Lack of Transportation (Non-Medical): No  Physical Activity: Insufficiently Active (07/16/2021)   Exercise Vital Sign    Days of Exercise per Week: 3 days    Minutes of Exercise per Session: 30 min  Stress: No Stress Concern Present (07/16/2021)   [employer] of Occupational Health - Occupational Stress Questionnaire    Feeling of Stress : Not at all  Social Connections: Moderately Isolated (07/16/2021)   Social Connection and Isolation Panel [NHANES]    Frequency of Communication with Friends and Family: More than three times a week    Frequency of Social Gatherings with Friends and Family: More than three times a week    Attends Religious Services: More than 4 times per year    Active Member of [employer] or Organizations: No    Attends Banker Meetings: Never    Marital Status: Divorced  Catering manager Violence: Not At Risk (07/16/2021)   Humiliation, Afraid, Rape, and Kick questionnaire    Fear of Current or Ex-Partner: No    Emotionally Abused: No    Physically Abused: No    Sexually Abused: No    Outpatient Medications Prior to Visit  Medication Sig Dispense Refill   aspirin EC 81 MG tablet Take 81 mg by mouth daily.     Biotin (BIOTIN 5000) 5 MG CAPS Take 5 mg by mouth daily.     Cholecalciferol (D3 2000) 50 MCG (2000 UT) CAPS Take 2,000 Units by mouth daily.     COVID-19 mRNA vaccine 2023-2024 (COMIRNATY) syringe Inject into the muscle. 0.3 mL 0   Cyanocobalamin (B-12) 5000 MCG CAPS Take 5,000 mcg  by mouth daily.     diclofenac (VOLTAREN) 75 MG EC tablet Take 1 tablet by mouth 2 times daily 60 tablet 2   furosemide (LASIX) 40 MG tablet TAKE 1 TABLET BY MOUTH ONCE DAILY AS NEEDED 90 tablet 1   influenza vaccine adjuvanted (FLUAD) 0.5 ML injection Inject into the muscle. 0.5 mL  0   methocarbamol (ROBAXIN) 500 MG tablet Take 1 tablet (500 mg total) by mouth every 6 (six) hours as needed for muscle spasms. 40 tablet 3   Multiple Vitamin (MULTIVITAMIN WITH MINERALS) TABS tablet Take 1 tablet by mouth daily.     pantoprazole (PROTONIX) 40 MG tablet Take 1 tablet (40 mg total) by mouth daily. 90 tablet 1   tirzepatide (MOUNJARO) 7.5 MG/0.5ML Pen Inject 7.5 mg into the skin once a week. 6 mL 1   allopurinol (ZYLOPRIM) 100 MG tablet Take 2 tablets (200 mg total) by mouth daily. (Patient taking differently: Take 100 mg by mouth 2 (two) times daily.) 180 tablet 1   DULoxetine (CYMBALTA) 60 MG capsule Take 1 capsule (60 mg total) by mouth daily. 90 capsule 1   diphenhydrAMINE (BENADRYL) 50 MG tablet Take 100-150 mg by mouth at bedtime as needed for sleep.     lisinopril (ZESTRIL) 10 MG tablet Take 1 tablet (10 mg total) by mouth daily. (Patient not taking: Reported on 05/13/2023) 90 tablet 1   triamterene-hydrochlorothiazide (DYAZIDE) 37.5-25 MG capsule Take 1 capsule by mouth daily. (Patient not taking: Reported on 05/13/2023) 90 capsule 1   No facility-administered medications prior to visit.    Allergies  Allergen Reactions   Mounjaro [Tirzepatide]     Severe nausea/vomitting, diarrhea    Review of Systems  Gastrointestinal:  Positive for diarrhea and nausea.       Objective:    Physical Exam Constitutional:      General: She is not in acute distress.    Appearance: Normal appearance. She is well-developed.  HENT:     Head: Normocephalic and atraumatic.     Right Ear: External ear normal.     Left Ear: External ear normal.  Eyes:     General: No scleral icterus. Neck:      Thyroid: No thyromegaly.  Cardiovascular:     Rate and Rhythm: Normal rate and regular rhythm.     Heart sounds: Normal heart sounds. No murmur heard. Pulmonary:     Effort: Pulmonary effort is normal. No respiratory distress.     Breath sounds: Normal breath sounds. No wheezing.  Musculoskeletal:     Cervical back: Neck supple.  Skin:    General: Skin is warm and dry.  Neurological:     Mental Status: She is alert and oriented to person, place, and time.  Psychiatric:        Mood and Affect: Mood normal.        Behavior: Behavior normal.        Thought Content: Thought content normal.        Judgment: Judgment normal.     BP 114/70 (BP Location: Right Arm, Patient Position: Sitting, Cuff Size: Large)   Pulse 97   Temp 97.6 F (36.4 C) (Oral)   Resp 16   Wt 252 lb (114.3 kg)   SpO2 96%   BMI 43.26 kg/m  Wt Readings from Last 3 Encounters:  05/13/23 252 lb (114.3 kg)  04/22/23 260 lb (117.9 kg)  04/01/23 264 lb (119.7 kg)       Assessment & Plan:  Hydronephrosis of right kidney Assessment & Plan: CT abd/pelvis noted:   Moderate right hydronephrosis without obstructing ureteral stones may represent recently passed stone, reflux or possibly chronic UPJ obstruction.   Will refer to Urology for further evaluation.   Orders: -     Ambulatory referral to Urology  Anemia, unspecified type -     IBC +  Ferritin -     CBC with Differential/Platelet  Renal insufficiency Assessment & Plan: Will place referral to nephrology. Encouraged hydration, nsaid avoidance.   Orders: -     Ambulatory referral to Nephrology -     Comprehensive metabolic panel  Essential hypertension Assessment & Plan: Dyazide and lisinopril were held during her hospitalization. As her  bp is stable, will continue to hold these medications.       I,Jordan Rivera,acting as a Neurosurgeon for Jordan Fillers, NP.,have documented all relevant documentation on the behalf of Jordan Fillers,  NP,as directed by  Jordan Fillers, NP while in the presence of Jordan Fillers, NP.   I, Jordan Fillers, NP, personally preformed the services described in this documentation.  All medical record entries made by the scribe were at my direction and in my presence.  I have reviewed the chart and discharge instructions (if applicable) and agree that the record reflects my personal performance and is accurate and complete. 05/14/23   Jordan Fillers, NP

## 2023-05-12 NOTE — Transitions of Care (Post Inpatient/ED Visit) (Signed)
05/12/2023  Name: Jordan [name] MRN: [id number] DOB: August 30, 1951  Today's TOC FU Call Status: Today's TOC FU Call Status:: Successful TOC FU Call Competed TOC FU Call Complete Date: 05/12/23  Transition Care Management Follow-up Telephone Call Date of Discharge: 05/09/23 Discharge Facility: Other ([facility]) Name of Other ([facility]) Discharge Facility: [facility] Type of Discharge: Inpatient Admission Primary Inpatient Discharge Diagnosis:: AKI/ nausea, abdominal pain/ diarrhea; acute renal failure How have you been since you were released from the hospital?: Better ("I am doing fine; not having any problems at this point; I work at [employer] and will be going back to work in a couple of days") Any questions or concerns?: No  Items Reviewed: Did you receive and understand the discharge instructions provided?: Yes (briefly reviewed with patient who verbalizes good understanding of same - outside hospital AVS) Medications obtained,verified, and reconciled?: Yes (Medications Reviewed) (Full medication reconciliation/ review completed; no concerns or discrepancies identified; self-manages medications and denies questions/ concerns around medications today) Any new allergies since your discharge?: No Dietary orders reviewed?: Yes Type of Diet Ordered:: "Healthy as possible" Do you have support at home?: Yes People in Home: friend(s) Name of Support/Comfort Primary Source: Reports independent in self-care activities; supportive family/ roommate assists as/ if needed/ indicated  Medications Reviewed Today: Medications Reviewed Today     Reviewed by Michaela Corner, RN (Registered Nurse) on 05/12/23 at 1414  Med List Status: <None>   Medication Order Taking? Sig Documenting Provider Last Dose Status Informant  allopurinol (ZYLOPRIM) 100 MG tablet [id number]  Take 2 tablets (200 mg total) by mouth daily.  Patient taking differently: Take 100 mg by mouth 2 (two) times daily.   Sandford Craze, NP  Active Self  aspirin EC 81 MG tablet [id number]  Take 81 mg by mouth daily. [provider]  Active Self  Biotin (BIOTIN 5000) 5 MG CAPS [id number]  Take 5 mg by mouth daily. [provider]  Active Self  Cholecalciferol (D3 2000) 50 MCG (2000 UT) CAPS [id number]  Take 2,000 Units by mouth daily. [provider]  Active Self  COVID-19 mRNA vaccine [id number] (COMIRNATY) syringe [id number]  Inject into the muscle. Judyann Munson, MD  Active Self  Cyanocobalamin (B-12) 5000 MCG CAPS [id number]  Take 5,000 mcg by mouth daily. [provider]  Active Self  diclofenac (VOLTAREN) 75 MG EC tablet [id number]  Take 1 tablet by mouth 2 times daily  Patient taking differently: Take 75 mg by mouth 2 (two) times daily as needed for mild pain.     Active Self  diphenhydrAMINE (BENADRYL) 50 MG tablet [id number]  Take 100-150 mg by mouth at bedtime as needed for sleep. [provider]  Active Self  DULoxetine (CYMBALTA) 60 MG capsule [id number]  Take 1 capsule (60 mg total) by mouth daily. Sandford Craze, NP  Active Self    Discontinued 03/03/12 0914   furosemide (LASIX) 40 MG tablet [id number]  TAKE 1 TABLET BY MOUTH ONCE DAILY AS NEEDED Sandford Craze, NP  Active Self  influenza vaccine adjuvanted (FLUAD) 0.5 ML injection [id number]  Inject into the muscle. Judyann Munson, MD  Active Self  lisinopril (ZESTRIL) 10 MG tablet [id number]  Take 1 tablet (10 mg total) by mouth daily. Sandford Craze, NP  Active Self  methocarbamol (ROBAXIN) 500 MG tablet [id number]  Take 1 tablet (500 mg total) by mouth every 6 (six) hours as needed for muscle spasms. Barnett Abu, MD  Active   Multiple Vitamin (MULTIVITAMIN WITH MINERALS) TABS tablet  [id number]  Take 1 tablet by mouth daily. [provider]  Active Self  pantoprazole (PROTONIX) 40 MG tablet [id number]  Take 1 tablet (40 mg total) by mouth daily. Sandford Craze, NP  Active   tirzepatide  [facility]) 7.5 MG/0.5ML Pen [id number]  Inject 7.5 mg into the skin once a week. Sandford Craze, NP  Active   triamterene-hydrochlorothiazide (DYAZIDE) 37.5-25 MG capsule [id number]  Take 1 capsule by mouth daily. Sandford Craze, NP  Active             Home Care and Equipment/Supplies: Were Home Health Services Ordered?: No Any new equipment or medical supplies ordered?: No  Functional Questionnaire: Do you need assistance with bathing/showering or dressing?: No Do you need assistance with meal preparation?: No Do you need assistance with eating?: No Do you have difficulty maintaining continence: No Do you need assistance with getting out of bed/getting out of a chair/moving?: No Do you have difficulty managing or taking your medications?: No  Follow up appointments reviewed: PCP Follow-up appointment confirmed?: Yes Date of PCP follow-up appointment?: 05/13/23 Follow-up Provider: PCP Specialist Hospital Follow-up appointment confirmed?: NA (verified not indicated per hospital discharging provider discharge notes) Do you need transportation to your follow-up appointment?: No Do you understand care options if your condition(s) worsen?: Yes-patient verbalized understanding  SDOH Interventions Today    Flowsheet Row Most Recent Value  SDOH Interventions   Food Insecurity Interventions Intervention Not Indicated  Transportation Interventions Intervention Not Indicated  [drives self]      TOC Interventions Today    Flowsheet Row Most Recent Value  TOC Interventions   TOC Interventions Discussed/Reviewed TOC Interventions Discussed  [Patient declines need for ongoing/ further care coordination outreach,  no care coordination needs identified at time of TOC call today,  provided my direct contact information should questions/ concerns/ needs arise post-TOC call]      Interventions Today    Flowsheet Row Most Recent Value  Chronic Disease   Chronic disease during  today's visit Other  [acute renal failure/ AKI/ weakness]  General Interventions   General Interventions Discussed/Reviewed General Interventions Discussed, Doctor Visits, Durable Medical Equipment (DME)  Doctor Visits Discussed/Reviewed Doctor Visits Discussed, PCP  Durable Medical Equipment (DME) Other  [uses cane occasionally when she goes outside of her home]  PCP/Specialist Visits Compliance with follow-up visit  Nutrition Interventions   Nutrition Discussed/Reviewed Nutrition Discussed  Pharmacy Interventions   Pharmacy Dicussed/Reviewed Pharmacy Topics Discussed  [Full medication review with updating medication list in EHR per patient report]  Safety Interventions   Safety Discussed/Reviewed Safety Discussed      Caryl Pina, RN, BSN, CCRN Alumnus RN CM Care Coordination/ Transition of Care- [facility] Care Management [phone]: direct office

## 2023-05-13 ENCOUNTER — Other Ambulatory Visit (HOSPITAL_BASED_OUTPATIENT_CLINIC_OR_DEPARTMENT_OTHER): Payer: Self-pay

## 2023-05-13 ENCOUNTER — Ambulatory Visit (INDEPENDENT_AMBULATORY_CARE_PROVIDER_SITE_OTHER): Payer: PPO | Admitting: Family

## 2023-05-13 ENCOUNTER — Other Ambulatory Visit: Payer: Self-pay | Admitting: Family

## 2023-05-13 VITALS — BP 114/70 | HR 97 | Temp 97.6°F | Resp 16 | Wt 252.0 lb

## 2023-05-13 DIAGNOSIS — I10 Essential (primary) hypertension: Secondary | ICD-10-CM

## 2023-05-13 DIAGNOSIS — D649 Anemia, unspecified: Secondary | ICD-10-CM

## 2023-05-13 DIAGNOSIS — N289 Disorder of kidney and ureter, unspecified: Secondary | ICD-10-CM

## 2023-05-13 DIAGNOSIS — N1330 Unspecified hydronephrosis: Secondary | ICD-10-CM | POA: Diagnosis not present

## 2023-05-13 MED ORDER — ALLOPURINOL 100 MG PO TABS
200.0000 mg | ORAL_TABLET | Freq: Every day | ORAL | 1 refills | Status: DC
  Filled 2023-05-13: qty 180, 90d supply, fill #0
  Filled 2023-05-19 – 2023-09-05 (×3): qty 180, 90d supply, fill #1

## 2023-05-13 MED ORDER — DULOXETINE HCL 60 MG PO CPEP
60.0000 mg | ORAL_CAPSULE | Freq: Every day | ORAL | 1 refills | Status: DC
  Filled 2023-05-13: qty 90, 90d supply, fill #0
  Filled 2023-09-05: qty 90, 90d supply, fill #1

## 2023-05-14 ENCOUNTER — Other Ambulatory Visit: Payer: Self-pay

## 2023-05-14 ENCOUNTER — Other Ambulatory Visit: Payer: Self-pay | Admitting: Family

## 2023-05-14 DIAGNOSIS — N1330 Unspecified hydronephrosis: Secondary | ICD-10-CM | POA: Insufficient documentation

## 2023-05-14 LAB — CBC WITH DIFFERENTIAL/PLATELET
Basophils Absolute: 0.1 10*3/uL (ref 0.0–0.1)
Basophils Relative: 0.7 % (ref 0.0–3.0)
Eosinophils Absolute: 0.1 10*3/uL (ref 0.0–0.7)
Eosinophils Relative: 1.4 % (ref 0.0–5.0)
HCT: 36.8 % (ref 36.0–46.0)
Hemoglobin: 12 g/dL (ref 12.0–15.0)
Lymphocytes Relative: 29.9 % (ref 12.0–46.0)
Lymphs Abs: 2.9 10*3/uL (ref 0.7–4.0)
MCHC: 32.5 g/dL (ref 30.0–36.0)
MCV: 94.4 fl (ref 78.0–100.0)
Monocytes Absolute: 0.5 10*3/uL (ref 0.1–1.0)
Monocytes Relative: 5.1 % (ref 3.0–12.0)
Neutro Abs: 6.1 10*3/uL (ref 1.4–7.7)
Neutrophils Relative %: 62.9 % (ref 43.0–77.0)
Platelets: 253 10*3/uL (ref 150.0–400.0)
RBC: 3.9 Mil/uL (ref 3.87–5.11)
RDW: 15.7 % — ABNORMAL HIGH (ref 11.5–15.5)
WBC: 9.7 10*3/uL (ref 4.0–10.5)

## 2023-05-14 LAB — COMPREHENSIVE METABOLIC PANEL
ALT: 30 U/L (ref 0–35)
AST: 27 U/L (ref 0–37)
Albumin: 3.9 g/dL (ref 3.5–5.2)
Alkaline Phosphatase: 119 U/L — ABNORMAL HIGH (ref 39–117)
BUN: 25 mg/dL — ABNORMAL HIGH (ref 6–23)
CO2: 19 mEq/L (ref 19–32)
Calcium: 8.7 mg/dL (ref 8.4–10.5)
Chloride: 107 mEq/L (ref 96–112)
Creatinine, Ser: 1.43 mg/dL — ABNORMAL HIGH (ref 0.40–1.20)
GFR: 36.61 mL/min — ABNORMAL LOW (ref >60.00)
Glucose, Bld: 96 mg/dL (ref 70–99)
Potassium: 3.8 mEq/L (ref 3.5–5.1)
Sodium: 137 mEq/L (ref 135–145)
Total Bilirubin: 0.4 mg/dL (ref 0.2–1.2)
Total Protein: 6.1 g/dL (ref 6.0–8.3)

## 2023-05-14 LAB — IBC + FERRITIN
Ferritin: 96.5 ng/mL (ref 10.0–291.0)
Iron: 48 ug/dL (ref 42–145)
Saturation Ratios: 15.4 % — ABNORMAL LOW (ref 20.0–50.0)
TIBC: 310.8 ug/dL (ref 250.0–450.0)
Transferrin: 222 mg/dL (ref 212.0–360.0)

## 2023-05-14 NOTE — Assessment & Plan Note (Signed)
CT abd/pelvis noted:   Moderate right hydronephrosis without obstructing ureteral stones may represent recently passed stone, reflux or possibly chronic UPJ obstruction.   Will refer to Urology for further evaluation.

## 2023-05-14 NOTE — Assessment & Plan Note (Signed)
Dyazide and lisinopril were held during her hospitalization. As her  bp is stable, will continue to hold these medications.

## 2023-05-14 NOTE — Assessment & Plan Note (Addendum)
It sounds like she had a viral gastroenteritis superimposed on worsening mounjaro side effects from increase in mounjaro dose. Advised pt to remain off of mounjaro. Will place referral to nephrology. Encouraged hydration, nsaid avoidance.

## 2023-05-19 ENCOUNTER — Encounter: Payer: Self-pay | Admitting: Family

## 2023-05-20 ENCOUNTER — Other Ambulatory Visit: Payer: Self-pay

## 2023-05-20 ENCOUNTER — Other Ambulatory Visit (HOSPITAL_BASED_OUTPATIENT_CLINIC_OR_DEPARTMENT_OTHER): Payer: Self-pay

## 2023-05-20 MED ORDER — SCOPOLAMINE 1 MG/3DAYS TD PT72
1.0000 | MEDICATED_PATCH | TRANSDERMAL | 0 refills | Status: DC
  Filled 2023-05-20: qty 3, 9d supply, fill #0

## 2023-05-22 ENCOUNTER — Other Ambulatory Visit (HOSPITAL_BASED_OUTPATIENT_CLINIC_OR_DEPARTMENT_OTHER): Payer: Self-pay

## 2023-06-08 ENCOUNTER — Encounter: Payer: Self-pay | Admitting: Family

## 2023-06-09 ENCOUNTER — Ambulatory Visit (HOSPITAL_BASED_OUTPATIENT_CLINIC_OR_DEPARTMENT_OTHER)
Admission: RE | Admit: 2023-06-09 | Discharge: 2023-06-09 | Disposition: A | Discharge status: Home / Self Care | Payer: PPO | Source: Ambulatory Visit | Attending: Family Medicine | Admitting: Family Medicine

## 2023-06-09 ENCOUNTER — Telehealth (INDEPENDENT_AMBULATORY_CARE_PROVIDER_SITE_OTHER): Payer: PPO | Admitting: Family Medicine

## 2023-06-09 ENCOUNTER — Other Ambulatory Visit (INDEPENDENT_AMBULATORY_CARE_PROVIDER_SITE_OTHER): Payer: PPO

## 2023-06-09 ENCOUNTER — Encounter: Payer: Self-pay | Admitting: Family Medicine

## 2023-06-09 VITALS — BP 159/87 | HR 84

## 2023-06-09 DIAGNOSIS — R600 Localized edema: Secondary | ICD-10-CM

## 2023-06-09 LAB — CBC
HCT: 33.9 % — ABNORMAL LOW (ref 36.0–46.0)
Hemoglobin: 10.9 g/dL — ABNORMAL LOW (ref 12.0–15.0)
MCHC: 32.1 g/dL (ref 30.0–36.0)
MCV: 95.3 fl (ref 78.0–100.0)
Platelets: 243 10*3/uL (ref 150.0–400.0)
RBC: 3.55 Mil/uL — ABNORMAL LOW (ref 3.87–5.11)
RDW: 15.6 % — ABNORMAL HIGH (ref 11.5–15.5)
WBC: 6 10*3/uL (ref 4.0–10.5)

## 2023-06-09 LAB — COMPREHENSIVE METABOLIC PANEL
ALT: 23 U/L (ref 0–35)
AST: 24 U/L (ref 0–37)
Albumin: 3.9 g/dL (ref 3.5–5.2)
Alkaline Phosphatase: 120 U/L — ABNORMAL HIGH (ref 39–117)
BUN: 17 mg/dL (ref 6–23)
CO2: 24 mEq/L (ref 19–32)
Calcium: 9 mg/dL (ref 8.4–10.5)
Chloride: 106 mEq/L (ref 96–112)
Creatinine, Ser: 1.23 mg/dL — ABNORMAL HIGH (ref 0.40–1.20)
GFR: 43.84 mL/min — ABNORMAL LOW (ref >60.00)
Glucose, Bld: 112 mg/dL — ABNORMAL HIGH (ref 70–99)
Potassium: 3.7 mEq/L (ref 3.5–5.1)
Sodium: 140 mEq/L (ref 135–145)
Total Bilirubin: 0.5 mg/dL (ref 0.2–1.2)
Total Protein: 6.8 g/dL (ref 6.0–8.3)

## 2023-06-09 LAB — BRAIN NATRIURETIC PEPTIDE: Pro B Natriuretic peptide (BNP): 329 pg/mL — ABNORMAL HIGH (ref 0.0–100.0)

## 2023-06-09 MED ORDER — FUROSEMIDE 20 MG PO TABS
20.0000 mg | ORAL_TABLET | Freq: Two times a day (BID) | ORAL | 3 refills | Status: DC
Start: 2023-06-09 — End: 2023-11-14
  Filled 2023-06-09 – 2023-06-10 (×2): qty 60, 30d supply, fill #0
  Filled 2023-07-09: qty 60, 30d supply, fill #1
  Filled 2023-09-05: qty 60, 30d supply, fill #2
  Filled 2023-10-10: qty 60, 30d supply, fill #3

## 2023-06-09 MED ORDER — POTASSIUM CHLORIDE CRYS ER 10 MEQ PO TBCR
10.0000 meq | EXTENDED_RELEASE_TABLET | Freq: Every day | ORAL | 2 refills | Status: DC
Start: 2023-06-09 — End: 2023-09-02
  Filled 2023-06-09: qty 60, 30d supply, fill #0
  Filled 2023-06-10: qty 60, 60d supply, fill #0

## 2023-06-09 NOTE — Addendum Note (Signed)
Addended by: Mervin Kung A on: 06/09/2023 02:16 PM   Modules accepted: Orders

## 2023-06-09 NOTE — Progress Notes (Addendum)
[facility] [address] [place], [place] [address] [phone]  Date:  06/09/2023   Name:  Jordan [name]   DOB:  [birth year]   MRN:  [id number]  PCP:  Jordan Craze, NP    Chief Complaint: Edema   History of Present Illness:  Jordan [name] is a 72 y.o. very pleasant female patient who presents with the following:  Primary patient of my partner Jordan [name], virtual visit today for concern of bilateral leg swelling  Connected with patient via MyChart video.  Patient location is home, my location is also home.  Patient identity confirmed with 2 factors, she gives consent for virtual visit today.  The patient and myself are present on the visit today  History of diabetes, degenerative spine disease, hyperlipidemia, renal insufficiency with most recent creatinine 5/24, 1.43, GFR 37  Pt notes she just went on a cruise to [place]  About 3 weeks prior to her recent trip she was in the hospital with acute renal insuf- she was admitted 5/13 at a [facility] facility through 5/17 with AKI- it looks like pre-renal distress caused by recent addition of Mounjaor and associated nausea and diarrhea. See admission details below: Jordan [name] is a 72 y.o. female who presented from home to [facility] on 05/05/2023 complaining of intractable nausea and stomach discomfort she is accompanied by her son and daughter. They state that since she was started on Mounjaro she has had progressive abdominal discomfort. Patient has a history of GERD as well as chronic constipation and back pain. She underwent back surgery 8 weeks ago. She started Mounjaro 5 weeks ago. Since then she has had increasing nausea. She has been having loose dark stools. She has been feeling progressively weak. Today she was unable to get up out of bed and unable to walk to the bathroom. She contacted her son who is an EMT. He did her blood pressure and found that she  was orthostatic; blood pressure 90/60 with pulse 109, 80/40 with a pulse of 110 sitting, and 68/40 with a pulse of 120 standing. Insisted she come to the hospital be further evaluated. They deny any recent travel. She states that she has dogs which are not changed. She has been compliant with taking the medications as they are prescribed with the exception of Mounjaro which she completed this past Wednesday. She states she will use no longer. She does endorse decreased oral intake creak as well as decreased liquids. She denies any hematuria polyuria polydipsia. She denies any new back pain. She states she was diagnosed with diabetes with hemoglobin A1c of 6.9 "sometime ago".  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  - renal US done prior to discharge: showed mild hydronephrosis AKI - improved CKD 3a - likely pre-renal, with some post-renal from right hydronephrosis  - avoid nephrotoxic agents (ACE-I, ARBs). Monitor BMP Orthostatic hypotension - resolved -resolved with hydration Diabetes Mellitus Type 2 - Hold home noninsulin medications. Subq insulin initiated. Carb consistent diet. Hypoglycemia protocol.  -discontinue Mounjaro Medication changes: - stop taking the Mounjaro (  tirzepatide) - resume taking your Lasix on Monday 5/20 - do no resume your DYAZIDE (triamterene-hydrochlorothiazide) until the top number of your blood pressure is above 150   She has been taking lasix 40 daily since she was discharged home from [facility] hospital She noted  swelling of both legs when she arrived in [place] on 6/6- the left was worse She did not have swelling prior to this trip The whole time she was on her cruise her legs were swollen despite taking her lasix daily She got home from [place] 2 days ago-she notes her legs finally do look better today Family members noted her legs were "shiny" but no weeping or particular redness They were painful to the touch and uncomfortable with walking  No SOB, no orthopnea  She notes her weight is up 12 lbs, but she did not eat much during the trip as the food was not good 258 lbs today Pt notes her normal at home weight prior to trip was 244 Wt Readings from Last 3 Encounters:  05/13/23 252 lb (114.3 kg)  04/22/23 260 lb (117.9 kg)  04/01/23 264 lb (119.7 kg)   She is not taking K but she does have some at home in case needed No known prior history of CHF, noted 1 BNP on chart from 2021 which is normal.  I do not see a previous echocardiogram  Patient Active Problem List   Diagnosis Date Noted   Hydronephrosis of right kidney 05/14/2023   Anemia 04/24/2023   Nonintractable headache 04/01/2023   Spondylolisthesis at L4-L5 level 02/25/2023   Family history of rheumatoid arthritis 01/14/2023   Hypothyroidism 11/06/2022   AKI (acute kidney injury) (HCC) 07/11/2022   Diabetes type 2, controlled (HCC) 03/08/2022   Lumbar radiculopathy 11/09/2021   Cervical radiculopathy 11/09/2021   COVID-19 virus infection 08/08/2021   Chronic pain syndrome 06/26/2021   Morbidly obese (HCC) 10/09/2020   Hypokalemia 12/01/2018   History of tobacco abuse 12/01/2018   Benign paroxysmal positional vertigo 07/25/2015   Preventative health care 07/06/2012   GERD (gastroesophageal reflux disease) 01/03/2012   Renal insufficiency 05/08/2011   Gout 02/21/2010   Degenerative disc disease, lumbar 07/11/2009   Osteoarthritis 02/19/2009   Hyperlipidemia 01/16/2009   Obesity, Class III, BMI 40-49.9 (morbid obesity) (HCC)  02/17/2008   Depression 09/04/2007   Essential hypertension 09/04/2007    Past Medical History:  Diagnosis Date   Acute hypoxemic respiratory failure due to COVID-19 (HCC) 10/08/2020   Cellulitis and abscess of finger, unspecified 401.9   Cellulitis and abscess of foot, except toes    Depressive disorder, not elsewhere classified    GERD (gastroesophageal reflux disease)    Gout, unspecified    History of tobacco abuse 12/01/2018   Hyperglycemia 05/08/2011   Hyperlipidemia    borderline- not on meds   Hypertension    Insomnia    Leg edema    chronic   Obesity, unspecified    Osteoarthritis    Renal insufficiency, mild 05/08/2011   SBO (small bowel obstruction) (HCC) 12/01/2018   Small bowel obstruction (HCC)     Past Surgical History:  Procedure Laterality Date   ABDOMINAL HYSTERECTOMY  2002   infection- no history of cancer   APPENDECTOMY  2002   catacact Bilateral 11/01/2019   CHOLECYSTECTOMY  1977   ECTOPIC PREGNANCY SURGERY  1971   1 tube removed   right shoulder rotator cuff repair  06/07/2010   TOTAL HIP ARTHROPLASTY Left 1998    Social History  Tobacco Use   Smoking status: Former    Packs/day: .25    Types: Cigarettes    Quit date: 09/25/2020    Years since quitting: 2.7   Smokeless tobacco: Never  Vaping Use   Vaping Use: Never used  Substance Use Topics   Alcohol use: Not Currently   Drug use: No    Family History  Problem Relation Age of Onset   Diabetes Mother    Cancer Mother        colon/ pancreatic   Colon cancer Mother 70   Hypertension Father    Alcohol abuse Father    Cirrhosis Father    Other Sister        Pyeoderma gangrenosis   Diabetes Sister        type 2   Kidney failure Sister    Liver disease Sister    Cirrhosis Sister        had NASH, died heart failure   Heart attack Brother    Stomach cancer Maternal Grandfather    Obesity Daughter    Rectal cancer Neg Hx    Esophageal cancer Neg Hx     Allergies  Allergen  Reactions   Mounjaro [Tirzepatide]     Severe nausea/vomitting, diarrhea    Medication list has been reviewed and updated.  Current Outpatient Medications on File Prior to Visit  Medication Sig Dispense Refill   allopurinol (ZYLOPRIM) 100 MG tablet Take 2 tablets (200 mg total) by mouth daily. 180 tablet 1   aspirin EC 81 MG tablet Take 81 mg by mouth daily.     Biotin (BIOTIN 5000) 5 MG CAPS Take 5 mg by mouth daily.     Cholecalciferol (D3 2000) 50 MCG (2000 UT) CAPS Take 2,000 Units by mouth daily.     COVID-19 mRNA vaccine 2023-2024 (COMIRNATY) syringe Inject into the muscle. 0.3 mL 0   Cyanocobalamin (B-12) 5000 MCG CAPS Take 5,000 mcg by mouth daily.     diclofenac (VOLTAREN) 75 MG EC tablet Take 1 tablet by mouth 2 times daily 60 tablet 2   DULoxetine (CYMBALTA) 60 MG capsule Take 1 capsule (60 mg total) by mouth daily. 90 capsule 1   furosemide (LASIX) 40 MG tablet TAKE 1 TABLET BY MOUTH ONCE DAILY AS NEEDED 90 tablet 1   influenza vaccine adjuvanted (FLUAD) 0.5 ML injection Inject into the muscle. 0.5 mL 0   methocarbamol (ROBAXIN) 500 MG tablet Take 1 tablet (500 mg total) by mouth every 6 (six) hours as needed for muscle spasms. 40 tablet 3   Multiple Vitamin (MULTIVITAMIN WITH MINERALS) TABS tablet Take 1 tablet by mouth daily.     pantoprazole (PROTONIX) 40 MG tablet Take 1 tablet (40 mg total) by mouth daily. 90 tablet 1   scopolamine (TRANSDERM-SCOP) 1 MG/3DAYS Place 1 patch (1.5 mg total) onto the skin every 3 (three) days. 3 patch 0   [DISCONTINUED] famotidine (PEPCID AC) 10 MG chewable tablet Chew 10 mg by mouth daily.     No current facility-administered medications on file prior to visit.    Review of Systems:  As per HPI- otherwise negative.   Physical Examination: There were no vitals filed for this visit. There were no vitals filed for this visit. There is no height or weight on file to calculate BMI. Ideal Body Weight:   159/87, P 84 Patient observed her  MyChart video.  She looks well, no shortness of breath or distress is noted. She shows me her legs over MyChart,  no significant redness, no weeping.  However edema of both legs and ankles/calves is apparent  Assessment and Plan: Lower extremity edema - Plan: CBC, Comprehensive metabolic panel, B Nat Peptide, US Venous Img Lower Bilateral (DVT) Virtual visit today for concern of acute leg swelling during a recent cruise to [place].  Of note, patient was admitted to the hospital with nausea, vomiting, acute renal insufficiency about 3 weeks prior to her trip, this was thought related to recent addition of Mounjaro with associated GI upset  She is feeling better now except for lower extremity edema and we also note weight gain.  Will have her come in today for stat labs as well as a bilateral lower extremity ultrasound- Will plan further follow- up pending labs.   Signed Abbe Amsterdam, MD  Received labs and doppler- called pt   US Venous Img Lower Bilateral (DVT)  Result Date: 06/09/2023 CLINICAL DATA:  Bilateral lower extremity edema.  Evaluate for DVT. EXAM: BILATERAL LOWER EXTREMITY VENOUS DOPPLER ULTRASOUND TECHNIQUE: Gray-scale sonography with graded compression, as well as color Doppler and duplex ultrasound were performed to evaluate the lower extremity deep venous systems from the level of the common femoral vein and including the common femoral, femoral, profunda femoral, popliteal and calf veins including the posterior tibial, peroneal and gastrocnemius veins when visible. The superficial great saphenous vein was also interrogated. Spectral Doppler was utilized to evaluate flow at rest and with distal augmentation maneuvers in the common femoral, femoral and popliteal veins. COMPARISON:  None Available. FINDINGS: RIGHT LOWER EXTREMITY Common Femoral Vein: No evidence of thrombus. Normal compressibility, respiratory phasicity and response to augmentation. Saphenofemoral Junction: No evidence  of thrombus. Normal compressibility and flow on color Doppler imaging. Profunda Femoral Vein: No evidence of thrombus. Normal compressibility and flow on color Doppler imaging. Femoral Vein: No evidence of thrombus. Normal compressibility, respiratory phasicity and response to augmentation. Popliteal Vein: No evidence of thrombus. Normal compressibility, respiratory phasicity and response to augmentation. Calf Veins: No evidence of thrombus. Normal compressibility and flow on color Doppler imaging. Superficial Great Saphenous Vein: No evidence of thrombus. Normal compressibility. Other Findings:  None. LEFT LOWER EXTREMITY Common Femoral Vein: No evidence of thrombus. Normal compressibility, respiratory phasicity and response to augmentation. Saphenofemoral Junction: No evidence of thrombus. Normal compressibility and flow on color Doppler imaging. Profunda Femoral Vein: No evidence of thrombus. Normal compressibility and flow on color Doppler imaging. Femoral Vein: No evidence of thrombus. Normal compressibility, respiratory phasicity and response to augmentation. Popliteal Vein: No evidence of thrombus. Normal compressibility, respiratory phasicity and response to augmentation. Calf Veins: No evidence of thrombus. Normal compressibility and flow on color Doppler imaging. Superficial Great Saphenous Vein: No evidence of thrombus. Normal compressibility. Other Findings:  None. IMPRESSION: No evidence of DVT within either lower extremity. Electronically Signed   By: Simonne Come M.D.   On: 06/09/2023 16:26      Chemistry      Component Value Date/Time   NA 140 06/09/2023 1417   K 3.7 06/09/2023 1417   CL 106 06/09/2023 1417   CO2 24 06/09/2023 1417   BUN 17 06/09/2023 1417   CREATININE 1.23 (H) 06/09/2023 1417   CREATININE 1.14 (H) 10/27/2020 1045      Component Value Date/Time   CALCIUM 9.0 06/09/2023 1417   ALKPHOS 120 (H) 06/09/2023 1417   AST 24 06/09/2023 1417   ALT 23 06/09/2023 1417   BILITOT  0.5 06/09/2023 1417     Lab Results  Component Value Date  WBC 6.0 06/09/2023   HGB 10.9 (L) 06/09/2023   HCT 33.9 (L) 06/09/2023   MCV 95.3 06/09/2023   PLT 243.0 06/09/2023   BNP mid- range elevated at 329  Called pt- she has K on hand-650 mg OTC as well as some 40 mg lasix but it is old, from 2021 Will send in Kdur 10 and lasix 20 Will also order a chest film and echo asap  We discussed her mild anemia- pt notes this has been present on occasion over the last few months but cause is not apparent No bloody or black stools, no other apparent blood loss, Colon in 2022 On 5/17 mg 10.5 at [facility] hospital so stable to improved.  May be dilutional from fluid overload?  Recommended recheck in 1-2 months   She will pick up meds and get a chest film tomorrow am   Addnd 6/18- called pt with chest film from this am No effusion or pulmonary edema She is down 1 lb and legs improving Echo pending Made appt to see me next week Advised ok to scale lasix to once daily or none as swelling and weight normalize.  She is asked to keep me posted  DG Chest 2 View  Result Date: 06/10/2023 CLINICAL DATA:  elevated BNP, look for pulmonary edema EXAM: CHEST - 2 VIEW COMPARISON:  Chest x-ray 01/14/2023. FINDINGS: Similar mild streaky bibasilar opacities. No confluent consolidation. No visible pleural effusions or pneumothorax. Cardiomediastinal silhouette is unchanged. Similar scoliosis and polyarticular degenerative change. IMPRESSION: Similar mild streaky bibasilar opacities, favor atelectasis. No confluent consolidation. Electronically Signed   By: Feliberto Harts M.D.   On: 06/10/2023 10:13   US Venous Img Lower Bilateral (DVT)  Result Date: 06/09/2023 CLINICAL DATA:  Bilateral lower extremity edema.  Evaluate for DVT. EXAM: BILATERAL LOWER EXTREMITY VENOUS DOPPLER ULTRASOUND TECHNIQUE: Gray-scale sonography with graded compression, as well as color Doppler and duplex ultrasound were performed to  evaluate the lower extremity deep venous systems from the level of the common femoral vein and including the common femoral, femoral, profunda femoral, popliteal and calf veins including the posterior tibial, peroneal and gastrocnemius veins when visible. The superficial great saphenous vein was also interrogated. Spectral Doppler was utilized to evaluate flow at rest and with distal augmentation maneuvers in the common femoral, femoral and popliteal veins. COMPARISON:  None Available. FINDINGS: RIGHT LOWER EXTREMITY Common Femoral Vein: No evidence of thrombus. Normal compressibility, respiratory phasicity and response to augmentation. Saphenofemoral Junction: No evidence of thrombus. Normal compressibility and flow on color Doppler imaging. Profunda Femoral Vein: No evidence of thrombus. Normal compressibility and flow on color Doppler imaging. Femoral Vein: No evidence of thrombus. Normal compressibility, respiratory phasicity and response to augmentation. Popliteal Vein: No evidence of thrombus. Normal compressibility, respiratory phasicity and response to augmentation. Calf Veins: No evidence of thrombus. Normal compressibility and flow on color Doppler imaging. Superficial Great Saphenous Vein: No evidence of thrombus. Normal compressibility. Other Findings:  None. LEFT LOWER EXTREMITY Common Femoral Vein: No evidence of thrombus. Normal compressibility, respiratory phasicity and response to augmentation. Saphenofemoral Junction: No evidence of thrombus. Normal compressibility and flow on color Doppler imaging. Profunda Femoral Vein: No evidence of thrombus. Normal compressibility and flow on color Doppler imaging. Femoral Vein: No evidence of thrombus. Normal compressibility, respiratory phasicity and response to augmentation. Popliteal Vein: No evidence of thrombus. Normal compressibility, respiratory phasicity and response to augmentation. Calf Veins: No evidence of thrombus. Normal compressibility and flow  on color Doppler imaging. Superficial Great Saphenous Vein:  No evidence of thrombus. Normal compressibility. Other Findings:  None. IMPRESSION: No evidence of DVT within either lower extremity. Electronically Signed   By: Simonne Come M.D.   On: 06/09/2023 16:26

## 2023-06-09 NOTE — Telephone Encounter (Signed)
Pt scheduled with Dr. Patsy Lager at 1040am

## 2023-06-09 NOTE — Addendum Note (Signed)
Addended by: Abbe Amsterdam C on: 06/09/2023 06:05 PM   Modules accepted: Orders

## 2023-06-10 ENCOUNTER — Other Ambulatory Visit: Payer: Self-pay

## 2023-06-10 ENCOUNTER — Ambulatory Visit (HOSPITAL_BASED_OUTPATIENT_CLINIC_OR_DEPARTMENT_OTHER)
Admission: RE | Admit: 2023-06-10 | Discharge: 2023-06-10 | Disposition: A | Discharge status: Home / Self Care | Payer: PPO | Source: Ambulatory Visit | Attending: Family Medicine | Admitting: Family Medicine

## 2023-06-10 ENCOUNTER — Other Ambulatory Visit (HOSPITAL_BASED_OUTPATIENT_CLINIC_OR_DEPARTMENT_OTHER): Payer: Self-pay

## 2023-06-10 ENCOUNTER — Telehealth: Payer: Self-pay | Admitting: Family

## 2023-06-10 DIAGNOSIS — R600 Localized edema: Secondary | ICD-10-CM | POA: Diagnosis not present

## 2023-06-10 DIAGNOSIS — J811 Chronic pulmonary edema: Secondary | ICD-10-CM | POA: Diagnosis not present

## 2023-06-10 NOTE — Telephone Encounter (Signed)
I received a note from her Kidney doctor.  They would like pt to stop the diclofenac that Dr. Danielle Dess gave her due to her kidney function.

## 2023-06-10 NOTE — Telephone Encounter (Signed)
Patient notified of discussion and advise

## 2023-06-11 ENCOUNTER — Encounter: Payer: Self-pay | Admitting: Family Medicine

## 2023-06-11 ENCOUNTER — Encounter: Payer: Self-pay | Admitting: Urology

## 2023-06-11 ENCOUNTER — Ambulatory Visit (INDEPENDENT_AMBULATORY_CARE_PROVIDER_SITE_OTHER): Payer: PPO | Admitting: Urology

## 2023-06-11 VITALS — BP 130/83 | HR 97 | Ht 64.0 in | Wt 253.0 lb

## 2023-06-11 DIAGNOSIS — N1330 Unspecified hydronephrosis: Secondary | ICD-10-CM | POA: Diagnosis not present

## 2023-06-11 NOTE — Addendum Note (Signed)
Addended by: Lizbeth Bark on: 06/11/2023 04:35 PM   Modules accepted: Orders

## 2023-06-11 NOTE — Patient Instructions (Addendum)
It was good to see you again today! I am glad you are feeling better I will be in touch with your labs asap - I will also look out for your echo Once we get your labs back we will touch base about adjusting your lasix/ potassium

## 2023-06-11 NOTE — Progress Notes (Signed)
Assessment: 1. Hydronephrosis of right kidney     Plan: I personally reviewed the patient's chart including provider notes, labs and imaging results. Unfortunately, I am unable to view the imaging studies from [place]. I reviewed a CT scan from 7/23 which showed an apparent extrarenal pelvis on the right. She is currently asymptomatic without any flank pain and her renal function is improving.  I think it is likely that the right hydronephrosis may be due to an extrarenal pelvis or possibly a chronic UPJ obstruction.  Nevertheless, this does not appear to be clinically significant at the present time. Will get lasix renogram to evaluate for any obstructive process Will call her with results.  Chief Complaint:  Chief Complaint  Patient presents with   Hydronephrosis    History of Present Illness:  Jordan [name] is a 72 y.o. female who is seen in consultation from Sandford Craze, NP for evaluation of right hydronephrosis.   She presented to the emergency room in [facility] for abdominal pain and nausea.  She also had significant diarrhea.  She associated this with recent use of Mounjaro.  She was found to have acute kidney injury with a creatinine of 3.17.  She had a CT abdomen and pelvis without contrast at [facility] on 05/05/2023 for evaluation of abdominal pain and diarrhea.  The study showed moderate right hydronephrosis without obstructing ureteral stone. A renal ultrasound from 05/09/2023 again showed mild right hydronephrosis and a small right renal cyst.  She has no history of right-sided flank pain.  No history of kidney stones.  No gross hematuria or dysuria.  No recent UTIs.  Past Medical History:  Past Medical History:  Diagnosis Date   Acute hypoxemic respiratory failure due to COVID-19 (HCC) 10/08/2020   Cellulitis and abscess of finger, unspecified 401.9   Cellulitis and abscess of foot, except toes    Depressive disorder, not elsewhere classified    GERD  (gastroesophageal reflux disease)    Gout, unspecified    History of tobacco abuse 12/01/2018   Hyperglycemia 05/08/2011   Hyperlipidemia    borderline- not on meds   Hypertension    Insomnia    Leg edema    chronic   Obesity, unspecified    Osteoarthritis    Renal insufficiency, mild 05/08/2011   SBO (small bowel obstruction) (HCC) 12/01/2018   Small bowel obstruction (HCC)     Past Surgical History:  Past Surgical History:  Procedure Laterality Date   ABDOMINAL HYSTERECTOMY  2002   infection- no history of cancer   APPENDECTOMY  2002   catacact Bilateral 11/01/2019   CHOLECYSTECTOMY  1977   ECTOPIC PREGNANCY SURGERY  1971   1 tube removed   right shoulder rotator cuff repair  06/07/2010   TOTAL HIP ARTHROPLASTY Left 1998    Allergies:  Allergies  Allergen Reactions   Mounjaro [Tirzepatide]     Severe nausea/vomitting, diarrhea    Family History:  Family History  Problem Relation Age of Onset   Diabetes Mother    Cancer Mother        colon/ pancreatic   Colon cancer Mother 32   Hypertension Father    Alcohol abuse Father    Cirrhosis Father    Other Sister        Pyeoderma gangrenosis   Diabetes Sister        type 2   Kidney failure Sister    Liver disease Sister    Cirrhosis Sister  had NASH, died heart failure   Heart attack Brother    Stomach cancer Maternal Grandfather    Obesity Daughter    Rectal cancer Neg Hx    Esophageal cancer Neg Hx     Social History:  Social History   Tobacco Use   Smoking status: Former    Packs/day: .25    Types: Cigarettes    Quit date: 09/25/2020    Years since quitting: 2.7   Smokeless tobacco: Never  Vaping Use   Vaping Use: Never used  Substance Use Topics   Alcohol use: Not Currently   Drug use: No    Review of symptoms:  Constitutional:  Negative for unexplained weight loss, night sweats, fever, chills ENT:  Negative for nose bleeds, sinus pain, painful swallowing CV:  Negative for chest  pain, shortness of breath, exercise intolerance, palpitations, loss of consciousness Resp:  Negative for cough, wheezing, shortness of breath GI:  Negative for nausea, vomiting, diarrhea, bloody stools GU:  Positives noted in HPI; otherwise negative for gross hematuria, dysuria, urinary incontinence Neuro:  Negative for seizures, poor balance, limb weakness, slurred speech Psych:  Negative for lack of energy, depression, anxiety Endocrine:  Negative for polydipsia, polyuria, symptoms of hypoglycemia (dizziness, hunger, sweating) Hematologic:  Negative for anemia, purpura, petechia, prolonged or excessive bleeding, use of anticoagulants  Allergic:  Negative for difficulty breathing or choking as a result of exposure to anything; no shellfish allergy; no allergic response (rash/itch) to materials, foods  Physical exam: BP 130/83   Pulse 97   Ht 5\' 4"  (1.626 m)   Wt 253 lb (114.8 kg)   BMI 43.43 kg/m  GENERAL APPEARANCE:  Well appearing, well developed, well nourished, NAD HEENT: Atraumatic, Normocephalic, oropharynx clear. NECK: Supple without lymphadenopathy or thyromegaly. LUNGS: Clear to auscultation bilaterally. HEART: Regular Rate and Rhythm without murmurs, gallops, or rubs. ABDOMEN: Soft, non-tender, No Masses. EXTREMITIES: Moves all extremities well.  Without clubbing, cyanosis, or edema. NEUROLOGIC:  Alert and oriented x 3, normal gait, CN II-XII grossly intact.  MENTAL STATUS:  Appropriate. BACK:  Non-tender to palpation.  No CVAT SKIN:  Warm, dry and intact.    Results: U/A:  negative

## 2023-06-11 NOTE — Progress Notes (Addendum)
[facility] [address] [place], [place] [address] [phone]  Date:  06/16/2023   Name:  Jordan [name]   DOB:  [birth year]   MRN:  [id number]  PCP:  Jordan Craze, NP    Chief Complaint: follow up- Edema (Seen VV on 06/09/23 labs and Dvt study done and was negative. Pt says she has lost 2 more pounds. Her  legs still ache but the edema has improved. )   History of Present Illness:  Jordan [name] is a 72 y.o. very pleasant female patient who presents with the following:  Patient seen today for follow-up.  I saw her virtually on 6/17 with concern of lower extremity edema which started during her recent cruise to [place] Notes from that visit:  History of diabetes, degenerative spine disease, hyperlipidemia, renal insufficiency with most recent creatinine 5/24, 1.43, GFR 37 About 3 weeks prior to her recent trip she was in the hospital with acute renal insuf- she was admitted 5/13 at a [facility] facility through 5/17 with AKI- it looks like pre-renal distress caused by recent addition of Mounjaor and associated nausea and diarrhea  She came in for lab work, chest x-ray, Doppler lower extremities.  No DVT noted.  No pulmonary effusion but some atelectasis on chest x-ray.  Cardiac echo has been ordered Her BNP was mid range at 329  We had her monitoring daily weights, taking furosemide 1 or 2 daily with a companion dose of potassium 10 mill equivalents  She is down 14 lbs since last week- she got up to a max of 266 at home She had some minor swelling but never this much in the past  She is still taking 40 of lasix daily and 20 of K; she seems to be tolerating this well Her legs are much better but not yet back as baseline - they are still tender but improved She never felt SOB per se, but she does feel better and more energized now that her weight is back down to normal  Echo is scheduled for next month No prior history  of any heart failure diagnosis However she notes her sister does have CHF  Wt Readings from Last 3 Encounters:  06/16/23 250 lb 9.6 oz (113.7 kg)  06/11/23 253 lb (114.8 kg)  05/13/23 252 lb (114.3 kg)    Patient Active Problem List   Diagnosis Date Noted   Hydronephrosis of right kidney 05/14/2023   Anemia 04/24/2023   Nonintractable headache 04/01/2023   Spondylolisthesis at L4-L5 level 02/25/2023   Family history of rheumatoid arthritis 01/14/2023   Hypothyroidism 11/06/2022   AKI (acute kidney injury) (HCC) 07/11/2022   Diabetes type 2, controlled (HCC) 03/08/2022   Lumbar radiculopathy 11/09/2021   Cervical radiculopathy 11/09/2021   COVID-19 virus infection 08/08/2021   Chronic pain syndrome 06/26/2021   Morbidly obese (HCC) 10/09/2020   Hypokalemia 12/01/2018   History of tobacco abuse 12/01/2018   Benign paroxysmal positional vertigo 07/25/2015   Preventative health care 07/06/2012   GERD (gastroesophageal reflux disease) 01/03/2012   Renal insufficiency 05/08/2011   Gout 02/21/2010   Degenerative disc disease, lumbar 07/11/2009   Osteoarthritis 02/19/2009   Hyperlipidemia 01/16/2009   Obesity, Class III, BMI 40-49.9 (morbid obesity) (HCC) 02/17/2008   Depression 09/04/2007   Essential hypertension 09/04/2007    Past Medical History:  Diagnosis Date   Acute hypoxemic respiratory failure due to COVID-19 (HCC) 10/08/2020   Cellulitis and abscess  of finger, unspecified 401.9   Cellulitis and abscess of foot, except toes    Depressive disorder, not elsewhere classified    GERD (gastroesophageal reflux disease)    Gout, unspecified    History of tobacco abuse 12/01/2018   Hyperglycemia 05/08/2011   Hyperlipidemia    borderline- not on meds   Hypertension    Insomnia    Leg edema    chronic   Obesity, unspecified    Osteoarthritis    Renal insufficiency, mild 05/08/2011   SBO (small bowel obstruction) (HCC) 12/01/2018   Small bowel obstruction (HCC)      Past Surgical History:  Procedure Laterality Date   ABDOMINAL HYSTERECTOMY  2002   infection- no history of cancer   APPENDECTOMY  2002   catacact Bilateral 11/01/2019   CHOLECYSTECTOMY  1977   ECTOPIC PREGNANCY SURGERY  1971   1 tube removed   right shoulder rotator cuff repair  06/07/2010   TOTAL HIP ARTHROPLASTY Left 1998    Social History   Tobacco Use   Smoking status: Former    Packs/day: .25    Types: Cigarettes    Quit date: 09/25/2020    Years since quitting: 2.7   Smokeless tobacco: Never  Vaping Use   Vaping Use: Never used  Substance Use Topics   Alcohol use: Not Currently   Drug use: No    Family History  Problem Relation Age of Onset   Diabetes Mother    Cancer Mother        colon/ pancreatic   Colon cancer Mother 66   Hypertension Father    Alcohol abuse Father    Cirrhosis Father    Other Sister        Pyeoderma gangrenosis   Diabetes Sister        type 2   Kidney failure Sister    Liver disease Sister    Cirrhosis Sister        had NASH, died heart failure   Heart attack Brother    Stomach cancer Maternal Grandfather    Obesity Daughter    Rectal cancer Neg Hx    Esophageal cancer Neg Hx     Allergies  Allergen Reactions   Mounjaro [Tirzepatide]     Severe nausea/vomitting, diarrhea    Medication list has been reviewed and updated.  Current Outpatient Medications on File Prior to Visit  Medication Sig Dispense Refill   allopurinol (ZYLOPRIM) 100 MG tablet Take 2 tablets (200 mg total) by mouth daily. 180 tablet 1   aspirin EC 81 MG tablet Take 81 mg by mouth daily.     Biotin (BIOTIN 5000) 5 MG CAPS Take 5 mg by mouth daily.     Cholecalciferol (D3 2000) 50 MCG (2000 UT) CAPS Take 2,000 Units by mouth daily.     COVID-19 mRNA vaccine 2023-2024 (COMIRNATY) syringe Inject into the muscle. 0.3 mL 0   Cyanocobalamin (B-12) 5000 MCG CAPS Take 5,000 mcg by mouth daily.     DULoxetine (CYMBALTA) 60 MG capsule Take 1 capsule (60 mg  total) by mouth daily. 90 capsule 1   furosemide (LASIX) 20 MG tablet Take 1 tablet (20 mg total) by mouth in the morning and at noon as needed for leg swelling. 60 tablet 3   influenza vaccine adjuvanted (FLUAD) 0.5 ML injection Inject into the muscle. 0.5 mL 0   methocarbamol (ROBAXIN) 500 MG tablet Take 1 tablet (500 mg total) by mouth every 6 (six) hours as needed for muscle spasms.  40 tablet 3   Multiple Vitamin (MULTIVITAMIN WITH MINERALS) TABS tablet Take 1 tablet by mouth daily.     pantoprazole (PROTONIX) 40 MG tablet Take 1 tablet (40 mg total) by mouth daily. 90 tablet 1   potassium chloride (KLOR-CON M) 10 MEQ tablet Take 1 tablet (10 mEq total) by mouth daily for each furosemide dose. 60 tablet 2   scopolamine (TRANSDERM-SCOP) 1 MG/3DAYS Place 1 patch (1.5 mg total) onto the skin every 3 (three) days. (Patient not taking: Reported on 06/11/2023) 3 patch 0   [DISCONTINUED] famotidine (PEPCID AC) 10 MG chewable tablet Chew 10 mg by mouth daily.     No current facility-administered medications on file prior to visit.    Review of Systems:  As per HPI- otherwise negative  Physical Examination: Vitals:   06/16/23 1319  BP: 132/74  Pulse: 97  Resp: 18  Temp: 97.8 F (36.6 C)  SpO2: 98%   Vitals:   06/16/23 1319  Weight: 250 lb 9.6 oz (113.7 kg)  Height: 5\' 4"  (1.626 m)   Body mass index is 43.02 kg/m. Ideal Body Weight: Weight in (lb) to have BMI = 25: 145.3  GEN: no acute distress.  Obese, looks well HEENT: Atraumatic, Normocephalic.  Ears and Nose: No external deformity. CV: RRR, No M/G/R. No JVD. No thrill. No extra heart sounds. PULM: CTA B, no wheezes, crackles, rhonchi. No retractions. No resp. distress. No accessory muscle use. EXTR: No c/c/trace to 1+ edema is present in both feet and ankles, worse on the left.  Patient states her ankles are typically not swollen PSYCH: Normally interactive. Conversant.     Assessment and Plan: Lower extremity edema -  Plan: Basic metabolic panel, B Nat Peptide, B Nat Peptide, Basic metabolic panel  Renal insufficiency - Plan: CBC  Essential hypertension  Patient seen today for follow-up of lower extremity edema and fluid retention.  Her weight is down approximately 15 pounds since she returned from a cruise to [place] and started on furosemide.  No previous history of CHF diagnosis, echo is pending.  We wonder if she may have previously silent mild CHF, perhaps she was tipped over by dietary change on the cruise ship.  In any case, her symptoms are quite a bit better.  We will check lab work as above, may need to scale her back to furosemide 20 mg daily depending on results.  I will be in touch with her ASAP and we await her echo next month  Signed Abbe Amsterdam, MD  Received labs as below, message to patient Results for orders placed or performed in visit on 06/16/23  CBC  Result Value Ref Range   WBC 7.4 4.0 - 10.5 K/uL   RBC 4.11 3.87 - 5.11 Mil/uL   Platelets 320.0 150.0 - 400.0 K/uL   Hemoglobin 12.6 12.0 - 15.0 g/dL   HCT 00.9 38.1 - 82.9 %   MCV 95.3 78.0 - 100.0 fl   MCHC 32.0 30.0 - 36.0 g/dL   RDW 93.7 (H) 16.9 - 67.8 %  B Nat Peptide  Result Value Ref Range   Pro B Natriuretic peptide (BNP) 154.0 (H) 0.0 - 100.0 pg/mL  Basic metabolic panel  Result Value Ref Range   Sodium 142 135 - 145 mEq/L   Potassium 4.1 3.5 - 5.1 mEq/L   Chloride 106 96 - 112 mEq/L   CO2 25 19 - 32 mEq/L   Glucose, Bld 118 (H) 70 - 99 mg/dL   BUN 15 6 -  23 mg/dL   Creatinine, Ser 2.53 (H) 0.40 - 1.20 mg/dL   GFR 66.44 (L) >03.47 mL/min   Calcium 9.9 8.4 - 10.5 mg/dL

## 2023-06-12 LAB — URINALYSIS, ROUTINE W REFLEX MICROSCOPIC
Bilirubin, UA: NEGATIVE
Glucose, UA: NEGATIVE
Ketones, UA: NEGATIVE
Leukocytes,UA: NEGATIVE
Nitrite, UA: NEGATIVE
Protein,UA: NEGATIVE
RBC, UA: NEGATIVE
Specific Gravity, UA: 1.015 (ref 1.005–1.030)
Urobilinogen, Ur: 0.2 mg/dL (ref 0.2–1.0)
pH, UA: 5.5 (ref 5.0–7.5)

## 2023-06-16 ENCOUNTER — Encounter: Payer: Self-pay | Admitting: Family Medicine

## 2023-06-16 ENCOUNTER — Ambulatory Visit (INDEPENDENT_AMBULATORY_CARE_PROVIDER_SITE_OTHER): Payer: PPO | Admitting: Family Medicine

## 2023-06-16 VITALS — BP 132/74 | HR 97 | Temp 97.8°F | Resp 18 | Ht 64.0 in | Wt 250.6 lb

## 2023-06-16 DIAGNOSIS — I10 Essential (primary) hypertension: Secondary | ICD-10-CM

## 2023-06-16 DIAGNOSIS — R600 Localized edema: Secondary | ICD-10-CM | POA: Diagnosis not present

## 2023-06-16 DIAGNOSIS — N289 Disorder of kidney and ureter, unspecified: Secondary | ICD-10-CM | POA: Diagnosis not present

## 2023-06-16 LAB — CBC
HCT: 39.2 % (ref 36.0–46.0)
Hemoglobin: 12.6 g/dL (ref 12.0–15.0)
MCHC: 32 g/dL (ref 30.0–36.0)
MCV: 95.3 fl (ref 78.0–100.0)
Platelets: 320 10*3/uL (ref 150.0–400.0)
RBC: 4.11 Mil/uL (ref 3.87–5.11)
RDW: 16.2 % — ABNORMAL HIGH (ref 11.5–15.5)
WBC: 7.4 10*3/uL (ref 4.0–10.5)

## 2023-06-16 LAB — BASIC METABOLIC PANEL
BUN: 15 mg/dL (ref 6–23)
CO2: 25 mEq/L (ref 19–32)
Calcium: 9.9 mg/dL (ref 8.4–10.5)
Chloride: 106 mEq/L (ref 96–112)
Creatinine, Ser: 1.21 mg/dL — ABNORMAL HIGH (ref 0.40–1.20)
GFR: 44.71 mL/min — ABNORMAL LOW (ref >60.00)
Glucose, Bld: 118 mg/dL — ABNORMAL HIGH (ref 70–99)
Potassium: 4.1 mEq/L (ref 3.5–5.1)
Sodium: 142 mEq/L (ref 135–145)

## 2023-06-16 LAB — BRAIN NATRIURETIC PEPTIDE: Pro B Natriuretic peptide (BNP): 154 pg/mL — ABNORMAL HIGH (ref 0.0–100.0)

## 2023-06-24 ENCOUNTER — Ambulatory Visit: Payer: PPO | Admitting: Family

## 2023-06-24 ENCOUNTER — Other Ambulatory Visit (HOSPITAL_BASED_OUTPATIENT_CLINIC_OR_DEPARTMENT_OTHER): Payer: Self-pay

## 2023-06-24 DIAGNOSIS — I129 Hypertensive chronic kidney disease with stage 1 through stage 4 chronic kidney disease, or unspecified chronic kidney disease: Secondary | ICD-10-CM | POA: Diagnosis not present

## 2023-06-24 DIAGNOSIS — E1122 Type 2 diabetes mellitus with diabetic chronic kidney disease: Secondary | ICD-10-CM | POA: Diagnosis not present

## 2023-06-24 DIAGNOSIS — N1330 Unspecified hydronephrosis: Secondary | ICD-10-CM | POA: Diagnosis not present

## 2023-06-24 DIAGNOSIS — N1831 Chronic kidney disease, stage 3a: Secondary | ICD-10-CM | POA: Diagnosis not present

## 2023-06-24 DIAGNOSIS — N1832 Chronic kidney disease, stage 3b: Secondary | ICD-10-CM | POA: Diagnosis not present

## 2023-06-24 MED ORDER — LISINOPRIL 10 MG PO TABS
10.0000 mg | ORAL_TABLET | Freq: Every day | ORAL | 11 refills | Status: DC
  Filled 2023-06-24: qty 30, 30d supply, fill #0
  Filled 2023-07-27: qty 30, 30d supply, fill #1
  Filled 2023-08-19: qty 30, 30d supply, fill #2
  Filled 2023-09-05 – 2023-09-15 (×2): qty 30, 30d supply, fill #3
  Filled 2023-10-10: qty 30, 30d supply, fill #4
  Filled 2023-11-14: qty 30, 30d supply, fill #5
  Filled 2023-12-25: qty 30, 30d supply, fill #6
  Filled 2024-01-20: qty 30, 30d supply, fill #7
  Filled 2024-03-04: qty 30, 30d supply, fill #8
  Filled 2024-03-28: qty 30, 30d supply, fill #9

## 2023-07-01 ENCOUNTER — Ambulatory Visit (HOSPITAL_BASED_OUTPATIENT_CLINIC_OR_DEPARTMENT_OTHER)
Admission: RE | Admit: 2023-07-01 | Discharge: 2023-07-01 | Disposition: A | Discharge status: Home / Self Care | Payer: PPO | Source: Ambulatory Visit | Attending: Family Medicine | Admitting: Family Medicine

## 2023-07-01 DIAGNOSIS — R600 Localized edema: Secondary | ICD-10-CM | POA: Diagnosis not present

## 2023-07-01 LAB — ECHOCARDIOGRAM COMPLETE
Area-P 1/2: 2.66 cm2
P 1/2 time: 512 msec
S' Lateral: 3.4 cm

## 2023-07-03 ENCOUNTER — Encounter: Payer: Self-pay | Admitting: Family Medicine

## 2023-07-07 ENCOUNTER — Ambulatory Visit (HOSPITAL_COMMUNITY)
Admission: RE | Admit: 2023-07-07 | Discharge: 2023-07-07 | Disposition: A | Discharge status: Home / Self Care | Payer: PPO | Source: Ambulatory Visit | Attending: Urology | Admitting: Urology

## 2023-07-07 DIAGNOSIS — N2889 Other specified disorders of kidney and ureter: Secondary | ICD-10-CM | POA: Diagnosis not present

## 2023-07-07 DIAGNOSIS — N1330 Unspecified hydronephrosis: Secondary | ICD-10-CM | POA: Diagnosis not present

## 2023-07-07 MED ORDER — FUROSEMIDE 10 MG/ML IJ SOLN: 57.0000 mg | Freq: Once | INTRAMUSCULAR | Status: DC

## 2023-07-07 MED ORDER — TECHNETIUM TC 99M MERTIATIDE
5.0100 | Freq: Once | INTRAVENOUS | Status: AC
  Administered 2023-07-07: 5.01 via INTRAVENOUS

## 2023-07-07 MED ORDER — FUROSEMIDE 10 MG/ML IJ SOLN
INTRAMUSCULAR | Status: AC
  Filled 2023-07-07: qty 8

## 2023-07-11 ENCOUNTER — Other Ambulatory Visit (HOSPITAL_BASED_OUTPATIENT_CLINIC_OR_DEPARTMENT_OTHER): Payer: Self-pay

## 2023-07-22 ENCOUNTER — Ambulatory Visit: Payer: PPO | Admitting: Family

## 2023-07-27 ENCOUNTER — Other Ambulatory Visit (HOSPITAL_BASED_OUTPATIENT_CLINIC_OR_DEPARTMENT_OTHER): Payer: Self-pay

## 2023-07-28 ENCOUNTER — Ambulatory Visit: Payer: PPO | Admitting: Family

## 2023-07-29 ENCOUNTER — Ambulatory Visit (INDEPENDENT_AMBULATORY_CARE_PROVIDER_SITE_OTHER): Payer: PPO | Admitting: Family

## 2023-07-29 VITALS — BP 114/65 | HR 71 | Temp 97.7°F | Resp 16 | Wt 256.0 lb

## 2023-07-29 DIAGNOSIS — I10 Essential (primary) hypertension: Secondary | ICD-10-CM | POA: Diagnosis not present

## 2023-07-29 DIAGNOSIS — N1832 Chronic kidney disease, stage 3b: Secondary | ICD-10-CM | POA: Diagnosis not present

## 2023-07-29 DIAGNOSIS — I5189 Other ill-defined heart diseases: Secondary | ICD-10-CM | POA: Diagnosis not present

## 2023-07-29 NOTE — Progress Notes (Signed)
Subjective:     Patient ID: Jordan [name], female    DOB: 05-Nov-1951, 72 y.o.   MRN: [id number]  Chief Complaint  Patient presents with   Leg Swelling    Follow up, still having some swelling on both legs    HPI  Discussed the use of AI scribe software for clinical note transcription with the patient, who gave verbal consent to proceed.  History of Present Illness    The patient, with a history of hypertension, presents for a follow-up visit for fluid overload/lower extremity edema. She had been on a cruise and experienced significant lower extremity swelling, to the point where she could hardly put on her shoes. She describes her legs as feeling like they were going to explode. Despite the discomfort, she continued with her activities.  Upon returning from the cruise, she was started on Lasix 20 mg twice daily, which resulted in a significant diuresis and a weight loss of 20 pounds in one week. She has since been able to reduce the Lasix to 20 mg daily. 2D echo performed on 6/17 noted grade 1 diastolic dysfunction.   She also reports a recent diagnosis of stage 3 kidney disease, which is being managed by nephrology. She is maintained on Lisinopril. She was taken off this medication during her hospital stay when she had acute renal failure/dehydration back in May, but has since restarted it on the advice of her nephrologist.  She also has a history of diabetes, which appears to be well controlled with a recent HbA1c of 5.9%.       Wt Readings from Last 3 Encounters:  07/29/23 256 lb (116.1 kg)  06/16/23 250 lb 9.6 oz (113.7 kg)  06/11/23 253 lb (114.8 kg)      Health Maintenance Due  Topic Date Due   COVID-19 Vaccine (4 - 2023-24 season) 02/26/2023   INFLUENZA VACCINE  07/24/2023   OPHTHALMOLOGY EXAM  07/26/2023   Medicare Annual Wellness (AWV)  08/31/2023    Past Medical History:  Diagnosis Date   Acute hypoxemic respiratory failure due to COVID-19 (HCC) 10/08/2020    Cellulitis and abscess of finger, unspecified 401.9   Cellulitis and abscess of foot, except toes    Depressive disorder, not elsewhere classified    GERD (gastroesophageal reflux disease)    Gout, unspecified    History of tobacco abuse 12/01/2018   Hyperglycemia 05/08/2011   Hyperlipidemia    borderline- not on meds   Hypertension    Insomnia    Leg edema    chronic   Obesity, unspecified    Osteoarthritis    Renal insufficiency, mild 05/08/2011   SBO (small bowel obstruction) (HCC) 12/01/2018   Small bowel obstruction (HCC)     Past Surgical History:  Procedure Laterality Date   ABDOMINAL HYSTERECTOMY  2002   infection- no history of cancer   APPENDECTOMY  2002   catacact Bilateral 11/01/2019   CHOLECYSTECTOMY  1977   ECTOPIC PREGNANCY SURGERY  1971   1 tube removed   right shoulder rotator cuff repair  06/07/2010   TOTAL HIP ARTHROPLASTY Left 1998    Family History  Problem Relation Age of Onset   Diabetes Mother    Cancer Mother        colon/ pancreatic   Colon cancer Mother 35   Hypertension Father    Alcohol abuse Father    Cirrhosis Father    Other Sister        Pyeoderma gangrenosis   Diabetes  Sister        type 2   Kidney failure Sister    Liver disease Sister    Cirrhosis Sister        had NASH, died heart failure   Heart attack Brother    Stomach cancer Maternal Grandfather    Obesity Daughter    Rectal cancer Neg Hx    Esophageal cancer Neg Hx     Social History   Socioeconomic History   Marital status: Divorced    Spouse name: Not on file   Number of children: 2   Years of education: Not on file   Highest education level: Not on file  Occupational History   Occupation: works prn at [employer]    Employer: [place]  Tobacco Use   Smoking status: Former    Current packs/day: 0.00    Types: Cigarettes    Quit date: 09/25/2020    Years since quitting: 2.8   Smokeless tobacco: Never  Vaping Use   Vaping status: Never Used  Substance and  Sexual Activity   Alcohol use: Not Currently   Drug use: No   Sexual activity: Not Currently  Other Topics Concern   Not on file  Social History Narrative   2 children (daughter and son) both local. 3 grandchildren, one great grandchild   Works at ED front desk   Divorced (married x 30 years)   Social Determinants of Health   Financial Resource Strain: Low Risk  (07/16/2021)   Overall Financial Resource Strain (CARDIA)    Difficulty of Paying Living Expenses: Not hard at all  Food Insecurity: No Food Insecurity (05/12/2023)   Hunger Vital Sign    Worried About Running Out of Food in the Last Year: Never true    Ran Out of Food in the Last Year: Never true  Transportation Needs: No Transportation Needs (05/12/2023)   PRAPARE - Administrator, Civil Service (Medical): No    Lack of Transportation (Non-Medical): No  Physical Activity: Insufficiently Active (07/16/2021)   Exercise Vital Sign    Days of Exercise per Week: 3 days    Minutes of Exercise per Session: 30 min  Stress: No Stress Concern Present (05/06/2023)   Received from [facility], [facility] - Occupational Stress Questionnaire    Feeling of Stress : Not at all  Social Connections: Unknown (05/05/2023)   Received from [facility], [facility]   Social Network    Social Network: Not on file  Intimate Partner Violence: Not At Risk (05/05/2023)   Received from [facility], [facility]   HITS    Over the last 12 months how often did your partner physically hurt you?: 1    Over the last 12 months how often did your partner insult you or talk down to you?: 1    Over the last 12 months how often did your partner threaten you with physical harm?: 1    Over the last 12 months how often did your partner scream or curse at you?: 1    Outpatient Medications Prior to Visit  Medication Sig Dispense Refill   allopurinol (ZYLOPRIM) 100 MG tablet Take 2 tablets (200  mg total) by mouth daily. 180 tablet 1   aspirin EC 81 MG tablet Take 81 mg by mouth daily.     Biotin (BIOTIN 5000) 5 MG CAPS Take 5 mg by mouth daily.     Cholecalciferol (D3 2000) 50 MCG (  2000 UT) CAPS Take 2,000 Units by mouth daily.     COVID-19 mRNA vaccine 2023-2024 (COMIRNATY) syringe Inject into the muscle. 0.3 mL 0   Cyanocobalamin (B-12) 5000 MCG CAPS Take 5,000 mcg by mouth daily.     DULoxetine (CYMBALTA) 60 MG capsule Take 1 capsule (60 mg total) by mouth daily. 90 capsule 1   furosemide (LASIX) 20 MG tablet Take 1 tablet (20 mg total) by mouth in the morning and at noon as needed for leg swelling. 60 tablet 3   influenza vaccine adjuvanted (FLUAD) 0.5 ML injection Inject into the muscle. 0.5 mL 0   lisinopril (ZESTRIL) 10 MG tablet Take 1 tablet (10 mg total) by mouth daily. 30 tablet 11   methocarbamol (ROBAXIN) 500 MG tablet Take 1 tablet (500 mg total) by mouth every 6 (six) hours as needed for muscle spasms. 40 tablet 3   Multiple Vitamin (MULTIVITAMIN WITH MINERALS) TABS tablet Take 1 tablet by mouth daily.     pantoprazole (PROTONIX) 40 MG tablet Take 1 tablet (40 mg total) by mouth daily. 90 tablet 1   potassium chloride (KLOR-CON M) 10 MEQ tablet Take 1 tablet (10 mEq total) by mouth daily for each furosemide dose. 60 tablet 2   scopolamine (TRANSDERM-SCOP) 1 MG/3DAYS Place 1 patch (1.5 mg total) onto the skin every 3 (three) days. (Patient not taking: Reported on 06/11/2023) 3 patch 0   No facility-administered medications prior to visit.    Allergies  Allergen Reactions   Mounjaro [Tirzepatide]     Severe nausea/vomitting, diarrhea    ROS    See HPI Objective:    Physical Exam Constitutional:      General: She is not in acute distress.    Appearance: Normal appearance. She is well-developed.  HENT:     Head: Normocephalic and atraumatic.     Right Ear: External ear normal.     Left Ear: External ear normal.  Eyes:     General: No scleral icterus. Neck:      Thyroid: No thyromegaly.  Cardiovascular:     Rate and Rhythm: Normal rate and regular rhythm.     Heart sounds: Normal heart sounds. No murmur heard. Pulmonary:     Effort: Pulmonary effort is normal. No respiratory distress.     Breath sounds: Normal breath sounds. No wheezing.  Musculoskeletal:        General: Swelling (2+ bilateral LE edema) present.     Cervical back: Neck supple.  Skin:    General: Skin is warm and dry.  Neurological:     Mental Status: She is alert and oriented to person, place, and time.  Psychiatric:        Mood and Affect: Mood normal.        Behavior: Behavior normal.        Thought Content: Thought content normal.        Judgment: Judgment normal.      BP 114/65 (BP Location: Right Arm, Patient Position: Sitting, Cuff Size: Small)   Pulse 71   Temp 97.7 F (36.5 C) (Oral)   Resp 16   Wt 256 lb (116.1 kg)   SpO2 98%   BMI 43.94 kg/m  Wt Readings from Last 3 Encounters:  07/29/23 256 lb (116.1 kg)  06/16/23 250 lb 9.6 oz (113.7 kg)  06/11/23 253 lb (114.8 kg)       Assessment & Plan:   Problem List Items Addressed This Visit       Unprioritized  Essential hypertension    Well controlled on current regimen. -Continue current antihypertensive regimen.      Diastolic dysfunction - Primary    Likely secondary to history of hypertension. Recent echocardiogram showed mild diastolic dysfunction. Patient experienced significant fluid retention while on a cruise, but has since improved with increased Lasix dose. -Continue Lasix 20mg  daily. -Monitor for signs of fluid retention.      CKD stage 3b, GFR 30-44 ml/min (HCC)    Stable kidney function on recent labs. Hypertension and diabetes well controlled, which are key to maintaining kidney health. -Continue Lisinopril for blood pressure control and kidney protection. -Avoid anti-inflammatories. -Continue good blood pressure control and diabetes management.       I have  discontinued Ezzard Standing. Weltman's scopolamine. I am also having her maintain her aspirin EC, multivitamin with minerals, influenza vaccine adjuvanted, Comirnaty, B-12, D3 2000, Biotin, methocarbamol, pantoprazole, DULoxetine, allopurinol, potassium chloride, furosemide, and lisinopril.  No orders of the defined types were placed in this encounter.

## 2023-07-29 NOTE — Assessment & Plan Note (Signed)
Well controlled on current regimen. -Continue current antihypertensive regimen.

## 2023-07-29 NOTE — Assessment & Plan Note (Signed)
Likely secondary to history of hypertension. Recent echocardiogram showed mild diastolic dysfunction. Patient experienced significant fluid retention while on a cruise, but has since improved with increased Lasix dose. -Continue Lasix 20mg  daily. -Monitor for signs of fluid retention.

## 2023-07-29 NOTE — Assessment & Plan Note (Signed)
Stable kidney function on recent labs. Hypertension and diabetes well controlled, which are key to maintaining kidney health. -Continue Lisinopril for blood pressure control and kidney protection. -Avoid anti-inflammatories. -Continue good blood pressure control and diabetes management.

## 2023-07-29 NOTE — Patient Instructions (Signed)
VISIT SUMMARY:  During your recent visit, we discussed your recent hospitalization for fluid overload, which occurred after your cruise. We also talked about your hypertension, stage 3 kidney disease, and diabetes. You've been managing these conditions with various medications, including Lasix for fluid retention, Lisinopril for kidney protection and blood pressure control, and your current diabetes management plan.  YOUR PLAN:  -MILD DIASTOLIC DYSFUNCTION: This is a condition where your heart doesn't fill with blood properly. You experienced fluid retention while on a cruise, but this has improved with Lasix. Continue taking Lasix 20mg  daily and watch for signs of fluid retention.  -STAGE 3 CHRONIC KIDNEY DISEASE: This means your kidneys are not working as well as they should. Your kidney function is stable on recent labs. Continue taking Lisinopril for blood pressure control and kidney protection, avoid anti-inflammatories, and maintain good blood pressure control and diabetes management.  -HYPERTENSION: This is high blood pressure, which is well controlled on your current regimen. Continue your current antihypertensive regimen.  -DIABETES: This is a condition that affects how your body uses blood sugar. Your blood sugar control has improved with a recent A1c of 5.9%. Continue your current diabetes management plan.  INSTRUCTIONS:  Please follow up in 3 months to reassess your kidney function, blood pressure, and diabetes control.

## 2023-08-07 ENCOUNTER — Encounter (INDEPENDENT_AMBULATORY_CARE_PROVIDER_SITE_OTHER): Payer: Self-pay

## 2023-08-27 ENCOUNTER — Other Ambulatory Visit (HOSPITAL_BASED_OUTPATIENT_CLINIC_OR_DEPARTMENT_OTHER): Payer: Self-pay

## 2023-08-27 DIAGNOSIS — Z6841 Body Mass Index (BMI) 40.0 and over, adult: Secondary | ICD-10-CM | POA: Diagnosis not present

## 2023-08-27 DIAGNOSIS — M4316 Spondylolisthesis, lumbar region: Secondary | ICD-10-CM | POA: Diagnosis not present

## 2023-08-27 MED ORDER — METHOCARBAMOL 500 MG PO TABS
500.0000 mg | ORAL_TABLET | Freq: Two times a day (BID) | ORAL | 10 refills | Status: DC
  Filled 2023-08-27: qty 30, 15d supply, fill #0
  Filled 2023-10-10: qty 30, 15d supply, fill #1
  Filled 2023-11-03: qty 30, 15d supply, fill #2
  Filled 2023-12-08: qty 30, 15d supply, fill #3
  Filled 2023-12-25: qty 30, 15d supply, fill #4
  Filled 2024-02-06 (×2): qty 30, 15d supply, fill #5
  Filled 2024-03-04: qty 30, 15d supply, fill #6

## 2023-09-02 ENCOUNTER — Ambulatory Visit (INDEPENDENT_AMBULATORY_CARE_PROVIDER_SITE_OTHER): Payer: PPO | Admitting: *Deleted

## 2023-09-02 DIAGNOSIS — Z1231 Encounter for screening mammogram for malignant neoplasm of breast: Secondary | ICD-10-CM | POA: Diagnosis not present

## 2023-09-02 DIAGNOSIS — Z0000 Encounter for general adult medical examination without abnormal findings: Secondary | ICD-10-CM

## 2023-09-02 NOTE — Progress Notes (Signed)
Subjective:   Jordan [name] is a 72 y.o. female who presents for Medicare Annual (Subsequent) preventive examination.  Visit Complete: Virtual  I connected with  Jordan [name] on 09/02/23 by a audio enabled telemedicine application and verified that I am speaking with the correct person using two identifiers.  Patient Location: Home  Provider Location: Office/Clinic  I discussed the limitations of evaluation and management by telemedicine. The patient expressed understanding and agreed to proceed.    Review of Systems     Cardiac Risk Factors include: advanced age (>58men, >83 women);diabetes mellitus;dyslipidemia;hypertension;obesity (BMI >30kg/m2)     Objective:    Vital Signs: Unable to obtain new vitals due to this being a telehealth visit.      09/02/2023    1:43 PM 02/20/2023   11:27 AM 08/30/2022    1:06 PM 07/11/2022    8:51 PM 07/11/2022    5:11 PM 01/29/2022    8:05 AM 07/16/2021    8:27 AM  Advanced Directives  Does Patient Have a Medical Advance Directive? Yes Yes Yes Yes No Yes Yes  Type of Estate agent of [employer] Power of [place];Living will Healthcare Power of [place];Living will Living will  Healthcare Power of [place];Living will Healthcare Power of [place];Living will  Does patient want to make changes to medical advance directive? No - Patient declined No - Patient declined No - Patient declined No - Patient declined  No - Patient declined   Copy of Healthcare Power of Attorney in Chart? Yes - validated most recent copy scanned in chart (See row information) Yes - validated most recent copy scanned in chart (See row information) Yes - validated most recent copy scanned in chart (See row information)   Yes - validated most recent copy scanned in chart (See row information) Yes - validated most recent copy scanned in chart (See row information)  Would patient like information on creating a medical advance directive?    No -  Patient declined No - Patient declined      Current Medications (verified) Outpatient Encounter Medications as of 09/02/2023  Medication Sig   allopurinol (ZYLOPRIM) 100 MG tablet Take 2 tablets (200 mg total) by mouth daily.   aspirin EC 81 MG tablet Take 81 mg by mouth daily.   Biotin (BIOTIN 5000) 5 MG CAPS Take 5 mg by mouth daily.   Cholecalciferol (D3 2000) 50 MCG (2000 UT) CAPS Take 2,000 Units by mouth daily.   COVID-19 mRNA vaccine 2023-2024 (COMIRNATY) syringe Inject into the muscle.   Cyanocobalamin (B-12) 5000 MCG CAPS Take 5,000 mcg by mouth daily.   DULoxetine (CYMBALTA) 60 MG capsule Take 1 capsule (60 mg total) by mouth daily.   furosemide (LASIX) 20 MG tablet Take 1 tablet (20 mg total) by mouth in the morning and at noon as needed for leg swelling.   lisinopril (ZESTRIL) 10 MG tablet Take 1 tablet (10 mg total) by mouth daily.   methocarbamol (ROBAXIN) 500 MG tablet Take 1 tablet (500 mg total) by mouth every 12 (twelve) hours.   Multiple Vitamin (MULTIVITAMIN WITH MINERALS) TABS tablet Take 1 tablet by mouth daily.   pantoprazole (PROTONIX) 40 MG tablet Take 1 tablet (40 mg total) by mouth daily.   [DISCONTINUED] famotidine (PEPCID AC) 10 MG chewable tablet Chew 10 mg by mouth daily.   [DISCONTINUED] influenza vaccine adjuvanted (FLUAD) 0.5 ML injection Inject into the muscle.   [DISCONTINUED] methocarbamol (ROBAXIN) 500 MG tablet Take 1 tablet (500 mg total)  by mouth every 6 (six) hours as needed for muscle spasms.   [DISCONTINUED] potassium chloride (KLOR-CON M) 10 MEQ tablet Take 1 tablet (10 mEq total) by mouth daily for each furosemide dose.   No facility-administered encounter medications on file as of 09/02/2023.    Allergies (verified) Mounjaro [tirzepatide]   History: Past Medical History:  Diagnosis Date   Acute hypoxemic respiratory failure due to COVID-19 (HCC) 10/08/2020   Cellulitis and abscess of finger, unspecified 401.9   Cellulitis and abscess of  foot, except toes    Depressive disorder, not elsewhere classified    GERD (gastroesophageal reflux disease)    Gout, unspecified    History of tobacco abuse 12/01/2018   Hyperglycemia 05/08/2011   Hyperlipidemia    borderline- not on meds   Hypertension    Insomnia    Leg edema    chronic   Obesity, unspecified    Osteoarthritis    Renal insufficiency, mild 05/08/2011   SBO (small bowel obstruction) (HCC) 12/01/2018   Small bowel obstruction (HCC)    Past Surgical History:  Procedure Laterality Date   ABDOMINAL HYSTERECTOMY  2002   infection- no history of cancer   APPENDECTOMY  2002   catacact Bilateral 11/01/2019   CHOLECYSTECTOMY  1977   ECTOPIC PREGNANCY SURGERY  1971   1 tube removed   right shoulder rotator cuff repair  06/07/2010   TOTAL HIP ARTHROPLASTY Left 1998   Family History  Problem Relation Age of Onset   Diabetes Mother    Cancer Mother        colon/ pancreatic   Colon cancer Mother 93   Hypertension Father    Alcohol abuse Father    Cirrhosis Father    Other Sister        Pyeoderma gangrenosis   Diabetes Sister        type 2   Kidney failure Sister    Liver disease Sister    Cirrhosis Sister        had NASH, died heart failure   Heart attack Brother    Stomach cancer Maternal Grandfather    Obesity Daughter    Rectal cancer Neg Hx    Esophageal cancer Neg Hx    Social History   Socioeconomic History   Marital status: Divorced    Spouse name: Not on file   Number of children: 2   Years of education: Not on file   Highest education level: Not on file  Occupational History   Occupation: works prn at [employer]    Employer: [employer]  Tobacco Use   Smoking status: Former    Current packs/day: 0.00    Types: Cigarettes    Quit date: 09/25/2020    Years since quitting: 2.9   Smokeless tobacco: Never  Vaping Use   Vaping status: Never Used  Substance and Sexual Activity   Alcohol use: Not Currently   Drug use: No   Sexual activity: Not  Currently  Other Topics Concern   Not on file  Social History Narrative   2 children (daughter and son) both local. 3 grandchildren, one great grandchild   Works at ED front desk   Divorced (married x 30 years)   Social Determinants of Health   Financial Resource Strain: Low Risk  (09/02/2023)   Overall Financial Resource Strain (CARDIA)    Difficulty of Paying Living Expenses: Not hard at all  Food Insecurity: No Food Insecurity (09/02/2023)   Hunger Vital Sign    Worried About  Running Out of Food in the Last Year: Never true    Ran Out of Food in the Last Year: Never true  Transportation Needs: No Transportation Needs (09/02/2023)   PRAPARE - Administrator, Civil Service (Medical): No    Lack of Transportation (Non-Medical): No  Physical Activity: Inactive (09/02/2023)   Exercise Vital Sign    Days of Exercise per Week: 0 days    Minutes of Exercise per Session: 0 min  Stress: No Stress Concern Present (09/02/2023)   [employer] of Occupational Health - Occupational Stress Questionnaire    Feeling of Stress : Not at all  Social Connections: Moderately Isolated (09/02/2023)   Social Connection and Isolation Panel [NHANES]    Frequency of Communication with Friends and Family: More than three times a week    Frequency of Social Gatherings with Friends and Family: More than three times a week    Attends Religious Services: More than 4 times per year    Active Member of [employer] or Organizations: No    Attends Engineer, structural: Never    Marital Status: Divorced    Tobacco Counseling Counseling given: Not Answered   Clinical Intake:  Pre-visit preparation completed: Yes  Pain : No/denies pain  Nutritional Risks: None Diabetes: Yes CBG done?: No Did pt. bring in CBG monitor from home?: No  How often do you need to have someone help you when you read instructions, pamphlets, or other written materials from your doctor or pharmacy?: 1 -  Never  Interpreter Needed?: No  Information entered by :: Donne Anon, CMA   Activities of Daily Living    09/02/2023    1:43 PM 02/20/2023   11:31 AM  In your present state of health, do you have any difficulty performing the following activities:  Hearing? 0   Vision? 0   Difficulty concentrating or making decisions? 0   Walking or climbing stairs? 1   Comment sometimes if she has been standing for long periods of time   Dressing or bathing? 0   Doing errands, shopping? 0 0  Preparing Food and eating ? N   Using the Toilet? N   In the past six months, have you accidently leaked urine? Y   Do you have problems with loss of bowel control? Y   Managing your Medications? N   Managing your Finances? N   Housekeeping or managing your Housekeeping? N     Patient Care Team: Sandford Craze, NP as PCP - General  Indicate any recent Medical Services you may have received from other than [facility] providers in the past year (date may be approximate).     Assessment:   This is a routine wellness examination for Jordan [name].  Hearing/Vision screen No results found.   Goals Addressed   None    Depression Screen    09/02/2023    2:00 PM 07/29/2023    7:31 AM 05/13/2023    3:55 PM 01/14/2023    9:16 AM 07/23/2022   11:27 AM 03/20/2022   10:47 AM 07/16/2021    8:30 AM  PHQ 2/9 Scores  PHQ - 2 Score 0 0 0 1 0 0 0  PHQ- 9 Score  0 0 1 0 2     Fall Risk    09/02/2023    1:42 PM 07/29/2023    7:31 AM 08/30/2022    1:07 PM 07/23/2022   11:27 AM 07/16/2021    8:28 AM  Fall Risk  Falls in the past year? 0 0 0 0 1  Number falls in past yr: 0 0 0 0 1  Injury with Fall? 0 0 0 0 0  Risk for fall due to : Orthopedic patient;Impaired balance/gait No Fall Risks No Fall Risks    Follow up Falls evaluation completed Falls evaluation completed Falls evaluation completed  Falls prevention discussed    MEDICARE RISK AT HOME: Medicare Risk at Home Any stairs in or around the home?: Yes If so,  are there any without handrails?: No Home free of loose throw rugs in walkways, pet beds, electrical cords, etc?: Yes Adequate lighting in your home to reduce risk of falls?: Yes Life alert?: No Use of a cane, walker or w/c?: Yes Grab bars in the bathroom?: Yes Shower chair or bench in shower?: Yes Elevated toilet seat or a handicapped toilet?: No  TIMED UP AND GO:  Was the test performed?  No    Cognitive Function:    01/07/2017   10:08 AM  MMSE - Mini Mental State Exam  Orientation to time 5  Orientation to Place 5  Registration 3  Attention/ Calculation 5  Recall 2  Language- name 2 objects 2  Language- repeat 1  Language- follow 3 step command 3  Language- read & follow direction 1  Write a sentence 1  Copy design 1  Total score 29        09/02/2023    2:06 PM 08/30/2022    1:24 PM  6CIT Screen  What Year? 0 points 0 points  What month? 0 points 0 points  What time? 0 points 0 points  Count back from 20 0 points 0 points  Months in reverse 0 points 4 points  Repeat phrase 0 points 2 points  Total Score 0 points 6 points    Immunizations Immunization History  Administered Date(s) Administered   COVID-19, mRNA, vaccine(Comirnaty)12 years and older 10/28/2022   Fluad Quad(high Dose 65+) 10/07/2019, 10/27/2020, 10/12/2021, 10/17/2022   Influenza Whole 10/12/2009   Influenza, High Dose Seasonal PF 09/02/2017, 09/07/2018   Influenza-Unspecified 09/21/2013, 08/23/2014, 09/22/2016   PFIZER(Purple Top)SARS-COV-2 Vaccination 07/26/2020, 08/16/2020   Pneumococcal Conjugate-13 09/04/2016   Pneumococcal Polysaccharide-23 12/23/2013, 10/07/2019   Td 12/23/2004   Tdap 08/11/2015   Zoster Recombinant(Shingrix) 06/26/2021, 11/01/2021   Zoster, Live 06/19/2011    TDAP status: Up to date  Flu Vaccine status: Due, Education has been provided regarding the importance of this vaccine. Advised may receive this vaccine at local pharmacy or Health Dept. Aware to provide a copy  of the vaccination record if obtained from local pharmacy or Health Dept. Verbalized acceptance and understanding.  Pneumococcal vaccine status: Up to date  Covid-19 vaccine status: Information provided on how to obtain vaccines.   Qualifies for Shingles Vaccine? Yes   Zostavax completed Yes   Shingrix Completed?: Yes  Screening Tests Health Maintenance  Topic Date Due   OPHTHALMOLOGY EXAM  07/26/2023   MAMMOGRAM  07/31/2023   COVID-19 Vaccine (4 - 2023-24 season) 08/24/2023   Medicare Annual Wellness (AWV)  08/31/2023   INFLUENZA VACCINE  03/22/2024 (Originally 07/24/2023)   HEMOGLOBIN A1C  10/01/2023   Diabetic kidney evaluation - Urine ACR  10/29/2023   Diabetic kidney evaluation - eGFR measurement  06/15/2024   DTaP/Tdap/Td (3 - Td or Tdap) 08/10/2025   Colonoscopy  09/21/2026   Pneumonia Vaccine 60+ Years old  Completed   DEXA SCAN  Completed   Hepatitis C Screening  Completed   Zoster Vaccines-  Shingrix  Completed   HPV VACCINES  Aged Out   FOOT EXAM  Discontinued    Health Maintenance  Health Maintenance Due  Topic Date Due   OPHTHALMOLOGY EXAM  07/26/2023   MAMMOGRAM  07/31/2023   COVID-19 Vaccine (4 - 2023-24 season) 08/24/2023   Medicare Annual Wellness (AWV)  08/31/2023    Colorectal cancer screening: Type of screening: Colonoscopy. Completed 09/21/21. Repeat every 5 years  Mammogram status: Ordered 09/02/23. Pt provided with contact info and advised to call to schedule appt.   Bone Density status: Completed 01/14/23. Results reflect: Bone density results: NORMAL. Repeat every 2 years.  Lung Cancer Screening: (Low Dose CT Chest recommended if Age 21-80 years, 20 pack-year currently smoking OR have quit w/in 15years.) does not qualify.   Additional Screening:  Hepatitis C Screening: does qualify; Completed 10/09/20  Vision Screening: Recommended annual ophthalmology exams for early detection of glaucoma and other disorders of the eye. Is the patient up to  date with their annual eye exam?  Yes  Who is the provider or what is the name of the office in which the patient attends annual eye exams? [facility] in [place] If pt is not established with a provider, would they like to be referred to a provider to establish care? No .   Dental Screening: Recommended annual dental exams for proper oral hygiene  Diabetic Foot Exam: Diabetic Foot Exam: Overdue, Pt has been advised about the importance in completing this exam. Pt is scheduled for diabetic foot exam on N/a.  Community Resource Referral / Chronic Care Management: CRR required this visit?  No   CCM required this visit?  No     Plan:     I have personally reviewed and noted the following in the patient's chart:   Medical and social history Use of alcohol, tobacco or illicit drugs  Current medications and supplements including opioid prescriptions. Patient is not currently taking opioid prescriptions. Functional ability and status Nutritional status Physical activity Advanced directives List of other physicians Hospitalizations, surgeries, and ER visits in previous 12 months Vitals Screenings to include cognitive, depression, and falls Referrals and appointments  In addition, I have reviewed and discussed with patient certain preventive protocols, quality metrics, and best practice recommendations. A written personalized care plan for preventive services as well as general preventive health recommendations were provided to patient.     Donne Anon, CMA   09/02/2023   After Visit Summary: (MyChart) Due to this being a telephonic visit, the after visit summary with patients personalized plan was offered to patient via MyChart   Nurse Notes: None

## 2023-09-02 NOTE — Patient Instructions (Signed)
Jordan [name] , Thank you for taking time to come for your Medicare Wellness Visit. I appreciate your ongoing commitment to your health goals. Please review the following plan we discussed and let me know if I can assist you in the future.     This is a list of the screening recommended for you and due dates:  Health Maintenance  Topic Date Due   Eye exam for diabetics  07/26/2023   Mammogram  07/31/2023   COVID-19 Vaccine (4 - 2023-24 season) 08/24/2023   Flu Shot  03/22/2024*   Hemoglobin A1C  10/01/2023   Yearly kidney health urinalysis for diabetes  10/29/2023   Yearly kidney function blood test for diabetes  06/15/2024   Medicare Annual Wellness Visit  09/01/2024   DTaP/Tdap/Td vaccine (3 - Td or Tdap) 08/10/2025   Colon Cancer Screening  09/21/2026   Pneumonia Vaccine  Completed   DEXA scan (bone density measurement)  Completed   Hepatitis C Screening  Completed   Zoster (Shingles) Vaccine  Completed   HPV Vaccine  Aged Out   Complete foot exam   Discontinued  *Topic was postponed. The date shown is not the original due date.    Next appointment: Follow up in one year for your annual wellness visit.   Preventive Care 41 Years and Older, Female Preventive care refers to lifestyle choices and visits with your health care provider that can promote health and wellness. What does preventive care include? A yearly physical exam. This is also called an annual well check. Dental exams once or twice a year. Routine eye exams. Ask your health care provider how often you should have your eyes checked. Personal lifestyle choices, including: Daily care of your teeth and gums. Regular physical activity. Eating a healthy diet. Avoiding tobacco and drug use. Limiting alcohol use. Practicing safe sex. Taking low-dose aspirin every day. Taking vitamin and mineral supplements as recommended by your health care provider. What happens during an annual well check? The services and  screenings done by your health care provider during your annual well check will depend on your age, overall health, lifestyle risk factors, and family history of disease. Counseling  Your health care provider may ask you questions about your: Alcohol use. Tobacco use. Drug use. Emotional well-being. Home and relationship well-being. Sexual activity. Eating habits. History of falls. Memory and ability to understand (cognition). Work and work Astronomer. Reproductive health. Screening  You may have the following tests or measurements: Height, weight, and BMI. Blood pressure. Lipid and cholesterol levels. These may be checked every 5 years, or more frequently if you are over 11 years old. Skin check. Lung cancer screening. You may have this screening every year starting at age 54 if you have a 30-pack-year history of smoking and currently smoke or have quit within the past 15 years. Fecal occult blood test (FOBT) of the stool. You may have this test every year starting at age 35. Flexible sigmoidoscopy or colonoscopy. You may have a sigmoidoscopy every 5 years or a colonoscopy every 10 years starting at age 51. Hepatitis C blood test. Hepatitis B blood test. Sexually transmitted disease (STD) testing. Diabetes screening. This is done by checking your blood sugar (glucose) after you have not eaten for a while (fasting). You may have this done every 1-3 years. Bone density scan. This is done to screen for osteoporosis. You may have this done starting at age 88. Mammogram. This may be done every 1-2 years. Talk to your health care  provider about how often you should have regular mammograms. Talk with your health care provider about your test results, treatment options, and if necessary, the need for more tests. Vaccines  Your health care provider may recommend certain vaccines, such as: Influenza vaccine. This is recommended every year. Tetanus, diphtheria, and acellular pertussis (Tdap,  Td) vaccine. You may need a Td booster every 10 years. Zoster vaccine. You may need this after age 40. Pneumococcal 13-valent conjugate (PCV13) vaccine. One dose is recommended after age 79. Pneumococcal polysaccharide (PPSV23) vaccine. One dose is recommended after age 35. Talk to your health care provider about which screenings and vaccines you need and how often you need them. This information is not intended to replace advice given to you by your health care provider. Make sure you discuss any questions you have with your health care provider. Document Released: 01/05/2016 Document Revised: 08/28/2016 Document Reviewed: 10/10/2015 Elsevier Interactive Patient Education  2017 [employer].  Fall Prevention in the Home Falls can cause injuries. They can happen to people of all ages. There are many things you can do to make your home safe and to help prevent falls. What can I do on the outside of my home? Regularly fix the edges of walkways and driveways and fix any cracks. Remove anything that might make you trip as you walk through a door, such as a raised step or threshold. Trim any bushes or trees on the path to your home. Use bright outdoor lighting. Clear any walking paths of anything that might make someone trip, such as rocks or tools. Regularly check to see if handrails are loose or broken. Make sure that both sides of any steps have handrails. Any raised decks and porches should have guardrails on the edges. Have any leaves, snow, or ice cleared regularly. Use sand or salt on walking paths during winter. Clean up any spills in your garage right away. This includes oil or grease spills. What can I do in the bathroom? Use night lights. Install grab bars by the toilet and in the tub and shower. Do not use towel bars as grab bars. Use non-skid mats or decals in the tub or shower. If you need to sit down in the shower, use a plastic, non-slip stool. Keep the floor dry. Clean up any  water that spills on the floor as soon as it happens. Remove soap buildup in the tub or shower regularly. Attach bath mats securely with double-sided non-slip rug tape. Do not have throw rugs and other things on the floor that can make you trip. What can I do in the bedroom? Use night lights. Make sure that you have a light by your bed that is easy to reach. Do not use any sheets or blankets that are too big for your bed. They should not hang down onto the floor. Have a firm chair that has side arms. You can use this for support while you get dressed. Do not have throw rugs and other things on the floor that can make you trip. What can I do in the kitchen? Clean up any spills right away. Avoid walking on wet floors. Keep items that you use a lot in easy-to-reach places. If you need to reach something above you, use a strong step stool that has a grab bar. Keep electrical cords out of the way. Do not use floor polish or wax that makes floors slippery. If you must use wax, use non-skid floor wax. Do not have throw rugs  and other things on the floor that can make you trip. What can I do with my stairs? Do not leave any items on the stairs. Make sure that there are handrails on both sides of the stairs and use them. Fix handrails that are broken or loose. Make sure that handrails are as long as the stairways. Check any carpeting to make sure that it is firmly attached to the stairs. Fix any carpet that is loose or worn. Avoid having throw rugs at the top or bottom of the stairs. If you do have throw rugs, attach them to the floor with carpet tape. Make sure that you have a light switch at the top of the stairs and the bottom of the stairs. If you do not have them, ask someone to add them for you. What else can I do to help prevent falls? Wear shoes that: Do not have high heels. Have rubber bottoms. Are comfortable and fit you well. Are closed at the toe. Do not wear sandals. If you use a  stepladder: Make sure that it is fully opened. Do not climb a closed stepladder. Make sure that both sides of the stepladder are locked into place. Ask someone to hold it for you, if possible. Clearly mark and make sure that you can see: Any grab bars or handrails. First and last steps. Where the edge of each step is. Use tools that help you move around (mobility aids) if they are needed. These include: Canes. Walkers. Scooters. Crutches. Turn on the lights when you go into a dark area. Replace any light bulbs as soon as they burn out. Set up your furniture so you have a clear path. Avoid moving your furniture around. If any of your floors are uneven, fix them. If there are any pets around you, be aware of where they are. Review your medicines with your doctor. Some medicines can make you feel dizzy. This can increase your chance of falling. Ask your doctor what other things that you can do to help prevent falls. This information is not intended to replace advice given to you by your health care provider. Make sure you discuss any questions you have with your health care provider. Document Released: 10/05/2009 Document Revised: 05/16/2016 Document Reviewed: 01/13/2015 Elsevier Interactive Patient Education  2017 [employer].

## 2023-09-05 ENCOUNTER — Other Ambulatory Visit: Payer: Self-pay

## 2023-09-05 ENCOUNTER — Other Ambulatory Visit: Payer: Self-pay | Admitting: Family

## 2023-09-05 ENCOUNTER — Other Ambulatory Visit (HOSPITAL_BASED_OUTPATIENT_CLINIC_OR_DEPARTMENT_OTHER): Payer: Self-pay

## 2023-09-05 MED ORDER — PANTOPRAZOLE SODIUM 40 MG PO TBEC
40.0000 mg | DELAYED_RELEASE_TABLET | Freq: Every day | ORAL | 1 refills | Status: DC
  Filled 2023-09-05 – 2023-11-03 (×2): qty 90, 90d supply, fill #0
  Filled 2024-02-05: qty 90, 90d supply, fill #1

## 2023-09-18 ENCOUNTER — Ambulatory Visit (HOSPITAL_BASED_OUTPATIENT_CLINIC_OR_DEPARTMENT_OTHER)
Admission: RE | Admit: 2023-09-18 | Discharge: 2023-09-18 | Disposition: A | Discharge status: Home / Self Care | Payer: PPO | Source: Ambulatory Visit | Attending: Family | Admitting: Family

## 2023-09-18 ENCOUNTER — Encounter (HOSPITAL_BASED_OUTPATIENT_CLINIC_OR_DEPARTMENT_OTHER): Payer: Self-pay

## 2023-09-18 DIAGNOSIS — Z1231 Encounter for screening mammogram for malignant neoplasm of breast: Secondary | ICD-10-CM | POA: Diagnosis not present

## 2023-09-19 ENCOUNTER — Other Ambulatory Visit (HOSPITAL_BASED_OUTPATIENT_CLINIC_OR_DEPARTMENT_OTHER): Payer: Self-pay

## 2023-09-19 MED ORDER — FLUAD 0.5 ML IM SUSY
PREFILLED_SYRINGE | INTRAMUSCULAR | 0 refills | Status: AC
  Filled 2023-09-19: qty 0.5, 1d supply, fill #0

## 2023-09-26 ENCOUNTER — Encounter: Payer: Self-pay | Admitting: Pharmacist

## 2023-09-26 NOTE — Progress Notes (Signed)
Pharmacy Quality Measure Review  This patient is appearing on a report for being at risk of failing the adherence measure for hypertension (ACEi/ARB) medications this calendar year.   Medication: lisinopril  Last fill date: 06/24/2023 for 30 day supply  Reviewed refill history - lisinopril filled for 30 DS on the following dates - 8/5, 8/29 and 9/26  Insurance report was not up to date. No action needed at this time.   Henrene Pastor, PharmD Clinical Pharmacist [facility]  Population Health [phone]

## 2023-10-03 ENCOUNTER — Other Ambulatory Visit (HOSPITAL_BASED_OUTPATIENT_CLINIC_OR_DEPARTMENT_OTHER): Payer: Self-pay

## 2023-11-03 ENCOUNTER — Other Ambulatory Visit (HOSPITAL_BASED_OUTPATIENT_CLINIC_OR_DEPARTMENT_OTHER): Payer: Self-pay

## 2023-11-14 ENCOUNTER — Other Ambulatory Visit: Payer: Self-pay

## 2023-11-14 ENCOUNTER — Other Ambulatory Visit (HOSPITAL_BASED_OUTPATIENT_CLINIC_OR_DEPARTMENT_OTHER): Payer: Self-pay

## 2023-11-14 ENCOUNTER — Encounter (HOSPITAL_BASED_OUTPATIENT_CLINIC_OR_DEPARTMENT_OTHER): Payer: Self-pay

## 2023-11-14 ENCOUNTER — Other Ambulatory Visit: Payer: Self-pay | Admitting: Family Medicine

## 2023-11-14 DIAGNOSIS — R600 Localized edema: Secondary | ICD-10-CM

## 2023-11-14 MED ORDER — FUROSEMIDE 20 MG PO TABS
20.0000 mg | ORAL_TABLET | Freq: Two times a day (BID) | ORAL | 0 refills | Status: DC
  Filled 2023-11-14: qty 60, 30d supply, fill #0

## 2023-11-17 ENCOUNTER — Other Ambulatory Visit (HOSPITAL_BASED_OUTPATIENT_CLINIC_OR_DEPARTMENT_OTHER): Payer: Self-pay

## 2023-12-08 ENCOUNTER — Other Ambulatory Visit: Payer: Self-pay | Admitting: Family

## 2023-12-08 DIAGNOSIS — R600 Localized edema: Secondary | ICD-10-CM

## 2023-12-09 ENCOUNTER — Other Ambulatory Visit (HOSPITAL_BASED_OUTPATIENT_CLINIC_OR_DEPARTMENT_OTHER): Payer: Self-pay

## 2023-12-09 ENCOUNTER — Other Ambulatory Visit: Payer: Self-pay

## 2023-12-09 MED ORDER — DULOXETINE HCL 60 MG PO CPEP
60.0000 mg | ORAL_CAPSULE | Freq: Every day | ORAL | 1 refills | Status: DC
  Filled 2023-12-09: qty 90, 90d supply, fill #0

## 2023-12-09 MED ORDER — ALLOPURINOL 100 MG PO TABS
200.0000 mg | ORAL_TABLET | Freq: Every day | ORAL | 1 refills | Status: DC
  Filled 2023-12-09: qty 180, 90d supply, fill #0
  Filled 2024-03-28: qty 180, 90d supply, fill #1

## 2023-12-09 MED ORDER — FUROSEMIDE 20 MG PO TABS
20.0000 mg | ORAL_TABLET | Freq: Two times a day (BID) | ORAL | 0 refills | Status: DC
Start: 2023-12-09 — End: 2024-05-01
  Filled 2023-12-09: qty 60, 30d supply, fill #0

## 2023-12-25 ENCOUNTER — Other Ambulatory Visit (HOSPITAL_BASED_OUTPATIENT_CLINIC_OR_DEPARTMENT_OTHER): Payer: Self-pay

## 2024-02-06 ENCOUNTER — Other Ambulatory Visit (HOSPITAL_BASED_OUTPATIENT_CLINIC_OR_DEPARTMENT_OTHER): Payer: Self-pay

## 2024-02-06 ENCOUNTER — Other Ambulatory Visit: Payer: Self-pay

## 2024-02-10 DIAGNOSIS — N1831 Chronic kidney disease, stage 3a: Secondary | ICD-10-CM | POA: Diagnosis not present

## 2024-03-08 ENCOUNTER — Encounter: Payer: Self-pay | Admitting: Family

## 2024-03-09 ENCOUNTER — Other Ambulatory Visit: Payer: Self-pay

## 2024-03-09 ENCOUNTER — Telehealth (INDEPENDENT_AMBULATORY_CARE_PROVIDER_SITE_OTHER): Admitting: Family

## 2024-03-09 ENCOUNTER — Other Ambulatory Visit (HOSPITAL_BASED_OUTPATIENT_CLINIC_OR_DEPARTMENT_OTHER): Payer: Self-pay

## 2024-03-09 DIAGNOSIS — N1832 Chronic kidney disease, stage 3b: Secondary | ICD-10-CM

## 2024-03-09 DIAGNOSIS — F320 Major depressive disorder, single episode, mild: Secondary | ICD-10-CM | POA: Diagnosis not present

## 2024-03-09 MED ORDER — DULOXETINE HCL 30 MG PO CPEP
30.0000 mg | ORAL_CAPSULE | Freq: Every day | ORAL | 1 refills | Status: DC
Start: 2024-03-09 — End: 2024-08-31
  Filled 2024-03-09: qty 90, 90d supply, fill #0
  Filled 2024-06-06: qty 90, 90d supply, fill #1

## 2024-03-09 MED ORDER — DULOXETINE HCL 60 MG PO CPEP
60.0000 mg | ORAL_CAPSULE | Freq: Every day | ORAL | 1 refills | Status: DC
  Filled 2024-03-09: qty 90, 90d supply, fill #0
  Filled 2024-06-06: qty 90, 90d supply, fill #1

## 2024-03-09 NOTE — Assessment & Plan Note (Signed)
 Cymbalta less effective, increased agitation and depressive symptoms. Previous medications caused weight gain. Wellbutrin discontinued due to anger issues. Preferred increasing Cymbalta over adding Wellbutrin.  - Increase Cymbalta to 90 mg daily by taking 60 mg plus 30 mg together once a day.  - Send prescriptions for both 60 mg and 30 mg Cymbalta to the pharmacy.  - Reassess in one month with an in-person follow-up appointment.

## 2024-03-09 NOTE — Assessment & Plan Note (Signed)
  Upcoming nephrology appointment to assess kidney function. - Follow up with the nephrologist on April 1st for kidney function assessment.

## 2024-03-09 NOTE — Patient Instructions (Signed)
 VISIT SUMMARY:  During today's visit, we discussed your concerns about the effectiveness of Cymbalta in managing your depression. We also reviewed your frequent styes and upcoming nephrology appointment for kidney function assessment.  YOUR PLAN:  -DEPRESSION: Depression is a mood disorder characterized by persistent feelings of sadness and loss of interest. We will increase your Cymbalta dosage to 90 mg daily by taking 60 mg plus 30 mg together once a day. Prescriptions for both dosages have been sent to your pharmacy. We will reassess your symptoms in one month with an in-person follow-up appointment.  -STYES: Styes are painful, red bumps near the edge of the eyelid caused by infected oil glands. You are managing this with a heated eye mask, which has been effective in reducing symptoms.  -KIDNEY FUNCTION FOLLOW-UP: You have an upcoming nephrology appointment to assess your kidney function. Please follow up with the nephrologist on April 1st for this assessment.  INSTRUCTIONS:  Please follow up with the nephrologist on April 1st for your kidney function assessment. Additionally, schedule an in-person follow-up appointment in one month to reassess the effectiveness of the increased Cymbalta dosage.

## 2024-03-09 NOTE — Progress Notes (Signed)
 MyChart Video Visit    Virtual Visit via Video Note    Patient location: Home. Patient and provider in visit Provider location: Office  I discussed the limitations of evaluation and management by telemedicine and the availability of in person appointments. The patient expressed understanding and agreed to proceed.  Visit Date: 03/09/2024  Today's healthcare provider: [name], NP     Subjective:    Patient ID: Jordan [name], female    DOB: [birth year], 73 y.o.   MRN: [id number]  Chief Complaint  Patient presents with   Depression    Patient reports increasing depression symptoms, on cymbalta    HPI Jordan [name] is a 73 year old female with depression who presents with concerns about Cymbalta not being effective. She has been taking Cymbalta daily for years, which was initially effective in managing her depression. Over the past three months, she feels it is no longer helping her manage stress and mood as it used to. She describes increased irritability, difficulty handling family and social stressors, and a change in her typically positive demeanor. She has isolated herself at home due to feeling like she is not a good person to be around. She recalls trying other medications in the past, such as Wellbutrin, which she stopped due to feeling it made her angry, and Prozac, which made her indifferent. She is concerned about potential weight gain with other medications, as she experienced this side effect about ten years ago with a different medication. She sometimes struggles with sleep and uses melatonin and occasionally a wine cooler to aid sleep. She has also tried a heated eye mask, which she finds relaxing and helpful for sleep. She has been experiencing frequent styes, and her eye doctor recommended the heated eye mask for this issue.      03/09/2024   10:51 AM 09/02/2023    2:00 PM 07/29/2023    7:31 AM  PHQ9 SCORE ONLY  PHQ-9 Total Score 16 0 0   Past Medical  History:  Diagnosis Date   Acute hypoxemic respiratory failure due to COVID-19 (HCC) 10/08/2020   Cellulitis and abscess of finger, unspecified 401.9   Cellulitis and abscess of foot, except toes    Depressive disorder, not elsewhere classified    GERD (gastroesophageal reflux disease)    Gout, unspecified    History of tobacco abuse 12/01/2018   Hyperglycemia 05/08/2011   Hyperlipidemia    borderline- not on meds   Hypertension    Insomnia    Leg edema    chronic   Obesity, unspecified    Osteoarthritis    Renal insufficiency, mild 05/08/2011   SBO (small bowel obstruction) (HCC) 12/01/2018   Small bowel obstruction (HCC)     Past Surgical History:  Procedure Laterality Date   ABDOMINAL HYSTERECTOMY  2002   infection- no history of cancer   APPENDECTOMY  2002   catacact Bilateral 11/01/2019   CHOLECYSTECTOMY  1977   ECTOPIC PREGNANCY SURGERY  1971   1 tube removed   right shoulder rotator cuff repair  06/07/2010   TOTAL HIP ARTHROPLASTY Left 1998    Family History  Problem Relation Age of Onset   Diabetes Mother    Cancer Mother        colon/ pancreatic   Colon cancer Mother 40   Hypertension Father    Alcohol abuse Father    Cirrhosis Father    Other Sister        Pyeoderma gangrenosis  Diabetes Sister        type 2   Kidney failure Sister    Liver disease Sister    Cirrhosis Sister        had NASH, died heart failure   Heart attack Brother    Stomach cancer Maternal Grandfather    Obesity Daughter    Rectal cancer Neg Hx    Esophageal cancer Neg Hx     Social History   Socioeconomic History   Marital status: Divorced    Spouse name: Not on file   Number of children: 2   Years of education: Not on file   Highest education level: Not on file  Occupational History   Occupation: works prn at [employer]    Employer: [employer]  Tobacco Use   Smoking status: Former    Current packs/day: 0.00    Types: Cigarettes    Quit date: 09/25/2020    Years since  quitting: 3.4   Smokeless tobacco: Never  Vaping Use   Vaping status: Never Used  Substance and Sexual Activity   Alcohol use: Not Currently   Drug use: No   Sexual activity: Not Currently  Other Topics Concern   Not on file  Social History Narrative   2 children (daughter and son) both local. 3 grandchildren, one great grandchild   Works at ED front desk   Divorced (married x 30 years)   Social Drivers of Corporate investment banker Strain: Low Risk  (09/02/2023)   Overall Financial Resource Strain (CARDIA)    Difficulty of Paying Living Expenses: Not hard at all  Food Insecurity: No Food Insecurity (09/02/2023)   Hunger Vital Sign    Worried About Running Out of Food in the Last Year: Never true    Ran Out of Food in the Last Year: Never true  Transportation Needs: No Transportation Needs (09/02/2023)   PRAPARE - Administrator, Civil Service (Medical): No    Lack of Transportation (Non-Medical): No  Physical Activity: Inactive (09/02/2023)   Exercise Vital Sign    Days of Exercise per Week: 0 days    Minutes of Exercise per Session: 0 min  Stress: No Stress Concern Present (09/02/2023)   [employer] of Occupational Health - Occupational Stress Questionnaire    Feeling of Stress : Not at all  Social Connections: Moderately Isolated (09/02/2023)   Social Connection and Isolation Panel [NHANES]    Frequency of Communication with Friends and Family: More than three times a week    Frequency of Social Gatherings with Friends and Family: More than three times a week    Attends Religious Services: More than 4 times per year    Active Member of [employer] or Organizations: No    Attends Banker Meetings: Never    Marital Status: Divorced  Catering manager Violence: Not At Risk (09/02/2023)   Humiliation, Afraid, Rape, and Kick questionnaire    Fear of Current or Ex-Partner: No    Emotionally Abused: No    Physically Abused: No    Sexually Abused: No     Outpatient Medications Prior to Visit  Medication Sig Dispense Refill   allopurinol (ZYLOPRIM) 100 MG tablet Take 2 tablets (200 mg total) by mouth daily. 180 tablet 1   aspirin EC 81 MG tablet Take 81 mg by mouth daily.     Biotin (BIOTIN 5000) 5 MG CAPS Take 5 mg by mouth daily.     Cholecalciferol (D3 2000) 50 MCG (2000  UT) CAPS Take 2,000 Units by mouth daily.     COVID-19 mRNA vaccine 2023-2024 (COMIRNATY) syringe Inject into the muscle. 0.3 mL 0   Cyanocobalamin (B-12) 5000 MCG CAPS Take 5,000 mcg by mouth daily.     furosemide (LASIX) 20 MG tablet Take 1 tablet (20 mg total) by mouth 2 (two) times daily as needed for swelling. 60 tablet 0   influenza vaccine adjuvanted (FLUAD) 0.5 ML injection Inject into the muscle. 0.5 mL 0   lisinopril (ZESTRIL) 10 MG tablet Take 1 tablet (10 mg total) by mouth daily. 30 tablet 11   methocarbamol (ROBAXIN) 500 MG tablet Take 1 tablet (500 mg total) by mouth every 12 (twelve) hours. 30 tablet 10   Multiple Vitamin (MULTIVITAMIN WITH MINERALS) TABS tablet Take 1 tablet by mouth daily.     pantoprazole (PROTONIX) 40 MG tablet Take 1 tablet (40 mg total) by mouth daily. 90 tablet 1   DULoxetine (CYMBALTA) 60 MG capsule Take 1 capsule (60 mg total) by mouth daily. 90 capsule 1   No facility-administered medications prior to visit.    Allergies  Allergen Reactions   Mounjaro [Tirzepatide]     Severe nausea/vomitting, diarrhea    ROS  See HPI     Objective:    Physical Exam  There were no vitals taken for this visit. Wt Readings from Last 3 Encounters:  07/29/23 256 lb (116.1 kg)  06/16/23 250 lb 9.6 oz (113.7 kg)  06/11/23 253 lb (114.8 kg)    Gen: Awake, alert, no acute distress Resp: Breathing is even and non-labored Psych: calm/pleasant demeanor- slightly flattened affect Neuro: Alert and Oriented x 3, + facial symmetry, speech is clear.     Assessment & Plan:   Problem List Items Addressed This Visit        Unprioritized   Depression - Primary   Cymbalta less effective, increased agitation and depressive symptoms. Previous medications caused weight gain. Wellbutrin discontinued due to anger issues. Preferred increasing Cymbalta over adding Wellbutrin.  - Increase Cymbalta to 90 mg daily by taking 60 mg plus 30 mg together once a day.  - Send prescriptions for both 60 mg and 30 mg Cymbalta to the pharmacy.  - Reassess in one month with an in-person follow-up appointment.       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 - Follow up with the nephrologist on April 1st for kidney function assessment.        I have changed Ezzard Standing. Depascale's DULoxetine. I am also having her start on DULoxetine. Additionally, I am having her maintain her aspirin EC, multivitamin with minerals, Comirnaty, B-12, D3 2000, Biotin, lisinopril, methocarbamol, pantoprazole, Fluad, allopurinol, and furosemide.  Meds ordered this encounter  Medications   DULoxetine (CYMBALTA) 30 MG capsule    Sig: Take 1 capsule (30 mg total) by mouth daily along with the 60 mg capsule for a total of 90 mg daily.    Dispense:  90 capsule    Refill:  1    Supervising Provider:   Danise Edge A [[id number]]   DULoxetine (CYMBALTA) 60 MG capsule    Sig: Take 1 capsule (60 mg total) by mouth daily along with the 30 mg capsule for a total of 90 mg daily.    Dispense:  90 capsule    Refill:  1  Supervising Provider:   Bradd Canary [phone]    I discussed the assessment and treatment plan with the patient. The patient was provided an opportunity to ask questions and all were answered. The patient agreed with the plan and demonstrated an understanding of the instructions.   The patient was advised to call back or seek an in-person evaluation if the symptoms worsen or if the condition fails  to improve as anticipated.   [name], NP [place]  [facility] at [facility] [phone] (phone) [phone] (fax)  [facility]

## 2024-04-21 ENCOUNTER — Ambulatory Visit (INDEPENDENT_AMBULATORY_CARE_PROVIDER_SITE_OTHER): Admitting: Family

## 2024-04-21 VITALS — BP 163/74 | HR 79 | Temp 97.7°F | Resp 16 | Ht 64.0 in | Wt 241.0 lb

## 2024-04-21 DIAGNOSIS — F320 Major depressive disorder, single episode, mild: Secondary | ICD-10-CM | POA: Diagnosis not present

## 2024-04-21 DIAGNOSIS — N289 Disorder of kidney and ureter, unspecified: Secondary | ICD-10-CM

## 2024-04-21 DIAGNOSIS — I10 Essential (primary) hypertension: Secondary | ICD-10-CM | POA: Diagnosis not present

## 2024-04-21 NOTE — Assessment & Plan Note (Signed)
  Improved symptoms with increased Cymbalta  dose. No significant side effects. Patient re-engaged socially. - Continue Cymbalta  90 mg daily.

## 2024-04-21 NOTE — Progress Notes (Signed)
 Subjective:     Patient ID: Jordan [name], female    DOB: 17-Jan-1951, 73 y.o.   MRN: [id number]  Chief Complaint  Patient presents with   Depression    Patient would like to talk about her medication    HPI  Discussed the use of AI scribe software for clinical note transcription with the patient, who gave verbal consent to proceed.  History of Present Illness Jordan [name] is a 73 year old female who presents for follow-up on her mood and medication management.  Her mood has improved since her last visit after increasing her Cymbalta  dose from 60 mg to 90 mg. She feels nearly back to her normal self and less irritable. She experiences occasional dizziness when getting out of bed or moving her head quickly.  She uses Lasix  almost daily at a dose of 10 mg, reduced from 20 mg. She takes it for swelling in her feet, which she attributes to prolonged bed rest over the past three months.  Pain is generally well-managed, but she experiences numbness and pain in her hands, particularly at night, which she associates with a previous shoulder injury. This affects her ability to open things, although she remains active in work and gardening.  She had a recent cold with a cough, managed with NyQuil pills. She notes a recent increase in blood pressure, attributing it to stress and activity prior to the appointment. She is on lisinopril  for blood pressure management.  She has experienced weight loss, losing 15 pounds since last summer, which she attributes to decreased appetite since starting her medication. She maintains her nutrition with a diet rich in protein, fruits, and vegetables.  BP Readings from Last 3 Encounters:  04/21/24 (!) 163/74  07/29/23 114/65  06/16/23 132/74        Health Maintenance Due  Topic Date Due   OPHTHALMOLOGY EXAM  07/26/2023   COVID-19 Vaccine (4 - 2024-25 season) 08/24/2023   HEMOGLOBIN A1C  10/01/2023   Diabetic kidney evaluation - Urine ACR   10/29/2023    Past Medical History:  Diagnosis Date   Acute hypoxemic respiratory failure due to COVID-19 (HCC) 10/08/2020   Cellulitis and abscess of finger, unspecified 401.9   Cellulitis and abscess of foot, except toes    Depressive disorder, not elsewhere classified    GERD (gastroesophageal reflux disease)    Gout, unspecified    History of tobacco abuse 12/01/2018   Hyperglycemia 05/08/2011   Hyperlipidemia    borderline- not on meds   Hypertension    Insomnia    Leg edema    chronic   Obesity, unspecified    Osteoarthritis    Renal insufficiency, mild 05/08/2011   SBO (small bowel obstruction) (HCC) 12/01/2018   Small bowel obstruction (HCC)     Past Surgical History:  Procedure Laterality Date   ABDOMINAL HYSTERECTOMY  2002   infection- no history of cancer   APPENDECTOMY  2002   catacact Bilateral 11/01/2019   CHOLECYSTECTOMY  1977   ECTOPIC PREGNANCY SURGERY  1971   1 tube removed   right shoulder rotator cuff repair  06/07/2010   TOTAL HIP ARTHROPLASTY Left 1998    Family History  Problem Relation Age of Onset   Diabetes Mother    Cancer Mother        colon/ pancreatic   Colon cancer Mother 15   Hypertension Father    Alcohol abuse Father    Cirrhosis Father    Other Sister  Pyeoderma gangrenosis   Diabetes Sister        type 2   Kidney failure Sister    Liver disease Sister    Cirrhosis Sister        had NASH, died heart failure   Heart attack Brother    Stomach cancer Maternal Grandfather    Obesity Daughter    Rectal cancer Neg Hx    Esophageal cancer Neg Hx     Social History   Socioeconomic History   Marital status: Divorced    Spouse name: Not on file   Number of children: 2   Years of education: Not on file   Highest education level: Not on file  Occupational History   Occupation: works prn at [employer]    Employer:   Tobacco Use   Smoking status: Former    Current packs/day: 0.00    Types: Cigarettes    Quit  date: 09/25/2020    Years since quitting: 3.5   Smokeless tobacco: Never  Vaping Use   Vaping status: Never Used  Substance and Sexual Activity   Alcohol use: Not Currently   Drug use: No   Sexual activity: Not Currently  Other Topics Concern   Not on file  Social History Narrative   2 children (daughter and son) both local. 3 grandchildren, one great grandchild   Works at ED front desk   Divorced (married x 30 years)   Social Drivers of Corporate investment banker Strain: Low Risk  (09/02/2023)   Overall Financial Resource Strain (CARDIA)    Difficulty of Paying Living Expenses: Not hard at all  Food Insecurity: No Food Insecurity (09/02/2023)   Hunger Vital Sign    Worried About Running Out of Food in the Last Year: Never true    Ran Out of Food in the Last Year: Never true  Transportation Needs: No Transportation Needs (09/02/2023)   PRAPARE - Administrator, Civil Service (Medical): No    Lack of Transportation (Non-Medical): No  Physical Activity: Inactive (09/02/2023)   Exercise Vital Sign    Days of Exercise per Week: 0 days    Minutes of Exercise per Session: 0 min  Stress: No Stress Concern Present (09/02/2023)   [employer] of Occupational Health - Occupational Stress Questionnaire    Feeling of Stress : Not at all  Social Connections: Moderately Isolated (09/02/2023)   Social Connection and Isolation Panel [NHANES]    Frequency of Communication with Friends and Family: More than three times a week    Frequency of Social Gatherings with Friends and Family: More than three times a week    Attends Religious Services: More than 4 times per year    Active Member of [employer] or Organizations: No    Attends Banker Meetings: Never    Marital Status: Divorced  Catering manager Violence: Not At Risk (09/02/2023)   Humiliation, Afraid, Rape, and Kick questionnaire    Fear of Current or Ex-Partner: No    Emotionally Abused: No    Physically Abused:  No    Sexually Abused: No    Outpatient Medications Prior to Visit  Medication Sig Dispense Refill   allopurinol  (ZYLOPRIM ) 100 MG tablet Take 2 tablets (200 mg total) by mouth daily. 180 tablet 1   aspirin  EC 81 MG tablet Take 81 mg by mouth daily.     Biotin (BIOTIN 5000) 5 MG CAPS Take 5 mg by mouth daily.     Cholecalciferol  (D3  2000) 50 MCG (2000 UT) CAPS Take 2,000 Units by mouth daily.     COVID-19 mRNA vaccine 2023-2024 (COMIRNATY ) syringe Inject into the muscle. 0.3 mL 0   Cyanocobalamin (B-12) 5000 MCG CAPS Take 5,000 mcg by mouth daily.     DULoxetine  (CYMBALTA ) 30 MG capsule Take 1 capsule (30 mg total) by mouth daily along with the 60 mg capsule for a total of 90 mg daily. 90 capsule 1   DULoxetine  (CYMBALTA ) 60 MG capsule Take 1 capsule (60 mg total) by mouth daily along with the 30 mg capsule for a total of 90 mg daily. 90 capsule 1   furosemide  (LASIX ) 20 MG tablet Take 1 tablet (20 mg total) by mouth 2 (two) times daily as needed for swelling. 60 tablet 0   influenza vaccine adjuvanted (FLUAD) 0.5 ML injection Inject into the muscle. 0.5 mL 0   lisinopril  (ZESTRIL ) 10 MG tablet Take 1 tablet (10 mg total) by mouth daily. 30 tablet 11   Multiple Vitamin (MULTIVITAMIN WITH MINERALS) TABS tablet Take 1 tablet by mouth daily.     pantoprazole  (PROTONIX ) 40 MG tablet Take 1 tablet (40 mg total) by mouth daily. 90 tablet 1   methocarbamol  (ROBAXIN ) 500 MG tablet Take 1 tablet (500 mg total) by mouth every 12 (twelve) hours. 30 tablet 10   No facility-administered medications prior to visit.    Allergies  Allergen Reactions   Mounjaro  [Tirzepatide ]     Severe nausea/vomitting, diarrhea    ROS    See HPI  Objective:    Physical Exam Constitutional:      General: She is not in acute distress.    Appearance: Normal appearance. She is well-developed.  HENT:     Head: Normocephalic and atraumatic.     Right Ear: External ear normal.     Left Ear: External ear normal.   Eyes:     General: No scleral icterus. Neck:     Thyroid : No thyromegaly.  Cardiovascular:     Rate and Rhythm: Normal rate and regular rhythm.     Heart sounds: Normal heart sounds. No murmur heard. Pulmonary:     Effort: Pulmonary effort is normal. No respiratory distress.     Breath sounds: Normal breath sounds. No wheezing.  Musculoskeletal:     Cervical back: Neck supple.  Skin:    General: Skin is warm and dry.  Neurological:     Mental Status: She is alert and oriented to person, place, and time.  Psychiatric:        Mood and Affect: Mood normal.        Behavior: Behavior normal.        Thought Content: Thought content normal.        Judgment: Judgment normal.      BP (!) 163/74 (BP Location: Right Arm, Patient Position: Sitting, Cuff Size: Large)   Pulse 79   Temp 97.7 F (36.5 C) (Oral)   Resp 16   Ht 5\' 4"  (1.626 m)   Wt 241 lb (109.3 kg)   SpO2 100%   BMI 41.37 kg/m  Wt Readings from Last 3 Encounters:  04/21/24 241 lb (109.3 kg)  07/29/23 256 lb (116.1 kg)  06/16/23 250 lb 9.6 oz (113.7 kg)       Assessment & Plan:   Problem List Items Addressed This Visit       Unprioritized   Renal insufficiency - Primary   Has follow up scheduled with nephrology.  Essential hypertension    Blood pressure elevated, possibly due to stress or activity. Managed with lisinopril . Home monitoring to assess need for medication adjustment. - Check blood pressure daily for a week, send readings. - Adjust lisinopril  if readings consistently high.       Depression    Improved symptoms with increased Cymbalta  dose. No significant side effects. Patient re-engaged socially. - Continue Cymbalta  90 mg daily.       I have discontinued Marietta Shorter. Wiggs's methocarbamol . I am also having her maintain her aspirin  EC, multivitamin with minerals, Comirnaty , B-12, D3 2000, Biotin, lisinopril , pantoprazole , Fluad, allopurinol , furosemide , DULoxetine , and DULoxetine .  No  orders of the defined types were placed in this encounter.

## 2024-04-21 NOTE — Assessment & Plan Note (Signed)
Has follow up scheduled with nephrology.

## 2024-04-21 NOTE — Patient Instructions (Signed)
 VISIT SUMMARY:  During today's visit, we discussed your mood, medication management, and several health concerns. Your mood has improved with the increased dose of Cymbalta , and you are feeling more like yourself. We also addressed your blood pressure, swelling in your feet, numbness and pain in your hands, and recent weight loss.  YOUR PLAN:  -HYPERTENSION: Hypertension means high blood pressure. Your blood pressure was elevated today, possibly due to stress or activity. You should check your blood pressure daily for a week and send the readings to us . We may adjust your lisinopril  if the readings are consistently high.  -EDEMA: Edema is swelling caused by excess fluid trapped in your body's tissues. You have swelling in your feet, which is being managed with Lasix . Continue taking Lasix  10 mg daily and discuss its use with your nephrologist.  -DEPRESSION: Depression is a mood disorder that causes persistent feelings of sadness and loss of interest. Your symptoms have improved with the increased dose of Cymbalta . Continue taking Cymbalta  90 mg daily.  -NUMBNESS AND PAIN IN HANDS: You are experiencing numbness and pain in your hands, which may be due to a previous shoulder injury or carpal tunnel syndrome. This affects your ability to open things, especially at night.  -WEIGHT LOSS: You have experienced unintentional weight loss, possibly due to decreased appetite. Ensure you maintain adequate nutrition with a diet rich in protein, fruits, and vegetables.  INSTRUCTIONS:  Please check your blood pressure daily for a week and send the readings to us . Continue taking your medications as prescribed. Discuss the use of Lasix  with your nephrologist. Ensure you are eating a balanced diet with enough protein, fruits, and vegetables.

## 2024-04-21 NOTE — Assessment & Plan Note (Signed)
  Blood pressure elevated, possibly due to stress or activity. Managed with lisinopril . Home monitoring to assess need for medication adjustment. - Check blood pressure daily for a week, send readings. - Adjust lisinopril  if readings consistently high.

## 2024-04-27 ENCOUNTER — Other Ambulatory Visit (HOSPITAL_BASED_OUTPATIENT_CLINIC_OR_DEPARTMENT_OTHER): Payer: Self-pay

## 2024-04-27 DIAGNOSIS — I129 Hypertensive chronic kidney disease with stage 1 through stage 4 chronic kidney disease, or unspecified chronic kidney disease: Secondary | ICD-10-CM | POA: Diagnosis not present

## 2024-04-27 DIAGNOSIS — E1122 Type 2 diabetes mellitus with diabetic chronic kidney disease: Secondary | ICD-10-CM | POA: Diagnosis not present

## 2024-04-27 DIAGNOSIS — N1330 Unspecified hydronephrosis: Secondary | ICD-10-CM | POA: Diagnosis not present

## 2024-04-27 DIAGNOSIS — N1831 Chronic kidney disease, stage 3a: Secondary | ICD-10-CM | POA: Diagnosis not present

## 2024-04-27 MED ORDER — LOSARTAN POTASSIUM 50 MG PO TABS
50.0000 mg | ORAL_TABLET | Freq: Every day | ORAL | 3 refills | Status: AC
  Filled 2024-04-27: qty 90, 90d supply, fill #0
  Filled 2024-07-27: qty 90, 90d supply, fill #1
  Filled 2024-10-25: qty 90, 90d supply, fill #2

## 2024-05-01 ENCOUNTER — Other Ambulatory Visit: Payer: Self-pay | Admitting: Family

## 2024-05-01 ENCOUNTER — Encounter: Payer: Self-pay | Admitting: Family

## 2024-05-01 DIAGNOSIS — R600 Localized edema: Secondary | ICD-10-CM

## 2024-05-03 ENCOUNTER — Other Ambulatory Visit (HOSPITAL_BASED_OUTPATIENT_CLINIC_OR_DEPARTMENT_OTHER): Payer: Self-pay

## 2024-05-03 MED ORDER — PANTOPRAZOLE SODIUM 40 MG PO TBEC
40.0000 mg | DELAYED_RELEASE_TABLET | Freq: Every day | ORAL | 1 refills | Status: DC
  Filled 2024-05-03: qty 90, 90d supply, fill #0
  Filled 2024-08-19: qty 90, 90d supply, fill #1

## 2024-05-03 MED ORDER — FUROSEMIDE 20 MG PO TABS
20.0000 mg | ORAL_TABLET | Freq: Two times a day (BID) | ORAL | 0 refills | Status: DC
  Filled 2024-05-03: qty 60, 30d supply, fill #0

## 2024-06-06 ENCOUNTER — Other Ambulatory Visit: Payer: Self-pay | Admitting: Family

## 2024-06-06 DIAGNOSIS — R600 Localized edema: Secondary | ICD-10-CM

## 2024-06-07 ENCOUNTER — Other Ambulatory Visit: Payer: Self-pay

## 2024-06-07 ENCOUNTER — Other Ambulatory Visit (HOSPITAL_BASED_OUTPATIENT_CLINIC_OR_DEPARTMENT_OTHER): Payer: Self-pay

## 2024-06-07 MED ORDER — FUROSEMIDE 20 MG PO TABS
20.0000 mg | ORAL_TABLET | Freq: Two times a day (BID) | ORAL | 0 refills | Status: DC
  Filled 2024-06-07: qty 60, 30d supply, fill #0

## 2024-06-15 ENCOUNTER — Encounter: Payer: Self-pay | Admitting: Pharmacist

## 2024-06-15 NOTE — Progress Notes (Signed)
 Pharmacy Quality Measure Review  This patient is appearing on a report for being at risk of failing the adherence measure for cholesterol (statin) medications this calendar year.   Medication: lisinopril  10mg  Last fill date: 03/28/2024 for 30 day supply  Patient is no longer taking lisinopril . Was changed to losartan  50mg  by Dr Jerrye on 04/28/2024 - filled 90 day supply  Madelin Ray, PharmD Clinical Pharmacist [facility]  Population Health [phone]

## 2024-06-17 ENCOUNTER — Other Ambulatory Visit: Payer: Self-pay

## 2024-06-17 ENCOUNTER — Other Ambulatory Visit (HOSPITAL_BASED_OUTPATIENT_CLINIC_OR_DEPARTMENT_OTHER): Payer: Self-pay

## 2024-06-17 ENCOUNTER — Other Ambulatory Visit: Payer: Self-pay | Admitting: Family

## 2024-06-17 MED ORDER — METHOCARBAMOL 500 MG PO TABS
500.0000 mg | ORAL_TABLET | Freq: Two times a day (BID) | ORAL | 10 refills | Status: AC
  Filled 2024-06-17: qty 30, 15d supply, fill #0
  Filled 2024-07-27: qty 30, 15d supply, fill #1
  Filled 2024-08-19: qty 30, 15d supply, fill #2
  Filled 2024-09-06: qty 30, 15d supply, fill #3
  Filled 2024-09-19 – 2024-10-04 (×3): qty 30, 15d supply, fill #4
  Filled 2024-10-25: qty 30, 15d supply, fill #5
  Filled 2024-11-11: qty 30, 15d supply, fill #6

## 2024-06-17 MED ORDER — ALLOPURINOL 100 MG PO TABS
200.0000 mg | ORAL_TABLET | Freq: Every day | ORAL | 0 refills | Status: DC
  Filled 2024-06-17: qty 180, 90d supply, fill #0

## 2024-07-05 ENCOUNTER — Telehealth: Payer: Self-pay | Admitting: Family

## 2024-07-05 NOTE — Telephone Encounter (Signed)
 Copied from CRM [id number]. Topic: Medicare AWV >> Jul 05, 2024 11:35 AM Nathanel DEL wrote: Reason for CRM: Called LVM 07/05/2024 to schedule AWV. Please schedule Virtual or Telehealth visits ONLY.   Nathanel Paschal; Care Guide Ambulatory Clinical Support [place] [facility] Direct Dial: [phone]

## 2024-07-27 ENCOUNTER — Other Ambulatory Visit: Payer: Self-pay

## 2024-07-27 ENCOUNTER — Other Ambulatory Visit: Payer: Self-pay | Admitting: Family

## 2024-07-27 ENCOUNTER — Other Ambulatory Visit (HOSPITAL_BASED_OUTPATIENT_CLINIC_OR_DEPARTMENT_OTHER): Payer: Self-pay

## 2024-07-27 DIAGNOSIS — R600 Localized edema: Secondary | ICD-10-CM

## 2024-07-27 MED ORDER — FUROSEMIDE 20 MG PO TABS
20.0000 mg | ORAL_TABLET | Freq: Two times a day (BID) | ORAL | 0 refills | Status: DC
  Filled 2024-07-27: qty 60, 30d supply, fill #0

## 2024-08-19 ENCOUNTER — Other Ambulatory Visit (HOSPITAL_BASED_OUTPATIENT_CLINIC_OR_DEPARTMENT_OTHER): Payer: Self-pay

## 2024-08-19 ENCOUNTER — Other Ambulatory Visit: Payer: Self-pay | Admitting: Family

## 2024-08-19 ENCOUNTER — Other Ambulatory Visit: Payer: Self-pay

## 2024-08-19 DIAGNOSIS — R600 Localized edema: Secondary | ICD-10-CM

## 2024-08-19 MED ORDER — FUROSEMIDE 20 MG PO TABS
20.0000 mg | ORAL_TABLET | Freq: Two times a day (BID) | ORAL | 0 refills | Status: DC
  Filled 2024-08-19: qty 60, 30d supply, fill #0

## 2024-08-31 ENCOUNTER — Other Ambulatory Visit (HOSPITAL_BASED_OUTPATIENT_CLINIC_OR_DEPARTMENT_OTHER): Payer: Self-pay

## 2024-08-31 ENCOUNTER — Other Ambulatory Visit: Payer: Self-pay | Admitting: Family

## 2024-08-31 DIAGNOSIS — F320 Major depressive disorder, single episode, mild: Secondary | ICD-10-CM

## 2024-08-31 MED ORDER — DULOXETINE HCL 30 MG PO CPEP
30.0000 mg | ORAL_CAPSULE | Freq: Every day | ORAL | 0 refills | Status: DC
Start: 2024-08-31 — End: 2024-10-02
  Filled 2024-08-31: qty 30, 30d supply, fill #0

## 2024-08-31 MED ORDER — DULOXETINE HCL 60 MG PO CPEP
60.0000 mg | ORAL_CAPSULE | Freq: Every day | ORAL | 0 refills | Status: DC
Start: 2024-08-31 — End: 2024-10-02
  Filled 2024-08-31: qty 30, 30d supply, fill #0

## 2024-09-01 ENCOUNTER — Telehealth: Payer: Self-pay | Admitting: Family

## 2024-09-01 NOTE — Telephone Encounter (Signed)
 Copied from CRM [id number]. Topic: Medicare AWV >> Sep 01, 2024  2:16 PM Nathanel DEL wrote: Reason for CRM: Called LVM 09/01/2024 to schedule AWV. Please schedule office or virtual visits.  Nathanel Paschal; Care Guide Ambulatory Clinical Support [place] [facility] Direct Dial: [phone]

## 2024-09-13 ENCOUNTER — Ambulatory Visit: Payer: Self-pay

## 2024-09-13 NOTE — Telephone Encounter (Signed)
 FYI Only or Action Required?: FYI only for provider.  Patient was last seen in primary care on 04/21/2024 by Daryl Setter, NP.  Called Nurse Triage reporting Knee Pain.  Symptoms began yesterday.  Interventions attempted: Nothing.  Symptoms are: unchanged.  Triage Disposition: See PCP When Office is Open (Within 3 Days)  Patient/caregiver understands and will follow disposition?: Yes, will follow disposition  Copied from CRM #[id number]. Topic: Clinical - Red Word Triage >> Sep 13, 2024  1:59 PM Rosina BIRCH wrote: Reason for RMF:ozqu left leg at the knee is hurting and has throbbing pain Reason for Disposition  [1] Swollen joint AND [2] no fever or redness  Answer Assessment - Initial Assessment Questions 1. LOCATION and RADIATION: Where is the pain located?      L knee,  2. QUALITY: What does the pain feel like?  (e.g., sharp, dull, aching, burning)     throbbing 3. SEVERITY: How bad is the pain? What does it keep you from doing?   (Scale 1-10; or mild, moderate, severe)     Sitting pt states the pain is 5, walking pt states the pain is up to 9 4. ONSET: When did the pain start? Does it come and go, or is it there all the time?     yesterday 5. RECURRENT: Have you had this pain before? If Yes, ask: When, and what happened then?     Has this pain in her fingers when she is having a gout flare 6. SETTING: Has there been any recent work, exercise or other activity that involved that part of the body?      denies 7. AGGRAVATING FACTORS: What makes the knee pain worse? (e.g., walking, climbing stairs, running)     walking 8. ASSOCIATED SYMPTOMS: Is there any swelling or redness of the knee?     Little swollen 9. OTHER SYMPTOMS: Do you have any other symptoms? (e.g., calf pain, chest pain, difficulty breathing, fever)     Denies  Pt states that she only wants to see PCP. Scheduled 9/26.  Protocols used: Knee Pain-A-AH

## 2024-09-13 NOTE — Telephone Encounter (Signed)
 Appt scheduled

## 2024-09-17 ENCOUNTER — Other Ambulatory Visit (HOSPITAL_BASED_OUTPATIENT_CLINIC_OR_DEPARTMENT_OTHER): Payer: Self-pay

## 2024-09-17 ENCOUNTER — Ambulatory Visit (INDEPENDENT_AMBULATORY_CARE_PROVIDER_SITE_OTHER): Admitting: Family

## 2024-09-17 VITALS — BP 143/78 | HR 80 | Temp 98.7°F | Resp 16 | Ht 65.0 in | Wt 243.0 lb

## 2024-09-17 DIAGNOSIS — M109 Gout, unspecified: Secondary | ICD-10-CM | POA: Diagnosis not present

## 2024-09-17 DIAGNOSIS — I10 Essential (primary) hypertension: Secondary | ICD-10-CM | POA: Diagnosis not present

## 2024-09-17 DIAGNOSIS — M25562 Pain in left knee: Secondary | ICD-10-CM | POA: Diagnosis not present

## 2024-09-17 DIAGNOSIS — Z23 Encounter for immunization: Secondary | ICD-10-CM

## 2024-09-17 DIAGNOSIS — E119 Type 2 diabetes mellitus without complications: Secondary | ICD-10-CM | POA: Diagnosis not present

## 2024-09-17 MED ORDER — TRAMADOL HCL 50 MG PO TABS
50.0000 mg | ORAL_TABLET | Freq: Three times a day (TID) | ORAL | 0 refills | Status: AC | PRN
  Filled 2024-09-17: qty 15, 5d supply, fill #0

## 2024-09-17 MED ORDER — COLCHICINE 0.6 MG PO TABS
ORAL_TABLET | ORAL | 1 refills | Status: DC
  Filled 2024-09-17: qty 6, 1d supply, fill #0
  Filled 2024-09-19 – 2024-10-04 (×3): qty 6, 1d supply, fill #1

## 2024-09-17 NOTE — Assessment & Plan Note (Signed)
 BP Readings from Last 3 Encounters:  09/17/24 (!) 143/78  04/21/24 (!) 163/74  07/29/23 114/65   BP fair today. Continue losartan .

## 2024-09-17 NOTE — Patient Instructions (Signed)
 VISIT SUMMARY:  You visited us  today due to severe pain in your left knee, which has made walking difficult. We also discussed your chronic hand pain, high blood pressure, and diabetes management.  YOUR PLAN:  LEFT KNEE PAIN: You have severe pain in your left knee, possibly due to gout or nerve involvement. The pain is less when you extend your knee and lie down. -We will check your uric acid level. -Take colchicine : 2 tablets initially, then 1 tablet after an hour. -We have prescribed short-term tramadol  for pain relief. -Use Voltaren  gel on your knee. -We are referring you to Sports Medicine for further evaluation.  HAND PAIN: You have chronic pain in your hand, which may be related to a previous gout flare-up. -Continue to monitor your hand pain and let us  know if it worsens.  HYPERTENSION: Your blood pressure is elevated, and you are currently on medication for this. -We will recheck your blood pressure during the visit.  TYPE 2 DIABETES MELLITUS: You have type 2 diabetes and have been losing weight. You had a previous adverse reaction to medication. -We will order an A1c test to check your blood sugar levels. -We will also order a urine test.

## 2024-09-17 NOTE — Assessment & Plan Note (Signed)
  Acute severe left knee pain with possible gout flare versus OA. Pain relief with knee extension and lying down.  - Order uric acid level. - Prescribe colchicine : 2 tablets initially, then 1 tablet after an hour. - Prescribe short-term tramadol . - Recommend Voltaren  gel. (Avoid oral NSAIDS due to renal insufficiency) - Refer to Sports Medicine.

## 2024-09-17 NOTE — Progress Notes (Signed)
 Subjective:     Patient ID: Jordan [name], female    DOB: June 18, 1951, 73 y.o.   MRN: [id number]  Chief Complaint  Patient presents with   Knee Pain    Complains of left knee pain    Knee Pain     Discussed the use of AI scribe software for clinical note transcription with the patient, who gave verbal consent to proceed.  History of Present Illness  Jordan [name] is a 73 year old female who presents with left knee pain.  She experiences severe pain primarily on the side of her left knee, described as 'hurts so bad'. The pain radiates through the knee area and began suddenly on a Sunday morning, making ambulation difficult. There is no recent trauma such as falls or twists to the knee. Resting on Monday and Tuesday resulted in some improvement, but the pain persists, especially when using the knee to lift. The pain is less severe when lying in bed with the leg stretched out. She also experienced a transient episode of numbness in her foot, which resolved after one day.  She recalls a previous flare-up of gout pain in her right hand in August, which still causes discomfort when touched and limits her ability to close her hand fully. The pain in her hand has improved over time.  She has been using Robaxin , which provided minimal relief. She has a history of back pain and a past hip replacement.  She is concerned about her ability to attend a wedding in [place]  next Saturday due to her knee pain. She has been using medication prescribed for her blood pressure.     Health Maintenance Due  Topic Date Due   Diabetic kidney evaluation - Urine ACR  Never done   OPHTHALMOLOGY EXAM  07/26/2023   HEMOGLOBIN A1C  10/01/2023   Diabetic kidney evaluation - eGFR measurement  06/15/2024   Influenza Vaccine  07/23/2024   COVID-19 Vaccine (4 - 2025-26 season) 08/23/2024   Medicare Annual Wellness (AWV)  09/01/2024    Past Medical History:  Diagnosis Date   Acute hypoxemic respiratory  failure due to COVID-19 (HCC) 10/08/2020   Cellulitis and abscess of finger, unspecified 401.9   Cellulitis and abscess of foot, except toes    Depressive disorder, not elsewhere classified    GERD (gastroesophageal reflux disease)    Gout, unspecified    History of tobacco abuse 12/01/2018   Hyperglycemia 05/08/2011   Hyperlipidemia    borderline- not on meds   Hypertension    Insomnia    Leg edema    chronic   Obesity, unspecified    Osteoarthritis    Renal insufficiency, mild 05/08/2011   SBO (small bowel obstruction) (HCC) 12/01/2018   Small bowel obstruction (HCC)     Past Surgical History:  Procedure Laterality Date   ABDOMINAL HYSTERECTOMY  2002   infection- no history of cancer   APPENDECTOMY  2002   catacact Bilateral 11/01/2019   CHOLECYSTECTOMY  1977   ECTOPIC PREGNANCY SURGERY  1971   1 tube removed   right shoulder rotator cuff repair  06/07/2010   TOTAL HIP ARTHROPLASTY Left 1998    Family History  Problem Relation Age of Onset   Diabetes Mother    Cancer Mother        colon/ pancreatic   Colon cancer Mother 93   Hypertension Father    Alcohol abuse Father    Cirrhosis Father    Other Sister  Pyeoderma gangrenosis   Diabetes Sister        type 2   Kidney failure Sister    Liver disease Sister    Cirrhosis Sister        had NASH, died heart failure   Heart attack Brother    Stomach cancer Maternal Grandfather    Obesity Daughter    Rectal cancer Neg Hx    Esophageal cancer Neg Hx     Social History   Socioeconomic History   Marital status: Divorced    Spouse name: Not on file   Number of children: 2   Years of education: Not on file   Highest education level: Not on file  Occupational History   Occupation: works prn at [employer]    Employer: [employer]  Tobacco Use   Smoking status: Former    Current packs/day: 0.00    Types: Cigarettes    Quit date: 09/25/2020    Years since quitting: 3.9   Smokeless tobacco: Never  Vaping Use    Vaping status: Never Used  Substance and Sexual Activity   Alcohol use: Not Currently   Drug use: No   Sexual activity: Not Currently  Other Topics Concern   Not on file  Social History Narrative   2 children (daughter and son) both local. 3 grandchildren, one great grandchild   Works at ED front desk   Divorced (married x 30 years)   Social Drivers of Corporate investment banker Strain: Low Risk  (09/02/2023)   Overall Financial Resource Strain (CARDIA)    Difficulty of Paying Living Expenses: Not hard at all  Food Insecurity: No Food Insecurity (09/02/2023)   Hunger Vital Sign    Worried About Running Out of Food in the Last Year: Never true    Ran Out of Food in the Last Year: Never true  Transportation Needs: No Transportation Needs (09/02/2023)   PRAPARE - Administrator, Civil Service (Medical): No    Lack of Transportation (Non-Medical): No  Physical Activity: Inactive (09/02/2023)   Exercise Vital Sign    Days of Exercise per Week: 0 days    Minutes of Exercise per Session: 0 min  Stress: No Stress Concern Present (09/02/2023)   [employer] of Occupational Health - Occupational Stress Questionnaire    Feeling of Stress : Not at all  Social Connections: Moderately Isolated (09/02/2023)   Social Connection and Isolation Panel    Frequency of Communication with Friends and Family: More than three times a week    Frequency of Social Gatherings with Friends and Family: More than three times a week    Attends Religious Services: More than 4 times per year    Active Member of [employer] or Organizations: No    Attends Banker Meetings: Never    Marital Status: Divorced  Catering manager Violence: Not At Risk (09/02/2023)   Humiliation, Afraid, Rape, and Kick questionnaire    Fear of Current or Ex-Partner: No    Emotionally Abused: No    Physically Abused: No    Sexually Abused: No    Outpatient Medications Prior to Visit  Medication Sig Dispense  Refill   allopurinol  (ZYLOPRIM ) 100 MG tablet Take 2 tablets (200 mg total) by mouth daily. 180 tablet 0   aspirin  EC 81 MG tablet Take 81 mg by mouth daily.     Biotin (BIOTIN 5000) 5 MG CAPS Take 5 mg by mouth daily.     Cholecalciferol  (D3 2000)  50 MCG (2000 UT) CAPS Take 2,000 Units by mouth daily.     COVID-19 mRNA vaccine 2023-2024 (COMIRNATY ) syringe Inject into the muscle. 0.3 mL 0   Cyanocobalamin (B-12) 5000 MCG CAPS Take 5,000 mcg by mouth daily.     DULoxetine  (CYMBALTA ) 30 MG capsule Take 1 capsule (30 mg total) by mouth daily along with the 60 mg capsule for a total of 90 mg daily. 30 capsule 0   DULoxetine  (CYMBALTA ) 60 MG capsule Take 1 capsule (60 mg total) by mouth daily along with the 30 mg capsule for a total of 90 mg daily. 30 capsule 0   furosemide  (LASIX ) 20 MG tablet Take 1 tablet (20 mg total) by mouth 2 (two) times daily as needed for swelling. *Need appointment for future refills.* 60 tablet 0   influenza vaccine adjuvanted (FLUAD ) 0.5 ML injection Inject into the muscle. 0.5 mL 0   losartan  (COZAAR ) 50 MG tablet Take 1 tablet (50 mg total) by mouth daily. 90 tablet 3   methocarbamol  (ROBAXIN ) 500 MG tablet Take 1 tablet (500 mg total) by mouth 2 (two) times daily. 30 tablet 10   Multiple Vitamin (MULTIVITAMIN WITH MINERALS) TABS tablet Take 1 tablet by mouth daily.     pantoprazole  (PROTONIX ) 40 MG tablet Take 1 tablet (40 mg total) by mouth daily. 90 tablet 1   No facility-administered medications prior to visit.    Allergies  Allergen Reactions   Mounjaro  [Tirzepatide ]     Severe nausea/vomitting, diarrhea    ROS See HPI    Objective:    Physical Exam Constitutional:      Appearance: Normal appearance.  Cardiovascular:     Rate and Rhythm: Normal rate.  Pulmonary:     Effort: Pulmonary effort is normal.  Musculoskeletal:        General: No swelling.     Comments: Left lateral knee tenderness to palpation, no swelling, redness or warmth noted   Neurological:     Mental Status: She is alert.      BP (!) 143/78   Pulse 80   Temp 98.7 F (37.1 C) (Oral)   Resp 16   Ht 5' 5 (1.651 m)   Wt 243 lb (110.2 kg)   SpO2 98%   BMI 40.44 kg/m  Wt Readings from Last 3 Encounters:  09/17/24 243 lb (110.2 kg)  04/21/24 241 lb (109.3 kg)  07/29/23 256 lb (116.1 kg)       Assessment & Plan:   Problem List Items Addressed This Visit       Unprioritized   Left lateral knee pain    Acute severe left knee pain with possible gout flare versus OA. Pain relief with knee extension and lying down.  - Order uric acid level. - Prescribe colchicine : 2 tablets initially, then 1 tablet after an hour. - Prescribe short-term tramadol . - Recommend Voltaren  gel. (Avoid oral NSAIDS due to renal insufficiency) - Refer to Sports Medicine.      Relevant Medications   colchicine  0.6 MG tablet   traMADol  (ULTRAM ) 50 MG tablet   Other Relevant Orders   Ambulatory referral to Sports Medicine   Uric acid   Gout   Check uric acid level. Can trial colchicine  over the weekend to see if it helps knee pain, but I suspect OA is more likely.      Essential hypertension   BP Readings from Last 3 Encounters:  09/17/24 (!) 143/78  04/21/24 (!) 163/74  07/29/23 114/65   BP  fair today. Continue losartan .       Diabetes type 2, controlled (HCC)   Update A1C today. Pt is working hard on diet/weight loss.      Relevant Orders   Urine Microalbumin w/creat. ratio   HgB A1c   Basic Metabolic Panel (BMET)   Other Visit Diagnoses       Needs flu shot    -  Primary   Relevant Orders   Flu vaccine HIGH DOSE PF(Fluzone Trivalent)      Flu shot today.   I am having Jordan [name] start on colchicine  and traMADol . I am also having her maintain her aspirin  EC, multivitamin with minerals, Comirnaty , B-12, D3 2000, Biotin, Fluad , losartan , pantoprazole , allopurinol , methocarbamol , furosemide , DULoxetine , and DULoxetine .  Meds ordered this  encounter  Medications   colchicine  0.6 MG tablet    Sig: Take 2 tabs (1.2 mg) by mouth once, then 1 tab (0.6 mg)  every 2 hours as needed until pain is gone. Max of 6 tabs in 24 hours or intolerable diarrhea.    Dispense:  6 tablet    Refill:  1    Supervising Provider:   DOMENICA BLACKBIRD A [[id number]]   traMADol  (ULTRAM ) 50 MG tablet    Sig: Take 1 tablet (50 mg total) by mouth every 8 (eight) hours as needed for up to 5 days.    Dispense:  15 tablet    Refill:  0    Supervising Provider:   DOMENICA BLACKBIRD A [[id number]]

## 2024-09-17 NOTE — Assessment & Plan Note (Signed)
 Update A1C today. Pt is working hard on diet/weight loss.

## 2024-09-17 NOTE — Assessment & Plan Note (Signed)
 Check uric acid level. Can trial colchicine  over the weekend to see if it helps knee pain, but I suspect OA is more likely.

## 2024-09-18 LAB — BASIC METABOLIC PANEL WITH GFR
BUN: 14 mg/dL (ref 7–25)
CO2: 28 mmol/L (ref 20–32)
Calcium: 9.5 mg/dL (ref 8.6–10.4)
Chloride: 106 mmol/L (ref 98–110)
Creat: 0.93 mg/dL (ref 0.60–1.00)
Glucose, Bld: 97 mg/dL (ref 65–99)
Potassium: 3.9 mmol/L (ref 3.5–5.3)
Sodium: 141 mmol/L (ref 135–146)
eGFR: 65 mL/min/1.73m2 (ref >=60)

## 2024-09-18 LAB — HEMOGLOBIN A1C
Hgb A1c MFr Bld: 5.8 % — ABNORMAL HIGH (ref <5.7)
Mean Plasma Glucose: 120 mg/dL
eAG (mmol/L): 6.6 mmol/L

## 2024-09-18 LAB — MICROALBUMIN / CREATININE URINE RATIO
Creatinine, Urine: 120 mg/dL (ref 20–275)
Microalb Creat Ratio: 228 mg/g{creat} — ABNORMAL HIGH (ref <30)
Microalb, Ur: 27.4 mg/dL

## 2024-09-18 LAB — URIC ACID: Uric Acid, Serum: 6.1 mg/dL (ref 2.5–7.0)

## 2024-09-19 ENCOUNTER — Encounter: Payer: Self-pay | Admitting: Family

## 2024-09-19 ENCOUNTER — Other Ambulatory Visit: Payer: Self-pay | Admitting: Family

## 2024-09-19 DIAGNOSIS — R600 Localized edema: Secondary | ICD-10-CM

## 2024-09-20 ENCOUNTER — Other Ambulatory Visit (HOSPITAL_BASED_OUTPATIENT_CLINIC_OR_DEPARTMENT_OTHER): Payer: Self-pay

## 2024-09-20 ENCOUNTER — Other Ambulatory Visit: Payer: Self-pay

## 2024-09-20 ENCOUNTER — Ambulatory Visit: Payer: Self-pay | Admitting: Family

## 2024-09-20 MED ORDER — ALLOPURINOL 100 MG PO TABS
200.0000 mg | ORAL_TABLET | Freq: Every day | ORAL | 0 refills | Status: DC
  Filled 2024-09-20 – 2024-10-04 (×3): qty 180, 90d supply, fill #0

## 2024-09-20 MED ORDER — FUROSEMIDE 20 MG PO TABS
20.0000 mg | ORAL_TABLET | Freq: Two times a day (BID) | ORAL | 0 refills | Status: DC
Start: 2024-09-20 — End: 2024-11-11
  Filled 2024-09-20 – 2024-10-04 (×3): qty 60, 30d supply, fill #0

## 2024-09-21 ENCOUNTER — Ambulatory Visit

## 2024-09-21 ENCOUNTER — Ambulatory Visit: Admitting: Family

## 2024-09-21 ENCOUNTER — Ambulatory Visit (HOSPITAL_BASED_OUTPATIENT_CLINIC_OR_DEPARTMENT_OTHER)
Admission: RE | Admit: 2024-09-21 | Discharge: 2024-09-21 | Disposition: A | Discharge status: Home / Self Care | Source: Ambulatory Visit

## 2024-09-21 VITALS — BP 140/80 | Ht 65.0 in | Wt 243.0 lb

## 2024-09-21 DIAGNOSIS — M25562 Pain in left knee: Secondary | ICD-10-CM | POA: Insufficient documentation

## 2024-09-21 DIAGNOSIS — M25569 Pain in unspecified knee: Secondary | ICD-10-CM | POA: Insufficient documentation

## 2024-09-21 DIAGNOSIS — M25561 Pain in right knee: Secondary | ICD-10-CM | POA: Diagnosis not present

## 2024-09-21 DIAGNOSIS — M85862 Other specified disorders of bone density and structure, left lower leg: Secondary | ICD-10-CM | POA: Diagnosis not present

## 2024-09-21 DIAGNOSIS — M25462 Effusion, left knee: Secondary | ICD-10-CM | POA: Diagnosis not present

## 2024-09-21 DIAGNOSIS — M1711 Unilateral primary osteoarthritis, right knee: Secondary | ICD-10-CM | POA: Diagnosis not present

## 2024-09-21 DIAGNOSIS — M1712 Unilateral primary osteoarthritis, left knee: Secondary | ICD-10-CM | POA: Diagnosis not present

## 2024-09-21 NOTE — Progress Notes (Signed)
   Subjective:    Patient ID: Jordan [name], female    DOB: 73 y.o., [birth year]   MRN: [id number]  HPI  Chief Complaint: Left knee pain  Patient evaluated by PCP (Dr. Daryl) on 09/17/2024: Relatively rapid onset of pain approximately 9 days ago Does have history of gout on allopurinol  Concern at that time greatest for OA flare versus gout.  Uric acid level ordered and returned as 6.1 (Previously 7.5 five years ago and as high as 8.3 fifteen years ago). She was recommended to use short-term tramadol , Voltaren  gel, and colchicine .  Since then the patient reports that gradually the pain is subsiding. No fevers, chills, night sweats. Knee is still painful but it is not red, hot, swollen. There was some swelling particularly initially but this has subsided to some degree Reports that she took the colchicine  for 3 doses which helped some. Believes that naproxen is quite helpful for this but knows that this can affect her kidneys and she has CKD 3. Did have a significant uptake in the amount that she was walking the day prior to the onset of the pain No significant changes in diet such as increased seafood intake, alcohol, red meat, etc.  Review of pertinent imaging: No x-ray imaging available of the left knee on file     Objective:   Physical Exam Vitals:   09/21/24 1439  BP: (!) 140/80   Left knee (compared to normal) - General: Antalgic gait. -Inspection: swelling present.  No erythema, deformity.  Effusion difficult to appreciate visually 2/2 body habitus. -Palpation: TTP - quad tendon, - patella, - patellar tendon, - tibial tuberosity, - pes bursa, - gerdy tubercle, + medial joint line, + lateral joint line, - posterior knee, - medial and lateral hamstrings.  Moderate crepitus with flexion/extension. -AROM/PROM: 0 degrees extension, 115 degrees flexion, low hamstring flexibility -Strength: Unable to tolerate single leg squat, 5/5 flexion, 5/5 extension -Special tests:     -ACL: - lachman, - lever test   -MCL: 1+ and painless with valgus at 0/30 degrees   -LCL: stable and painless with varus at 0/30 degrees   -PCL:  sag sign   -Meniscus: + thessaly, + McMurray   -Patellofemoral: Equivocal patellar grind   Assessment & Plan:   Jordan [name] is a very pleasant 73 y.o. female with past medical history significant for CKD 3, gout, hypothyroidism, presenting with a fairly acute onset pain in her left knee after an increase in activity the day prior.  I suspect that she likely has either a flare of arthritis but cannot definitively say this given that we do not have plain film imaging of either of her knees.  I will update x-rays of both of her knees today for future reference and recommend she initiate Voltaren  gel and lieu of naproxen to minimize renal side effects.  Once x-rays return, I would be happy to provide her with a corticosteroid injection later this week prior to her leaving for a trip to [place]  Thursday evening.  I have a very low index of suspicion for joint infection based on the lack of red flag signs/symptoms and her improvement with no antibiotic therapy.  Follow-up x-ray results.

## 2024-09-22 ENCOUNTER — Other Ambulatory Visit: Payer: Self-pay

## 2024-09-22 ENCOUNTER — Ambulatory Visit: Payer: Self-pay

## 2024-09-22 ENCOUNTER — Ambulatory Visit

## 2024-09-22 VITALS — Ht 65.0 in | Wt 243.0 lb

## 2024-09-22 DIAGNOSIS — M25562 Pain in left knee: Secondary | ICD-10-CM

## 2024-09-22 MED ORDER — METHYLPREDNISOLONE ACETATE 40 MG/ML IJ SUSP
40.0000 mg | Freq: Once | INTRAMUSCULAR | Status: AC
  Administered 2024-09-22: 40 mg via INTRA_ARTICULAR

## 2024-09-22 NOTE — Progress Notes (Signed)
   Subjective:    Patient ID: Jordan [name], female    DOB: 73 y.o., May 31, 1951   MRN: [id number]  Intra-articular Knee Injection with Ultrasound Guidance Procedure Note Jordan [name] 01/29/1951 Indications: Pain Procedure Details Following the description of risks including infection, bleeding, damage to surrounding structures, patient provided written consent for left knee joint injection procedure. US  was used to identify the suprapatellar pouch. Patient was sterilely prepped in the usual fashion with alcohol.  Following topical anesthetization with ethyl chloride they were injected with a solution of 40mg  Depo-medrol  and 3cc Mepivacaine 2% via the superolateral approach into the suprapatellar pouch. This was well visualized under ultrasound, please see associated photographic documentation. Patient tolerated well without complication.  Precautions provided. Cleaned and dressing applied.

## 2024-09-30 ENCOUNTER — Other Ambulatory Visit (HOSPITAL_BASED_OUTPATIENT_CLINIC_OR_DEPARTMENT_OTHER): Payer: Self-pay

## 2024-10-02 ENCOUNTER — Other Ambulatory Visit: Payer: Self-pay | Admitting: Family

## 2024-10-02 DIAGNOSIS — F320 Major depressive disorder, single episode, mild: Secondary | ICD-10-CM

## 2024-10-03 ENCOUNTER — Other Ambulatory Visit (HOSPITAL_BASED_OUTPATIENT_CLINIC_OR_DEPARTMENT_OTHER): Payer: Self-pay

## 2024-10-04 ENCOUNTER — Encounter: Payer: Self-pay | Admitting: *Deleted

## 2024-10-04 ENCOUNTER — Other Ambulatory Visit: Payer: Self-pay

## 2024-10-04 ENCOUNTER — Other Ambulatory Visit (HOSPITAL_BASED_OUTPATIENT_CLINIC_OR_DEPARTMENT_OTHER): Payer: Self-pay

## 2024-10-04 ENCOUNTER — Ambulatory Visit

## 2024-10-04 VITALS — BP 142/92 | Ht 65.0 in | Wt 243.0 lb

## 2024-10-04 DIAGNOSIS — M25462 Effusion, left knee: Secondary | ICD-10-CM

## 2024-10-04 DIAGNOSIS — M1712 Unilateral primary osteoarthritis, left knee: Secondary | ICD-10-CM

## 2024-10-04 MED ORDER — DULOXETINE HCL 30 MG PO CPEP
30.0000 mg | ORAL_CAPSULE | Freq: Every day | ORAL | 0 refills | Status: DC
  Filled 2024-10-04: qty 30, 30d supply, fill #0

## 2024-10-04 MED ORDER — DULOXETINE HCL 60 MG PO CPEP
60.0000 mg | ORAL_CAPSULE | Freq: Every day | ORAL | 0 refills | Status: DC
  Filled 2024-10-04: qty 30, 30d supply, fill #0

## 2024-10-04 NOTE — Progress Notes (Signed)
   Subjective:    Patient ID: Jordan [name], female    DOB: 73 y.o., November 24, 1951   MRN: [id number]  Chief Complaint: L knee osteoarthritis  Discussed the use of AI scribe software for clinical note transcription with the patient, who gave verbal consent to proceed.  History of Present Illness Jordan [name] is a 73 year old female who presents with persistent left knee pain and leg numbness.  Left knee pain and lower extremity neuropathy - Persistent left knee pain radiating down the leg - Associated numbness and burning sensation, particularly severe at night - Swelling of the left knee - Significant discomfort with knee extension - Pain onset was sudden following an event; concerned about possible knee tear - Pain worsens after prolonged standing, such as during night shifts - Resumed use of a cane due to knee pain after previously walking unaided for six months post-lumbar surgery  Prior lumbar spine surgery - History of L4 and L5 spinal surgery with screw placement last year - Current leg pain is different in character from pre-surgical pain  Pain management and medication use - Current medications include Aleve and Voltaren  cream, alternated with an essential oil for pain relief - Recent steroid injection provided no relief - Previously used Tramadol  but has exhausted her supply  Objective:   Vitals:   10/04/24 0902  BP: (!) 160/90    Left knee: Lancinating pain experienced that radiates partially downward into the lower leg when palpating the medial joint line.  Low back: Negative straight leg raise No pain reproduced with slump test or arching of back.     Assessment & Plan:   Assessment & Plan Left knee osteoarthritis with effusion   Chronic left knee osteoarthritis with effusion is likely under-reported by x-ray. A recent steroid injection provided limited relief, possibly due to residual fluid diluting the steroid or severe arthritis. She experiences pain  radiating down the leg, numbness, and a burning sensation, especially at night. While nerve impingement from the back or knee is considered, symptoms are atypical for back-related issues. A meniscal tear is possible due to arthritis-related joint space narrowing, but an MRI is not pursued due to cost and expected findings of arthritis and meniscal tears. Initiate insurance approval for a gel injection (Duralane) in the left knee. Continue using Voltaren  gel, essential oils, and Aleve for pain management. Advise obtaining a knee sleeve from a pharmacy for additional support. Recheck blood pressure before leaving the office. Contact her once insurance approval for the gel injection is obtained.

## 2024-10-06 ENCOUNTER — Other Ambulatory Visit: Payer: Self-pay

## 2024-10-22 NOTE — Progress Notes (Signed)
 Jordan [name]                                          MRN: [id number]   10/22/2024   The [facility] Quality Team Specialist reviewed this patient medical record for the purposes of chart review for care gap closure. The following were reviewed: chart review for care gap closure-controlling blood pressure.    [facility] Quality Team

## 2024-10-25 ENCOUNTER — Ambulatory Visit

## 2024-10-25 ENCOUNTER — Other Ambulatory Visit (HOSPITAL_BASED_OUTPATIENT_CLINIC_OR_DEPARTMENT_OTHER): Payer: Self-pay

## 2024-10-25 ENCOUNTER — Other Ambulatory Visit: Payer: Self-pay

## 2024-10-25 VITALS — BP 138/88 | Ht 65.0 in | Wt 243.0 lb

## 2024-10-25 DIAGNOSIS — M1712 Unilateral primary osteoarthritis, left knee: Secondary | ICD-10-CM

## 2024-10-25 MED ORDER — SODIUM HYALURONATE 60 MG/3ML IX PRSY
60.0000 mg | PREFILLED_SYRINGE | Freq: Once | INTRA_ARTICULAR | Status: AC
  Administered 2024-10-25: 60 mg via INTRA_ARTICULAR

## 2024-10-25 NOTE — Progress Notes (Signed)
   Subjective:    Patient ID: Jordan [name], female    DOB: 73 y.o., Jun 19, 1951   MRN: [id number]  Chief Complaint: Left knee osteoarthritis (Durolane injection)   History of Present Illness   Patient reporting swelling and pain of both improved and left knee since last visit.  Here to receive left knee Durolane injection.     Objective:   Vitals:   10/25/24 0948  BP: 138/88    Intra-articular Knee Hyaluronic Acid Injection/Aspiration with Ultrasound Guidance Procedure Note Jordan [name] [birth year] Indications: Pain Procedure Details Following the description of risks including infection bleeding, damage to surrounding structures, patient provided verbal/written consent for Left knee injection procedure. Palpated for the superior-lateral joint space and under US  guidance. Patient was sterilely prepped in the usual fashion with chlorhexidine .  Following tract anesthesia with 3cc of Lidocaine  1% buffered with 0.5cc Sodium Bicarbonate 8.4% with a 25 gauge needle, an 18 gauge needle was inserted which aspirated 12 cc's of serous appearing fluid prior to injection of visco-supplementation. Patient tolerated well without complication.  Precautions provided.       Assessment & Plan:   Assessment & Plan  Jordan [name] is an exceptionally pleasant 73 year old lady with left knee osteoarthritis presenting for a Durolane injection in her left knee after having some refractory pain following an intra-articular corticosteroid injection.  She has reported continued interval improvement since the steroid injection and tolerated the Durolane injection today well.  Recommend follow-up as needed at this point.  Can consider prolotherapy in the future if pain worsens/persists.

## 2024-11-11 ENCOUNTER — Other Ambulatory Visit (HOSPITAL_BASED_OUTPATIENT_CLINIC_OR_DEPARTMENT_OTHER): Payer: Self-pay

## 2024-11-11 ENCOUNTER — Other Ambulatory Visit: Payer: Self-pay | Admitting: Family

## 2024-11-11 ENCOUNTER — Other Ambulatory Visit: Payer: Self-pay

## 2024-11-11 DIAGNOSIS — M25562 Pain in left knee: Secondary | ICD-10-CM

## 2024-11-11 DIAGNOSIS — F320 Major depressive disorder, single episode, mild: Secondary | ICD-10-CM

## 2024-11-11 DIAGNOSIS — R600 Localized edema: Secondary | ICD-10-CM

## 2024-11-11 MED ORDER — FUROSEMIDE 20 MG PO TABS
20.0000 mg | ORAL_TABLET | Freq: Two times a day (BID) | ORAL | 1 refills | Status: AC
  Filled 2024-11-11: qty 180, 90d supply, fill #0

## 2024-11-11 MED ORDER — COLCHICINE 0.6 MG PO TABS
ORAL_TABLET | ORAL | 1 refills | Status: AC
  Filled 2024-11-11: qty 6, 2d supply, fill #0

## 2024-11-11 MED ORDER — PANTOPRAZOLE SODIUM 40 MG PO TBEC
40.0000 mg | DELAYED_RELEASE_TABLET | Freq: Every day | ORAL | 1 refills | Status: AC
  Filled 2024-11-11: qty 90, 90d supply, fill #0

## 2024-11-11 MED ORDER — DULOXETINE HCL 60 MG PO CPEP
60.0000 mg | ORAL_CAPSULE | Freq: Every day | ORAL | 1 refills | Status: AC
  Filled 2024-11-11: qty 90, 90d supply, fill #0

## 2024-11-11 MED ORDER — DULOXETINE HCL 30 MG PO CPEP
30.0000 mg | ORAL_CAPSULE | Freq: Every day | ORAL | 1 refills | Status: AC
Start: 2024-11-11 — End: ?
  Filled 2024-11-11: qty 90, 90d supply, fill #0

## 2025-01-04 ENCOUNTER — Other Ambulatory Visit (HOSPITAL_BASED_OUTPATIENT_CLINIC_OR_DEPARTMENT_OTHER): Payer: Self-pay

## 2025-01-04 ENCOUNTER — Other Ambulatory Visit: Payer: Self-pay | Admitting: Family

## 2025-01-04 MED ORDER — ALLOPURINOL 100 MG PO TABS
200.0000 mg | ORAL_TABLET | Freq: Every day | ORAL | 0 refills | Status: AC
  Filled 2025-01-04: qty 180, 90d supply, fill #0

## 2025-02-07 ENCOUNTER — Ambulatory Visit
